# Patient Record
Sex: Female | Born: 1935 | Race: White | Hispanic: No | Smoking: Former smoker
Health system: Southern US, Community
[De-identification: ages and names within clinical notes are randomized; demographics above are authoritative.]

## PROBLEM LIST (undated history)

## (undated) ENCOUNTER — Emergency Department (HOSPITAL_COMMUNITY): Admission: EM | Payer: 59 | Source: Home / Self Care

## (undated) DIAGNOSIS — I480 Paroxysmal atrial fibrillation: Secondary | ICD-10-CM

## (undated) DIAGNOSIS — D649 Anemia, unspecified: Secondary | ICD-10-CM

## (undated) DIAGNOSIS — F0390 Unspecified dementia without behavioral disturbance: Secondary | ICD-10-CM

## (undated) DIAGNOSIS — T4145XA Adverse effect of unspecified anesthetic, initial encounter: Secondary | ICD-10-CM

## (undated) DIAGNOSIS — I10 Essential (primary) hypertension: Secondary | ICD-10-CM

## (undated) DIAGNOSIS — K5520 Angiodysplasia of colon without hemorrhage: Secondary | ICD-10-CM

## (undated) DIAGNOSIS — J9610 Chronic respiratory failure, unspecified whether with hypoxia or hypercapnia: Secondary | ICD-10-CM

## (undated) DIAGNOSIS — K219 Gastro-esophageal reflux disease without esophagitis: Secondary | ICD-10-CM

## (undated) DIAGNOSIS — J449 Chronic obstructive pulmonary disease, unspecified: Secondary | ICD-10-CM

## (undated) DIAGNOSIS — I6529 Occlusion and stenosis of unspecified carotid artery: Secondary | ICD-10-CM

## (undated) DIAGNOSIS — E119 Type 2 diabetes mellitus without complications: Secondary | ICD-10-CM

## (undated) DIAGNOSIS — I2699 Other pulmonary embolism without acute cor pulmonale: Secondary | ICD-10-CM

## (undated) DIAGNOSIS — T8859XA Other complications of anesthesia, initial encounter: Secondary | ICD-10-CM

## (undated) DIAGNOSIS — Z720 Tobacco use: Secondary | ICD-10-CM

## (undated) DIAGNOSIS — I5032 Chronic diastolic (congestive) heart failure: Secondary | ICD-10-CM

## (undated) DIAGNOSIS — K922 Gastrointestinal hemorrhage, unspecified: Secondary | ICD-10-CM

## (undated) DIAGNOSIS — E785 Hyperlipidemia, unspecified: Secondary | ICD-10-CM

## (undated) DIAGNOSIS — K5790 Diverticulosis of intestine, part unspecified, without perforation or abscess without bleeding: Secondary | ICD-10-CM

## (undated) DIAGNOSIS — R112 Nausea with vomiting, unspecified: Secondary | ICD-10-CM

## (undated) DIAGNOSIS — Z9889 Other specified postprocedural states: Secondary | ICD-10-CM

## (undated) DIAGNOSIS — T7840XA Allergy, unspecified, initial encounter: Secondary | ICD-10-CM

## (undated) HISTORY — DX: Tobacco use: Z72.0

## (undated) HISTORY — DX: Unspecified dementia, unspecified severity, without behavioral disturbance, psychotic disturbance, mood disturbance, and anxiety: F03.90

## (undated) HISTORY — DX: Essential (primary) hypertension: I10

## (undated) HISTORY — DX: Occlusion and stenosis of unspecified carotid artery: I65.29

## (undated) HISTORY — DX: Hyperlipidemia, unspecified: E78.5

## (undated) HISTORY — DX: Allergy, unspecified, initial encounter: T78.40XA

## (undated) HISTORY — DX: Diverticulosis of intestine, part unspecified, without perforation or abscess without bleeding: K57.90

---

## 1989-03-01 HISTORY — PX: CARDIAC CATHETERIZATION: SHX172

## 1997-11-01 ENCOUNTER — Ambulatory Visit (HOSPITAL_COMMUNITY): Admission: RE | Admit: 1997-11-01 | Discharge: 1997-11-01 | Payer: Self-pay | Admitting: *Deleted

## 1998-04-11 ENCOUNTER — Other Ambulatory Visit: Admission: RE | Admit: 1998-04-11 | Discharge: 1998-04-11 | Payer: Self-pay | Admitting: Family Medicine

## 1999-05-15 ENCOUNTER — Other Ambulatory Visit: Admission: RE | Admit: 1999-05-15 | Discharge: 1999-05-15 | Payer: Self-pay | Admitting: Family Medicine

## 2000-05-29 ENCOUNTER — Encounter: Payer: Self-pay | Admitting: Family Medicine

## 2000-05-29 ENCOUNTER — Encounter: Admission: RE | Admit: 2000-05-29 | Discharge: 2000-05-29 | Payer: Self-pay | Admitting: Family Medicine

## 2000-07-02 ENCOUNTER — Other Ambulatory Visit: Admission: RE | Admit: 2000-07-02 | Discharge: 2000-07-02 | Payer: Self-pay | Admitting: Family Medicine

## 2001-07-14 ENCOUNTER — Encounter: Admission: RE | Admit: 2001-07-14 | Discharge: 2001-07-14 | Payer: Self-pay | Admitting: Family Medicine

## 2001-07-14 ENCOUNTER — Encounter: Payer: Self-pay | Admitting: Family Medicine

## 2002-09-30 LAB — HM MAMMOGRAPHY

## 2002-10-06 ENCOUNTER — Encounter: Payer: Self-pay | Admitting: Family Medicine

## 2002-10-06 ENCOUNTER — Encounter: Admission: RE | Admit: 2002-10-06 | Discharge: 2002-10-06 | Payer: Self-pay | Admitting: Family Medicine

## 2003-09-13 ENCOUNTER — Encounter: Payer: Self-pay | Admitting: Family Medicine

## 2003-09-13 ENCOUNTER — Other Ambulatory Visit: Admission: RE | Admit: 2003-09-13 | Discharge: 2003-09-13 | Payer: Self-pay | Admitting: Family Medicine

## 2003-09-13 LAB — CONVERTED CEMR LAB: Pap Smear: NORMAL

## 2003-11-13 LAB — FECAL OCCULT BLOOD, GUAIAC: Fecal Occult Blood: NEGATIVE

## 2004-05-01 HISTORY — PX: ORIF TIBIA & FIBULA FRACTURES: SHX2131

## 2004-05-07 ENCOUNTER — Ambulatory Visit: Payer: Self-pay | Admitting: Internal Medicine

## 2004-05-07 ENCOUNTER — Inpatient Hospital Stay (HOSPITAL_COMMUNITY): Admission: EM | Admit: 2004-05-07 | Discharge: 2004-05-16 | Payer: Self-pay | Admitting: Emergency Medicine

## 2004-05-08 ENCOUNTER — Ambulatory Visit: Payer: Self-pay | Admitting: Internal Medicine

## 2004-05-09 ENCOUNTER — Ambulatory Visit: Payer: Self-pay | Admitting: Specialist

## 2004-05-10 ENCOUNTER — Encounter: Payer: Self-pay | Admitting: Internal Medicine

## 2004-06-11 ENCOUNTER — Ambulatory Visit: Payer: Self-pay | Admitting: Family Medicine

## 2004-06-14 ENCOUNTER — Ambulatory Visit: Payer: Self-pay | Admitting: Cardiology

## 2004-06-18 ENCOUNTER — Ambulatory Visit: Payer: Self-pay | Admitting: Family Medicine

## 2004-06-26 ENCOUNTER — Ambulatory Visit: Payer: Self-pay | Admitting: Family Medicine

## 2004-07-03 ENCOUNTER — Ambulatory Visit: Payer: Self-pay | Admitting: Family Medicine

## 2004-07-10 ENCOUNTER — Ambulatory Visit: Payer: Self-pay | Admitting: Family Medicine

## 2004-07-17 ENCOUNTER — Ambulatory Visit: Payer: Self-pay | Admitting: Family Medicine

## 2004-07-31 ENCOUNTER — Ambulatory Visit: Payer: Self-pay | Admitting: Family Medicine

## 2004-08-14 ENCOUNTER — Ambulatory Visit: Payer: Self-pay | Admitting: Family Medicine

## 2004-08-16 ENCOUNTER — Ambulatory Visit: Payer: Self-pay | Admitting: Family Medicine

## 2004-08-28 ENCOUNTER — Ambulatory Visit: Payer: Self-pay | Admitting: Family Medicine

## 2004-08-29 HISTORY — PX: ESOPHAGOGASTRODUODENOSCOPY: SHX1529

## 2004-08-29 HISTORY — PX: COLONOSCOPY: SHX174

## 2004-09-06 ENCOUNTER — Ambulatory Visit: Payer: Self-pay | Admitting: Internal Medicine

## 2004-09-17 ENCOUNTER — Ambulatory Visit: Payer: Self-pay | Admitting: Cardiology

## 2004-09-18 ENCOUNTER — Ambulatory Visit: Payer: Self-pay | Admitting: Internal Medicine

## 2004-09-25 ENCOUNTER — Ambulatory Visit: Payer: Self-pay | Admitting: Family Medicine

## 2004-09-28 ENCOUNTER — Ambulatory Visit: Payer: Self-pay

## 2004-10-09 ENCOUNTER — Ambulatory Visit: Payer: Self-pay | Admitting: Family Medicine

## 2004-10-10 ENCOUNTER — Ambulatory Visit: Payer: Self-pay | Admitting: Internal Medicine

## 2004-11-06 ENCOUNTER — Ambulatory Visit: Payer: Self-pay | Admitting: Family Medicine

## 2004-11-09 ENCOUNTER — Ambulatory Visit: Payer: Self-pay | Admitting: Internal Medicine

## 2004-11-20 ENCOUNTER — Ambulatory Visit: Payer: Self-pay | Admitting: Family Medicine

## 2004-11-27 ENCOUNTER — Ambulatory Visit (HOSPITAL_COMMUNITY): Admission: RE | Admit: 2004-11-27 | Discharge: 2004-11-27 | Payer: Self-pay | Admitting: Family Medicine

## 2004-11-27 ENCOUNTER — Ambulatory Visit: Payer: Self-pay | Admitting: Family Medicine

## 2004-11-28 ENCOUNTER — Ambulatory Visit (HOSPITAL_COMMUNITY): Admission: RE | Admit: 2004-11-28 | Discharge: 2004-11-28 | Payer: Self-pay | Admitting: Family Medicine

## 2004-12-24 ENCOUNTER — Ambulatory Visit: Payer: Self-pay | Admitting: Family Medicine

## 2005-01-21 ENCOUNTER — Ambulatory Visit: Payer: Self-pay | Admitting: Family Medicine

## 2005-02-04 ENCOUNTER — Ambulatory Visit: Payer: Self-pay | Admitting: Family Medicine

## 2005-02-11 ENCOUNTER — Ambulatory Visit: Payer: Self-pay | Admitting: Family Medicine

## 2005-02-26 ENCOUNTER — Ambulatory Visit: Payer: Self-pay | Admitting: Internal Medicine

## 2005-03-01 HISTORY — PX: SMALL BOWEL ENTEROSCOPY: SHX2415

## 2005-03-05 ENCOUNTER — Ambulatory Visit (HOSPITAL_COMMUNITY): Admission: RE | Admit: 2005-03-05 | Discharge: 2005-03-05 | Payer: Self-pay | Admitting: Internal Medicine

## 2005-03-05 ENCOUNTER — Ambulatory Visit: Payer: Self-pay | Admitting: Internal Medicine

## 2005-03-11 ENCOUNTER — Ambulatory Visit: Payer: Self-pay | Admitting: Family Medicine

## 2005-03-14 ENCOUNTER — Ambulatory Visit (HOSPITAL_COMMUNITY): Admission: RE | Admit: 2005-03-14 | Discharge: 2005-03-14 | Payer: Self-pay | Admitting: Internal Medicine

## 2005-03-28 ENCOUNTER — Ambulatory Visit: Payer: Self-pay | Admitting: Cardiology

## 2005-05-02 ENCOUNTER — Ambulatory Visit: Payer: Self-pay | Admitting: Family Medicine

## 2005-10-08 ENCOUNTER — Ambulatory Visit: Payer: Self-pay | Admitting: Cardiology

## 2005-10-08 ENCOUNTER — Ambulatory Visit: Payer: Self-pay

## 2005-10-10 ENCOUNTER — Ambulatory Visit (HOSPITAL_COMMUNITY): Admission: RE | Admit: 2005-10-10 | Discharge: 2005-10-10 | Payer: Self-pay | Admitting: Cardiology

## 2005-11-20 ENCOUNTER — Ambulatory Visit: Payer: Self-pay | Admitting: Family Medicine

## 2006-01-02 ENCOUNTER — Ambulatory Visit: Payer: Self-pay | Admitting: Family Medicine

## 2006-04-01 ENCOUNTER — Ambulatory Visit: Payer: Self-pay | Admitting: Family Medicine

## 2006-05-26 ENCOUNTER — Ambulatory Visit: Payer: Self-pay | Admitting: Family Medicine

## 2006-05-28 ENCOUNTER — Ambulatory Visit: Payer: Self-pay | Admitting: Family Medicine

## 2006-08-27 ENCOUNTER — Ambulatory Visit: Payer: Self-pay | Admitting: Family Medicine

## 2006-08-27 LAB — CONVERTED CEMR LAB
Basophils Absolute: 0 10*3/uL (ref 0.0–0.1)
Eosinophils Absolute: 0 10*3/uL (ref 0.0–0.6)
Lymphocytes Relative: 31.7 % (ref 12.0–46.0)
MCHC: 34.2 g/dL (ref 30.0–36.0)
MCV: 89.4 fL (ref 78.0–100.0)
Neutro Abs: 3.9 10*3/uL (ref 1.4–7.7)
Neutrophils Relative %: 58.2 % (ref 43.0–77.0)
Platelets: 433 10*3/uL — ABNORMAL HIGH (ref 150–400)
RBC: 4.28 M/uL (ref 3.87–5.11)

## 2006-09-05 ENCOUNTER — Ambulatory Visit: Payer: Self-pay | Admitting: Family Medicine

## 2006-09-12 ENCOUNTER — Ambulatory Visit: Payer: Self-pay | Admitting: Family Medicine

## 2006-09-12 LAB — CONVERTED CEMR LAB
BUN: 12 mg/dL (ref 6–23)
Creatinine, Ser: 0.8 mg/dL (ref 0.4–1.2)

## 2006-09-16 ENCOUNTER — Ambulatory Visit: Payer: Self-pay | Admitting: Cardiology

## 2006-09-26 ENCOUNTER — Ambulatory Visit: Payer: Self-pay | Admitting: Family Medicine

## 2006-10-15 ENCOUNTER — Ambulatory Visit: Payer: Self-pay

## 2006-11-25 ENCOUNTER — Ambulatory Visit: Payer: Self-pay | Admitting: Family Medicine

## 2006-11-25 DIAGNOSIS — E7800 Pure hypercholesterolemia, unspecified: Secondary | ICD-10-CM

## 2006-11-27 LAB — CONVERTED CEMR LAB
ALT: 11 units/L (ref 0–40)
Hgb A1c MFr Bld: 6.5 % — ABNORMAL HIGH (ref 4.6–6.0)
Total CHOL/HDL Ratio: 2.8
VLDL: 16 mg/dL (ref 0–40)

## 2007-05-08 ENCOUNTER — Ambulatory Visit: Payer: Self-pay | Admitting: Family Medicine

## 2007-10-22 ENCOUNTER — Encounter: Payer: Self-pay | Admitting: Family Medicine

## 2007-10-22 ENCOUNTER — Ambulatory Visit: Payer: Self-pay

## 2007-11-03 ENCOUNTER — Encounter: Payer: Self-pay | Admitting: Family Medicine

## 2007-11-03 DIAGNOSIS — I10 Essential (primary) hypertension: Secondary | ICD-10-CM | POA: Insufficient documentation

## 2007-11-03 DIAGNOSIS — G5760 Lesion of plantar nerve, unspecified lower limb: Secondary | ICD-10-CM | POA: Insufficient documentation

## 2007-11-03 DIAGNOSIS — I480 Paroxysmal atrial fibrillation: Secondary | ICD-10-CM

## 2007-11-03 DIAGNOSIS — H3530 Unspecified macular degeneration: Secondary | ICD-10-CM | POA: Insufficient documentation

## 2007-11-03 DIAGNOSIS — J309 Allergic rhinitis, unspecified: Secondary | ICD-10-CM

## 2007-11-03 DIAGNOSIS — E119 Type 2 diabetes mellitus without complications: Secondary | ICD-10-CM

## 2007-11-03 DIAGNOSIS — M810 Age-related osteoporosis without current pathological fracture: Secondary | ICD-10-CM

## 2007-11-04 ENCOUNTER — Ambulatory Visit: Payer: Self-pay | Admitting: Family Medicine

## 2007-11-04 DIAGNOSIS — Z87891 Personal history of nicotine dependence: Secondary | ICD-10-CM

## 2007-11-09 LAB — CONVERTED CEMR LAB
ALT: 16 units/L (ref 0–35)
AST: 15 units/L (ref 0–37)
Albumin: 4.3 g/dL (ref 3.5–5.2)
Alkaline Phosphatase: 50 units/L (ref 39–117)
BUN: 18 mg/dL (ref 6–23)
Basophils Relative: 0.6 % (ref 0.0–1.0)
Bilirubin, Direct: 0.1 mg/dL (ref 0.0–0.3)
Calcium: 9.7 mg/dL (ref 8.4–10.5)
Chloride: 109 meq/L (ref 96–112)
Creatinine, Ser: 0.9 mg/dL (ref 0.4–1.2)
Eosinophils Relative: 0.9 % (ref 0.0–5.0)
GFR calc Af Amer: 78 mL/min
GFR calc non Af Amer: 65 mL/min
HDL: 57.6 mg/dL (ref >39.0)
Lymphocytes Relative: 33.2 % (ref 12.0–46.0)
MCV: 90.6 fL (ref 78.0–100.0)
Microalb Creat Ratio: 11.6 mg/g (ref 0.0–30.0)
Monocytes Relative: 5.3 % (ref 3.0–12.0)
Neutrophils Relative %: 60 % (ref 43.0–77.0)
Platelets: 190 10*3/uL (ref 150–400)
RBC: 4.19 M/uL (ref 3.87–5.11)
Total CHOL/HDL Ratio: 2.8
Total Protein: 6.8 g/dL (ref 6.0–8.3)
VLDL: 18 mg/dL (ref 0–40)
WBC: 7.5 10*3/uL (ref 4.5–10.5)

## 2008-03-28 ENCOUNTER — Ambulatory Visit: Payer: Self-pay | Admitting: Family Medicine

## 2008-05-10 ENCOUNTER — Ambulatory Visit: Payer: Self-pay | Admitting: Family Medicine

## 2008-05-11 LAB — CONVERTED CEMR LAB: Hgb A1c MFr Bld: 6.9 % — ABNORMAL HIGH (ref 4.6–6.0)

## 2008-08-09 ENCOUNTER — Ambulatory Visit: Payer: Self-pay | Admitting: Family Medicine

## 2008-08-10 LAB — CONVERTED CEMR LAB
AST: 16 units/L (ref 0–37)
BUN: 14 mg/dL (ref 6–23)
Calcium: 9.2 mg/dL (ref 8.4–10.5)
Chloride: 106 meq/L (ref 96–112)
Cholesterol: 154 mg/dL (ref 0–200)
Creatinine, Ser: 0.8 mg/dL (ref 0.4–1.2)
Glucose, Bld: 126 mg/dL — ABNORMAL HIGH (ref 70–99)
Hgb A1c MFr Bld: 6.9 % — ABNORMAL HIGH (ref 4.6–6.0)
LDL Cholesterol: 81 mg/dL (ref 0–99)
Potassium: 3.9 meq/L (ref 3.5–5.1)
Triglycerides: 69 mg/dL (ref 0–149)
VLDL: 14 mg/dL (ref 0–40)

## 2008-10-25 ENCOUNTER — Ambulatory Visit: Payer: Self-pay

## 2008-10-27 ENCOUNTER — Encounter: Payer: Self-pay | Admitting: Family Medicine

## 2008-11-29 ENCOUNTER — Ambulatory Visit: Payer: Self-pay | Admitting: Cardiology

## 2008-11-29 ENCOUNTER — Inpatient Hospital Stay (HOSPITAL_COMMUNITY): Admission: EM | Admit: 2008-11-29 | Discharge: 2008-12-19 | Payer: Self-pay | Admitting: Emergency Medicine

## 2008-11-30 ENCOUNTER — Encounter: Payer: Self-pay | Admitting: Family Medicine

## 2008-11-30 ENCOUNTER — Ambulatory Visit: Payer: Self-pay | Admitting: Pulmonary Disease

## 2008-12-01 ENCOUNTER — Encounter (INDEPENDENT_AMBULATORY_CARE_PROVIDER_SITE_OTHER): Payer: Self-pay | Admitting: Orthopedic Surgery

## 2008-12-05 ENCOUNTER — Encounter (INDEPENDENT_AMBULATORY_CARE_PROVIDER_SITE_OTHER): Payer: Self-pay | Admitting: Internal Medicine

## 2008-12-05 ENCOUNTER — Ambulatory Visit: Payer: Self-pay | Admitting: Vascular Surgery

## 2008-12-08 ENCOUNTER — Encounter: Payer: Self-pay | Admitting: Family Medicine

## 2008-12-08 ENCOUNTER — Encounter: Payer: Self-pay | Admitting: Internal Medicine

## 2008-12-12 ENCOUNTER — Ambulatory Visit: Payer: Self-pay | Admitting: Infectious Disease

## 2008-12-12 ENCOUNTER — Encounter: Payer: Self-pay | Admitting: Family Medicine

## 2008-12-15 ENCOUNTER — Telehealth: Payer: Self-pay | Admitting: Family Medicine

## 2009-01-10 ENCOUNTER — Ambulatory Visit (HOSPITAL_COMMUNITY): Admission: RE | Admit: 2009-01-10 | Discharge: 2009-01-10 | Payer: Self-pay | Admitting: Internal Medicine

## 2009-01-23 ENCOUNTER — Ambulatory Visit: Payer: Self-pay | Admitting: Cardiology

## 2009-01-24 ENCOUNTER — Ambulatory Visit: Payer: Self-pay | Admitting: Family Medicine

## 2009-01-24 DIAGNOSIS — D649 Anemia, unspecified: Secondary | ICD-10-CM | POA: Insufficient documentation

## 2009-01-26 LAB — CONVERTED CEMR LAB
Basophils Relative: 0 % (ref 0.0–3.0)
Chloride: 104 meq/L (ref 96–112)
Eosinophils Relative: 0.8 % (ref 0.0–5.0)
Glucose, Bld: 232 mg/dL — ABNORMAL HIGH (ref 70–99)
HCT: 35.1 % — ABNORMAL LOW (ref 36.0–46.0)
Hemoglobin: 11.9 g/dL — ABNORMAL LOW (ref 12.0–15.0)
Lymphocytes Relative: 16.9 % (ref 12.0–46.0)
Monocytes Relative: 4.9 % (ref 3.0–12.0)
Neutro Abs: 5.5 10*3/uL (ref 1.4–7.7)
Phosphorus: 3.7 mg/dL (ref 2.3–4.6)
Potassium: 4.4 meq/L (ref 3.5–5.1)
RBC: 3.75 M/uL — ABNORMAL LOW (ref 3.87–5.11)
Sodium: 141 meq/L (ref 135–145)

## 2009-02-22 ENCOUNTER — Encounter: Payer: Self-pay | Admitting: Family Medicine

## 2010-04-06 IMAGING — CT CT CHEST W/O CM
2 of 4 series · 15 of 36 positions shown, 18 images · non-contrast
Comparison: Chest radiographs 12/07/2008 and chest CTA 12/04/2008.

CLINICAL DATA: Inability to wean from ventilator.  Follow-up
bilateral infiltrates.

CT CHEST WITHOUT CONTRAST
TECHNIQUE: Multidetector CT imaging of the chest was performed
following the standard protocol without IV contrast.

[Series 3: routine chest 5.0 st · axial · 0.60mm/px · z∈[-525,-252]mm · 12 of 66 slices shown, 15 images]
[im 5/66  mediastinal]
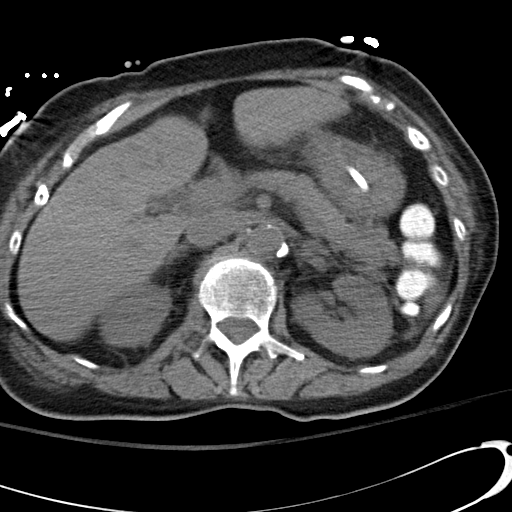
[im 5/66  lung]
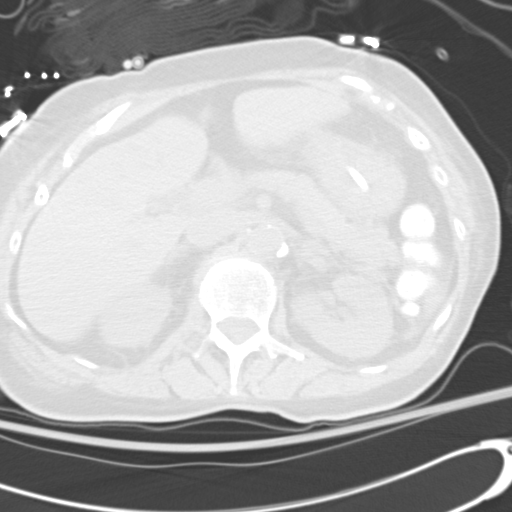
[im 10/66  lung]
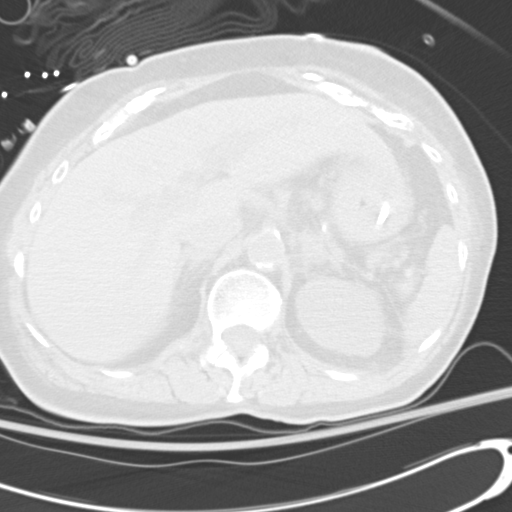
[im 14/66  lung]
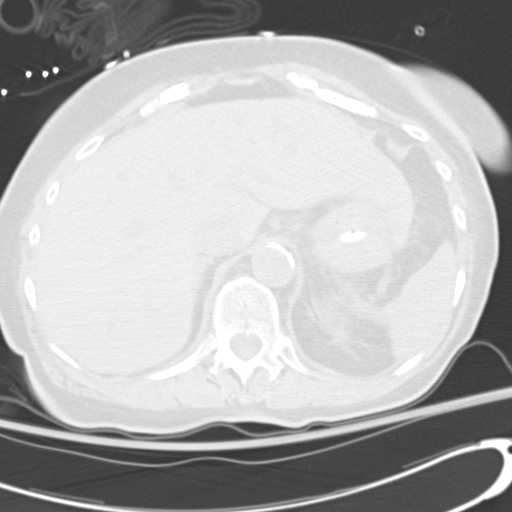
[im 19/66  lung]
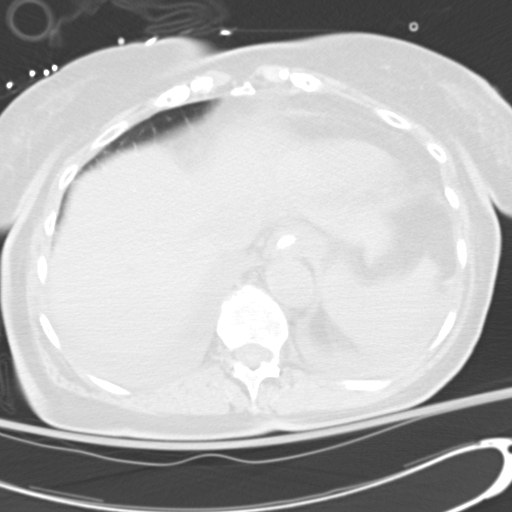
[im 24/66  mediastinal]
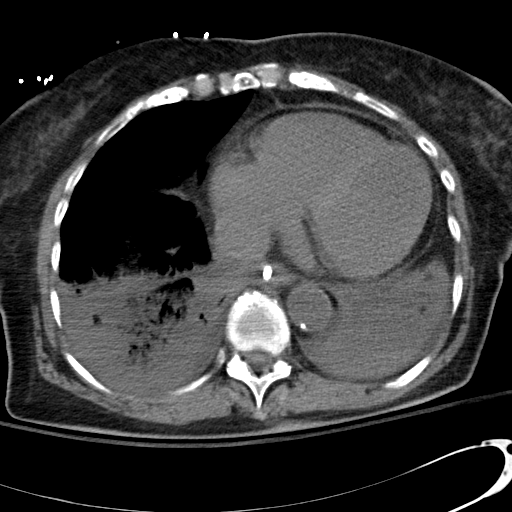
[im 24/66  lung]
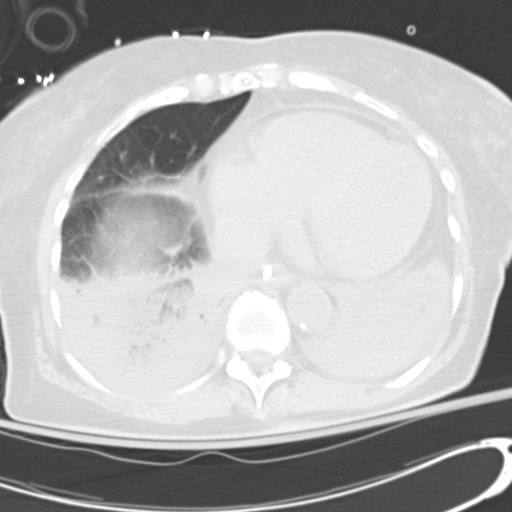
[im 28/66  lung]
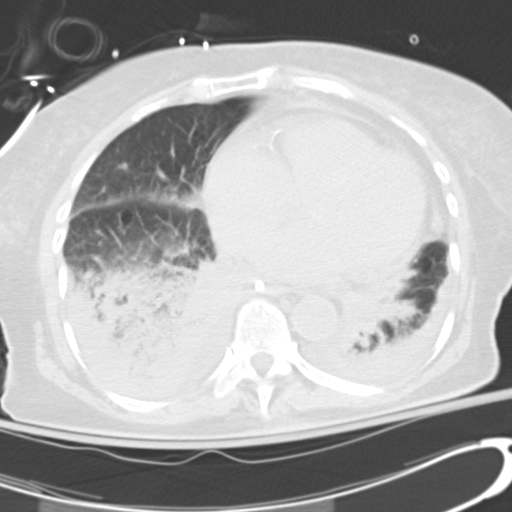
[im 38/66  lung]
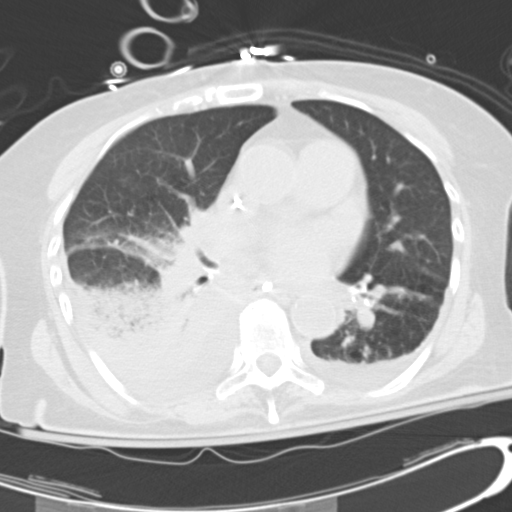
[im 42/66  lung]
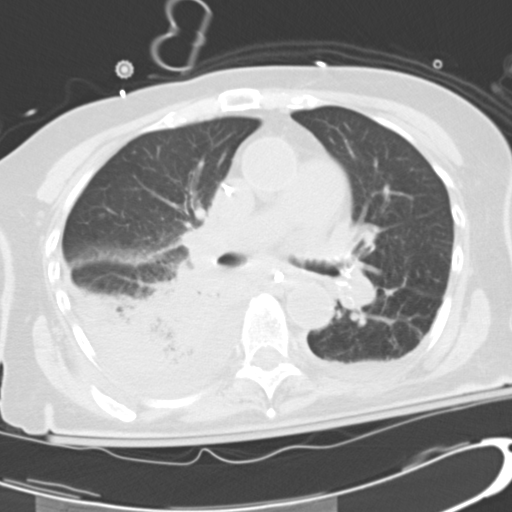
[im 47/66  mediastinal]
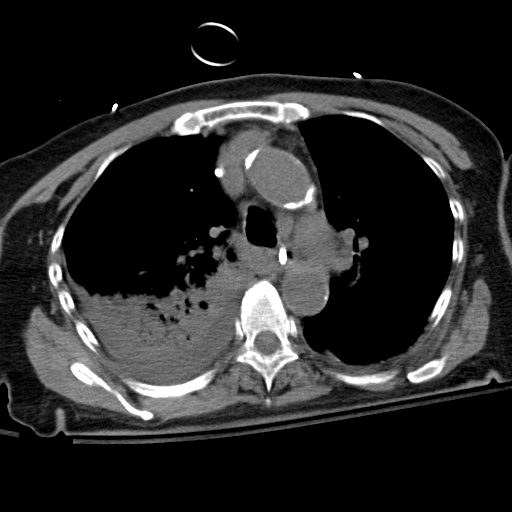
[im 47/66  lung]
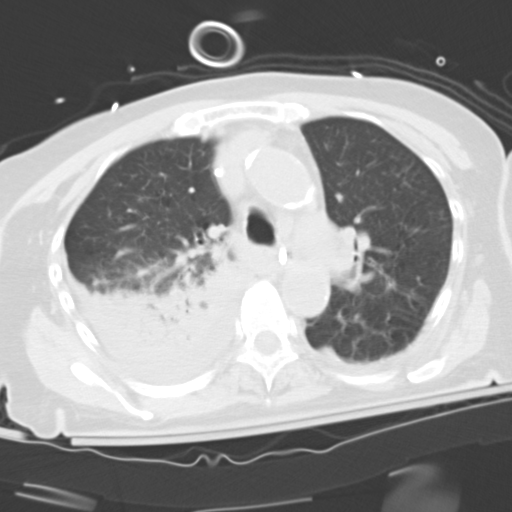
[im 52/66  lung]
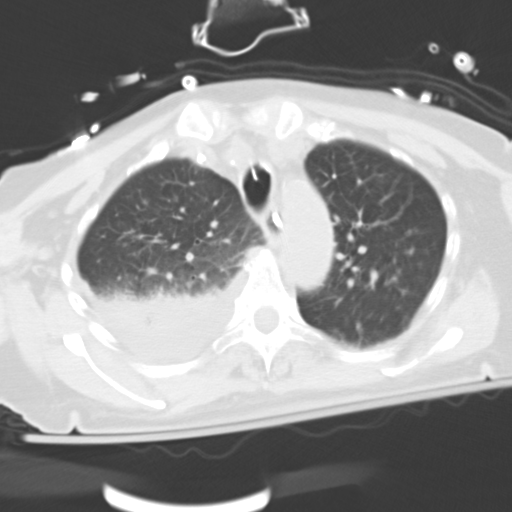
[im 56/66  lung]
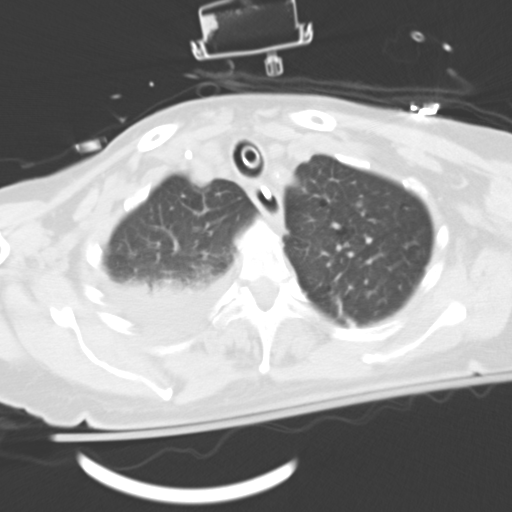
[im 61/66  lung]
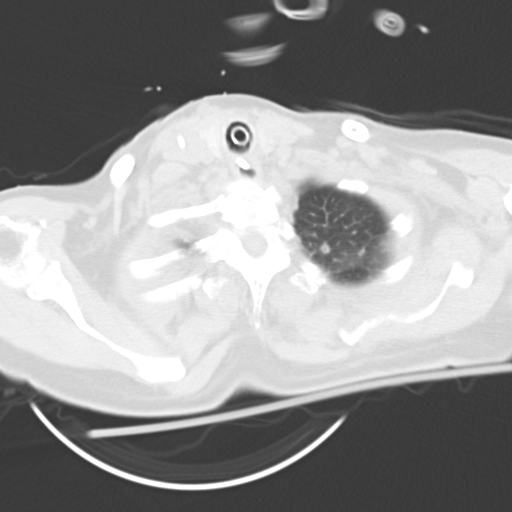

[Series 602: coronal chest · coronal · 0.60mm/px · 3 of 69 slices shown]
[im 14/69  lung]
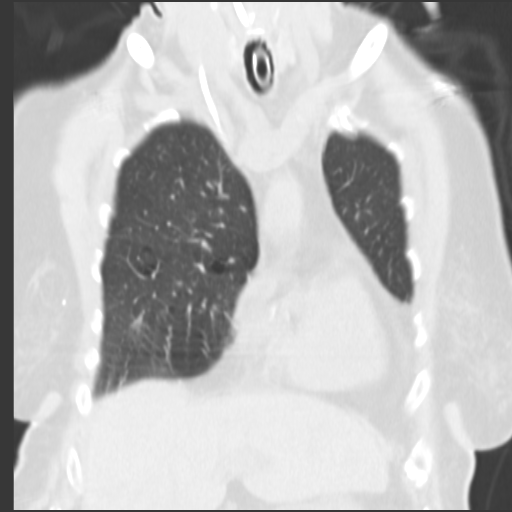
[im 28/69  lung]
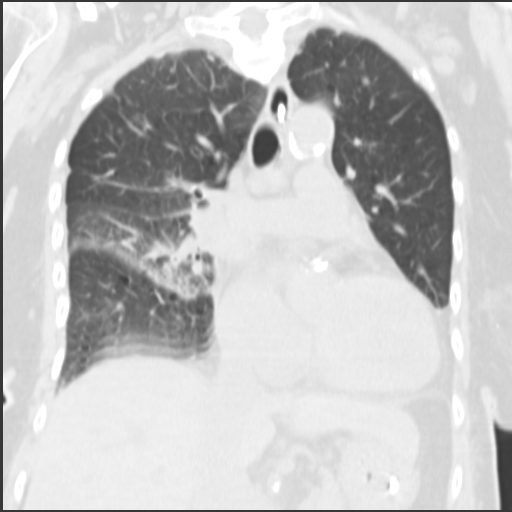
[im 41/69  lung]
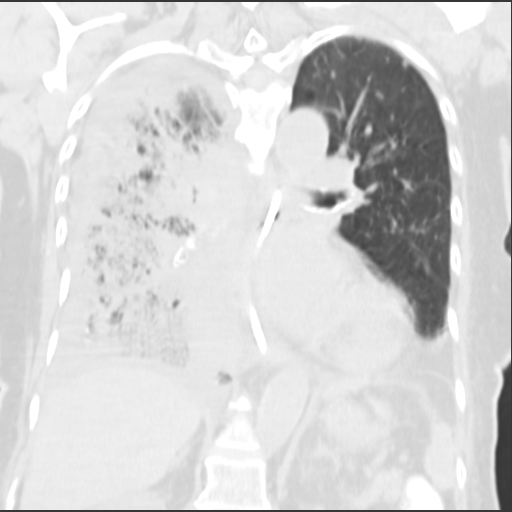

[15 of 36 positions shown; findings below may reference images not displayed]

FINDINGS: Right IJ central venous catheter projects to the SVC
right atrial junction.  Endotracheal tube tip is in the mid
trachea.  A feeding tube projects beyond the distal stomach, tip
not visualized.

13 mm precarinal lymph node is better seen on the current
examination due to the lack of beam hardening artifact from
contrast in the SVC.  It appears unchanged from the prior
examination.  Subcarinal nodal tissue appears stable.  There are
coronary artery calcifications.  Small right greater than left
pleural effusions and a tiny pericardial effusion appear stable.
There is no pneumothorax.

There has been little change in dense right lower lobe
consolidation aside from increased lucency in the consolidated lung
which may reflect reaeration or necrosis.  Minimal patchy opacity
peripherally in the right upper lobe on image 13 appears stable.
Dependent opacity in the left lower lobe appears stable.  No
endobronchial lesion is apparent.

Images through the upper abdomen demonstrate no adrenal mass.
There is an 11 mm low density lesion medially in the right kidney
which has not been previously imaged.  This is probably a cyst.
IMPRESSION: 1.  Persistent dense right lower lobe consolidation consistent with
pneumonia.  Small lucencies in the consolidated lung have slightly
increased and may reflect re-aeration or necrosis.  There is no
obvious lung abscess.
2.  No significant change in small right greater than left pleural
effusions.
3.  Nonspecific precarinal and subcarinal lymph nodes are unchanged
from the recent study and may be reactive.
4.  The support system appears adequately positioned.

## 2010-04-08 IMAGING — CR DG ABD PORTABLE 1V
1 series · 1 of 1 positions shown · non-contrast
Comparison: 12/10/2008

CLINICAL DATA: Evaluate feeding tube placement

ABDOMEN - 1 VIEW

[AP]
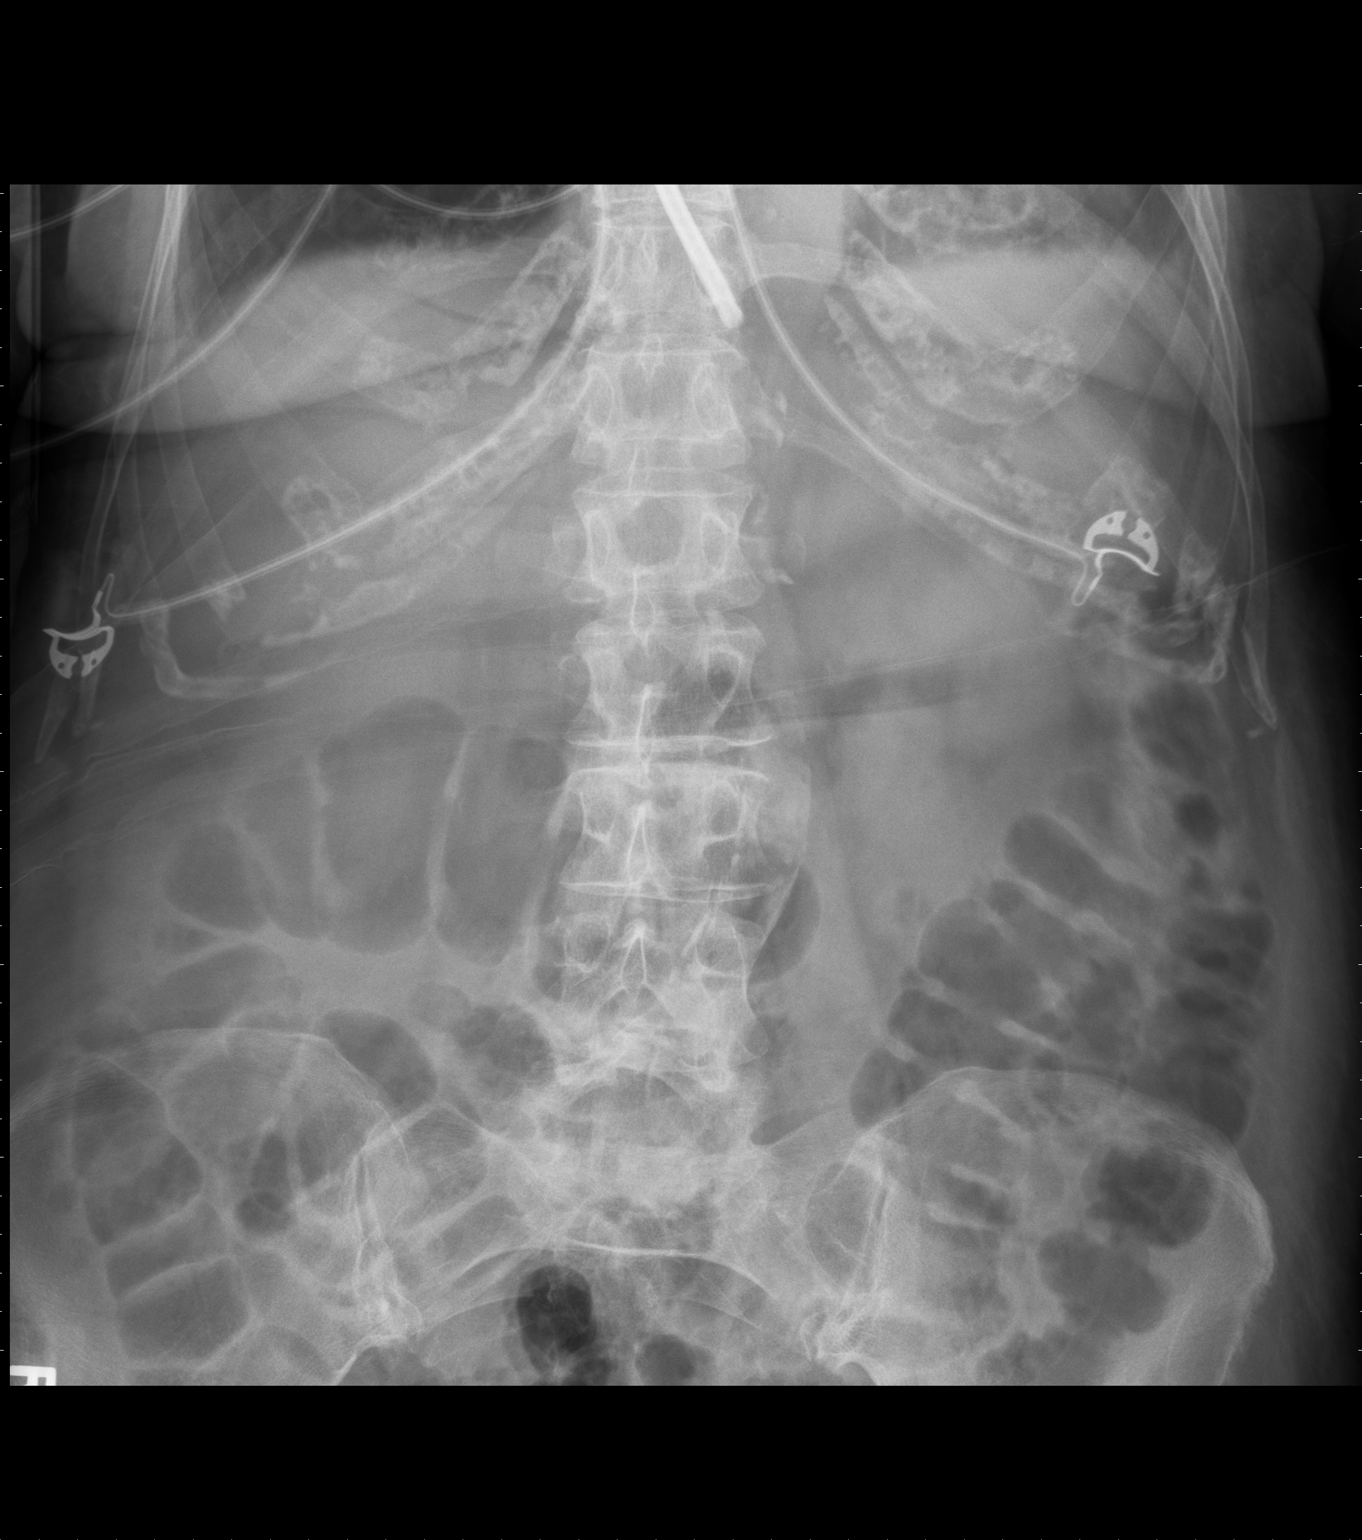

[1 of 1 positions shown; findings below may reference images not displayed]

FINDINGS: The feeding tube tip traverses the gastroesophageal
junction and should be advanced into the stomach.

There is gaseous distention of the large and small bowel loops.
There are no specific features to suggest bowel obstruction.
IMPRESSION: 1.  Feeding tube remains under advanced at the level of the GE
junction.

## 2010-04-09 IMAGING — CR DG CHEST 1V PORT
1 series · 1 of 1 positions shown · non-contrast
Comparison: 12/10/2008

CLINICAL DATA: Follow-up lung status

PORTABLE CHEST - 1 VIEW

[AP]
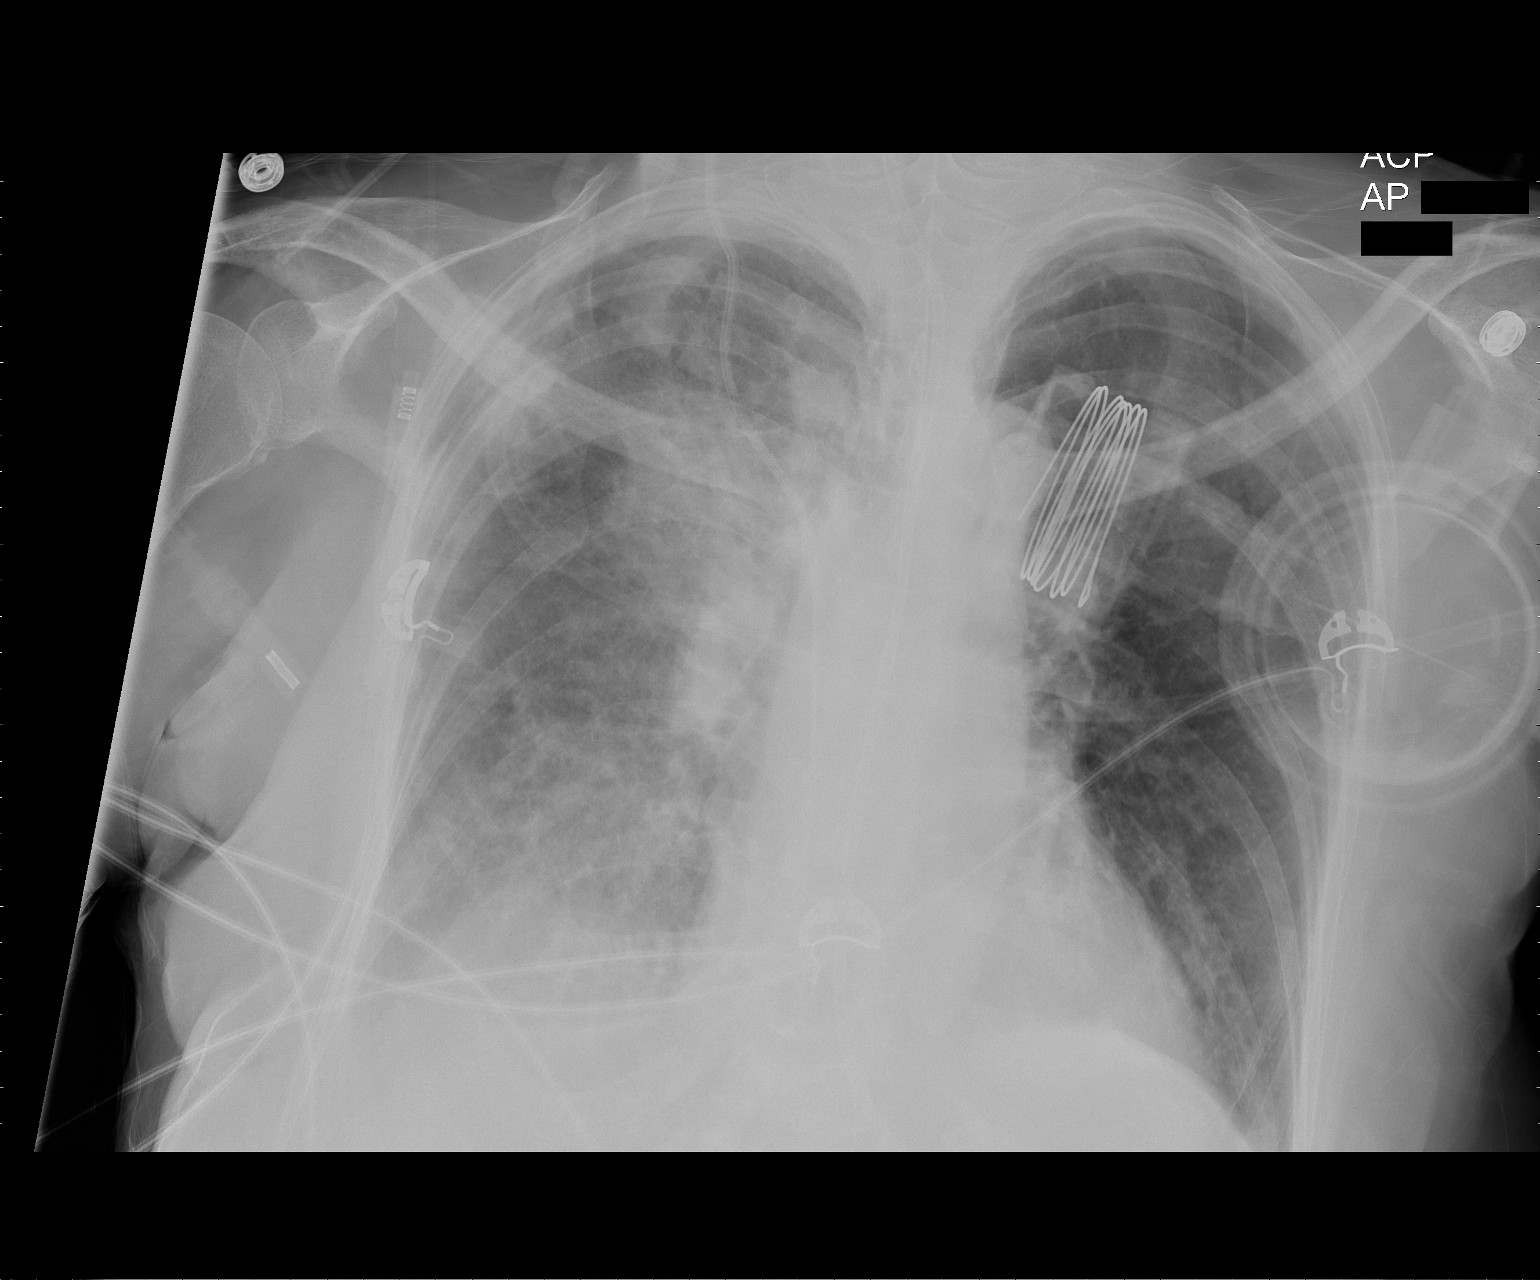

[1 of 1 positions shown; findings below may reference images not displayed]

FINDINGS: Asymmetrical density involving right lung is again
appreciated.  Interval increase in density in the medial aspect the
right upper lobe.
IMPRESSION: Increased density in the medial aspect right upper lobe.

## 2010-05-04 ENCOUNTER — Encounter (INDEPENDENT_AMBULATORY_CARE_PROVIDER_SITE_OTHER): Payer: Self-pay | Admitting: *Deleted

## 2010-05-23 ENCOUNTER — Telehealth: Payer: Self-pay | Admitting: Family Medicine

## 2010-07-11 ENCOUNTER — Ambulatory Visit
Admission: RE | Admit: 2010-07-11 | Discharge: 2010-07-11 | Payer: Self-pay | Source: Home / Self Care | Attending: Family Medicine | Admitting: Family Medicine

## 2010-07-11 ENCOUNTER — Other Ambulatory Visit: Payer: Self-pay | Admitting: Family Medicine

## 2010-07-11 ENCOUNTER — Other Ambulatory Visit
Admission: RE | Admit: 2010-07-11 | Discharge: 2010-07-11 | Payer: Self-pay | Source: Home / Self Care | Admitting: Family Medicine

## 2010-07-11 ENCOUNTER — Encounter: Payer: Self-pay | Admitting: Family Medicine

## 2010-07-11 LAB — CONVERTED CEMR LAB
Bacteria, UA: 0
Ketones, urine, test strip: NEGATIVE
Nitrite: NEGATIVE
Pap Smear: NORMAL
RBC / HPF: 0
Specific Gravity, Urine: <1.005
Urine crystals, microscopic: 0 /hpf
Urobilinogen, UA: 0.2

## 2010-07-11 LAB — HEPATIC FUNCTION PANEL
ALT: 12 U/L (ref 0–35)
AST: 18 U/L (ref 0–37)
Albumin: 4.4 g/dL (ref 3.5–5.2)
Alkaline Phosphatase: 45 U/L (ref 39–117)
Bilirubin, Direct: 0.1 mg/dL (ref 0.0–0.3)
Total Bilirubin: 0.5 mg/dL (ref 0.3–1.2)
Total Protein: 7.1 g/dL (ref 6.0–8.3)

## 2010-07-11 LAB — HM DIABETES FOOT EXAM

## 2010-07-11 LAB — RENAL FUNCTION PANEL
Albumin: 4.4 g/dL (ref 3.5–5.2)
BUN: 20 mg/dL (ref 6–23)
CO2: 30 mEq/L (ref 19–32)
Calcium: 9.1 mg/dL (ref 8.4–10.5)
Chloride: 104 mEq/L (ref 96–112)
Creatinine, Ser: 0.7 mg/dL (ref 0.4–1.2)
GFR: 80.56 mL/min (ref >60.00)
Glucose, Bld: 105 mg/dL — ABNORMAL HIGH (ref 70–99)
Phosphorus: 3.7 mg/dL (ref 2.3–4.6)
Potassium: 3.8 mEq/L (ref 3.5–5.1)
Sodium: 143 mEq/L (ref 135–145)

## 2010-07-11 LAB — CBC WITH DIFFERENTIAL/PLATELET
Basophils Absolute: 0 10*3/uL (ref 0.0–0.1)
Basophils Relative: 0.3 % (ref 0.0–3.0)
Eosinophils Absolute: 0 10*3/uL (ref 0.0–0.7)
Eosinophils Relative: 0.7 % (ref 0.0–5.0)
HCT: 41.4 % (ref 36.0–46.0)
Hemoglobin: 13.8 g/dL (ref 12.0–15.0)
Lymphocytes Relative: 36.7 % (ref 12.0–46.0)
Lymphs Abs: 2.5 10*3/uL (ref 0.7–4.0)
MCHC: 33.3 g/dL (ref 30.0–36.0)
MCV: 93.1 fl (ref 78.0–100.0)
Monocytes Absolute: 0.4 10*3/uL (ref 0.1–1.0)
Monocytes Relative: 5.7 % (ref 3.0–12.0)
Neutro Abs: 3.8 10*3/uL (ref 1.4–7.7)
Neutrophils Relative %: 56.6 % (ref 43.0–77.0)
Platelets: 167 10*3/uL (ref 150.0–400.0)
RBC: 4.45 Mil/uL (ref 3.87–5.11)
RDW: 13.1 % (ref 11.5–14.6)
WBC: 6.7 10*3/uL (ref 4.5–10.5)

## 2010-07-11 LAB — LIPID PANEL
Cholesterol: 177 mg/dL (ref 0–200)
HDL: 68.6 mg/dL (ref >39.00)
LDL Cholesterol: 95 mg/dL (ref 0–99)
Total CHOL/HDL Ratio: 3
Triglycerides: 67 mg/dL (ref 0.0–149.0)
VLDL: 13.4 mg/dL (ref 0.0–40.0)

## 2010-07-11 LAB — TSH: TSH: 1.11 u[IU]/mL (ref 0.35–5.50)

## 2010-07-11 LAB — HEMOGLOBIN A1C: Hgb A1c MFr Bld: 6.9 % — ABNORMAL HIGH (ref 4.6–6.5)

## 2010-07-14 DIAGNOSIS — E559 Vitamin D deficiency, unspecified: Secondary | ICD-10-CM | POA: Insufficient documentation

## 2010-07-19 ENCOUNTER — Ambulatory Visit
Admission: RE | Admit: 2010-07-19 | Discharge: 2010-07-19 | Payer: Self-pay | Source: Home / Self Care | Attending: Family Medicine | Admitting: Family Medicine

## 2010-07-19 ENCOUNTER — Encounter: Payer: Self-pay | Admitting: Family Medicine

## 2010-07-20 ENCOUNTER — Encounter: Payer: Self-pay | Admitting: Family Medicine

## 2010-07-20 ENCOUNTER — Encounter (INDEPENDENT_AMBULATORY_CARE_PROVIDER_SITE_OTHER): Payer: Self-pay | Admitting: *Deleted

## 2010-08-02 NOTE — Consult Note (Signed)
Summary: [facility]  [facility]   Imported By: [place] 08/01/2009 10:10:56  _____________________________________________________________________  External Attachment:    Type:   Image     Comment:   External Document

## 2010-08-02 NOTE — Miscellaneous (Signed)
Summary: Pap smear entry to flow sheet   Clinical Lists Changes  Observations: Added new observation of PAP SMEAR: normal (07/11/2010 8:26)      Preventive Care Screening  Pap Smear:    Date:  07/11/2010    Results:  normal

## 2010-08-02 NOTE — Miscellaneous (Addendum)
Summary: BONE DENSITY  Clinical Lists Changes  Orders: Added new Test order of T-Bone Densitometry [id number]) - Signed Added new Test order of T-Lumbar Vertebral Assessment [phone]) - Signed  Appended Document: BONE DENSITY  please pull old chart and return to me, thanks Chart is on your shelf in the in box. Thank you.Lewanda Rife LPN  August 14, 2010 5:09 PM   Clinical Lists Changes  Observations: Added new observation of BD LTFEMNECK: -2.6  (07/19/2010 18:58) Added new observation of BD L1-L4 T: -2.4  (07/19/2010 18:58) Added new observation of BONE DENSITY: osteoporosis std dev (07/19/2010 18:58)       Preventive Care Screening  T-score L femur:    Date:  07/19/2010    Results:  -2.6   T-score L-Spine:    Date:  07/19/2010    Results:  -2.4   Bone Density:    Date:  07/19/2010    Results:  osteoporosis std dev  Appended Document: BONE DENSITY please let ptknow that her dexa shows Osteoporosis- with slt improvement at spine and slight worsening at the hip will disc in detail at her follow up   Appended Document: BONE DENSITY Discussed today at her office visit with Dr. Milinda Antis.

## 2010-08-02 NOTE — Assessment & Plan Note (Signed)
Summary: CPX/CLE   Vital Signs:  Patient profile:   77 year old female Height:      65.75 inches Weight:      147 pounds BMI:     23.99 Temp:     97.4 degrees F oral Pulse rate:   72 / minute Pulse rhythm:   regular BP sitting:   128 / 76  (left arm) Cuff size:   regular  Vitals Entered By: Lewanda Rife LPN (07-29-2010 10:56 AM) CC: CPX  and frequency of urine LMP 1980   History of Present Illness: here for check up of chronic health problems and also to [facility] in [facility]n has urinary frequency  feels pretty good overall  lost her balance after she broke her ankle and now better  healed up good   wt is down 9 lb with bmi 23 not trying to loose wt but less appetite lately- does not know why  no new meds  has not been taking iron (but does stay cold all the time)  smoking status -- is still smoking 1/2 ppd -- is intent on quitting  is back at her own house - thinks she can quit now  wants to try chantix again   HT 128/76- well controlled  due for lipid check on statin and diet   DM due for check on glyburide her sugars have been good at home - sugars varies -- not entirely sure   OP- ? last dexa- due for one  on fosamax- over 5 years  ca and D-- has been taking that   pneumovax 07 Td 05 flu shot-- [facility] one at [facility] -- 9/25 zoster status-- had a shingle vaccine about 4 years ago -- it was at another office   colonosc ? 06 with BE and small bowel ft-- with AVMs  is having urinary frequency -- has to go all the time - feels like bladder has dropped no burning or odor  has been leaking and wearing a pad  urgency to go  no leaking with cough or sneeze   mam was 04  does not want those any more    Allergies: 1)  ! * ? Morphine 2)  ! * ? Fosamax 3)  ! Zithromax  Past History:  Past Medical History: Last updated: 01/24/2009 Carotid stenosis Hyperlipidemia Allergic rhinitis Atrial fibrillation Diabetes mellitus, type  II Hypertension Osteoporosis Tobacco abuse  ? dementia in hospital -- but ok at home   Past Surgical History: Last updated: 01/20/2009 4/09 carotid doppler - stable bilat stenosis 40-59 %(from 06) Dexa- OP (05/2000) CATH- clear (1990's) Tibia/ fibula fracture (05/2004) EGD- nodular gastritis (08/2004) Colonoscopy- polyps, tics, hems BE- diverticulosis (023/2006) Small bowel endo- AVMS (03/2005) CT chest- scarring (08/2006) Thyroid US- small lesions (09/2005)  Family History: Last updated: 2010-07-29 Father: died from cerebral hemorrhage, DM Mother: CVA, DM- deceaed Siblings: brother with DM, brother with CABG, brother died from lung cancer, brother with MI.  sister died from stomach and ? colon cancer, sistrer died from ? liver/ brain cancer, sister with heart problems, sister with glaucoma and DM son deg disc dz brother died of pancreatic cancer   Social History: Last updated: 01/24/2009 Marital Status: widowed Children: 1 son with DM living with son Occupation: retired current smoker (quit for a while with chantix)  Risk Factors: Smoking Status: quit (11/03/2007)  Family History: Father: died from cerebral hemorrhage, DM Mother: CVA, DM- deceaed Siblings: brother with DM, brother with CABG, brother died  from lung cancer, brother with MI.  sister died from stomach and ? colon cancer, sistrer died from ? liver/ brain cancer, sister with heart problems, sister with glaucoma and DM son deg disc dz brother died of pancreatic cancer   Review of Systems General:  Denies chills, fatigue, fever, and malaise. Eyes:  Denies blurring and eye irritation. CV:  Denies chest pain or discomfort, lightheadness, palpitations, and shortness of breath with exertion. Resp:  Denies cough and wheezing. GI:  Denies abdominal pain, change in bowel habits, indigestion, and nausea. GU:  Complains of urinary frequency; denies dysuria, hematuria, and urinary hesitancy. MS:  Denies muscle  weakness. Derm:  Denies itching, lesion(s), poor wound healing, and rash. Neuro:  Denies numbness and tingling. Psych:  mood is ok . Endo:  Denies cold intolerance, excessive thirst, excessive urination, and heat intolerance. Heme:  Denies abnormal bruising and bleeding.  Physical Exam  General:  elderly female in no distress  Head:  normocephalic, atraumatic, and no abnormalities observed.   Eyes:  vision grossly intact, pupils equal, pupils round, and pupils reactive to light.  no conjunctival pallor, injection or icterus  Ears:  R ear normal and L ear normal.   Nose:  no nasal discharge.   Mouth:  pharynx pink and moist.   Neck:  supple with full rom and no masses or thyromegally, no JVD or carotid bruit  Chest Wall:  No deformities, masses, or tenderness noted. Breasts:  No mass, nodules, thickening, tenderness, bulging, retraction, inflamation, nipple discharge or skin changes noted.   Lungs:  CTA with good air exch diffusely distant bs - with slt prolonged exp phase  no wheeze/rales or crackles  Heart:  RRR, no Murmur Abdomen:  Bowel sounds positive,abdomen soft and non-tender without masses, organomegaly or hernias noted. no renal bruits  Genitalia:  Normal introitus for age, no external lesions, no vaginal discharge, mucosa pink and moist, no vaginal or cervical lesions, no vaginal atrophy, no friaility or hemorrhage, normal uterus size and position, no adnexal masses or tenderness small cystocele noted  Msk:  No deformity or scoliosis noted of thoracic or lumbar spine.  no acute joint changes  Pulses:  plus one pedal pulse in R foot  Extremities:  No clubbing or cyanosis, left leg in a soft cast Neurologic:  sensation intact to light touch, gait normal, and DTRs symmetrical and normal.   Skin:  Intact without suspicious lesions or rashes Cervical Nodes:  No lymphadenopathy noted Axillary Nodes:  No palpable lymphadenopathy Inguinal Nodes:  No significant adenopathy Psych:   normal affect, talkative and pleasant   Diabetes Management Exam:    Foot Exam (with socks and/or shoes not present):       Sensory-Pinprick/Light touch:          Left medial foot (L-4): normal          Left dorsal foot (L-5): normal          Left lateral foot (S-1): normal          Right medial foot (L-4): normal          Right dorsal foot (L-5): normal          Right lateral foot (S-1): normal       Sensory-Monofilament:          Left foot: normal          Right foot: normal       Inspection:          Left foot:  normal          Right foot: normal       Nails:          Left foot: normal          Right foot: normal   Impression & Recommendations:  Problem # 1:  URINARY FREQUENCY (ICD-788.41) Assessment New neg ua - suspect overactive bladder and urge incontinence nl exam  trial of ditropan (disc poss side eff) and f/u Her updated medication list for this problem includes:    Ditropan Xl 10 Mg Xr24h-tab (Oxybutynin chloride) .[name]... 1 by mouth once daily for overactive bladder  Orders: UA Dipstick W/ Micro (manual) (04540) Prescription Created Electronically [phone])  Problem # 2:  ANEMIA (ICD-285.9) Assessment: Unchanged  no longer on iron hx of sb avms and prev surg lab and f/u The following medications were removed from the medication list:    Ferrousul 325 (65 Fe) Mg Tabs (Ferrous sulfate) .[name]... 1 by mouth once daily with food  Orders: Venipuncture (14782) TLB-Lipid Panel (80061-LIPID) TLB-Renal Function Panel (80069-RENAL) TLB-CBC Platelet - w/Differential (85025-CBCD) TLB-Hepatic/Liver Function Pnl (80076-HEPATIC) TLB-TSH (Thyroid Stimulating Hormone) (84443-TSH) T-Vitamin D (25-Hydroxy) (95621-30865) Prescription Created Electronically [phone])  Hgb: 11.9 (01/24/2009)   Hct: 35.1 (01/24/2009)   Platelets: 211.0 (01/24/2009) RBC: 3.75 (01/24/2009)   RDW: 13.5 (01/24/2009)   WBC: 7.2 (01/24/2009) MCV: 93.6 (01/24/2009)   MCHC: 33.8 (01/24/2009) TSH: 1.29  (11/04/2007)  Problem # 3:  TOBACCO USE (ICD-305.1) Assessment: Unchanged  plans to quit again px for chantix given- did well with this in past Her updated medication list for this problem includes:    Chantix Starting Month Pak 0.5 Mg X 11 & 1 Mg X 42 Tabs (Varenicline tartrate) .[name]... Take by mouth as directed    Chantix 1 Mg Tabs (Varenicline tartrate) .[name]... 1 by mouth two times a day after starter pack  Orders: Prescription Created Electronically [id number])  Problem # 4:  OSTEOPOROSIS (ICD-733.00) Assessment: Unchanged sched dexa check D level  stop fosamax- over 5 y had fx in 05 f/u after dexa to disc  The following medications were removed from the medication list:    Fosamax 70 Mg Tabs (Alendronate sodium) .[name]... Take one by mouth weekly  Orders: Venipuncture (84132) TLB-Lipid Panel (80061-LIPID) TLB-Renal Function Panel (80069-RENAL) TLB-CBC Platelet - w/Differential (85025-CBCD) TLB-Hepatic/Liver Function Pnl (80076-HEPATIC) TLB-TSH (Thyroid Stimulating Hormone) (84443-TSH) T-Vitamin D (25-Hydroxy) (44010-27253) Specimen Handling (66440) Radiology Referral (Radiology) Prescription Created Electronically [phone])  Problem # 5:  HYPERTENSION (ICD-401.9) Assessment: Unchanged  good control no change lab today  Her updated medication list for this problem includes:    Lisinopril 5 Mg Tabs (Lisinopril) .[name]... Take one by mouth daily    Norvasc 10 Mg Tabs (Amlodipine besylate) .[name]... Take one by mouth daily  Orders: Venipuncture (59563) TLB-Lipid Panel (80061-LIPID) TLB-Renal Function Panel (80069-RENAL) TLB-CBC Platelet - w/Differential (85025-CBCD) TLB-Hepatic/Liver Function Pnl (80076-HEPATIC) TLB-TSH (Thyroid Stimulating Hormone) (84443-TSH) T-Vitamin D (25-Hydroxy) (87564-33295) Prescription Created Electronically [phone])  BP today: 128/76 Prior BP: 130/80 (01/24/2009)  Labs Reviewed: K+: 4.4 (01/24/2009) Creat: : 1.0 (01/24/2009)   Chol: 154 (08/09/2008)    HDL: 59.5 (08/09/2008)   LDL: 81 (08/09/2008)   TG: 69 (08/09/2008)  Problem # 6:  DIABETES MELLITUS, TYPE II (ICD-250.00) Assessment: Unchanged  per pt good control AIC today  f/u to disc rev dm diet  Her updated medication list for this problem includes:    Glyburide 5 Mg Tabs (Glyburide) .[name]... 1 by mouth once daily    Lisinopril 5  Mg Tabs (Lisinopril) .[name]... Take one by mouth daily    Adult Aspirin Low Strength 81 Mg Tbdp (Aspirin) .[name]... Take one by mouth daily  Orders: Venipuncture (19147) TLB-Lipid Panel (80061-LIPID) TLB-Renal Function Panel (80069-RENAL) TLB-CBC Platelet - w/Differential (85025-CBCD) TLB-Hepatic/Liver Function Pnl (80076-HEPATIC) TLB-TSH (Thyroid Stimulating Hormone) (84443-TSH) T-Vitamin D (25-Hydroxy) (82956-21308) TLB-A1C / Hgb A1C (Glycohemoglobin) (83036-A1C) Prescription Created Electronically [phone])  Labs Reviewed: Creat: 1.0 (01/24/2009)    Reviewed HgBA1c results: 6.9 (08/09/2008)  6.9 (05/10/2008)  Problem # 7:  HYPERCHOLESTEROLEMIA (ICD-272.0) Assessment: Unchanged lab today on statin and diet update  Her updated medication list for this problem includes:    Zocor 20 Mg Tabs (Simvastatin) .[name]... 1 by mouth once daily  Orders: Venipuncture (69629) TLB-Lipid Panel (80061-LIPID) TLB-Renal Function Panel (80069-RENAL) TLB-CBC Platelet - w/Differential (85025-CBCD) TLB-Hepatic/Liver Function Pnl (80076-HEPATIC) TLB-TSH (Thyroid Stimulating Hormone) (84443-TSH) T-Vitamin D (25-Hydroxy) (52841-32440) Prescription Created Electronically [phone])  Problem # 8:  ROUTINE GYNECOLOGICAL EXAMINATION (ICD-V72.31)  this done today with pap  little cystocele some pelvic relaxation   Orders: Prescription Created Electronically [phone])  Complete Medication List: 1)  Glyburide 5 Mg Tabs (Glyburide) .[name].. 1 by mouth once daily 2)  Zocor 20 Mg Tabs (Simvastatin) .[name].. 1 by mouth once daily 3)  Lisinopril 5 Mg Tabs (Lisinopril) .... Take one by  mouth daily 4)  Calcium 600  .... Take one by mouth daily as directed 5)  Adult Aspirin Low Strength 81 Mg Tbdp (Aspirin) .... Take one by mouth daily 6)  Norvasc 10 Mg Tabs (Amlodipine besylate) .... Take one by mouth daily 7)  Onetouch Ultra Blue Strp (Glucose blood) .... Check blood sugar once daily and as needed 8)  Multivitamins Tabs (Multiple vitamin) .... Take 1 tablet by mouth once a day 9)  Ditropan Xl 10 Mg Xr24h-tab (Oxybutynin chloride) .[name].. 1 by mouth once daily for overactive bladder 10)  Chantix Starting Month Pak 0.5 Mg X 11 & 1 Mg X 42 Tabs (Varenicline tartrate) .... Take by mouth as directed 11)  Chantix 1 Mg Tabs (Varenicline tartrate) .[name].. 1 by mouth two times a day after starter pack  Patient Instructions: 1)  please stop the fosamax 2)  continue calcium and vitamin D 3)  get back on chantix and quit smoking  4)  labs today  5)  check blood sugar once daily  6)  we will schedule bone density test at check out  7)  follow up about 3 weeks after bone density test with me to review results and labs  8)  try ditropan for overactive bladder  Prescriptions: NORVASC 10 MG  TABS (AMLODIPINE BESYLATE) take one by mouth daily  #30 x 11   Entered and Authorized by:   Judith Part MD   Signed by:   Judith Part MD on 07/11/2010   Method used:   Electronically to        [facility] (retail)       [address]       [place], [place]  [address]       Ph: [phone]       Fax: [phone]   RxID:   [id number] LISINOPRIL 5 MG  TABS (LISINOPRIL) take one by mouth daily  #30 x 11   Entered and Authorized by:   Judith Part MD   Signed by:   Judith Part MD on 07/11/2010   Method used:   Electronically to        [facility] (retail)       [address]       [place], [place]  [id number]       Ph: [phone]       Fax: [phone]   RxID:   [id number] ZOCOR 20 MG TABS (SIMVASTATIN) 1 by mouth once  daily  #30 x 11   Entered and Authorized by:   Judith Part MD   Signed by:   Judith Part MD on 07/11/2010   Method used:   Electronically to        [facility] (retail)       [address]       [place], [place]  [id number]       Ph: [phone]       Fax: [phone]   RxID:   [id number] GLYBURIDE 5 MG TABS (GLYBURIDE) 1 by mouth once daily  #30 x 11   Entered and Authorized by:   Judith Part MD   Signed by:   Judith Part MD on 07/11/2010   Method used:   Electronically to        [facility] (retail)       [address]       [place], [place]  [id number]       Ph: [phone]       Fax: [phone]   RxID:   [id number] CHANTIX 1 MG TABS (VARENICLINE TARTRATE) 1 by mouth two times a day after starter pack  #60 x 4   Entered and Authorized by:   Judith Part MD   Signed by:   Judith Part MD on 07/11/2010   Method used:   Electronically to        [facility] (retail)       [address]       [place], [place]  [id number]       Ph: [phone]       Fax: [phone]   RxID:   [id number] CHANTIX STARTING MONTH PAK 0.5 MG X 11 & 1 MG X 42 TABS (VARENICLINE TARTRATE) take by mouth as directed  #1 pack x 0   Entered and Authorized by:   Judith Part MD   Signed by:   Judith Part MD on 07/11/2010   Method used:   Electronically to        [facility] (retail)       [address]       [place], [place]  [address]       Ph: [phone]       Fax: [phone]   RxID:   [id number] DITROPAN XL 10 MG XR24H-TAB (OXYBUTYNIN CHLORIDE) 1 by mouth once daily for overactive bladder  #30 x 11   Entered and Authorized by:   Judith Part MD   Signed by:   Judith Part MD on 07/11/2010   Method used:   Electronically to        [facility] (retail)       [address]       [place], [place]  [address]        Ph: [phone]       Fax: [phone]   RxID:   [id number]    Orders Added: 1)  UA Dipstick W/ Micro (manual) [81000] 2)  Venipuncture [89211] 3)  TLB-Lipid Panel [80061-LIPID] 4)  TLB-Renal Function Panel [80069-RENAL] 5)  TLB-CBC Platelet - w/Differential [85025-CBCD] 6)  TLB-Hepatic/Liver Function Pnl [80076-HEPATIC] 7)  TLB-TSH (Thyroid Stimulating Hormone) [84443-TSH] 8)  T-Vitamin D (25-Hydroxy) [47829-56213] 9)  Specimen Handling [99000] 10)  Radiology Referral [Radiology] 11)  TLB-A1C / Hgb A1C (Glycohemoglobin) [83036-A1C] 12)  Est. Patient Level IV [08657] 13)  Prescription Created Electronically [Q4696]   Immunization History:  Influenza Immunization History:    Influenza:  fluvax 3+ (03/28/2010)  Zostavax History:    Zostavax # 1:  zostavax (07/01/2006)   Immunization History:  Influenza Immunization History:    Influenza:  Fluvax 3+ (03/28/2010)  Zostavax History:    Zostavax # 1:  Zostavax (07/01/2006)  Current Allergies (reviewed today): ! * ? MORPHINE ! * ? FOSAMAX ! [facility]  Laboratory Results   Urine Tests  Date/Time Received: July 11, 2010 11:00 AM  Date/Time Reported: July 11, 2010 11:01 AM   Routine Urinalysis   Color: yellow Appearance: Clear Glucose: negative   (Normal Range: Negative) Bilirubin: negative   (Normal Range: Negative) Ketone: negative   (Normal Range: Negative) Spec. Gravity: <1.005   (Normal Range: 1.003-1.035) Blood: small   (Normal Range: Negative) pH: 5.0   (Normal Range: 5.0-8.0) Protein: trace   (Normal Range: Negative) Urobilinogen: 0.2   (Normal Range: 0-1) Nitrite: negative   (Normal Range: Negative) Leukocyte Esterace: negative   (Normal Range: Negative)  Urine Microscopic WBC/HPF: 0 RBC/HPF: 0 Bacteria/HPF: 0 Mucous/HPF: few Epithelial/HPF: 0-1 Crystals/HPF: 0 Casts/LPF: 0 Yeast/HPF: 0 Other: 0

## 2010-08-02 NOTE — Progress Notes (Signed)
Summary: REFILLS  Phone Note Refill Request Message from:  Fax from Pharmacy on May 23, 2010 9:27 AM  Refills Requested: Medication #1:  ZOCOR 20 MG TABS 1 by mouth once daily   Supply Requested: 1 month   Notes: Patient not seen in over a year  Medication #2:  NORVASC 10 MG  TABS take one by mouth daily   Supply Requested: 1 month   Notes: Patient not seen in over a year  [facility] [phone]   Method Requested: Electronic Initial call taken by: Benny Lennert CMA Duncan Dull),  May 23, 2010 9:27 AM  Follow-up for Phone Call        make f/u appt please--then can call in px px written on EMR for call in  Follow-up by: Judith Part MD,  May 23, 2010 1:33 PM  Additional Follow-up for Phone Call Additional follow up Details #1::        Pt already has appt scheduled for CPX on 07/11/10 at 10:30am. Medication phoned to [facility] [phone] pharmacy as instructed. Lewanda Rife LPN  May 23, 2010 4:20 PM     Prescriptions: NORVASC 10 MG  TABS (AMLODIPINE BESYLATE) take one by mouth daily  #30 x 1   Entered by:   Lewanda Rife LPN   Authorized by:   Judith Part MD   Signed by:   Lewanda Rife LPN on 28/41/3244   Method used:   Telephoned to ...         RxID:   [id number] ZOCOR 20 MG TABS (SIMVASTATIN) 1 by mouth once daily  #30 x 1   Entered by:   Lewanda Rife LPN   Authorized by:   Judith Part MD   Signed by:   Lewanda Rife LPN on 42/59/5638   Method used:   Telephoned to ...         RxID:   [id number]

## 2010-08-02 NOTE — Letter (Signed)
Summary: Results Follow up Letter  [place] [facility]  [address] [place], [place] [address]   Phone: [phone]  Fax: [phone]    07/20/2010 MRN: [id number]  [name] [address] [place], [place]  [address]  Dear [name],  The following are the results of your recent test(s):  Test         Result    Pap Smear:        Normal __X___  Not Normal _____ Comments: ______________________________________________________ Cholesterol: LDL(Bad cholesterol):         Your goal is less than:         HDL (Good cholesterol):       Your goal is more than: Comments:  ______________________________________________________ Mammogram:        Normal _____  Not Normal _____ Comments:  ___________________________________________________________________ Hemoccult:        Normal _____  Not normal _______ Comments:    _____________________________________________________________________ Other Tests:    We routinely do not discuss normal results over the telephone.  If you desire a copy of the results, or you have any questions about this information we can discuss them at your next office visit.   Sincerely,       Dr. Roxy Manns

## 2010-08-02 NOTE — Miscellaneous (Signed)
Summary: med list update- test strips  Medications Added ONETOUCH ULTRA BLUE  STRP (GLUCOSE BLOOD) check blood sugar once daily and as needed       Clinical Lists Changes  Medications: Changed medication from * DIABETES TEST STRIPS to check sugar once daily and as needed 250.0 to ONETOUCH ULTRA BLUE  STRP (GLUCOSE BLOOD) check blood sugar once daily and as needed     Prior Medications: GLYBURIDE 5 MG TABS (GLYBURIDE) 1 by mouth once daily ZOCOR 20 MG TABS (SIMVASTATIN) 1 by mouth once daily LISINOPRIL 5 MG  TABS (LISINOPRIL) take one by mouth daily CALCIUM 600 () take one by mouth daily as directed ADULT ASPIRIN LOW STRENGTH 81 MG  TBDP (ASPIRIN) take one by mouth daily NORVASC 10 MG  TABS (AMLODIPINE BESYLATE) take one by mouth daily FOSAMAX 70 MG  TABS (ALENDRONATE SODIUM) take one by mouth weekly ONETOUCH ULTRA BLUE  STRP (GLUCOSE BLOOD) check blood sugar once daily and as needed MULTIVITAMINS   TABS (MULTIPLE VITAMIN) Take 1 tablet by mouth once a day FERROUSUL 325 (65 FE) MG TABS (FERROUS SULFATE) 1 by mouth once daily with food Current Allergies: ! * ? MORPHINE ! * ? FOSAMAX ! ZITHROMAX

## 2010-08-17 ENCOUNTER — Ambulatory Visit (INDEPENDENT_AMBULATORY_CARE_PROVIDER_SITE_OTHER): Payer: MEDICARE | Admitting: Family Medicine

## 2010-08-21 ENCOUNTER — Encounter: Payer: Self-pay | Admitting: Family Medicine

## 2010-08-21 ENCOUNTER — Ambulatory Visit (INDEPENDENT_AMBULATORY_CARE_PROVIDER_SITE_OTHER): Payer: MEDICARE | Admitting: Family Medicine

## 2010-08-21 DIAGNOSIS — F17200 Nicotine dependence, unspecified, uncomplicated: Secondary | ICD-10-CM

## 2010-08-21 DIAGNOSIS — M810 Age-related osteoporosis without current pathological fracture: Secondary | ICD-10-CM

## 2010-08-21 DIAGNOSIS — E559 Vitamin D deficiency, unspecified: Secondary | ICD-10-CM

## 2010-08-28 NOTE — Assessment & Plan Note (Signed)
Summary: f/u bone density at [place]   Vital Signs:  Patient profile:   77 year old female Weight:      152.25 pounds BMI:     24.85 Temp:     98.0 degrees F oral Pulse rate:   76 / minute Pulse rhythm:   regular BP sitting:   144 / 70  (left arm) Cuff size:   regular  Vitals Entered By: Sydell Axon LPN (August 21, 2010 8:16 AM) CC: Follow-up on bone density   History of Present Illness: here for f/u of dexa with OP   wt is up 5 lb  144/70  FN T score is -2.6 a bit worse  LS T score is -2.4 a bit better   has fractured 2 ankles   side eff to fosamax in past--long time ago - ? what it was -- ? if GI    smoking status  went on chantix - and tomorrow last day before she quit    D level 19 in jan -- on high dose weekly therapy -- doing ok with that   no hx of jaw tumor and does not have any teeth   ? if she has lost some height          Allergies: 1)  ! * ? Morphine 2)  ! * ? Fosamax 3)  ! Zithromax  Past History:  Past Medical History: Last updated: 01/24/2009 Carotid stenosis Hyperlipidemia Allergic rhinitis Atrial fibrillation Diabetes mellitus, type II Hypertension Osteoporosis Tobacco abuse  ? 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  sister died from stomach and ? colon cancer, sistrer died from ? liver/ brain cancer, sister with heart problems, sister with  glaucoma and DM son deg disc dz brother died of pancreatic cancer   Social History: Last updated: 01/24/2009 Marital Status: widowed Children: 1 son with DM living with son Occupation: retired current smoker (quit for a while with chantix)  Risk Factors: Smoking Status: quit (11/03/2007)  Review of Systems General:  Denies fever, loss of appetite, and malaise. Eyes:  Denies blurring and eye pain. CV:  Denies chest pain or discomfort and lightheadness. Resp:  Denies cough and wheezing. GI:  Denies abdominal pain and indigestion. MS:  Complains of stiffness; denies joint redness, joint swelling, cramps, and muscle weakness. Derm:  Denies lesion(s) and rash. Neuro:  Denies numbness and tingling. Endo:  Denies cold intolerance and heat intolerance. Heme:  Denies abnormal bruising and bleeding.  Physical Exam  General:  elderly female in no distress  Head:  normocephalic, atraumatic, and no abnormalities observed.   Eyes:  vision grossly intact, pupils equal, pupils round, and pupils reactive to light.  no conjunctival pallor, injection or icterus  Neck:  supple with full rom and no masses or thyromegally, no JVD or carotid bruit  Lungs:  CTA with good air exch diffusely distant bs - with slt prolonged exp phase  no wheeze/rales or crackles  Heart:  RRR, no Murmur Msk:  kyphosis noted  no new joint changes  Extremities:  No clubbing or cyanosis, left leg in a soft cast Skin:  Intact without suspicious lesions or rashes Cervical Nodes:  No lymphadenopathy noted Psych:  normal affect, talkative and pleasant    Impression & Recommendations:  Problem # 1:  OSTEOPOROSIS (ICD-733.00) Assessment Unchanged  this was disc and the dexa was rev in detail  we disc plan ? what rxn to fosamax was - pt really unsure (? was actually cost issue ) -- will try it again  if GI side eff poss reclast is opt or evista (if quits smoking) or prolia  also replace vit d-- pt does not remember  being told to take this -- rev phone notes with her  will start that and fosamax quit date tomorrow exercise rec  dexa 2 y  given handout from [facility] on OP Her updated medication list for this problem includes:    Ergocalciferol 50000 Unit Caps (Ergocalciferol) .[name]... 1 by mouth once weekly for 12 weeks    Fosamax 70 Mg Tabs (Alendronate sodium) .[name]... 1 by mouth once weekly as directed  Orders: Prescription Created Electronically [phone])  Problem # 2:  TOBACCO USE (ICD-305.1) Assessment: Unchanged doing ok on chantix so far and quit date is tomorrow wished her luck Her updated medication list for this problem includes:    Chantix Starting Month Pak 0.5 Mg X 11 & 1 Mg X 42 Tabs (Varenicline tartrate) .[name]... Take by mouth as directed    Chantix 1 Mg Tabs (Varenicline tartrate) .[name]... 1 by mouth two times a day after starter pack  Complete Medication List: 1)  Glyburide 5 Mg Tabs (Glyburide) .[name].. 1 by mouth once daily 2)  Zocor 20 Mg Tabs (Simvastatin) .[name].. 1 by mouth once daily 3)  Lisinopril 5 Mg Tabs (Lisinopril) .... Take one by mouth daily 4)  Calcium 600  .... Take one by mouth daily as directed 5)  Adult Aspirin Low Strength 81 Mg Tbdp (Aspirin) .... Take one by mouth daily 6)  Norvasc 10 Mg Tabs (Amlodipine besylate) .... Take one by mouth daily 7)  Onetouch Ultra Blue Strp (Glucose blood) .... Check blood sugar once daily and as needed 8)  Multivitamins Tabs (Multiple vitamin) .... Take 1 tablet by mouth once a day 9)  Ditropan Xl 10 Mg Xr24h-tab (Oxybutynin chloride) .[name].. 1 by mouth once daily for overactive bladder 10)  Chantix Starting Month Pak 0.5 Mg X 11 & 1 Mg X 42 Tabs (Varenicline tartrate) .... Take by mouth as directed 11)  Chantix 1 Mg Tabs (Varenicline tartrate) .[name].. 1 by mouth two times a day after starter pack 12)  Ergocalciferol 50000 Unit Caps (Ergocalciferol) .[name].. 1 by mouth once weekly for 12 weeks 13)  Fosamax 70 Mg Tabs (Alendronate sodium) .[name].. 1 by mouth once  weekly as directed  Patient Instructions: 1)  read the handout I gave you on osteoporosis 2)  continue calcium 1200-1500 mg per day if you can  3)  you need to pick up your vitamin D (ergocalciferol) from the pharmacy and start taking it once weekly -- if they don't have it have them call me so we can re do the px  4)  schedule labs in 12 weeks for vit D level for vit D def  5)  take the fosamax 70 mg once weekly as directed and if any side effects stop it and call to let me know  Prescriptions: ERGOCALCIFEROL 50000 UNIT CAPS (ERGOCALCIFEROL) 1 by mouth once weekly  for 12 weeks  #12 x 0   Entered and Authorized by:   Judith Part MD   Signed by:   Judith Part MD on 08/21/2010   Method used:   Electronically to        [facility] (retail)       [address]       [place], [place]  [id number]       Ph: [phone]       Fax: [phone]   RxID:   [id number] FOSAMAX 70 MG TABS (ALENDRONATE SODIUM) 1 by mouth once weekly as directed  #4 x 11   Entered and Authorized by:   Judith Part MD   Signed by:   Judith Part MD on 08/21/2010   Method used:   Electronically to        [facility] (retail)       [address]       [place], [place]  [id number]       Ph: [phone]       Fax: [phone]   RxID:   [id number]    Orders Added: 1)  Prescription Created Electronically [G8553] 2)  Est. Patient Level III [09323]    Current Allergies (reviewed today): ! * ? MORPHINE ! * ? FOSAMAX ! ZITHROMAX

## 2010-10-08 LAB — CK TOTAL AND CKMB (NOT AT ARMC)
CK, MB: 2.6 ng/mL (ref 0.3–4.0)
Total CK: 1012 U/L — ABNORMAL HIGH (ref 7–177)
Total CK: 75 U/L (ref 7–177)

## 2010-10-08 LAB — BASIC METABOLIC PANEL
BUN: 16 mg/dL (ref 6–23)
BUN: 21 mg/dL (ref 6–23)
BUN: 22 mg/dL (ref 6–23)
BUN: 23 mg/dL (ref 6–23)
BUN: 27 mg/dL — ABNORMAL HIGH (ref 6–23)
CO2: 36 mEq/L — ABNORMAL HIGH (ref 19–32)
CO2: 39 mEq/L — ABNORMAL HIGH (ref 19–32)
CO2: 27 mEq/L (ref 19–32)
CO2: 27 mEq/L (ref 19–32)
CO2: 27 mEq/L (ref 19–32)
CO2: 30 mEq/L (ref 19–32)
CO2: 34 mEq/L — ABNORMAL HIGH (ref 19–32)
CO2: 37 mEq/L — ABNORMAL HIGH (ref 19–32)
CO2: 39 mEq/L — ABNORMAL HIGH (ref 19–32)
CO2: 39 mEq/L — ABNORMAL HIGH (ref 19–32)
CO2: 40 mEq/L — ABNORMAL HIGH (ref 19–32)
Calcium: 8.5 mg/dL (ref 8.4–10.5)
Calcium: 9.1 mg/dL (ref 8.4–10.5)
Calcium: 7.6 mg/dL — ABNORMAL LOW (ref 8.4–10.5)
Calcium: 7.9 mg/dL — ABNORMAL LOW (ref 8.4–10.5)
Calcium: 8.9 mg/dL (ref 8.4–10.5)
Calcium: 9.1 mg/dL (ref 8.4–10.5)
Chloride: 105 mEq/L (ref 96–112)
Chloride: 108 mEq/L (ref 96–112)
Chloride: 100 mEq/L (ref 96–112)
Chloride: 101 mEq/L (ref 96–112)
Chloride: 102 mEq/L (ref 96–112)
Chloride: 104 mEq/L (ref 96–112)
Chloride: 96 mEq/L (ref 96–112)
Chloride: 98 mEq/L (ref 96–112)
Chloride: 99 mEq/L (ref 96–112)
Creatinine, Ser: 0.96 mg/dL (ref 0.4–1.2)
Creatinine, Ser: 1.1 mg/dL (ref 0.4–1.2)
Creatinine, Ser: 0.82 mg/dL (ref 0.4–1.2)
Creatinine, Ser: 0.97 mg/dL (ref 0.4–1.2)
Creatinine, Ser: 0.98 mg/dL (ref 0.4–1.2)
Creatinine, Ser: 1.3 mg/dL — ABNORMAL HIGH (ref 0.4–1.2)
GFR calc Af Amer: 58 mL/min — ABNORMAL LOW (ref >60)
GFR calc Af Amer: >60 mL/min (ref >60)
GFR calc Af Amer: >60 mL/min (ref >60)
GFR calc Af Amer: 48 mL/min — ABNORMAL LOW (ref >60)
GFR calc Af Amer: >60 mL/min (ref >60)
GFR calc Af Amer: >60 mL/min (ref >60)
GFR calc Af Amer: >60 mL/min (ref >60)
GFR calc Af Amer: >60 mL/min (ref >60)
GFR calc Af Amer: >60 mL/min (ref >60)
GFR calc Af Amer: >60 mL/min (ref >60)
GFR calc non Af Amer: 53 mL/min — ABNORMAL LOW (ref >60)
GFR calc non Af Amer: 40 mL/min — ABNORMAL LOW (ref >60)
GFR calc non Af Amer: 50 mL/min — ABNORMAL LOW (ref >60)
GFR calc non Af Amer: 55 mL/min — ABNORMAL LOW (ref >60)
GFR calc non Af Amer: 56 mL/min — ABNORMAL LOW (ref >60)
GFR calc non Af Amer: 57 mL/min — ABNORMAL LOW (ref >60)
Glucose, Bld: 104 mg/dL — ABNORMAL HIGH (ref 70–99)
Glucose, Bld: 134 mg/dL — ABNORMAL HIGH (ref 70–99)
Glucose, Bld: 142 mg/dL — ABNORMAL HIGH (ref 70–99)
Glucose, Bld: 157 mg/dL — ABNORMAL HIGH (ref 70–99)
Glucose, Bld: 214 mg/dL — ABNORMAL HIGH (ref 70–99)
Glucose, Bld: 240 mg/dL — ABNORMAL HIGH (ref 70–99)
Glucose, Bld: 248 mg/dL — ABNORMAL HIGH (ref 70–99)
Glucose, Bld: 258 mg/dL — ABNORMAL HIGH (ref 70–99)
Potassium: 3 mEq/L — ABNORMAL LOW (ref 3.5–5.1)
Potassium: 3.4 mEq/L — ABNORMAL LOW (ref 3.5–5.1)
Potassium: 3.1 mEq/L — ABNORMAL LOW (ref 3.5–5.1)
Potassium: 3.5 mEq/L (ref 3.5–5.1)
Potassium: 3.5 mEq/L (ref 3.5–5.1)
Potassium: 3.5 mEq/L (ref 3.5–5.1)
Potassium: 3.6 mEq/L (ref 3.5–5.1)
Potassium: 3.6 mEq/L (ref 3.5–5.1)
Potassium: 3.8 mEq/L (ref 3.5–5.1)
Potassium: 4.1 mEq/L (ref 3.5–5.1)
Sodium: 143 mEq/L (ref 135–145)
Sodium: 145 mEq/L (ref 135–145)
Sodium: 152 mEq/L — ABNORMAL HIGH (ref 135–145)
Sodium: 135 mEq/L (ref 135–145)
Sodium: 136 mEq/L (ref 135–145)
Sodium: 137 mEq/L (ref 135–145)
Sodium: 138 mEq/L (ref 135–145)
Sodium: 142 mEq/L (ref 135–145)
Sodium: 144 mEq/L (ref 135–145)
Sodium: 146 mEq/L — ABNORMAL HIGH (ref 135–145)
Sodium: 149 mEq/L — ABNORMAL HIGH (ref 135–145)

## 2010-10-08 LAB — GLUCOSE, CAPILLARY
Glucose-Capillary: 106 mg/dL — ABNORMAL HIGH (ref 70–99)
Glucose-Capillary: 109 mg/dL — ABNORMAL HIGH (ref 70–99)
Glucose-Capillary: 120 mg/dL — ABNORMAL HIGH (ref 70–99)
Glucose-Capillary: 132 mg/dL — ABNORMAL HIGH (ref 70–99)
Glucose-Capillary: 144 mg/dL — ABNORMAL HIGH (ref 70–99)
Glucose-Capillary: 210 mg/dL — ABNORMAL HIGH (ref 70–99)
Glucose-Capillary: 238 mg/dL — ABNORMAL HIGH (ref 70–99)
Glucose-Capillary: 293 mg/dL — ABNORMAL HIGH (ref 70–99)
Glucose-Capillary: 304 mg/dL — ABNORMAL HIGH (ref 70–99)
Glucose-Capillary: 47 mg/dL — ABNORMAL LOW (ref 70–99)
Glucose-Capillary: 53 mg/dL — ABNORMAL LOW (ref 70–99)
Glucose-Capillary: 59 mg/dL — ABNORMAL LOW (ref 70–99)
Glucose-Capillary: 62 mg/dL — ABNORMAL LOW (ref 70–99)
Glucose-Capillary: 82 mg/dL (ref 70–99)
Glucose-Capillary: 92 mg/dL (ref 70–99)
Glucose-Capillary: 104 mg/dL — ABNORMAL HIGH (ref 70–99)
Glucose-Capillary: 106 mg/dL — ABNORMAL HIGH (ref 70–99)
Glucose-Capillary: 108 mg/dL — ABNORMAL HIGH (ref 70–99)
Glucose-Capillary: 110 mg/dL — ABNORMAL HIGH (ref 70–99)
Glucose-Capillary: 113 mg/dL — ABNORMAL HIGH (ref 70–99)
Glucose-Capillary: 117 mg/dL — ABNORMAL HIGH (ref 70–99)
Glucose-Capillary: 122 mg/dL — ABNORMAL HIGH (ref 70–99)
Glucose-Capillary: 128 mg/dL — ABNORMAL HIGH (ref 70–99)
Glucose-Capillary: 129 mg/dL — ABNORMAL HIGH (ref 70–99)
Glucose-Capillary: 132 mg/dL — ABNORMAL HIGH (ref 70–99)
Glucose-Capillary: 132 mg/dL — ABNORMAL HIGH (ref 70–99)
Glucose-Capillary: 133 mg/dL — ABNORMAL HIGH (ref 70–99)
Glucose-Capillary: 136 mg/dL — ABNORMAL HIGH (ref 70–99)
Glucose-Capillary: 137 mg/dL — ABNORMAL HIGH (ref 70–99)
Glucose-Capillary: 139 mg/dL — ABNORMAL HIGH (ref 70–99)
Glucose-Capillary: 139 mg/dL — ABNORMAL HIGH (ref 70–99)
Glucose-Capillary: 140 mg/dL — ABNORMAL HIGH (ref 70–99)
Glucose-Capillary: 141 mg/dL — ABNORMAL HIGH (ref 70–99)
Glucose-Capillary: 142 mg/dL — ABNORMAL HIGH (ref 70–99)
Glucose-Capillary: 142 mg/dL — ABNORMAL HIGH (ref 70–99)
Glucose-Capillary: 142 mg/dL — ABNORMAL HIGH (ref 70–99)
Glucose-Capillary: 144 mg/dL — ABNORMAL HIGH (ref 70–99)
Glucose-Capillary: 145 mg/dL — ABNORMAL HIGH (ref 70–99)
Glucose-Capillary: 146 mg/dL — ABNORMAL HIGH (ref 70–99)
Glucose-Capillary: 146 mg/dL — ABNORMAL HIGH (ref 70–99)
Glucose-Capillary: 147 mg/dL — ABNORMAL HIGH (ref 70–99)
Glucose-Capillary: 150 mg/dL — ABNORMAL HIGH (ref 70–99)
Glucose-Capillary: 151 mg/dL — ABNORMAL HIGH (ref 70–99)
Glucose-Capillary: 154 mg/dL — ABNORMAL HIGH (ref 70–99)
Glucose-Capillary: 157 mg/dL — ABNORMAL HIGH (ref 70–99)
Glucose-Capillary: 157 mg/dL — ABNORMAL HIGH (ref 70–99)
Glucose-Capillary: 158 mg/dL — ABNORMAL HIGH (ref 70–99)
Glucose-Capillary: 159 mg/dL — ABNORMAL HIGH (ref 70–99)
Glucose-Capillary: 163 mg/dL — ABNORMAL HIGH (ref 70–99)
Glucose-Capillary: 163 mg/dL — ABNORMAL HIGH (ref 70–99)
Glucose-Capillary: 165 mg/dL — ABNORMAL HIGH (ref 70–99)
Glucose-Capillary: 166 mg/dL — ABNORMAL HIGH (ref 70–99)
Glucose-Capillary: 166 mg/dL — ABNORMAL HIGH (ref 70–99)
Glucose-Capillary: 167 mg/dL — ABNORMAL HIGH (ref 70–99)
Glucose-Capillary: 168 mg/dL — ABNORMAL HIGH (ref 70–99)
Glucose-Capillary: 169 mg/dL — ABNORMAL HIGH (ref 70–99)
Glucose-Capillary: 171 mg/dL — ABNORMAL HIGH (ref 70–99)
Glucose-Capillary: 172 mg/dL — ABNORMAL HIGH (ref 70–99)
Glucose-Capillary: 176 mg/dL — ABNORMAL HIGH (ref 70–99)
Glucose-Capillary: 180 mg/dL — ABNORMAL HIGH (ref 70–99)
Glucose-Capillary: 182 mg/dL — ABNORMAL HIGH (ref 70–99)
Glucose-Capillary: 185 mg/dL — ABNORMAL HIGH (ref 70–99)
Glucose-Capillary: 190 mg/dL — ABNORMAL HIGH (ref 70–99)
Glucose-Capillary: 190 mg/dL — ABNORMAL HIGH (ref 70–99)
Glucose-Capillary: 197 mg/dL — ABNORMAL HIGH (ref 70–99)
Glucose-Capillary: 200 mg/dL — ABNORMAL HIGH (ref 70–99)
Glucose-Capillary: 203 mg/dL — ABNORMAL HIGH (ref 70–99)
Glucose-Capillary: 204 mg/dL — ABNORMAL HIGH (ref 70–99)
Glucose-Capillary: 205 mg/dL — ABNORMAL HIGH (ref 70–99)
Glucose-Capillary: 207 mg/dL — ABNORMAL HIGH (ref 70–99)
Glucose-Capillary: 210 mg/dL — ABNORMAL HIGH (ref 70–99)
Glucose-Capillary: 214 mg/dL — ABNORMAL HIGH (ref 70–99)
Glucose-Capillary: 221 mg/dL — ABNORMAL HIGH (ref 70–99)
Glucose-Capillary: 225 mg/dL — ABNORMAL HIGH (ref 70–99)
Glucose-Capillary: 228 mg/dL — ABNORMAL HIGH (ref 70–99)
Glucose-Capillary: 237 mg/dL — ABNORMAL HIGH (ref 70–99)
Glucose-Capillary: 243 mg/dL — ABNORMAL HIGH (ref 70–99)
Glucose-Capillary: 248 mg/dL — ABNORMAL HIGH (ref 70–99)
Glucose-Capillary: 251 mg/dL — ABNORMAL HIGH (ref 70–99)
Glucose-Capillary: 257 mg/dL — ABNORMAL HIGH (ref 70–99)
Glucose-Capillary: 257 mg/dL — ABNORMAL HIGH (ref 70–99)
Glucose-Capillary: 265 mg/dL — ABNORMAL HIGH (ref 70–99)
Glucose-Capillary: 269 mg/dL — ABNORMAL HIGH (ref 70–99)
Glucose-Capillary: 293 mg/dL — ABNORMAL HIGH (ref 70–99)
Glucose-Capillary: 295 mg/dL — ABNORMAL HIGH (ref 70–99)
Glucose-Capillary: 324 mg/dL — ABNORMAL HIGH (ref 70–99)
Glucose-Capillary: 70 mg/dL (ref 70–99)
Glucose-Capillary: 72 mg/dL (ref 70–99)
Glucose-Capillary: 85 mg/dL (ref 70–99)

## 2010-10-08 LAB — CULTURE, BLOOD (ROUTINE X 2): Culture: NO GROWTH

## 2010-10-08 LAB — CBC
HCT: 26.9 % — ABNORMAL LOW (ref 36.0–46.0)
HCT: 28.7 % — ABNORMAL LOW (ref 36.0–46.0)
HCT: 28.8 % — ABNORMAL LOW (ref 36.0–46.0)
HCT: 30.9 % — ABNORMAL LOW (ref 36.0–46.0)
HCT: 31.1 % — ABNORMAL LOW (ref 36.0–46.0)
HCT: 31.9 % — ABNORMAL LOW (ref 36.0–46.0)
HCT: 33.4 % — ABNORMAL LOW (ref 36.0–46.0)
HCT: 33.6 % — ABNORMAL LOW (ref 36.0–46.0)
Hemoglobin: 9.8 g/dL — ABNORMAL LOW (ref 12.0–15.0)
Hemoglobin: 10.6 g/dL — ABNORMAL LOW (ref 12.0–15.0)
Hemoglobin: 10.9 g/dL — ABNORMAL LOW (ref 12.0–15.0)
Hemoglobin: 11.4 g/dL — ABNORMAL LOW (ref 12.0–15.0)
Hemoglobin: 11.6 g/dL — ABNORMAL LOW (ref 12.0–15.0)
Hemoglobin: 9.1 g/dL — ABNORMAL LOW (ref 12.0–15.0)
Hemoglobin: 9.4 g/dL — ABNORMAL LOW (ref 12.0–15.0)
Hemoglobin: 9.6 g/dL — ABNORMAL LOW (ref 12.0–15.0)
MCHC: 33.6 g/dL (ref 30.0–36.0)
MCHC: 33.2 g/dL (ref 30.0–36.0)
MCHC: 33.2 g/dL (ref 30.0–36.0)
MCHC: 33.3 g/dL (ref 30.0–36.0)
MCHC: 33.5 g/dL (ref 30.0–36.0)
MCHC: 34 g/dL (ref 30.0–36.0)
MCHC: 34.2 g/dL (ref 30.0–36.0)
MCHC: 34.4 g/dL (ref 30.0–36.0)
MCV: 90.3 fL (ref 78.0–100.0)
MCV: 90.4 fL (ref 78.0–100.0)
MCV: 90.4 fL (ref 78.0–100.0)
MCV: 90.7 fL (ref 78.0–100.0)
MCV: 90.9 fL (ref 78.0–100.0)
Platelets: 138 10*3/uL — ABNORMAL LOW (ref 150–400)
Platelets: 240 10*3/uL (ref 150–400)
Platelets: 284 10*3/uL (ref 150–400)
Platelets: 291 10*3/uL (ref 150–400)
Platelets: 292 10*3/uL (ref 150–400)
Platelets: 328 10*3/uL (ref 150–400)
RBC: 3.18 MIL/uL — ABNORMAL LOW (ref 3.87–5.11)
RBC: 3.14 MIL/uL — ABNORMAL LOW (ref 3.87–5.11)
RBC: 3.17 MIL/uL — ABNORMAL LOW (ref 3.87–5.11)
RBC: 3.43 MIL/uL — ABNORMAL LOW (ref 3.87–5.11)
RBC: 3.51 MIL/uL — ABNORMAL LOW (ref 3.87–5.11)
RBC: 3.67 MIL/uL — ABNORMAL LOW (ref 3.87–5.11)
RBC: 3.72 MIL/uL — ABNORMAL LOW (ref 3.87–5.11)
RDW: 12.5 % (ref 11.5–15.5)
RDW: 12.6 % (ref 11.5–15.5)
RDW: 12.7 % (ref 11.5–15.5)
RDW: 13.4 % (ref 11.5–15.5)
RDW: 13.4 % (ref 11.5–15.5)
RDW: 13.6 % (ref 11.5–15.5)
RDW: 13.6 % (ref 11.5–15.5)
WBC: 11.8 10*3/uL — ABNORMAL HIGH (ref 4.0–10.5)
WBC: 11.9 10*3/uL — ABNORMAL HIGH (ref 4.0–10.5)
WBC: 14.3 10*3/uL — ABNORMAL HIGH (ref 4.0–10.5)
WBC: 7.9 10*3/uL (ref 4.0–10.5)
WBC: 9.4 10*3/uL (ref 4.0–10.5)

## 2010-10-08 LAB — BLOOD GAS, ARTERIAL
Acid-Base Excess: 11.4 mmol/L — ABNORMAL HIGH (ref 0.0–2.0)
Acid-Base Excess: 11.7 mmol/L — ABNORMAL HIGH (ref 0.0–2.0)
Acid-Base Excess: 14.2 mmol/L — ABNORMAL HIGH (ref 0.0–2.0)
Acid-Base Excess: 4.3 mmol/L — ABNORMAL HIGH (ref 0.0–2.0)
Acid-Base Excess: 8.8 mmol/L — ABNORMAL HIGH (ref 0.0–2.0)
Acid-base deficit: 2.3 mmol/L — ABNORMAL HIGH (ref 0.0–2.0)
Bicarbonate: 21.9 mEq/L (ref 20.0–24.0)
Bicarbonate: 30.5 mEq/L — ABNORMAL HIGH (ref 20.0–24.0)
Bicarbonate: 35.5 mEq/L — ABNORMAL HIGH (ref 20.0–24.0)
Bicarbonate: 38.9 mEq/L — ABNORMAL HIGH (ref 20.0–24.0)
Bicarbonate: 38.9 mEq/L — ABNORMAL HIGH (ref 20.0–24.0)
Delivery systems: POSITIVE
Drawn by: 249101
FIO2: 0.5 %
FIO2: 0.7 %
FIO2: 0.7 %
FIO2: 1 %
FIO2: 1 %
FIO2: 1 %
MECHVT: 400 mL
MECHVT: 470 mL
MECHVT: 500 mL
Mode: POSITIVE
O2 Saturation: 94 %
O2 Saturation: 94.1 %
O2 Saturation: 94.3 %
O2 Saturation: 95 %
O2 Saturation: 95.4 %
O2 Saturation: 95.7 %
O2 Saturation: 96.7 %
O2 Saturation: 99 %
PEEP: 10 cmH2O
PEEP: 5 cmH2O
PEEP: 5 cmH2O
PEEP: 5 cmH2O
Patient temperature: 100.1
Patient temperature: 100.6
Patient temperature: 98.6
Patient temperature: 98.6
Patient temperature: 98.6
RATE: 16 resp/min
RATE: 22 resp/min
RATE: 28 resp/min
TCO2: 23.1 mmol/L (ref 0–100)
TCO2: 30.3 mmol/L (ref 0–100)
TCO2: 32 mmol/L (ref 0–100)
TCO2: 36.9 mmol/L (ref 0–100)
TCO2: 37.1 mmol/L (ref 0–100)
TCO2: 40.6 mmol/L (ref 0–100)
TCO2: 40.6 mmol/L (ref 0–100)
pCO2 arterial: 44.8 mmHg (ref 35.0–45.0)
pCO2 arterial: 46.2 mmHg — ABNORMAL HIGH (ref 35.0–45.0)
pCO2 arterial: 47.5 mmHg — ABNORMAL HIGH (ref 35.0–45.0)
pCO2 arterial: 48 mmHg — ABNORMAL HIGH (ref 35.0–45.0)
pCO2 arterial: 54.4 mmHg — ABNORMAL HIGH (ref 35.0–45.0)
pH, Arterial: 7.4 (ref 7.350–7.400)
pH, Arterial: 7.406 — ABNORMAL HIGH (ref 7.350–7.400)
pH, Arterial: 7.434 — ABNORMAL HIGH (ref 7.350–7.400)
pH, Arterial: 7.452 — ABNORMAL HIGH (ref 7.350–7.400)
pH, Arterial: 7.483 — ABNORMAL HIGH (ref 7.350–7.400)
pH, Arterial: 7.512 — ABNORMAL HIGH (ref 7.350–7.400)
pO2, Arterial: 122 mmHg — ABNORMAL HIGH (ref 80.0–100.0)
pO2, Arterial: 143 mmHg — ABNORMAL HIGH (ref 80.0–100.0)
pO2, Arterial: 65 mmHg — ABNORMAL LOW (ref 80.0–100.0)
pO2, Arterial: 66.7 mmHg — ABNORMAL LOW (ref 80.0–100.0)
pO2, Arterial: 74.6 mmHg — ABNORMAL LOW (ref 80.0–100.0)
pO2, Arterial: 75.3 mmHg — ABNORMAL LOW (ref 80.0–100.0)
pO2, Arterial: 78.8 mmHg — ABNORMAL LOW (ref 80.0–100.0)
pO2, Arterial: 96.9 mmHg (ref 80.0–100.0)
pO2, Arterial: 97.9 mmHg (ref 80.0–100.0)

## 2010-10-08 LAB — COMPREHENSIVE METABOLIC PANEL
ALT: 16 U/L (ref 0–35)
ALT: 27 U/L (ref 0–35)
AST: 15 U/L (ref 0–37)
AST: 33 U/L (ref 0–37)
Albumin: 2.2 g/dL — ABNORMAL LOW (ref 3.5–5.2)
Albumin: 3.2 g/dL — ABNORMAL LOW (ref 3.5–5.2)
BUN: 18 mg/dL (ref 6–23)
CO2: 39 mEq/L — ABNORMAL HIGH (ref 19–32)
Calcium: 8.1 mg/dL — ABNORMAL LOW (ref 8.4–10.5)
Calcium: 8.5 mg/dL (ref 8.4–10.5)
Calcium: 8.6 mg/dL (ref 8.4–10.5)
Chloride: 105 mEq/L (ref 96–112)
Chloride: 98 mEq/L (ref 96–112)
Creatinine, Ser: 1.03 mg/dL (ref 0.4–1.2)
GFR calc Af Amer: >60 mL/min (ref >60)
GFR calc Af Amer: >60 mL/min (ref >60)
GFR calc non Af Amer: 50 mL/min — ABNORMAL LOW (ref >60)
GFR calc non Af Amer: 52 mL/min — ABNORMAL LOW (ref >60)
Sodium: 133 mEq/L — ABNORMAL LOW (ref 135–145)
Sodium: 144 mEq/L (ref 135–145)
Total Bilirubin: 0.5 mg/dL (ref 0.3–1.2)
Total Bilirubin: 0.6 mg/dL (ref 0.3–1.2)
Total Protein: 5.8 g/dL — ABNORMAL LOW (ref 6.0–8.3)

## 2010-10-08 LAB — BRAIN NATRIURETIC PEPTIDE: Pro B Natriuretic peptide (BNP): 338 pg/mL — ABNORMAL HIGH (ref 0.0–100.0)

## 2010-10-08 LAB — PROTIME-INR
INR: 1.1 (ref 0.00–1.49)
Prothrombin Time: 14.5 seconds (ref 11.6–15.2)

## 2010-10-08 LAB — CULTURE, BAL-QUANTITATIVE W GRAM STAIN: Colony Count: 75000

## 2010-10-08 LAB — DIFFERENTIAL
Eosinophils Absolute: 0 10*3/uL (ref 0.0–0.7)
Eosinophils Relative: 0 % (ref 0–5)
Lymphs Abs: 0.7 10*3/uL (ref 0.7–4.0)
Monocytes Relative: 6 % (ref 3–12)
Neutrophils Relative %: 87 % — ABNORMAL HIGH (ref 43–77)

## 2010-10-08 LAB — AFB CULTURE WITH SMEAR (NOT AT ARMC)

## 2010-10-08 LAB — FUNGUS CULTURE W SMEAR: Fungal Smear: NONE SEEN

## 2010-10-08 LAB — BODY FLUID CELL COUNT WITH DIFFERENTIAL: Lymphs, Fluid: 44 %

## 2010-10-08 LAB — URINE MICROSCOPIC-ADD ON

## 2010-10-08 LAB — SAMPLE TO BLOOD BANK

## 2010-10-08 LAB — MAGNESIUM: Magnesium: 2.6 mg/dL — ABNORMAL HIGH (ref 1.5–2.5)

## 2010-10-08 LAB — URINALYSIS, ROUTINE W REFLEX MICROSCOPIC
Ketones, ur: 40 mg/dL — AB
Leukocytes, UA: NEGATIVE
Nitrite: NEGATIVE
pH: 5 (ref 5.0–8.0)

## 2010-10-08 LAB — APTT: aPTT: 29 seconds (ref 24–37)

## 2010-10-08 LAB — TROPONIN I: Troponin I: 0.01 ng/mL (ref 0.00–0.06)

## 2010-10-08 LAB — PHOSPHORUS: Phosphorus: 1.8 mg/dL — ABNORMAL LOW (ref 2.3–4.6)

## 2010-10-08 LAB — TSH: TSH: 1.937 u[IU]/mL (ref 0.350–4.500)

## 2010-10-08 LAB — VANCOMYCIN, TROUGH: Vancomycin Tr: 17 ug/mL (ref 10.0–20.0)

## 2010-11-13 NOTE — Consult Note (Signed)
[name]                ACCOUNT NO.:  [id number]   MEDICAL RECORD NO.:  [id number]          PATIENT TYPE:  INP   LOCATION:  3114                         FACILITY:  [facility]   PHYSICIAN:  [name], MDDATE OF BIRTH:  23-Jan-1930   DATE OF CONSULTATION:  12/08/2008  DATE OF DISCHARGE:                                 CONSULTATION   PRIMARY CARDIOLOGIST:  Lauren Rotunda, Morgan, [facility]   PRIMARY CARE [name]   GASTROENTEROLOGY:  Lauren Boop, Morgan, [facility].   PATIENT PROFILE:  A 77 year old Caucasian female with history of  paroxysmal atrial fibrillation who has had recurrent atrial fibrillation  in the setting of recent ankle fracture and subsequent aspiration  pneumonia requiring intubation.   PROBLEMS:  1. Paroxysmal atrial fibrillation.      a.     On December 01, 2008, 2-D echocardiogram, ejection fraction 60-       65%, normal wall motion, no regional wall motion abnormalities,       trivial mitral regurgitation/tricuspid regurgitation.  2. Ventilator-dependent respiratory failure.  3. Aspiration/right lower lobe pneumonia.  4. Hypertension.  5. Hyperlipidemia.  6. Type 2 diabetes mellitus.  7. Status post right ankle fracture in 2005 secondary to fall with      surgical repair.  8. Status post left ankle fracture on November 29, 2008, with open      reduction and internal fixation on November 30, 2008.  9. Chronic obstructive pulmonary disease.  10.History of carotid stenosis.  11.History of small bowel arteriovenous malformations status post      capsule endoscopy in September 2006.  Notably, the patient was      taken off the Coumadin for this reason.  12.Diverticulosis.   HISTORY OF PRESENT ILLNESS:  A 77 year old Caucasian female with the  above problem list.  She was diagnosed with paroxysmal atrial  fibrillation in the postoperative setting in 2005 after right ankle  surgery.  She was initially placed on Coumadin and this was later  discontinued  in March 2006 secondary to GI bleeding and melena.  Colonoscopy was subsequently undertaken and showed no evidence of  bleeding.  The patient was placed back on Coumadin.  Her H and H was  apparently stable, although over the summer of 2006, she had recurrent  of melena and bright red blood per rectum and recurrent anemia.  She was  seen by [name] again in August and September 2006 with capsule  endoscopy showing small bowel arteriovenous malformations.  Decision was  made at that point by [name] to take her off of Coumadin.  She has  not been on Coumadin since.  She last saw [name] in 2007.  On November 29, 2008, she was admitted following a fall and ankle fracture.  She  subsequently underwent ORIF on November 30, 2008.  Postoperatively, she was  noted to be hypoxic and with increased oxygen demands and increase work  of breathing.  She was initially kept on mask for several days.  She was  treated for presumed aspiration pneumonia initially with Avelox  and  subsequently with Zosyn and vancomycin.  On December 03, 2008, she was noted  to have AFib with RVR and she was placed on an IV diltiazem drip.  This  was subsequently converted to an oral diltiazem with reasonable rate  control.  Unfortunately, her respiratory status has continued to  decline.  She was felt at high risk for DVT and PE and lower extremity  ultrasound was negative while CT of the chest with contrast showed no  evidence of PE.  The patient was intubated on December 07, 2008.  A repeat CT  of the chest was performed earlier today showing persistent dense right  lower lobe consolidation consistent with pneumonia.  She also has small  right greater than left effusions.  In the setting of progressive  respiratory failure, her heart rate has again increased to greater than  100.  We have been asked to evaluate, both for rate control as well as  for consideration of anticoagulation.   ALLERGIES:  COUMADIN has been listed as  an allergy previously for her,  although it is more of a contraindication in the setting of previous GI  bleeding.   CURRENT MEDICATIONS:  1. Aspirin 81 mg daily.  2. Diltiazem 60 mg via tube q.8 h.  3. Enoxaparin 30 mg daily.  4. Lasix 20 mg IV b.i.d.  5. Haldol 3 mg IV q.6 h.  6. Sliding scale insulin.  7. Lantus 4 units nightly.  8. Jevity tube feeding tube b.i.d.  9. Protonix 40 mg via tube nightly.  10.Zosyn 3.375 g IV q.8 h.  11.K-Dur 20 mEq via tube daily.  12.Simvastatin 20 mg via tube nightly.  13.Vancomycin 1 g IV b.i.d.   FAMILY HISTORY:  Mother died at 31 of unknown causes.  Father died at 58  of CAD.  She has 2 sisters and 2 brothers who died of cancer and then 2  other brothers from coronary artery disease.   SOCIAL HISTORY:  The patient lives in [place].  That is all of the  social history that is currently available and the patient being  intubated and not able to provide any additional.   REVIEW OF SYSTEMS:  Unable to obtain at this time secondary to  intubation and mild sedation.  She is a full code.   PHYSICAL EXAMINATION:  VITAL SIGNS:  Temperature 99.7 down from 100  earlier today, heart rate 117, respirations 22, blood pressure 133/75.  Her CVP is 5, pulse ox 100% on FiO2 of 70%.  GENERAL:  She is intubated and groggy, although she opens her eyes and  nods for yes and no questions.  HEENT:  Normal.  Unable to fully evaluate neurologically.  MUSCULOSKELETAL:  Her strength is approximately 3/5 in bilateral upper  and lower extremities.  SKIN:  Warm and dry without lesions or masses.  She does have  dressing/cast over the left lower leg.  NECK:  Supple without bruits, JVD.  LUNGS:  Respirations are regular and unlabored with diminished breath  sounds and scattered rhonchi throughout.  CARDIAC:  Irregularly regular, S1 and S2.  No S3, S4, or murmurs.  She  is tachycardic.  ABDOMEN:  Round, soft, nontender, nondistended.  Bowel sounds present  x4.   EXTREMITIES:  Warm, dry, and pink with cast as above to the left lower  leg.  There is no clubbing, cyanosis, or edema.   CT of her chest today showed persistent dense right lower lobe  consolidation consistent with pneumonia and small  right greater than  left effusions.  She had CTA of her chest on December 04, 2008, showing no  PE, mild mediastinal lymph nodes.  She had lower extremity ultrasound on  December 05, 2008, that was negative for DVT.  EKG shows AFib at rate of 121.  There are no acute ST-T changes.   Hemoglobin 9.4, hematocrit 28.3, WBCs 7.4, platelets 292.  Sodium 124,  potassium 3.1, chloride 100, CO2 39, BUN 21, creatinine 0.72, glucose  258, total bilirubin 0.7, alkaline phosphatase 41, AST 15, ALT 16.  BNP  338, CK 75, MB 1.2, troponin I 0.01.   ASSESSMENT AND PLAN:  1. Recurrent paroxysmal atrial fibrillation in the setting of      aspiration pneumonia/adult respiratory distress syndrome and recent      left ankle fracture status post surgery.  Rate was previously      controlled, but she had become more hypoxic requiring greater      amounts of oxygen with subsequent intubation.  Her rates have      increased.  There was also appears time where she did not have a      Panda tube and was not receiving diltiazem.  We will go ahead and      switch her from via tube diltiazem to IV diltiazem at least until      she has some clinical improvement.  With regards to      anticoagulation, the patient was previously anticoagulated in 2005      and 2006.  She had to stop Coumadin twice during that period of      time secondary to recurrent GI bleeding and anemia and was      subsequently found to have arteriovenous malformations of her small      bowel.  With this being the case, we would hold off on re-      anticoagulation with Coumadin.  Further with history of falls and      ankle fractures, she again is likely a poor Coumadin candidate.  We      will increase aspirin to 325 mg  daily and consider Plavix if she      can tolerate.  2. Hypertension, stable.  3. Hyperlipidemia.  She did not have any lipids here and is currently      treated with statin therapy.  Her LFTs were within normal limits.  4. Status post left ankle fracture per Orthopedics.  5. Aspiration pneumonia.  Antibiotics per Pulmonary/Critical Care.      [name], ANP      Lauren Buckles. Bensimhon, Morgan  Electronically Signed    CB/MEDQ  D:  12/08/2008  T:  12/09/2008  Job:  [id number]

## 2010-11-13 NOTE — Consult Note (Signed)
NAMEJAKERIA, [name]                ACCOUNT NO.:  [id number]   MEDICAL RECORD NO.:  [id number]          PATIENT TYPE:  INP   LOCATION:  3114                         FACILITY:  [facility]   PHYSICIAN:  Acey Lav, MD  DATE OF BIRTH:  Feb 12, 1930   DATE OF CONSULTATION:  12/12/2008  DATE OF DISCHARGE:                                 CONSULTATION   REQUESTING PHYSICIAN:  Dr. Cyril Mourning.   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   HISTORY OF PRESENT ILLNESS:  [name] is a 77 year old Caucasian  female with past medical history significant for diabetes mellitus,  hypertension who sustained a left ankle fracture after tripping and  falling.  She had a left ORIF with difficulty weaning from the  ventilator.  After extubation, she then rapidly deteriorated and  developed what was an apparent aspiration pneumonia.  She was started on  Zosyn and vancomycin on December 03, 2008, and continued on these antibiotics  for a 7-day course.  In the interim, she underwent a bronchoalveolar  lavage on December 07, 2008.  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  In the interim, she has improved substantially and has been  extubated, now on face mask.  She is currently afebrile and  hemodynamically stable.  We are consulted by Dr. Vassie Loll with regards to  this multidrug-resistant pathogen isolated from her BAL.  The question  was whether or not this patient required treatment for this organism,  and if so, what antibiotic should be used.   PAST MEDICAL HISTORY:  1. Paroxysmal atrial fibrillation.  2. History of ventilator-dependent respiratory failure.  3. Hypertension.  4. Hyperlipidemia.  5. Diabetes mellitus.  6. Ankle fracture in 2005.  7. Ankle fracture June 2010 with ORIF on November 30, 2008.  8. Chronic obstructive pulmonary disease.  9. Carotid stenosis.  10.Small bowel arteriovenous malformation, status post capsular      endoscopy in September 2006.  11.Diverticulosis.   PAST SURGICAL HISTORY:  As described above.   FAMILY HISTORY:  Mother died at 76 of unknown causes.  Father died at 21  of coronary artery disease.   Two sisters and two brothers with cancer.   SOCIAL HISTORY:  The patient lives in [place].   REVIEW OF SYSTEMS:  Described in history of present illness.  Otherwise,  10-point review of systems is negative.   ALLERGIES:  MORPHINE.   CURRENT MEDICATIONS AND REVIEW OF ANTIBIOTICS:  As mentioned.  Vancomycin and Zosyn were administered from December 03, 2008, through December 11, 2008.   ADDITIONAL MEDICATIONS:  Aspirin, diltiazem, enoxaparin, Jevity,  __________ , albuterol, pantoprazole, Zocor, Tylenol.   PHYSICAL EXAMINATION:  VITAL SIGNS:  Blood pressure 141/48, pulse 64  atrial fibrillation, respiratory breathing rate 22 times per minute  saturating 100% on FIO2 of 35%.  Temperature maximum last 24 hours is 99  degrees.  GENERAL:  Quite pleasant lady wearing face mask with feeding tube in her  nose still.  HEENT:  She  is normocephalic, atraumatic.  Pupils equal, round, reactive  to light.  Sclerae are anicteric.  Oropharynx without obvious thrush.  CARDIOVASCULAR:  Irregular irregular rhythm.  No murmurs, gallops or  rubs.  LUNGS:  Some crackles posteriorly bilaterally to bases, worse on the  right than left.  ABDOMEN:  Soft, nondistended, nontender.  EXTREMITIES:  Lower extremity with bruises and stitches and one ankle  wrapped.   LABORATORY DATA:  CBC with differential:  White count 14.3, hemoglobin  9.1, platelets 284.  Metabolic panel:  Sodium 146, potassium 3.6,  chloride 100, bicarb 39, glucose 248, BUN and creatinine 23 and 1.3  respectively.   MICROBIOLOGICAL DATA:  Bronchoalveolar lavage on December 07, 2008:  Acinetobacter calcoaceticus-baumannii complex.  It is sensitive to   colistin with MIC of 0.5.  It is intermediate to ceftazidime with a MIC  of 16, intermediate to ceftriaxone with a MIC of 32, resistant to  ciprofloxacin, resistant to gentamicin, intermediate to imipenem,  resistant to piperacillin/tazobactam, intermediate to tobramycin,  resistant to Bactrim, resistant to Levaquin, resistant to amikacin,  intermediate to aztreonam.   BAL fungal smear:  No yeast or fungus seen.   AFB bronchoalveolar lavage:  No AFB seen, no growth.   Blood culture, November 30, 2008: but no growth.   IMPRESSION AND RECOMMENDATIONS:  This is a 77 year old Caucasian female  who developed aspiration pneumonia that was treated with vancomycin and  Zosyn and who clinically has responded well to this.  She is currently  extubated.  She has now grown multidrug-resistant Acinetobacter from her  bronchoalveolar lavage.  1. Aspiration pneumonia:  This seems to have responded quite nicely      clinically to her vancomycin and Zosyn, and she has completed more      than a 7-day course of therapy.  I would not continue any more      vancomycin or Zosyn for this.  2. Acinetobacter baumannii isolated from bronchoalveolar lavage:  This      is more likely a colonizer.  I doubt that this bacteria was playing      a role in the patient's presentation given that she has responded      quite well without any antibiotics that are effective against this      orgnism.  Certainly, it CAN be a serious pathogen and is resistant      to multiple antibiotics.  Should she deteriorate, I would      definitely target this pathogen, and I would give her colistin      intravenously dosed by pharmacy along with rifampin which is      usually typically administered at a high dose such as 600 mg twice      daily.  In the interim, I will ask the  microbiology lab to check      for Unasyn susceptibility as sometimes Acinetobacter is sensitive      to the sulbactam component of ampicillin/sulbactam.  I will  also      ask them to test for tigecycline sensitivity although tigecycline      is increasingly less reliable against Acinetobacter.  I would note      her white blood cell count is climbing slightly, and certainly this      is of slight concern, although there are certainly other potential      causes for this.  3. Infection prevention issues:  The patient should be on contact      precautions for Acinetobacter baumannii.  There is no indication      for droplet precautions for this organism. Certainly, it is      reasonable to use the shoe covers which are being used to try and      reduce spread of this agent which can survive on inanimate      surfaces.  Otherwise, other infectious prevention things as per      infection prevention policy.   Thank you for this infectious disease consultation.  As mentioned above,  at this point in time, I would not pull the trigger on treating this  lady for ventilator-associated pneumonia with Acinetobacter.  I would  follow her clinically, and if she deteriorates again, I would start  colistin or rifampin.  Please page or call us with any questions.      Acey Lav, MD  Electronically Signed     CV/MEDQ  D:  12/12/2008  T:  12/12/2008  Job:  [id number]   cc:   Oretha Milch, MD  Eduard Clos, MD

## 2010-11-13 NOTE — Op Note (Signed)
[name], [name]                ACCOUNT NO.:  [id number]   MEDICAL RECORD NO.:  [id number]          PATIENT TYPE:  INP   LOCATION:  3114                         FACILITY:  [facility]   PHYSICIAN:  Nadara Mustard, MD     DATE OF BIRTH:  09-09-1929   DATE OF PROCEDURE:  11/30/2008  DATE OF DISCHARGE:                               OPERATIVE REPORT   PREOPERATIVE DIAGNOSIS:  Trimalleolar left ankle fracture.   POSTOPERATIVE DIAGNOSIS:  Trimalleolar left ankle fracture.   PROCEDURE:  Open reduction and internal fixation, left ankle.   SURGEON:  Nadara Mustard, MD   ANESTHESIA:  Popliteal block plus LMA.   ESTIMATED BLOOD LOSS:  Minimal.   ANTIBIOTICS:  1 g of Kefzol.   DRAINS:  None.   COMPLICATIONS:  None.   TOURNIQUET TIME:  None.   DISPOSITION:  To PACU in stable condition.   INDICATIONS FOR THE PROCEDURE:  The patient is a 77 year old woman who  lives home alone with her pet who fell sustaining a trimalleolar left  ankle fracture.  She was initially seen in the ER, underwent closed  reduction and presents at this time for open reduction and internal  fixation.  The patient does have a history of tobacco abuse for over 60  years.  She has a history of type 2 diabetes, currently poorly  controlled; history of high cholesterol and hypertension.  Risks and  benefits of surgery were discussed including infection, neurovascular  injury, persistent pain, arthritis, failure of fixation, DVT, pulmonary  embolus, and need for additional surgery.  The patient states she  understands and wish to proceed at this time.   DESCRIPTION OF PROCEDURE:  The patient was brought to OR room 8 after  undergoing a popliteal block.  She then underwent general LMA  anesthetic.  After adequate level of anesthesia obtained, the patient's  left lower extremity was prepped using DuraPrep and draped into a  sterile field.  A lateral incision was made.  This was carried down to  the fracture site.  The  fracture site was freshened.  The fracture  reduced and a antiglide plate was placed posterior laterally.  The bone  quality was extremely poor and a lag screw was attempted, but this had  very poor purchase in the bone.  The plate was locked approximately x3  and distally x2.  C-arm fluoroscopy verified reduction in the fibula.  The wound was irrigated with normal saline.  The subcu was closed using  2-0 Vicryl.  The skin was closed using approximating staples.  Attention  was then focused medially.  An incision was made longitudinally on the  medial malleolus.  This was carried down to the fracture site.  The  wound was irrigated with normal saline.  The fracture edges were  freshened and this was reduced and stabilized with 2 partially threaded  2.7 cancellous screws 40 mm in length.  C-arm fluoroscopy verified  restoration of the mortise and alignment of the ankle in both AP and  lateral planes.  The wound was again irrigated.  The subcu was closed  using 2-0 Vicryl, skin was closed using approximate staples.  The wound  was covered with Adaptic orthopedic sponges, Kerlix and Coban.  The  patient was then taken to PACU in stable condition.  She did have an  elevated temperature immediately preoperatively; however, the patient  was in no distress and it was felt prudent to proceed with surgery to  minimize her risk of developing a pneumonia with prolonged bed rest with  the focus of irrigating ankle fixed and getting her up sooner to  minimize her pulmonary complications.  We will consult the hospitalist.  We will obtain a chest x-ray.  We will keep her on Kefzol, also  potential for nebulizer treatment.      Nadara Mustard, MD  Electronically Signed     MVD/MEDQ  D:  11/30/2008  T:  11/30/2008  Job:  [id number]

## 2010-11-13 NOTE — Consult Note (Signed)
NAMEMIONNA, ADVINCULA                ACCOUNT NO.:  [id number]   MEDICAL RECORD NO.:  [id number]          PATIENT TYPE:  INP   LOCATION:  2550                         FACILITY:  [facility]   PHYSICIAN:  Eduard Clos, MDDATE OF BIRTH:  Mar 25, 1930   DATE OF CONSULTATION:  11/30/2008  DATE OF DISCHARGE:                                 CONSULTATION   The patient's orthopedic doctor is Dr. Lajoyce Corners who called the consult  because of acute respiratory failure.   CHIEF COMPLAINT:  The patient requiring 100% oxygen after surgery.   HISTORY OF PRESENT ILLNESS:  A 77 year old female with known history of  diabetes mellitus type 2 and hypertension who sustained a left ankle  fracture when the patient tripped and fell.  The patient had a left ORIF  today, subsequently after which the patient is requiring 100% O2.  Chest  x-ray done today shows bilateral infiltrates.  At this time, the patient  is drowsy status post surgery - 3 hours after the surgery.  Her  drowsiness is likely probably secondary to anesthesia.  The patient is  able to be easily aroused, answers questions appropriately, moves all  limbs.  Denies any chest pain or shortness of breath.  Completed full  sentences on talking but requires a little bit of effort on doing the  same.  Denies any abdominal pain.  Has no nausea, vomiting or headache,  and denies any dysuria or discharge.   PAST MEDICAL HISTORY:  1. Hypertension.  2. Diabetes mellitus type 2.  3. Hyperlipidemia.   PAST SURGICAL HISTORY:  Previous history of right ankle surgery after  she fell.   MEDICATIONS:  Presently, the patient is on:  1. Albuterol.  2. Amlodipine.  3. Cefazolin.  4. Glyburide 5 mg.  5. Lisinopril 5 mg.  6. Zocor 20 mg.  7. IV fluids at 75 mL per hour of normal saline.  8. Dilaudid p.r.n. for pain.  9. Zofran p.r.n. for nausea.   ALLERGIES:  COUMADIN.   FAMILY HISTORY:  Nothing contributory.   SOCIAL HISTORY:  Denies smoking cigarettes,  drinking alcohol, using  illegal drugs.   REVIEW OF SYSTEMS:  As per in the history of present illness.  Nothing  else significant.   PHYSICAL EXAMINATION:  Patient examined at bedside, is drowsy, easily  arousable, answers questions appropriately.  Blood pressure was 140/80,  pulse 90 per minute, temperature 99.3 - maximum of 101.5, respirations  24 per minute, O2 saturation 94% on 100% nonbreather.  HEENT:  Anicteric.  No pallor.  CHEST:  Bilateral air entry present.  No crepitation or rhonchi.  Coarse  bronchial sounds.  HEART:  S1-S2 heard.  ABDOMEN:  Soft, nontender.  Bowel sounds heard.  CENTRAL NERVOUS SYSTEM:  The patient is drowsy, easily arousable,  answers questions appropriately.  Moves upper and lower limbs 5/5.  EXTREMITIES:  Left ankle is in dressing, is kept elevated.  Pulses felt.   LABORATORY DATA:  Monitor shows sinus rhythm with rate changing from 90  to 106.  Chest x-ray done on November 30, 2008, shows right perihilar air  space  consolidation, may represent pneumonia, neoplasm not excluded -  follow-up until cleared is recommended, mild bibasilar air space disease  right greater than left.  Labs done on November 29, 2008, shows WBC of 11.9,  hemoglobin 11.1, hematocrit of 33.3, platelets 138, neutrophils 87%.  PT/INR 15.9 and 1.2.  CBGs 168 and 135.  Complete metabolic panel:  Sodium 133, potassium 3.8, chloride 102, carbon dioxide 25, glucose 221,  BUN 21, creatinine 0.9, alkaline phosphatase 41, AST 15, ALT 16, albumin  3.2, calcium 8.1.  Urine shows ketones 40, nitrites and leukocytes are  negative.   ASSESSMENT:  1. Acute respiratory failure secondary to bilateral pneumonia.  2. Bilateral pneumonia, health-care associated, possible aspiration.  3. Status post left ankle surgery.  4. Diabetes mellitus type 2.  5. Hypertension.   PLAN:  1. At this time, we will transfer patient to step-down unit for close      observation.  2. Will get an ABG stat.  3. Will  place patient on empiric antibiotics which will include      vancomycin and Zosyn for health-care associated pneumonia.  4. Will give a dose of Lasix and discontinue IV fluids.  5. Will also discontinue glyburide and place patient on moderate dose      NovoLog sliding scale with insulin coverage.   Will closely observe patient, and recommendations will be based on as  condition evolves.  Thanks for involving [place] in patient's care.  We will  follow along with you.      Eduard Clos, MD  Electronically Signed     ANK/MEDQ  D:  11/30/2008  T:  11/30/2008  Job:  [id number]

## 2010-11-13 NOTE — Discharge Summary (Signed)
NAMEAMBUR, PROVINCE                ACCOUNT NO.:  [id number]   MEDICAL RECORD NO.:  [id number]          PATIENT TYPE:  INP   LOCATION:  4711                         FACILITY:  [facility]   PHYSICIAN:  Charlestine Massed, MDDATE OF BIRTH:  [birth year]   DATE OF ADMISSION:  11/29/2008  DATE OF DISCHARGE:  12/18/2008                               DISCHARGE SUMMARY   The patient has been primarily admitted under the service of Dr. Lajoyce Corners,  orthopedics.  The patient developed multiple medical issues requiring  intubation and critical care services during the hospital stay.  I am  dictating the discharge summary on behalf of Dr. Lajoyce Corners for the final  discharge.   PRIMARY CARE PHYSICIAN:  Dr. Reche Dixon in [place].   ORTHOPEDIST:  Dr. Lajoyce Corners of [facility].   PULMONARY CRITICAL CARE:  Dr. Vassie Loll.   INFECTIOUS DISEASE:  Dr. Daiva Eves.   REASON FOR ADMISSION:  Elective admission for primary left ankle  trimalleolar fracture, open reduction internal fixation.   DISCHARGE DIAGNOSES:  1. Left ankle trimalleolar fracture status post ORIF currently stable.  2. Postoperative respiratory failure, resolved.  3. Adult respiratory distress syndrome happening in the postoperative      period with pneumonia, currently resolved.  4. Chronic obstructive pulmonary disease secondary to smoking,      currently resolved.  No need for home oxygen.  5. Paroxysmal atrial fibrillation, currently stable.  Not a candidate      for Coumadin.  6. Diabetes mellitus.  Currently, she is stable.  7. Dehydration, currently resolved.  8. Dysphagia on dysphagia type III diet.  9. Dyslipidemia.  10.Bronchoalveolar lavage showing Acinetobacter baumannii.  No need      for any treatment as per infectious disease as it is just      colonization.  11.Aspiration secondary to dysphagia - aspiration precautions advised      and dysphagia type III diet advised.  12.Mild dementia with memory impairment.   MEDICATIONS AT  DISCHARGE:  1. Aspirin 325 mg p.o. daily.  2. Cardizem CD 120 mg p.o. daily.  3. Mucinex 600 mg p.o. b.i.d.  4. Lantus 22 units subcutaneously nightly.  5. NovoLog sliding scale coverage q.a.c. and q.h.s. at moderate scale      1-15 units per dose.  6. Protonix 40 mg p.o. daily.  7. Zocor 20 mg p.o. nightly.  8. Spiriva 18 mcg inhalation daily.  9. Xopenex 0.63 mg inhalation q.4 hours p.r.n. for shortness of      breath.  10.Atrovent 0.5 mg inhalation q.4 hours p.r.n. for shortness of breath      and wheezing.  11.Thick-It instant food thickener 227 g to be mixed with food before      eating 3 times daily q. meals.  12.Tylenol #3 1 to 2 tablets p.o. q.6 hours p.r.n. for pain,      especially during therapy.   HOSPITAL COURSE BY PROBLEM:  1. Trimalleolar fracture status post open reduction internal fixation      done by orthopedics, surgery done on June 2, currently stable,      being transferred to skilled  nursing facility for further rehab.  2. Pulmonary - chronic obstructive pulmonary disease, adult acute      respiratory distress syndrome, pneumonia - resolved.  The patient      had surgery on June 2 and triad hospitalists were consulted because      the patient was requiring 100% oxygen after surgery, and so the      patient was admitted initially to the intensive care unit and was      found to have pneumonia and was started on IV antibiotics      initially.  Was clinically observed.  Critical care service was      called in and she was following, and the patient was observed in      the ICU initially.  Critical care followed the patient during the      service.  She was initially stabilized and diagnoses was possibly      due to aspiration pneumonia.  Diagnosis was aspiration pneumonia      and was sent to the stepdown unit.  After which, the patient again      aspirated and developed pneumonia, and this time developed adult      respiratory distress syndrome.  In view of  the fact that she has      acute respiratory distress syndrome, the patient was intubated and      put on the ventilator.  She was then managed under critical care      service with sepsis protocol and acute respiratory distress      syndrome protocol and was later extubated and was transferred back      to medical service on December 13, 2008.  I saw this patient on December 14, 2008 for the first time.  The patient was comfortable.  She was      in the stepdown unit, and she has improved considerably after that.      Speech and swallow reevaluation was done and advised dysphagia type      III diet and use nectar-thick liquids.  The patient was started on      Spiriva in view of 40+ years history of smoking for chronic      obstructive pulmonary disease and continued on Xopenex and Atrovent      nebulizers p.r.n.  The patient was started on Mucinex and currently      respiratory status is stable.  The patient's oxygen saturation was      checked on room air and is more than 96%.  As she cannot do a 6-      minute walk test, we did make the patient sit up in the bed      repeatedly, lying down and sitting up a few times, after which O2      saturation was checked, which was still between 93-94%.  In view of      this fact, the patient does not need home oxygen.  The patient is      being discharged.  3. Cardiac - hypertension, atrial fibrillation:  The patient has      paroxysmal atrial fibrillation and was seen by cardiology during      this admission by [facility].  The patient's condition was      evaluated and they advised no need for aspirin and no need for      Coumadin in view of the fact that she is a high fall risk, and  agree with admission with trimalleolar fracture, as well as to the      fall, and so the patient is continued on 325 mg of aspirin daily.      Heart rate has been controlled very well with Cardizem.  Her blood      pressure is very well controlled too at  this time.  She currently      is stable.  Her heart rate is stable for discharge.  She had an      echocardiogram done on June 3, which shows an ejection fraction of      60-65%, no wall motion abnormalities and mildly dilated left atrium      no pericardial effusion, and no significant aortic stenosis or      regurgitation, or mitral stenosis or mitral regurgitation.  There      is a calcified mitral annulus and trivial mitral regurgitation.      The patient's cardiac status is currently stable for discharge.  4. Diabetes mellitus.  Blood sugars were very well controlled in view      of the fact that her intake is poor due to her dysphagia, as well      as her total has come down, the patient's Lantus dose has been      severely reduced.  She is continued on 22 units of Lantus at night      and NovoLog sliding scale, and to follow a diabetic diet.  5. Dyslipidemia.  Continue Zocor in view of the fact that nutrition      status is not optimal.  Reevaluation needs to be done by the      primary medical doctor or the M.D. at the skilled nursing facility      for discontinuation of Zocor if her p.o. intake is not up to the      mark.  6. Mild dementia.  She has significant impairment of memory and though      at times she remembers a lot of things very well, but recent memory      is impaired and she falls in the category between mild to moderate      dementia.  At times, she has episodes of confusion, not agitated,      no hallucinations.  Supportive care.  The patient can be started on      namenda and Aricept.   DISPOSITION:  Discharged to skilled nursing facility.   FOLLOW UP:  1. Follow up with primary medical doctor at the skilled nursing      facility.  2. To follow up with Dr. Lajoyce Corners in 1 to 2 weeks.  3. Cardiology followup to be set up per discussion with the primary      medical doctor.   A total of 40 minutes spent on this discharge.      Charlestine Massed, MD   Electronically Signed     UT/MEDQ  D:  12/18/2008  T:  12/18/2008  Job:  [id number]   cc:   Nadara Mustard, MD  Oretha Milch, MD  Acey Lav, MD

## 2010-11-15 ENCOUNTER — Other Ambulatory Visit: Payer: Self-pay | Admitting: Family Medicine

## 2010-11-15 DIAGNOSIS — E559 Vitamin D deficiency, unspecified: Secondary | ICD-10-CM

## 2010-11-16 ENCOUNTER — Other Ambulatory Visit (INDEPENDENT_AMBULATORY_CARE_PROVIDER_SITE_OTHER): Payer: 59 | Admitting: Family Medicine

## 2010-11-16 DIAGNOSIS — E559 Vitamin D deficiency, unspecified: Secondary | ICD-10-CM

## 2010-11-16 NOTE — Op Note (Signed)
[name], [name]                ACCOUNT NO.:  [id number]   MEDICAL RECORD NO.:  [id number]          PATIENT TYPE:  INP   LOCATION:  1823                         FACILITY:  [facility]   PHYSICIAN:  Kerrin Champagne, M.D.   DATE OF BIRTH:  1929-07-24   DATE OF PROCEDURE:  05/08/2004  DATE OF DISCHARGE:                                 OPERATIVE REPORT   PREOPERATIVE DIAGNOSIS:  Right valgus impacted distal tibia/fibula fracture.   POSTOPERATIVE DIAGNOSIS:  Right valgus impacted distal tibia/fibula  fracture.   PROCEDURE:  Closed reduction of right tibia/fibula fracture, with  application of fiberglass short leg cast with knee immobilizer.   SURGEON:  Kerrin Champagne, M.D.   ASSISTANT:  Wende Neighbors, P.A.-C.   ANESTHESIA:  GOT, Dr. Randa Evens.   ESTIMATED BLOOD LOSS:  0 cc.   DRAINS:  None.   BRIEF CLINICAL HISTORY:  The patient is a 77 year old female with a history  of osteoporosis, diabetes, and hypertension.  She was walking her dog today  and apparently tripped up and fell, sustaining a right distal tibia/fibula  valgus impacted distal leg fracture.  Fracture about 2-3 inches above the  plafond, transverse.  The distal tibia/fibula syndesmosis appears intact,  with a significant comminution of the fibula and impaction of the lateral  portion of the tibia.   INTRAOPERATIVE FINDINGS:  As above.   DESCRIPTION OF PROCEDURE:  After adequate general anesthesia with  orotracheal intubation, the right lower extremity was brought off to the  side of the operating room table, with the knee flexed 90 degrees and  gravity allowed to perform traction on the leg.  Leg placed under  longitudinal traction utilizing the heel and the distal ankle for further  traction and manipulation into varus.  The fracture reduced in this  position, with the leg then internally rotated approximately 10 degrees, in  slight plantar flexion about 15-20 degrees.  With this, a short leg well-  padded  fiberglass cast was applied.  C-arm images obtained with the cast in  place demonstrating reduction of the distal tibia  and fibula fractures, with good maintenance of the joint line  radiographically.  After cast was applied, knee immobilizer was placed.  The  patient was then reactivated, extubated, and returned to the recovery room  in satisfactory condition.  All instrument and sponge counts were correct.      Jame   JEN/MEDQ  D:  05/08/2004  T:  05/08/2004  Job:  [id number]

## 2010-11-16 NOTE — Consult Note (Signed)
[name], [name]                ACCOUNT NO.:  [id number]   MEDICAL RECORD NO.:  [id number]          PATIENT TYPE:  INP   LOCATION:  1823                         FACILITY:  [facility]   PHYSICIAN:  Rosalyn Gess. Norins, M.D. [facility] OF BIRTH:  [birth year]   DATE OF CONSULTATION:  05/07/2004  DATE OF DISCHARGE:                                   CONSULTATION   I was asked by Dr. Otelia Sergeant to see this patient prior to surgery.   IMPRESSION:  1.  Fracture, tibia-fibula requiring operative repair.  2.  Atrial arrhythmia with paroxysmal tachycardia.  3.  Non-insulin-dependent diabetes.  4.  Hypertension.  5.  Hyperlipidemia.  6.  Probable chronic obstructive pulmonary disease.  7.  Osteoporosis.   RECOMMENDATIONS:  1.  Okay for surgery and anesthesia.  2.  Perioperative glucose protocol.  Hold diabetic medications until eating      postoperatively.  3.  Kelly bed postop; watch for SVT versus atrial fibrillation.  4.  Continue routine blood pressure medications.   HISTORY OF PRESENT ILLNESS:  The patient is a 77 year old white female with  history of hypertension, non-insulin-dependent diabetes, hyperlipidemia,  smoker with osteoporosis.  She denies any history of cardiac disease.  She  has had chest pain or other cardiac symptoms.  The patient does report a  history of pneumonia remotely.   While walking her dog this evening, she fell, sustaining a complex fracture  of her distal right lower extremity.  She is now for operative repair.   The patient denies chest pain or chest discomfort.  She has had no  respiratory complaints.  She reports her blood sugars have been reasonably  well controlled.   PAST MEDICAL HISTORY:  The patient has had no prior surgeries except for  delivery of one child.  Her medical illnesses are as outlined above.   CURRENT MEDICATIONS:  1.  Norvasc 10 mg daily.  2.  Lisinopril/HCTZ 20/25 one daily.  3.  Glyburide 5 mg daily.  4.  Lipitor 10 mg daily.  5.   Aspirin 81 mg daily.   HABITS:  Tobacco, three-quarters of a pack per day with greater than 30-pack-  year smoking history.   PHYSICAL EXAMINATION:  VITAL SIGNS:  Temperature 98.8, blood pressure  132/78, pulse 84, respirations 20.  GENERAL:  This is a pleasant, elderly woman lying on a stretcher in no acute  distress.  HEENT:  Grossly normal.  NECK:  Supple.  NODES:  No adenopathy noted in cervical, supraclavicular regions.  CHEST:  No rales, wheezes, or rhonchi.  No increased work of breathing is  noted.  CARDIOVASCULAR:  2+ radial pulses.  Initially was found to have an  irregularly irregular tachycardic exam, but she quickly slowed down to  irregular rhythm at 70 to 72 beats per minute with no PVCs.  BREASTS:  Exam deferred.  ABDOMEN:  Soft.  No guarding or rebound.  PELVIC/RECTAL:  Exams deferred.  EXTREMITIES:  Defer to Dr. Otelia Sergeant.   DATABASE:  A 12-lead electrocardiogram with atrial arrhythmia and no signs  of acute injury, no signs of ischemia.  Chest x-ray revealed increased lung fields, some flattening of the  diaphragm, normal cardiac silhouette.  There is no parenchymal abnormality.  Bone density does seem diminished.   CBC reveals hemoglobin 12.2, hematocrit 34.9, white count 10,600 with normal  differential, platelet count 200,000.  INR was 0.8.  Chemistry: Sodium 137,  potassium 4.4, chloride 105, CO2 25, BUN 27, creatinine 1.1.  Serum glucose  148.  LFTs normal.   IMPRESSION:  1.  Orthopedic:  The patient has fractured tibia-fibula for operative      reduction.  2.  Diabetes.  The patient has been well controlled by her report.  She is      on oral medication only.  Her serum glucose is well controlled at 148.      Plan:  Perioperative diabetic protocol, and withhold oral medications      until the patient is eating a regular diet.  3.  Cardiovascular.  No history of coronary artery disease, but the patient      does have multiple cardiac risk factors  including being postmenopausal,      diabetic, hyperlipidemic, hypertensive, with tobacco abuse.  She does      have an atrial arrhythmia.  Plan:  The patient does have a mildly      increased cardiac risk based on her risk profile, but the EKG does not      suggest ischemia, and I believe she is stable for anesthesia.  I would      recommend telemetry monitoring postoperatively because of concern for      possible paroxysmal supraventricular tachycardia versus risk for atrial      fibrillation.  Will continue her home medication postop.  4.  Pulmonary.  The patient probably has chronic obstructive pulmonary      disease based on her smoking history and x-ray, but she has no      respiratory distress.  Plan:  Routine oxygen in the perioperative      period.  Tobacco cessation counseling.  5.  Osteoporosis.  The patient is stable.   We will follow this patient as an inpatient for consultation.      MEN/MEDQ  D:  05/07/2004  T:  05/08/2004  Job:  [id number]   cc:   Marne A. Milinda Antis, M.D. [facility]

## 2010-11-16 NOTE — Op Note (Signed)
[name], [name]                ACCOUNT NO.:  [id number]   MEDICAL RECORD NO.:  [id number]          PATIENT TYPE:  AMB   LOCATION:  ENDO                         FACILITY:  [facility]   PHYSICIAN:  Iva Boop, M.D. LHCDATE OF BIRTH:  1929-07-20   DATE OF PROCEDURE:  03/14/2005  DATE OF DISCHARGE:                                 OPERATIVE REPORT   PROCEDURE:  Small bowel capsule endoscopy with examination into the right  colon.   DESCRIPTION OF PROCEDURE:  After informed consent, the patient presented to  the endoscopy lab at [facility], where she was administered the  Given capsule.  The study was reviewed, and it was adequate for  interpretation.  Prior to the procedure, she was given prep with MiraLax and  Gatorade as well as Zelnorm 6 mg b.i.d. the day before and 6 mg the day of  the procedure to promote motility to try to look into the right colon.  Prior to this, she has had problems with melena, bleeding, and anemia as  well as heme positive stool.  She had a negative EGD, an unrevealing  incomplete colonoscopy, unrevealing barium enema, and unrevealing  enteroscopy.  She takes Coumadin chronically as well.   FINDINGS:  1.  Complete study into the right colon with good prep and good views.  2.  There was some fresh blood in the proximal small bowel.  3.  Multiple small bowel AVMs.  4.  Some edematous proximal small bowel that was nonspecific, probably in      the jejunum.  5.  Two large right colon AVMs as well.   I think she is bleeding from AVMs.  There is no good evidence for cancer  seen, fortunately.  We need to review her need for Coumadin and consider  repeat colonoscopy, again to the right colon and ablate the AVMs.  The most  likely course of action would be to hold her Coumadin.  We again need to  examine the risks and benefit ratio of that, given her cardiac issues, which  I believe include atrial fibrillation.      Iva Boop, M.D. [facility]  Electronically Signed     CEG/MEDQ  D:  03/15/2005  T:  03/15/2005  Job:  [id number]   cc:   Marne A. Tower, M.D. [facility]  [address], [place]  [place] [address]

## 2010-11-16 NOTE — Consult Note (Signed)
[name], [name]                ACCOUNT NO.:  [id number]   MEDICAL RECORD NO.:  [id number]          PATIENT TYPE:  INP   LOCATION:  3709                         FACILITY:  [facility]   PHYSICIAN:  [name], M.D.   DATE OF BIRTH:  08/01/1929   DATE OF CONSULTATION:  DATE OF DISCHARGE:  05/16/2004                                   CONSULTATION   PRIMARY CARE PHYSICIAN:  [name], M.D. [facility].   ELECTROPHYSIOLOGY CONSULTATION NOTE:  Dr. Antoine Morgan seen this patient for  electrophysiology.   EXTENDING CIRCUMSTANCE:  I tripped walking the dog and broke my leg.   HISTORY OF PRESENT ILLNESS:  Ms. [name] is a 77 year old female.  She was  out walking her dog on May 07, 2004 when she tripped and suffered a  right valgus impacted tibia/fibula fracture.  At the time of this  consultation on May 09, 2004, she is status post closed reduction  yesterday, May 08, 2004.  It was noted on the monitor that she was in a  rapid heart rate on admission with the heart rate in the 170's.  The patient  was not short of breath.  She did not have chest pain.  She had no chest  discomfort.  She had no palpitation, no feeling that her heart was racing,  no syncope or presyncope.  Her QRS on the EKG looked like her sinus rhythm  QRS but varied in amplitude and interval.  This is most likely paroxysmal  atrial fibrillation.  The patient gives a history of fluttery feeling in  the chest which is intermittent but it has never been worked up since she  was age 66.  She says they do not come on often enough to bother about.  In  the case of atrial fibrillation, this would not be dysrhythmia of someone  who was age 24.  The patient was given Lopressor for rate control here at  12.5 b.i.d. and her heart rate varies from rapid ventricular rate to post  termination pauses and bradycardia.  Failing rate control and medical  management may prove difficult without permanent pacemaker.  Thromboembolic  risk factors include hypertension and diabetes which was diagnosed about 20  years ago.   MEDICATIONS:  1.  Norvasc 10 mg daily.  2.  Lisinopril 20 mg daily.  3.  Hydrochlorothiazide 25 mg daily.  4.  Lipitor 10 mg at bedtime.  5.  Glyburide 5 mg daily.  6.  Aspirin 325 mg twice daily.  7.  At [facility], Lopressor 12.5 mg twice daily, now on hold.   The patient lives in [place] alone.  She smokes currently less than one  pack per week.  She walks 2-3 miles a day.   FAMILY HISTORY:  The patient's mother died at age 75 of unknown causes.  Father died at age 46, had a history of coronary artery disease.  She has  two sisters and both died with cancer.  Two of her brothers died secondary  to cancer. She has two brothers who had coronary artery disease, one had  suffered a myocardial  infarction and one is status post coronary artery  bypass graft surgery.   REVIEW OF SYSTEMS:  The patient is not giving any recent history of fever,  chills, night sweats, weight change or adenopathy.  HEENT:  No epistaxis,  hoarseness, vertigo, photophobia.  INTEGUMENT:  No rashes, lesions or non  healing ulcerations.  CARDIOPULMONARY:  No chest pain, shortness of breath,  dyspnea on exertion, no orthopnea, no paroxysmal nocturnal dyspnea, no  edema, no syncope, no presyncope.  The patient feels perhaps some mild  intermittent palpitation.  UROGENITAL:  No urinary frequency or urgency.  No  nocturia to speak of.  NEUROPSYCHIATRIC:  No weakness, numbness, depression  or anxiety.  GASTROINTESTINAL:  No history of GI bleed, peptic ulcer  disease, or gastroesophageal reflux disease.  ENDOCRINE: The patient has  diabetes diagnosed 20 years ago as dictated above.  No heat or cold  intolerance.  MUSCULOSKELETAL:  No arthralgias, joint swelling, deformity or  pain.  The patient does carry a diagnosis of osteoporosis.  All other  systems are negative.   PHYSICAL EXAMINATION:  VITAL SIGNS:  The patient's  temperature is 99.2,  pulse is 60 and regular.  Currently at the time of exam, blood pressure  101/49, respirations 20, oxygen saturation 91% at room air.  EXTREMITIES:  The patient's right lower extremity is tender otherwise no  acute distress.  HEENT:  Normocephalic, atraumatic.  Eyes:  Pupils equal, round, reactive to  light.  Extraocular movements intact.  Sclerae are clear.  Nares without  discharge.  NECK:  Supple without carotid bruits.  No jugulovenous distention.  No  cervical lymphadenopathy.  HEART:  A regular rate and rhythm.  Normal S1 and S2.  No murmur.  LUNGS:  Crackles at both bases, otherwise clear.  ABDOMEN:  Soft, nondistended.  Bowel sounds are present.  No  hepatosplenomegaly.  Abdominal aorta is not pulsatile.  EXTREMITIES:  Right lower extremity is in a short leg cast placed the day  before this consultation on May 08, 2004.  She has good perfusion to  the right lower extremity.  MUSCULOSKELETAL:  Right valgus tibia/fibula fracture as described above.  NEUROLOGICAL:  No neurologic deficits noted.   CT scan of the chest on November 09. 2005:  No pulmonary embolism.  Chest x-  ray May 07, 2004:  No active disease, no edema.  Electrocardiograph  strips from May 09, 2004 show rapid rate in the 170's, QRS looks like  her sinus rhythm QRS, amplitude and spacing vary.  This is paroxysmal atrial  fibrillation.  PT was 12.0, INR was 0.8.  Complete blood count of May 07, 2004:  White cells 10.6, hemoglobin 12.2, hematocrit 34.9, platelets of  200,000.  Serum electrolytes on May 07, 2004:  137 for sodium, 4.4  potassium, chloride 105, carbonate 25, BUN is 27, creatinine 1.1, glucose  148. She had one set of cardiac enzymes on May 09, 2004 at 1305 hours:  CK is 116, CK MB is 1.3, troponin less than 0.01.   IMPRESSION:  1.  Admit with a fracture of the right tibia/fibula. 2.  Paroxysmal supraventricular tachycardia, atrial fibrillation,  uncertain      start date.  She has post termination pauses alternating with      tachycardia arrhythmias on beta blocker therapy.  3.  Type 2 diabetes diagnosed 20 years ago.  4.  Hypertension diagnosed 15 years ago.  5.  Dyslipidemia.  6.  Chronic obstructive pulmonary disease with ongoing tobacco habituation.  7.  Osteoporosis.  8.  Family history of atherosclerotic coronary artery disease.   PLAN:  Start anticoagulation when feasible per orthopedics service with a  paroxysmal atrial fibrillation sensitive to AV nodal block agents.  The  patient may benefit from permanent pacemaker then to medicate.       GM/MEDQ  D:  05/16/2004  T:  05/16/2004  Job:  [id number]   cc:   [facility] Cardiology  [facility] office   [name], M.D.   [name], M.D. [facility]

## 2010-11-16 NOTE — Discharge Summary (Signed)
[name], [name]                ACCOUNT NO.:  [id number]   MEDICAL RECORD NO.:  [id number]          PATIENT TYPE:  INP   LOCATION:  3709                         FACILITY:  [facility]   PHYSICIAN:  Kerrin Champagne, M.D.   DATE OF BIRTH:  Sep 06, 1929   DATE OF ADMISSION:  05/07/2004  DATE OF DISCHARGE:  05/16/2004                                 DISCHARGE SUMMARY   ADMISSION DIAGNOSES:  1.  Right valgus impacted distal tibia/fibula fracture.  2.  Atrial arrhythmia with paroxysmal tachycardia.  3.  Noninsulin-dependent diabetes mellitus.  4.  Hypertension.  5.  Hyperlipidemia.  6.  Chronic obstructive pulmonary disease.  7.  Osteoporosis.   DISCHARGE DIAGNOSES:  1.  Right valgus impacted distal tibia/fibula fracture.  2.  Atrial arrhythmia with paroxysmal tachycardia.  3.  Noninsulin-dependent diabetes mellitus.  4.  Hypertension.  5.  Hyperlipidemia.  6.  Chronic obstructive pulmonary disease.  7.  Osteoporosis.  8.  Atrial fibrillation, stable at discharge with initiation of Coumadin.   PROCEDURE:  On May 08, 2004, the patient underwent closed reduction of  right tibia and fibula fracture with application of fiberglass short leg  cast with knee immobilizer performed by Dr. Kerrin Champagne and assisted by  Wende Neighbors, P.A.C. under general anesthesia.   CONSULTATIONS:  [facility] and [facility].   BRIEF HISTORY:  The patient is a 77 year old white female with history of  osteoporosis, diabetes, and hypertension.  On the day of admission, she fell  while walking her dog and landed in a ditch.  She had immediate onset of  discomfort and deformity of the right ankle.  She was brought to the  emergency room via EMS where x-rays revealed a right valgus impacted distal  tibia and fibula fracture.  The fracture was noted to be two to three inches  above the plafond and traversed.  The distal tibia and fibula syndesmosis  appeared intact with a significant  comminution of the fibula and impaction  of the lateral portion of the tibia.  It was felt that she would require  closed manipulation which would need to be done under general anesthesia.  She was admitted for the procedure.   Prior to surgery, she underwent the usual preoperative labs with a EKG  showing abnormalities.  A consult was obtained from Dr. Rosalyn Gess. Norins  for [facility].  She was felt to be stable for surgical  intervention with atrial arrhythmia with paroxysmal tachycardia.   HOSPITAL COURSE:  Upon admission after her initial medical evaluation, she  was taken to the operating room and underwent the above-stated procedure  under general anesthesia.  Postoperatively, she was placed on a telemetry  bed for monitoring her heart.  Unfortunately, the patient had further  arrhythmias with tachycardia which required her to be placed in the  intensive care unit for a couple of days.  Medications were adjusted.  Unfortunately after being started on rate control medications, she had  difficulty with bradycardia. Multiple agents were utilized.   From a cardiac standpoint, she continued to have arrhythmias  but they were  felt to be stable.  Eventually, she remained in the atrial fibrillation and  was able to be maintained on Pindolol and Coumadin was initiated.  The  pharmacy at the hospital followed her for Coumadin management.  Daily pro  times were drawn while on Coumadin and adjustments in dosage made according  to these values.  The patient was quite slow to respond to the Coumadin and  on the day before discharge her PT was 16.7 with INR of 1.5.   From a medical standpoint, she was treated with sliding scale insulin as  well as her usual dose of glyburide.  Diabetic diet was initiated and  maintained during the hospital stay.  As for her fractured tibia and fibula,  she was allowed strict nonweightbearing for bed to chair transfers and  ambulatory purposes.   During the hospital stay, she did have physical  therapy.  Interruption in physical therapy were caused due to her medical  condition, but prior to discharge, she was able to ambulate as much as 60  feet with a standard walker.   Due to her instability and her cardiac problems as well as concerns  regarding her safety at home, it was felt that she would require short-term  nursing home placement versus subacute care unit.  Unfortunately, no beds  were available on rehabilitation or the subacute unit and therefore FL2 was  faxed to multiple area facilities.  The patient's son assisted in securing a  facility for her to be discharged to until she was stable to return to her  home.  Her cast remained in good condition during the hospital stay.  X-rays  did show some valgus changes to the fracture pattern and therefore she  underwent wedging of her cast on May 14, 2004.   Postwedging x-rays did show better position and alignment of the fracture.  She continued to have good capillary refill and sensation of her toes with  good motion of the toes throughout the hospital stay.  From a pulmonary  standpoint, her COPD was monitored.  She did have several episodes of  shortness of breath which were felt to be related to episodes of arrhythmia  as well as her COPD.  She was treated with nebulizer as needed and oxygen on  an as needed basis.   On May 15, 2004, it was felt that the patient would be stable for  transfer to an assisted living or skilled nursing facility.  As a bed became  available, the patient is being transferred.  At the time of this dictation,  the specific place has not been decided upon by her son.  He will make the  final decision and if she remains stable on May 15, 2004, she will be  transferred.   LABORATORY DATA:  On admission,m chemistry studies were within normal limits with the exception of glucose 148, BUN 27.  Urinalysis showed small  leukocyte  esterase, few epithelial cells, 7 to 10 wbc's, 0 to 2 rbc's, and a  few bacteria with a trace of glucose.  Cardiac markers on admission were all  within normal limits.  Thyroid testing included TSH.  On May 09, 2004  was within normal limits.  CBC on admission with a WBC of 10.6, hemoglobin  12.2, hematocrit 34.9.  Repeat CBC on May 12, 2004 with a hemoglobin  9.9, hematocrit 28.3, and WBC 8.8.  Coagulation studies on admission were  within normal limits.  As stated above, prior  to discharge, PT and INR  continued to be nontherapeutic.  Hemoglobin A1C on May 14, 2004 was  6.0.  Repeat chemistries on May 14, 2004 showed glucose of 136, BUN 30,  remaining values within normal limits.   Chest x-ray on admission showed no active disease, mild atelectasis at the  left lung base.  A CT of the chest on May 09, 2004 showed no evidence of  pulmonary embolus, mucus plugging within the lower lobe rhonchi bilaterally,  left greater than right.  Bibasilar atelectasis, early chronic obstructive  pulmonary disease.  EKGs were monitored throughout the hospital stay as was  her heart rate as she was on telemetry throughout the hospitalization.  On  the day prior to discharge, she was stable in atrial fibrillation.  Transthoracic echocardiogram on May 10, 2004 showed overall left  ventricular function normal, left ventricular ejection fraction estimated  range being 55-65%.   PLAN:  The patient is being transferred to a skilled nursing facility.  There, she should continue to have bed to chair transfers and ambulations.  Physical therapy will need to work with her daily.  She is strictly  nonweightbearing on the right lower extremity.  Her cast is to remain dry  and clean at all times.  She should have elevation of her right lower  extremity when at bed rest.  When she is ambulatory, she should wear her  knee immobilizer on the right as well as her cast.  The patient will need  a  walker for her transfers and ambulation.   X-rays of the right ankle will need to be checked weekly x 2 weeks and then  every other week.  It is anticipated that she will be nonweightbearing on  the right lower extremity for as much as to six to eight weeks.  The patient  should follow up with Dr. Kerrin Champagne in one week following her  discharge.  Arrangements should be made by calling Dr. Guy Sandifer Nitka's  office at [phone].  She will need transportation to and from the office.  From a cardiac standpoint, she will need outpatient follow-up for her  Coumadin management.  She will also need follow up with cardiologist for her  arrhythmia.   DISCHARGE MEDICATIONS:  1.  Sliding scale insulin utilizing Novolin R.  Sliding scale to be CBG 60      to 100 with 0 units; CBG 101 to 150 with 1 unit; CBG 151 to 200 with 2      units; CBG 201 to 250 with 4 units; CBG 251 to 300 with 6 units; CBG 301     to 350 with 8 units; CBG 350 with 10 units and call MD.  2.  Zocor 20 mg p.o. q.d.  3.  Glyburide 5 mg q.d.  4.  Pindolol 10 mg p.o. q.12h.  5.  She is on Coumadin and this should be managed by the outpatient Coumad[facility]      Pharmacy at [facility].  6.  Pain medication of Vicodin 1-2 q.4-6h. p.r.n. pain.  7.  Benadryl 25 mg 1 q.6h. p.r.n. itching.   DISPOSITION:  Questions or concerns regarding her orthopedic status should  be addressed by phoning Dr. Guy Sandifer Nitka's office at [phone].  The  patient has been seen by the [facility] who will follow  her on an outpatient basis for other medical issues including her COPD,  hyperlipidemia, and diabetes.  She should follow up with her primary care  physician as indicated.  She will continue on a diabetic diet.   CONDITION ON DISCHARGE:  Stable.      [facility]   SMV/MEDQ  D:  05/15/2004  T:  05/15/2004  Job:  [id number]

## 2010-11-17 ENCOUNTER — Encounter: Payer: Self-pay | Admitting: Family Medicine

## 2010-11-21 ENCOUNTER — Encounter: Payer: Self-pay | Admitting: Family Medicine

## 2010-11-21 ENCOUNTER — Ambulatory Visit (INDEPENDENT_AMBULATORY_CARE_PROVIDER_SITE_OTHER): Payer: 59 | Admitting: Family Medicine

## 2010-11-21 VITALS — BP 178/82 | HR 72 | Temp 98.0°F | Ht 65.75 in | Wt 153.8 lb

## 2010-11-21 DIAGNOSIS — I10 Essential (primary) hypertension: Secondary | ICD-10-CM

## 2010-11-21 DIAGNOSIS — J309 Allergic rhinitis, unspecified: Secondary | ICD-10-CM

## 2010-11-21 DIAGNOSIS — J329 Chronic sinusitis, unspecified: Secondary | ICD-10-CM | POA: Insufficient documentation

## 2010-11-21 DIAGNOSIS — F17200 Nicotine dependence, unspecified, uncomplicated: Secondary | ICD-10-CM

## 2010-11-21 DIAGNOSIS — E559 Vitamin D deficiency, unspecified: Secondary | ICD-10-CM

## 2010-11-21 MED ORDER — FLUTICASONE PROPIONATE 50 MCG/ACT NA SUSP: 2.0000 | Freq: Every day | NASAL | Status: DC

## 2010-11-21 MED ORDER — AMOXICILLIN-POT CLAVULANATE 875-125 MG PO TABS: 1.0000 | ORAL_TABLET | Freq: Two times a day (BID) | ORAL | Status: AC

## 2010-11-21 NOTE — Progress Notes (Signed)
Subjective:    Patient ID: [name], female    DOB: October 06, 1929, 77 y.o.   MRN: [id number]  HPI Here for f/u of vit D def and also for sinus symptoms Also bp is high today  Sinus trouble with pain in face - either side of nose  Is congested and full under eyes  ? Coming from allergies  Cannot  Blow out --post nasal drip ( cannot breathe well out of her nose) No fever  Not on any all meds   Stress - her dog died -overall sad but doing ok Is lonely  Has good support   Will stop smoking on her own - has thrown all her cig away  Thinks is ready   Took the 8 pills of ergocalciferol -finished that up  Vit D is 33 now  Does not feel much different   Cannot tolerate the calcium 1200 per pill Went back to caltrate 600 ca and 400 D twice a day   bp up today- poss from pain Has ha No cp or sob or edema   Past Medical History  Diagnosis Date  . Allergy     allergic Rhintis  . Hyperlipidemia   . Hypertension   . Osteoporosis   . Atrial fibrillation   . Carotid stenosis 09/2007    Carotid Doppler stable- stable bilateral stenosis 40-59% (from 06)  . Diabetes mellitus     Type II  . Tobacco abuse   . Dementia     Dementia in Hospital //Does O.K. at home.  . Diverticulosis     History   Social History  . Marital Status: Widowed    Spouse Name: N/A    Number of Children: N/A  . Years of Education: N/A   Occupational History  . Not on file.   Social History Main Topics  . Smoking status: Current Everyday Smoker  . Smokeless tobacco: Not on file  . Alcohol Use:   . Drug Use:   . Sexually Active:    Other Topics Concern  . Not on file   Social History Narrative  . No narrative on file            Review of Systems Review of Systems  Constitutional: Negative for fever, appetite change,  and unexpected weight change. pos for fatigue Eyes: Negative for pain and visual disturbance.  ENT pos for nasal cong and facial pain/ no st or ear pain    Respiratory: Negative for cough and shortness of breath.   Cardiovascular: Negative. For cp or palp  Gastrointestinal: Negative for nausea, diarrhea and constipation.  Genitourinary: Negative for urgency and frequency.  Skin: Negative for pallor.  Neurological: Negative for weakness, light-headedness, numbness and headaches.  Hematological: Negative for adenopathy. Does not bruise/bleed easily.  Psychiatric/Behavioral: Negative for dysphoric mood. The patient is not nervous/anxious.          Objective:   Physical Exam  Constitutional: She appears well-developed and well-nourished.  HENT:  Head: Normocephalic and atraumatic.  Right Ear: External ear normal.  Left Ear: External ear normal.  Mouth/Throat: Oropharynx is clear and moist.       Nares very congested bilat maxillary and frontal sinus tenderness Some post nasal drip   Eyes: Conjunctivae and EOM are normal. Pupils are equal, round, and reactive to light.  Neck: Normal range of motion. Neck supple. No JVD present. Carotid bruit is not present. No thyromegaly present.  Cardiovascular: Normal rate, regular rhythm and normal heart sounds.  Pulmonary/Chest: Effort normal and breath sounds normal. No respiratory distress. She has no wheezes.       Diffusely distant bs throughout   Abdominal: Soft. Bowel sounds are normal. She exhibits no mass. There is no tenderness.  Musculoskeletal: She exhibits no edema and no tenderness.  Lymphadenopathy:    She has no cervical adenopathy.  Neurological: She is alert. She has normal reflexes. No cranial nerve deficit. Coordination normal.  Skin: Skin is warm and dry. No rash noted. No erythema. No pallor.  Psychiatric: She has a normal mood and affect.          Assessment & Plan:

## 2010-11-21 NOTE — Assessment & Plan Note (Signed)
bp up today- poss due to malaise from sinus infection and recent high salt intake  Disc diet F/u 1 mo  If not imp - may need to adj tx

## 2010-11-21 NOTE — Patient Instructions (Addendum)
Continue caltrate twice daily Add 2000 iu of vitamin D3 over the counter once daily  If any problems let me know Vit D level is improved  Start flonase nasal spray once daily for congestion  Take augmentin for sinus infection  Drink lots of fluids Avoid salt- blood pressure is high  Follow up in about 1 month for your high blood pressure

## 2010-11-21 NOTE — Assessment & Plan Note (Signed)
In smoker with all congestion tx with augmentin and flonase Disc smoking cessation-pt is ready sympt care disc Update if not imp in 10d or if worse

## 2010-11-21 NOTE — Assessment & Plan Note (Signed)
Seasonal worsening in a smoker Will try flonase F/u 1 mo

## 2010-11-21 NOTE — Assessment & Plan Note (Signed)
Better after high dose course of weekly tx  Now will take 2800 iu total daily Continue to follow Disc imp to bone and general health

## 2010-11-21 NOTE — Assessment & Plan Note (Signed)
Disc in detail risks of smoking and possible outcomes including copd, vascular/ heart disease, cancer , respiratory and sinus infections  Pt voices understanding  Is trying to quit on her own 

## 2010-11-27 ENCOUNTER — Ambulatory Visit: Payer: 59 | Admitting: Family Medicine

## 2010-12-18 ENCOUNTER — Ambulatory Visit (INDEPENDENT_AMBULATORY_CARE_PROVIDER_SITE_OTHER): Payer: 59 | Admitting: Family Medicine

## 2010-12-18 ENCOUNTER — Encounter: Payer: Self-pay | Admitting: Family Medicine

## 2010-12-18 DIAGNOSIS — I10 Essential (primary) hypertension: Secondary | ICD-10-CM

## 2010-12-18 DIAGNOSIS — F17200 Nicotine dependence, unspecified, uncomplicated: Secondary | ICD-10-CM

## 2010-12-18 DIAGNOSIS — F4321 Adjustment disorder with depressed mood: Secondary | ICD-10-CM | POA: Insufficient documentation

## 2010-12-18 DIAGNOSIS — E119 Type 2 diabetes mellitus without complications: Secondary | ICD-10-CM

## 2010-12-18 LAB — RENAL FUNCTION PANEL
Albumin: 4.2 g/dL (ref 3.5–5.2)
BUN: 29 mg/dL — ABNORMAL HIGH (ref 6–23)
Chloride: 107 mEq/L (ref 96–112)
Creatinine, Ser: 1.1 mg/dL (ref 0.4–1.2)
GFR: 49.88 mL/min — ABNORMAL LOW (ref >60.00)
Glucose, Bld: 130 mg/dL — ABNORMAL HIGH (ref 70–99)
Phosphorus: 4.2 mg/dL (ref 2.3–4.6)

## 2010-12-18 NOTE — Patient Instructions (Addendum)
Your blood pressure is much better Check at home only when very relaxed Try to stay active  Continue your vitamin D  If you decide you want to talk to a counselor please let me know  Labs today  Follow up with me in 3 months

## 2010-12-18 NOTE — Assessment & Plan Note (Signed)
Very stable at home a1c today Rev low glycemic diet  Needs to get back to walking

## 2010-12-18 NOTE — Assessment & Plan Note (Signed)
Having a hard time after loss of her dog Offered counseling- she declined for now  Is lonely- aff her mood and self care Is getting more inv with caring for her sister however- enc that

## 2010-12-18 NOTE — Progress Notes (Signed)
Subjective:    Patient ID: [name], female    DOB: [birth year], 77 y.o.   MRN: [id number]  HPI Here for f/u of HTN  bp was way up at last visit 178/82 Improved today No cp or sob or palpitations/ headaches or edema    Today much better at 128/60 Sometimes at home goes up systolic --once as high as 160   Had been going through stress and  Illness at last visit Lots of stress still- is getting by  Declines counseling -- in grief after her dog died Is lonely and needs someone to talk to   Last time was really motivated to quit smoking This has died down  Still cutting down -is not there yet  Now about 1/2ppd  No sob or wheeze or cough  Patient Active Problem List  Diagnoses  . HYPERCHOLESTEROLEMIA  . ANEMIA  . TOBACCO USE  . MORTON'S NEUROMA  . MACULAR DEGENERATION  . HYPERTENSION  . ATRIAL FIBRILLATION  . ALLERGIC RHINITIS  . OSTEOPOROSIS  . DIABETES MELLITUS, TYPE II  . UNSPECIFIED VITAMIN D DEFICIENCY  . Grief reaction   Past Medical History  Diagnosis Date  . Allergy     allergic Rhintis  . Hyperlipidemia   . Hypertension   . Osteoporosis   . Atrial fibrillation   . Carotid stenosis 09/2007    Carotid Doppler stable- stable bilateral stenosis 40-59% (from 06)  . Diabetes mellitus     Type II  . Tobacco abuse   . Dementia     Dementia in Hospital //Does O.K. at home.  . Diverticulosis    Past Surgical History  Procedure Date  . Cardiac catheterization 1990's    clear  . Orif tibia & fibula fractures 05/2004  . Small bowel enteroscopy 03/2005    AVMS   History  Substance Use Topics  . Smoking status: Current Everyday Smoker -- 0.5 packs/day for 50 years    Types: Cigarettes  . Smokeless tobacco: Never Used  . Alcohol Use: No   Family History  Problem Relation Age of Onset  . Stroke Mother   . Diabetes Mother   . Stroke Father     Died from cerebral hemorrhage  . Diabetes Father   . Cancer Sister     colon and stomach cancer  .  Diabetes Brother   . Heart disease Brother   . Cancer Brother     died from lung cancer  . Heart disease Brother     MI  . Cancer Sister     ? liver//brain cancer  . Heart disease Sister   . Diabetes Sister     diabetes and glaucoma  . Cancer Brother     pancreatic cancer  . Diabetes Son    Allergies  Allergen Reactions  . Azithromycin     REACTION: inc blood sugar   Current Outpatient Prescriptions on File Prior to Visit  Medication Sig Dispense Refill  . alendronate (FOSAMAX) 70 MG tablet Take 70 mg by mouth every 7 (seven) days. Take with a full glass of water on an empty stomach.       [name] amLODipine (NORVASC) 10 MG tablet Take 10 mg by mouth daily.        [name] aspirin 81 MG tablet Take 81 mg by mouth daily.        . calcium carbonate (OS-CAL) 600 MG TABS Take 600 mg by mouth 2 (two) times daily with a meal.       .  fluticasone (FLONASE) 50 MCG/ACT nasal spray 2 sprays by Nasal route daily.  16 g  11  . glucose blood (ONE TOUCH ULTRA TEST) test strip 1 each by Other route as needed. Use as instructed Check blood sugar daily and as needed       . glyBURIDE (DIABETA) 5 MG tablet Take 5 mg by mouth daily with breakfast.        . lisinopril (PRINIVIL,ZESTRIL) 5 MG tablet Take 5 mg by mouth daily.        . Multiple Vitamin (MULTIVITAMIN) tablet Take 1 tablet by mouth daily.        [name] oxybutynin (DITROPAN XL) 10 MG 24 hr tablet Take 10 mg by mouth daily. For overactive bladder       . simvastatin (ZOCOR) 20 MG tablet Take 20 mg by mouth daily.            Review of Systems Review of Systems  Constitutional: Negative for fever, appetite change, fatigue and unexpected weight change.  Eyes: Negative for pain and visual disturbance.  Respiratory: Negative for cough and shortness of breath.   Cardiovascular: Negative.   Gastrointestinal: Negative for nausea, diarrhea and constipation.  Genitourinary: Negative for urgency and frequency.  Skin: Negative for pallor.  Neurological:  Negative for weakness, light-headedness, numbness and headaches.  Hematological: Negative for adenopathy. Does not bruise/bleed easily.  Psych-- some lonliness/ pt pt denies severe dep/ no SI         Objective:   Physical Exam  Constitutional: She appears well-developed and well-nourished. No distress.  HENT:  Head: Normocephalic and atraumatic.  Right Ear: External ear normal.  Left Ear: External ear normal.  Eyes: Conjunctivae and EOM are normal. Pupils are equal, round, and reactive to light.  Neck: Normal range of motion. Neck supple. No JVD present. Carotid bruit is not present. Erythema present. No thyromegaly present.  Cardiovascular: Normal rate, regular rhythm, normal heart sounds and intact distal pulses.   Pulmonary/Chest: Effort normal and breath sounds normal. No respiratory distress. She has no wheezes. She exhibits no tenderness.       Diffusely distant bs   Abdominal: Soft. Bowel sounds are normal. She exhibits no distension and no mass. There is no tenderness.  Musculoskeletal: She exhibits no edema and no tenderness.  Lymphadenopathy:    She has no cervical adenopathy.  Neurological: She is alert. She has normal reflexes. Coordination normal.  Skin: Skin is warm and dry. No rash noted. No erythema. No pallor.  Psychiatric: She has a normal mood and affect.       Not tearful Good eye contact and comm skills          Assessment & Plan:

## 2010-12-18 NOTE — Assessment & Plan Note (Signed)
Pt still working on quitting  No imp yet Disc in detail risks of smoking and possible outcomes including copd, vascular/ heart disease, cancer , respiratory and sinus infections  Pt voices understanding

## 2010-12-18 NOTE — Assessment & Plan Note (Signed)
This is much imp  Stress plays a role Disc diet - adv to avoid processed and salty foods- pt not motivated Continue to check at home Lab today F/u 3 mo

## 2010-12-26 ENCOUNTER — Telehealth: Payer: Self-pay

## 2010-12-26 NOTE — Telephone Encounter (Signed)
Left vm for pt to callback 

## 2010-12-26 NOTE — Telephone Encounter (Signed)
Message copied by Patience Musca on Wed Dec 26, 2010 11:02 AM ------      Message from: Roxy Manns A      Created: Sun Dec 23, 2010 12:16 PM       Sugar control is a bit worse -please work on healthy low sugar diet - stay active       Also drink more water      F/u 3 mo as planned

## 2010-12-27 NOTE — Telephone Encounter (Signed)
Left vm for pt to callback 

## 2010-12-31 NOTE — Telephone Encounter (Signed)
Patient notified as instructed by telephone. Pt has appt to see Dr Milinda Antis 01/04/11 at 12noon.

## 2011-01-04 ENCOUNTER — Encounter: Payer: Self-pay | Admitting: Family Medicine

## 2011-01-04 ENCOUNTER — Ambulatory Visit (INDEPENDENT_AMBULATORY_CARE_PROVIDER_SITE_OTHER): Payer: 59 | Admitting: Family Medicine

## 2011-01-04 DIAGNOSIS — T887XXA Unspecified adverse effect of drug or medicament, initial encounter: Secondary | ICD-10-CM

## 2011-01-04 DIAGNOSIS — IMO0002 Reserved for concepts with insufficient information to code with codable children: Secondary | ICD-10-CM | POA: Insufficient documentation

## 2011-01-04 DIAGNOSIS — N3946 Mixed incontinence: Secondary | ICD-10-CM | POA: Insufficient documentation

## 2011-01-04 NOTE — Patient Instructions (Signed)
Stop the ditropan Update me if not feeling better If you want to see a urologist in the future for bladder issue- just let me know

## 2011-01-04 NOTE — Progress Notes (Signed)
Subjective:    Patient ID: [name], female    DOB: Nov 18, 1929, 77 y.o.   MRN: [id number]  HPI Thinks she is having trouble with ditropan  With first bottle -- gave her a little headache - thought from hot weather Now has nasty taste in her mouth and eyes feel dry Also dizzy and constipation   It was helping her bladder problems- at most time (incontinence)  Would rather wear pads however than have side effects   Blood pressure is good  Blood sugars are very good with mostly low 100s and one 127   Patient Active Problem List  Diagnoses  . HYPERCHOLESTEROLEMIA  . ANEMIA  . TOBACCO USE  . MORTON'S NEUROMA  . MACULAR DEGENERATION  . HYPERTENSION  . ATRIAL FIBRILLATION  . ALLERGIC RHINITIS  . OSTEOPOROSIS  . DIABETES MELLITUS, TYPE II  . UNSPECIFIED VITAMIN D DEFICIENCY  . Grief reaction  . Adverse reaction  . Mixed incontinence urge and stress   Past Medical History  Diagnosis Date  . Allergy     allergic Rhintis  . Hyperlipidemia   . Hypertension   . Osteoporosis   . Atrial fibrillation   . Carotid stenosis 09/2007    Carotid Doppler stable- stable bilateral stenosis 40-59% (from 06)  . Diabetes mellitus     Type II  . Tobacco abuse   . Dementia     Dementia in Hospital //Does O.K. at home.  . Diverticulosis    Past Surgical History  Procedure Date  . Cardiac catheterization 1990's    clear  . Orif tibia & fibula fractures 05/2004  . Small bowel enteroscopy 03/2005    AVMS   History  Substance Use Topics  . Smoking status: Current Everyday Smoker -- 0.5 packs/day for 50 years    Types: Cigarettes  . Smokeless tobacco: Never Used  . Alcohol Use: No   Family History  Problem Relation Age of Onset  . Stroke Mother   . Diabetes Mother   . Stroke Father     Died from cerebral hemorrhage  . Diabetes Father   . Cancer Sister     colon and stomach cancer  . Diabetes Brother   . Heart disease Brother   . Cancer Brother     died from lung cancer    . Heart disease Brother     MI  . Cancer Sister     ? liver//brain cancer  . Heart disease Sister   . Diabetes Sister     diabetes and glaucoma  . Cancer Brother     pancreatic cancer  . Diabetes Son    Allergies  Allergen Reactions  . Azithromycin     REACTION: inc blood sugar  . Ditropan Xl     Constipation/ dizziness/ dry mouth   Current Outpatient Prescriptions on File Prior to Visit  Medication Sig Dispense Refill  . alendronate (FOSAMAX) 70 MG tablet Take 70 mg by mouth every 7 (seven) days. Take with a full glass of water on an empty stomach.       [name] amLODipine (NORVASC) 10 MG tablet Take 10 mg by mouth daily.        [name] aspirin 81 MG tablet Take 81 mg by mouth daily.        . calcium carbonate (OS-CAL) 600 MG TABS Take 600 mg by mouth 2 (two) times daily with a meal.       . Cholecalciferol (VITAMIN D3) 2000 UNITS capsule Take 2,000 Units by  mouth daily.        . fluticasone (FLONASE) 50 MCG/ACT nasal spray 2 sprays by Nasal route daily.  16 g  11  . glucose blood (ONE TOUCH ULTRA TEST) test strip 1 each by Other route as needed. Use as instructed Check blood sugar daily and as needed       . glyBURIDE (DIABETA) 5 MG tablet Take 5 mg by mouth daily with breakfast.        . lisinopril (PRINIVIL,ZESTRIL) 5 MG tablet Take 5 mg by mouth daily.        . Multiple Vitamin (MULTIVITAMIN) tablet Take 1 tablet by mouth daily.        . simvastatin (ZOCOR) 20 MG tablet Take 20 mg by mouth daily.             Review of Systems Review of Systems  Constitutional: Negative for fever, appetite change, fatigue and unexpected weight change.  Eyes: Negative for pain and visual disturbance.  ENT pos for dry mouth Respiratory: Negative for cough and shortness of breath.   Cardiovascular: Negative. For cp or sob   Gastrointestinal: Negative for nausea, diarrhea and constipation.  Genitourinary: Negative for urgency and frequency.  Skin: Negative for pallor. or rash  Neurological:  Negative for weakness, light-, numbness and headaches. dizzy on ditropan Hematological: Negative for adenopathy. Does not bruise/bleed easily.  Psychiatric/Behavioral: Negative for dysphoric mood. The patient is not nervous/anxious.          Objective:   Physical Exam  Constitutional: She appears well-developed and well-nourished. No distress.  HENT:  Head: Normocephalic and atraumatic.  Mouth/Throat: Oropharynx is clear and moist.  Eyes: Conjunctivae and EOM are normal. Pupils are equal, round, and reactive to light.       No nystagmus   Neck: Normal range of motion. Neck supple. No JVD present. No thyromegaly present.  Cardiovascular: Normal rate, regular rhythm and normal heart sounds.   Pulmonary/Chest: Effort normal and breath sounds normal. No respiratory distress. She has no wheezes.  Abdominal: Soft. Bowel sounds are normal. She exhibits no distension. There is no tenderness.  Musculoskeletal: She exhibits no edema.  Lymphadenopathy:    She has no cervical adenopathy.  Neurological: She is alert. She has normal reflexes. Coordination normal.       No tremor   Skin: Skin is warm and dry. No rash noted. No erythema. No pallor.  Psychiatric: She has a normal mood and affect.          Assessment & Plan:

## 2011-01-04 NOTE — Assessment & Plan Note (Signed)
To medication - ditropan  Common side eff - dizziness/ dry mouth/ constip Decided cons outweigh pros Pt does not want to try other med of same class Will consider urol eval if incontinence worsens

## 2011-08-05 ENCOUNTER — Other Ambulatory Visit: Payer: Self-pay | Admitting: *Deleted

## 2011-08-05 MED ORDER — GLYBURIDE 5 MG PO TABS: 5.0000 mg | ORAL_TABLET | Freq: Every day | ORAL | Status: DC

## 2011-08-05 MED ORDER — SIMVASTATIN 20 MG PO TABS: 20.0000 mg | ORAL_TABLET | Freq: Every day | ORAL | Status: DC

## 2011-08-05 MED ORDER — AMLODIPINE BESYLATE 10 MG PO TABS: 10.0000 mg | ORAL_TABLET | Freq: Every day | ORAL | Status: DC

## 2011-08-05 MED ORDER — LISINOPRIL 5 MG PO TABS: 5.0000 mg | ORAL_TABLET | Freq: Every day | ORAL | Status: DC

## 2012-03-31 DIAGNOSIS — D649 Anemia, unspecified: Secondary | ICD-10-CM

## 2012-03-31 HISTORY — DX: Anemia, unspecified: D64.9

## 2012-04-21 ENCOUNTER — Emergency Department (HOSPITAL_COMMUNITY): Payer: PRIVATE HEALTH INSURANCE

## 2012-04-21 ENCOUNTER — Encounter (HOSPITAL_COMMUNITY): Payer: Self-pay | Admitting: Physical Medicine and Rehabilitation

## 2012-04-21 ENCOUNTER — Inpatient Hospital Stay (HOSPITAL_COMMUNITY)
Admission: EM | Admit: 2012-04-21 | Discharge: 2012-05-05 | DRG: 291 | Disposition: A | Discharge status: Skilled Nursing Facility | Payer: PRIVATE HEALTH INSURANCE | Attending: Cardiology | Admitting: Cardiology

## 2012-04-21 DIAGNOSIS — E876 Hypokalemia: Secondary | ICD-10-CM | POA: Diagnosis present

## 2012-04-21 DIAGNOSIS — F70 Mild intellectual disabilities: Secondary | ICD-10-CM | POA: Diagnosis present

## 2012-04-21 DIAGNOSIS — I509 Heart failure, unspecified: Secondary | ICD-10-CM | POA: Diagnosis present

## 2012-04-21 DIAGNOSIS — I5031 Acute diastolic (congestive) heart failure: Principal | ICD-10-CM | POA: Diagnosis present

## 2012-04-21 DIAGNOSIS — R5381 Other malaise: Secondary | ICD-10-CM | POA: Diagnosis present

## 2012-04-21 DIAGNOSIS — J9620 Acute and chronic respiratory failure, unspecified whether with hypoxia or hypercapnia: Secondary | ICD-10-CM | POA: Diagnosis present

## 2012-04-21 DIAGNOSIS — J449 Chronic obstructive pulmonary disease, unspecified: Secondary | ICD-10-CM

## 2012-04-21 DIAGNOSIS — F0280 Dementia in other diseases classified elsewhere without behavioral disturbance: Secondary | ICD-10-CM | POA: Diagnosis present

## 2012-04-21 DIAGNOSIS — E119 Type 2 diabetes mellitus without complications: Secondary | ICD-10-CM | POA: Diagnosis present

## 2012-04-21 DIAGNOSIS — R1310 Dysphagia, unspecified: Secondary | ICD-10-CM | POA: Diagnosis present

## 2012-04-21 DIAGNOSIS — F17200 Nicotine dependence, unspecified, uncomplicated: Secondary | ICD-10-CM | POA: Diagnosis present

## 2012-04-21 DIAGNOSIS — Z87891 Personal history of nicotine dependence: Secondary | ICD-10-CM | POA: Diagnosis present

## 2012-04-21 DIAGNOSIS — E785 Hyperlipidemia, unspecified: Secondary | ICD-10-CM | POA: Diagnosis present

## 2012-04-21 DIAGNOSIS — I5032 Chronic diastolic (congestive) heart failure: Secondary | ICD-10-CM

## 2012-04-21 DIAGNOSIS — Z833 Family history of diabetes mellitus: Secondary | ICD-10-CM

## 2012-04-21 DIAGNOSIS — Z7982 Long term (current) use of aspirin: Secondary | ICD-10-CM

## 2012-04-21 DIAGNOSIS — Z23 Encounter for immunization: Secondary | ICD-10-CM

## 2012-04-21 DIAGNOSIS — E1165 Type 2 diabetes mellitus with hyperglycemia: Secondary | ICD-10-CM | POA: Diagnosis present

## 2012-04-21 DIAGNOSIS — F0390 Unspecified dementia without behavioral disturbance: Secondary | ICD-10-CM

## 2012-04-21 DIAGNOSIS — M810 Age-related osteoporosis without current pathological fracture: Secondary | ICD-10-CM | POA: Diagnosis present

## 2012-04-21 DIAGNOSIS — J189 Pneumonia, unspecified organism: Secondary | ICD-10-CM | POA: Diagnosis present

## 2012-04-21 DIAGNOSIS — E7800 Pure hypercholesterolemia, unspecified: Secondary | ICD-10-CM | POA: Diagnosis present

## 2012-04-21 DIAGNOSIS — IMO0002 Reserved for concepts with insufficient information to code with codable children: Secondary | ICD-10-CM | POA: Diagnosis present

## 2012-04-21 DIAGNOSIS — J441 Chronic obstructive pulmonary disease with (acute) exacerbation: Secondary | ICD-10-CM | POA: Diagnosis present

## 2012-04-21 DIAGNOSIS — I10 Essential (primary) hypertension: Secondary | ICD-10-CM | POA: Diagnosis present

## 2012-04-21 DIAGNOSIS — I4891 Unspecified atrial fibrillation: Secondary | ICD-10-CM

## 2012-04-21 DIAGNOSIS — Z8249 Family history of ischemic heart disease and other diseases of the circulatory system: Secondary | ICD-10-CM

## 2012-04-21 DIAGNOSIS — I480 Paroxysmal atrial fibrillation: Secondary | ICD-10-CM | POA: Diagnosis present

## 2012-04-21 HISTORY — DX: Angiodysplasia of colon without hemorrhage: K55.20

## 2012-04-21 HISTORY — DX: Paroxysmal atrial fibrillation: I48.0

## 2012-04-21 HISTORY — DX: Gastrointestinal hemorrhage, unspecified: K92.2

## 2012-04-21 HISTORY — DX: Chronic obstructive pulmonary disease, unspecified: J44.9

## 2012-04-21 HISTORY — DX: Anemia, unspecified: D64.9

## 2012-04-21 HISTORY — DX: Chronic diastolic (congestive) heart failure: I50.32

## 2012-04-21 HISTORY — DX: Type 2 diabetes mellitus without complications: E11.9

## 2012-04-21 HISTORY — DX: Chronic respiratory failure, unspecified whether with hypoxia or hypercapnia: J96.10

## 2012-04-21 LAB — POCT I-STAT, CHEM 8
BUN: 49 mg/dL — ABNORMAL HIGH (ref 6–23)
Calcium, Ion: 1.17 mmol/L (ref 1.13–1.30)
Chloride: 99 mEq/L (ref 96–112)
Creatinine, Ser: 1.2 mg/dL — ABNORMAL HIGH (ref 0.50–1.10)
Glucose, Bld: 278 mg/dL — ABNORMAL HIGH (ref 70–99)
HCT: 37 % (ref 36.0–46.0)
Hemoglobin: 12.6 g/dL (ref 12.0–15.0)
Potassium: 4.3 mEq/L (ref 3.5–5.1)
Sodium: 137 mEq/L (ref 135–145)
TCO2: 29 mmol/L (ref 0–100)

## 2012-04-21 LAB — CBC WITH DIFFERENTIAL/PLATELET
Basophils Absolute: 0 10*3/uL (ref 0.0–0.1)
Basophils Relative: 0 % (ref 0–1)
Eosinophils Absolute: 0 10*3/uL (ref 0.0–0.7)
Eosinophils Relative: 0 % (ref 0–5)
HCT: 36.9 % (ref 36.0–46.0)
Lymphocytes Relative: 12 % (ref 12–46)
MCH: 30.7 pg (ref 26.0–34.0)
MCHC: 33.1 g/dL (ref 30.0–36.0)
MCV: 92.9 fL (ref 78.0–100.0)
Monocytes Absolute: 0.9 10*3/uL (ref 0.1–1.0)
Platelets: 181 10*3/uL (ref 150–400)
RDW: 13 % (ref 11.5–15.5)

## 2012-04-21 LAB — GLUCOSE, CAPILLARY

## 2012-04-21 LAB — POCT I-STAT TROPONIN I: Troponin i, poc: 0.01 ng/mL (ref 0.00–0.08)

## 2012-04-21 LAB — MRSA PCR SCREENING: MRSA by PCR: NEGATIVE

## 2012-04-21 LAB — PRO B NATRIURETIC PEPTIDE: Pro B Natriuretic peptide (BNP): 6141 pg/mL — ABNORMAL HIGH (ref 0–450)

## 2012-04-21 MED ORDER — CALCIUM CARBONATE 600 MG PO TABS
600.0000 mg | ORAL_TABLET | Freq: Two times a day (BID) | ORAL | Status: DC
  Filled 2012-04-21: qty 1

## 2012-04-21 MED ORDER — ADULT MULTIVITAMIN W/MINERALS CH
1.0000 | ORAL_TABLET | Freq: Every day | ORAL | Status: DC
  Administered 2012-04-21 – 2012-05-05 (×14): 1 via ORAL
  Filled 2012-04-21 (×16): qty 1

## 2012-04-21 MED ORDER — INFLUENZA VIRUS VACC SPLIT PF IM SUSP
0.5000 mL | INTRAMUSCULAR | Status: AC
  Administered 2012-04-22: 0.5 mL via INTRAMUSCULAR
  Filled 2012-04-21: qty 0.5

## 2012-04-21 MED ORDER — PNEUMOCOCCAL VAC POLYVALENT 25 MCG/0.5ML IJ INJ
0.5000 mL | INJECTION | INTRAMUSCULAR | Status: AC
  Administered 2012-04-22: 0.5 mL via INTRAMUSCULAR
  Filled 2012-04-21: qty 0.5

## 2012-04-21 MED ORDER — GLYBURIDE 5 MG PO TABS
5.0000 mg | ORAL_TABLET | Freq: Every day | ORAL | Status: DC
  Administered 2012-04-22: 5 mg via ORAL
  Filled 2012-04-21 (×2): qty 1

## 2012-04-21 MED ORDER — LEVALBUTEROL HCL 0.63 MG/3ML IN NEBU
0.6300 mg | INHALATION_SOLUTION | Freq: Four times a day (QID) | RESPIRATORY_TRACT | Status: DC | PRN
  Administered 2012-04-21: 0.63 mg via RESPIRATORY_TRACT
  Filled 2012-04-21: qty 3

## 2012-04-21 MED ORDER — ASPIRIN 81 MG PO CHEW
81.0000 mg | CHEWABLE_TABLET | Freq: Every day | ORAL | Status: DC
  Administered 2012-04-21 – 2012-05-05 (×15): 81 mg via ORAL
  Filled 2012-04-21 (×14): qty 1

## 2012-04-21 MED ORDER — NITROGLYCERIN 0.4 MG SL SUBL: 0.4000 mg | SUBLINGUAL_TABLET | SUBLINGUAL | Status: DC | PRN

## 2012-04-21 MED ORDER — CALCIUM CARBONATE 1250 (500 CA) MG PO TABS
1.0000 | ORAL_TABLET | Freq: Two times a day (BID) | ORAL | Status: DC
  Administered 2012-04-22 – 2012-05-01 (×17): 500 mg via ORAL
  Filled 2012-04-21 (×21): qty 1

## 2012-04-21 MED ORDER — ACETAMINOPHEN 325 MG PO TABS
650.0000 mg | ORAL_TABLET | ORAL | Status: DC | PRN
  Filled 2012-04-21: qty 2

## 2012-04-21 MED ORDER — IPRATROPIUM BROMIDE 0.02 % IN SOLN
0.5000 mg | Freq: Four times a day (QID) | RESPIRATORY_TRACT | Status: DC
  Administered 2012-04-21: 0.5 mg via RESPIRATORY_TRACT
  Filled 2012-04-21 (×2): qty 2.5

## 2012-04-21 MED ORDER — HEPARIN BOLUS VIA INFUSION
2000.0000 [IU] | Freq: Once | INTRAVENOUS | Status: AC
  Administered 2012-04-21: 2000 [IU] via INTRAVENOUS
  Filled 2012-04-21: qty 2000

## 2012-04-21 MED ORDER — SIMVASTATIN 20 MG PO TABS: 20.0000 mg | ORAL_TABLET | Freq: Every day | ORAL | Status: DC

## 2012-04-21 MED ORDER — ONDANSETRON HCL 4 MG/2ML IJ SOLN
4.0000 mg | Freq: Four times a day (QID) | INTRAMUSCULAR | Status: DC | PRN
  Administered 2012-05-03: 4 mg via INTRAVENOUS
  Filled 2012-04-21: qty 2

## 2012-04-21 MED ORDER — ATORVASTATIN CALCIUM 10 MG PO TABS
10.0000 mg | ORAL_TABLET | Freq: Every day | ORAL | Status: DC
  Administered 2012-04-21 – 2012-05-04 (×12): 10 mg via ORAL
  Filled 2012-04-21 (×15): qty 1

## 2012-04-21 MED ORDER — HEPARIN (PORCINE) IN NACL 100-0.45 UNIT/ML-% IJ SOLN
1800.0000 [IU]/h | INTRAMUSCULAR | Status: DC
  Administered 2012-04-21: 950 [IU]/h via INTRAVENOUS
  Administered 2012-04-23: 1650 [IU]/h via INTRAVENOUS
  Filled 2012-04-21 (×6): qty 250

## 2012-04-21 MED ORDER — FUROSEMIDE 10 MG/ML IJ SOLN
40.0000 mg | Freq: Two times a day (BID) | INTRAMUSCULAR | Status: DC
  Administered 2012-04-21 – 2012-04-25 (×8): 40 mg via INTRAVENOUS
  Filled 2012-04-21 (×12): qty 4

## 2012-04-21 MED ORDER — FUROSEMIDE 10 MG/ML IJ SOLN
60.0000 mg | Freq: Once | INTRAMUSCULAR | Status: AC
  Administered 2012-04-21: 60 mg via INTRAVENOUS
  Filled 2012-04-21: qty 6

## 2012-04-21 MED ORDER — DILTIAZEM HCL 100 MG IV SOLR
2.5000 mg/h | INTRAVENOUS | Status: DC
  Administered 2012-04-21: 15 mg/h via INTRAVENOUS
  Administered 2012-04-22: 5 mg/h via INTRAVENOUS
  Administered 2012-04-22 (×2): 15 mg/h via INTRAVENOUS
  Administered 2012-04-24: 5 mg/h via INTRAVENOUS
  Filled 2012-04-21 (×2): qty 100

## 2012-04-21 MED ORDER — DILTIAZEM HCL 100 MG IV SOLR
5.0000 mg/h | Freq: Once | INTRAVENOUS | Status: AC
  Administered 2012-04-21: 5 mg/h via INTRAVENOUS

## 2012-04-21 MED ORDER — INSULIN ASPART 100 UNIT/ML ~~LOC~~ SOLN
0.0000 [IU] | Freq: Three times a day (TID) | SUBCUTANEOUS | Status: DC
  Administered 2012-04-22: 11 [IU] via SUBCUTANEOUS
  Administered 2012-04-22: 3 [IU] via SUBCUTANEOUS
  Administered 2012-04-23: 11 [IU] via SUBCUTANEOUS
  Administered 2012-04-23: 3 [IU] via SUBCUTANEOUS
  Administered 2012-04-24: 5 [IU] via SUBCUTANEOUS
  Administered 2012-04-24 – 2012-04-25 (×2): 3 [IU] via SUBCUTANEOUS
  Administered 2012-04-25: 2 [IU] via SUBCUTANEOUS
  Administered 2012-04-25 – 2012-04-26 (×2): 3 [IU] via SUBCUTANEOUS
  Administered 2012-04-26: 5 [IU] via SUBCUTANEOUS
  Administered 2012-04-26: 3 [IU] via SUBCUTANEOUS
  Administered 2012-04-27: 8 [IU] via SUBCUTANEOUS
  Administered 2012-04-27 – 2012-05-01 (×4): 2 [IU] via SUBCUTANEOUS
  Administered 2012-05-01 – 2012-05-03 (×2): 3 [IU] via SUBCUTANEOUS
  Administered 2012-05-04 – 2012-05-05 (×2): 2 [IU] via SUBCUTANEOUS
  Administered 2012-05-05: 3 [IU] via SUBCUTANEOUS

## 2012-04-21 MED ORDER — VITAMIN D3 25 MCG (1000 UNIT) PO TABS
2000.0000 [IU] | ORAL_TABLET | Freq: Every day | ORAL | Status: DC
  Administered 2012-04-21 – 2012-05-01 (×11): 2000 [IU] via ORAL
  Filled 2012-04-21 (×11): qty 2

## 2012-04-21 NOTE — ED Provider Notes (Signed)
History     CSN: [id number]  Arrival date & time 04/21/12  1232   First MD Initiated Contact with Patient 04/21/12 1247      Chief Complaint  Patient presents with  . Shortness of Breath     HPI Pt presents to department via [facility] for evaluation of SOB and afib with RVR. Symptoms ongoing x1 days. Rhonchi heard all lung fields. Speaking short sentences upon arrival. 20g R wrist. She is conscious alert and oriented x4. Denies pain.  Past Medical History  Diagnosis Date  . Allergy     allergic Rhintis  . Hyperlipidemia   . Hypertension   . Osteoporosis   . PAF (paroxysmal atrial fibrillation)     a. dates back > 5 yrs, prev on coumadin->d/c 2/2 GIB;  b. 12/2011 Echo:  EF 60-65%, nl wall motion Triv MR/TR.  [name] Carotid stenosis     a. 09/2007: Carotid Doppler stable- stable bilateral stenosis 40-59% (from 06)  . Type II diabetes mellitus   . Tobacco abuse     a. ongoing - 50+ pack yr hx.  . Dementia     Dementia in Hospital //Does O.K. at home.  . Diverticulosis   . GI bleeding     a. when on coumadin in 2006 - colonoscopy @ that time showed 2 large right colon avm's, multiple small bowel avm's, fresh blood in prox small bowel.  . AVM (arteriovenous malformation) of colon     a. 2006 colonscopy    Past Surgical History  Procedure Date  . Cardiac catheterization 1990's    clear  . Orif tibia & fibula fractures 05/2004  . Small bowel enteroscopy 03/2005    AVMS    Family History  Problem Relation Age of Onset  . Stroke Mother     a. believes she died suddenly @ 25.  . Diabetes Mother   . Stroke Father     Died from cerebral hemorrhage @ 80  . Diabetes Father   . Cancer Sister     colon and stomach cancer  . Diabetes Brother   . Heart disease Brother   . Cancer Brother     died from lung cancer  . Heart disease Brother     MI  . Cancer Sister     ? liver//brain cancer  . Heart disease Sister   . Diabetes Sister     diabetes and glaucoma  . Cancer Brother       pancreatic cancer  . Diabetes Son     History  Substance Use Topics  . Smoking status: Current Every Day Smoker -- 0.5 packs/day for 50 years    Types: Cigarettes  . Smokeless tobacco: Never Used  . Alcohol Use: No    OB History    Grav Para Term Preterm Abortions TAB SAB Ect Mult Living                  Review of Systems Level V caveat Allergies  Azithromycin; Codeine; Coumadin; and Oxybutynin chloride er  Home Medications   No current outpatient prescriptions on file.  BP 104/51  Pulse 107  Temp 99 F (37.2 C) (Oral)  Resp 36  Ht 5\' 6"  (1.676 m)  Wt 149 lb 11.1 oz (67.9 kg)  BMI 24.16 kg/m2  SpO2 90%  Physical Exam  Nursing note and vitals reviewed. Constitutional: She is oriented to person, place, and time. She appears well-developed and well-nourished. No distress.  HENT:  Head: Normocephalic and atraumatic.  Eyes: Pupils are equal, round, and reactive to light.  Neck: Normal range of motion.  Cardiovascular: Intact distal pulses.  An irregularly irregular rhythm present. Tachycardia present.   Pulmonary/Chest: No respiratory distress. She has rales.  Abdominal: Normal appearance. She exhibits no distension. There is no tenderness. There is no rebound.  Musculoskeletal: Normal range of motion.  Neurological: She is alert and oriented to person, place, and time. No cranial nerve deficit.  Skin: Skin is warm and dry. No rash noted.  Psychiatric: She has a normal mood and affect. Her behavior is normal.    ED Course  Procedures (including critical care time)  Date: 04/21/2012  Rate: 135  Rhythm: Atrial fibrillation with rapid ventricular response  QRS Axis: normal  Intervals: normal  ST/T Wave abnormalities: normal  Conduction Disutrbances: none  Narrative Interpretation: Abnormal EKG CRITICAL CARE Performed by: Nelva Nay L   Total critical care time: 30 min  Critical care time was exclusive of separately billable procedures and treating  other patients.  Critical care was necessary to treat or prevent imminent or life-threatening deterioration.  Critical care was time spent personally by me on the following activities: development of treatment plan with patient and/or surrogate as well as nursing, discussions with consultants, evaluation of patient's response to treatment, examination of patient, obtaining history from patient or surrogate, ordering and performing treatments and interventions, ordering and review of laboratory studies, ordering and review of radiographic studies, pulse oximetry and re-evaluation of patient's condition.  Medications  cholecalciferol (VITAMIN D) tablet 2,000 Units (2000 Units Oral Given 04/22/12 1117)  aspirin chewable tablet 81 mg (81 mg Oral Given 04/22/12 1118)  multivitamin with minerals tablet 1 tablet (1 tablet Oral Given 04/22/12 1118)  nitroGLYCERIN (NITROSTAT) SL tablet 0.4 mg (not administered)  acetaminophen (TYLENOL) tablet 650 mg (not administered)  ondansetron (ZOFRAN) injection 4 mg (not administered)  insulin aspart (novoLOG) injection 0-15 Units (0 Units Subcutaneous Not Given 04/22/12 1700)  diltiazem (CARDIZEM) 100 mg in dextrose 5 % 100 mL infusion (2.5 mg/hr Intravenous Rate/Dose Change 04/23/12 0000)  furosemide (LASIX) injection 40 mg (40 mg Intravenous Given 04/22/12 1812)  atorvastatin (LIPITOR) tablet 10 mg (10 mg Oral Given 04/22/12 1812)  heparin ADULT infusion 100 units/mL (25000 units/250 mL) (1450 Units/hr Intravenous Rate/Dose Verify 04/22/12 2100)  calcium carbonate (OS-CAL - dosed in mg of elemental calcium) tablet 500 mg of elemental calcium (500 mg of elemental calcium Oral Given 04/22/12 1812)  antiseptic oral rinse (BIOTENE) solution 15 mL (15 mL Mouth Rinse Given 04/22/12 2231)  levalbuterol (XOPENEX) nebulizer solution 0.63 mg (0.63 mg Nebulization Given 04/22/12 2023)  budesonide (PULMICORT) nebulizer solution 0.25 mg (0.25 mg Nebulization Given 04/22/12  2021)  ipratropium (ATROVENT) nebulizer solution 0.5 mg (0.5 mg Nebulization Given 04/22/12 2021)  nicotine (NICODERM CQ - dosed in mg/24 hours) patch 21 mg (21 mg Transdermal Patch Applied 04/22/12 1314)  insulin aspart (novoLOG) injection 0-5 Units (0 Units Subcutaneous Not Given 04/22/12 2222)  insulin glargine (LANTUS) injection 10 Units (10 Units Subcutaneous Given 04/22/12 2230)  insulin aspart (novoLOG) injection 3 Units (3 Units Subcutaneous Given 04/22/12 1813)  levofloxacin (LEVAQUIN) IVPB 750 mg (not administered)  furosemide (LASIX) injection 40 mg (not administered)  diltiazem (CARDIZEM) 100 mg in dextrose 5 % 100 mL infusion (10 mg/hr Intravenous Rate/Dose Change 04/21/12 1623)  furosemide (LASIX) injection 60 mg (60 mg Intravenous Given 04/21/12 1537)  heparin bolus via infusion 2,000 Units (2000 Units Intravenous Given 04/21/12 1830)  pneumococcal 23 valent vaccine (PNU-IMMUNE) injection 0.5  mL (0.5 mL Intramuscular Given 04/22/12 1118)  influenza  inactive virus vaccine (FLUZONE/FLUARIX) injection 0.5 mL (0.5 mL Intramuscular Given 04/22/12 1120)  heparin bolus via infusion 2,000 Units (2000 Units Intravenous Given 04/22/12 0700)      Labs Reviewed  PRO B NATRIURETIC PEPTIDE - Abnormal; Notable for the following:    Pro B Natriuretic peptide (BNP) 6141.0 (*)     All other components within normal limits  CBC WITH DIFFERENTIAL - Abnormal; Notable for the following:    Monocytes Relative 14 (*)     All other components within normal limits  POCT I-STAT, CHEM 8 - Abnormal; Notable for the following:    BUN 49 (*)     Creatinine, Ser 1.20 (*)     Glucose, Bld 278 (*)     All other components within normal limits  BASIC METABOLIC PANEL - Abnormal; Notable for the following:    Chloride 95 (*)     CO2 35 (*)     Glucose, Bld 221 (*)     BUN 42 (*)     GFR calc non Af Amer 63 (*)     GFR calc Af Amer 73 (*)     All other components within normal limits  GLUCOSE,  CAPILLARY - Abnormal; Notable for the following:    Glucose-Capillary 242 (*)     All other components within normal limits  GLUCOSE, CAPILLARY - Abnormal; Notable for the following:    Glucose-Capillary 307 (*)     All other components within normal limits  HEPARIN LEVEL (UNFRACTIONATED) - Abnormal; Notable for the following:    Heparin Unfractionated <0.10 (*)     All other components within normal limits  HEPARIN LEVEL (UNFRACTIONATED) - Abnormal; Notable for the following:    Heparin Unfractionated 0.22 (*)     All other components within normal limits  GLUCOSE, CAPILLARY - Abnormal; Notable for the following:    Glucose-Capillary 198 (*)     All other components within normal limits  GLUCOSE, CAPILLARY - Abnormal; Notable for the following:    Glucose-Capillary 305 (*)     All other components within normal limits  GLUCOSE, CAPILLARY - Abnormal; Notable for the following:    Glucose-Capillary 196 (*)     All other components within normal limits  POCT I-STAT TROPONIN I  MRSA PCR SCREENING  TROPONIN I  TROPONIN I  TROPONIN I  CBC  LIPID PANEL  GLUCOSE, CAPILLARY  HEMOGLOBIN A1C  CULTURE, EXPECTORATED SPUTUM-ASSESSMENT  HEPARIN LEVEL (UNFRACTIONATED)  BASIC METABOLIC PANEL   Dg Chest 2 View  04/22/2012  *RADIOLOGY REPORT*  Clinical Data: Follow up possible pneumonia  CHEST - 2 VIEW  Comparison: 04/21/2012  Findings: Multifocal patchy airspace opacities, stable versus mildly increased, possibly reflecting multifocal pneumonia or asymmetric interstitial edema.  Small left pleural effusion.  No pneumothorax.  The heart is top normal in size.  Degenerative changes of the visualized thoracolumbar spine.  IMPRESSION: Multifocal patchy airspace opacities, stable versus mildly increased, possibly reflecting multifocal pneumonia or asymmetric interstitial edema.  Small left pleural effusion.   Original Report Authenticated By: Charline Bills, M.D.    Dg Chest Portable 1  View  04/21/2012  *RADIOLOGY REPORT*  Clinical Data: SOB, cough, congestion, diabetic, Hx of pna, smoker.  PORTABLE CHEST - 1 VIEW  Comparison: 12/16/2008  Findings: The patient is rotated to the left on today's exam, resulting in reduced diagnostic sensitivity and specificity. Patchy airspace opacities noted at the left lung base and in the left  mid lung, and to a lesser extent of the right medial lung base.  Indistinct pulmonary vasculature noted.  Atherosclerotic calcification of the aortic arch is observed.  The component of the retrocardiac density and associated lucency could conceivably reflect a hiatal hernia although hiatal hernia was not demonstrated on the prior chest CT of 12/08/2008.  Mild cardiomegaly suspected.  IMPRESSION:  1.  Patchy airspace opacities especially confluent at the left lung base but also in the left mid lung and right lung base, potentially from asymmetric edema or bilateral pneumonia. 2.  Small left pleural effusion is suspected. 3.  Mild cardiomegaly and pulmonary venous hypertension.   Original Report Authenticated By: Dellia Cloud, M.D.    Dg Swallowing Func-speech Pathology  04/22/2012  Vivi Ferns McCoy, CCC-SLP     04/22/2012  2:51 PM Objective Swallowing Evaluation: Modified Barium Swallowing Study   Patient Details  Name: SANDA DEJOY MRN: [id number] Date of Birth: April 13, 1930  Today's Date: 04/22/2012 Time: 1420-1440 SLP Time Calculation (min): 20 min  Past Medical History:  Past Medical History  Diagnosis Date  . Allergy     allergic Rhintis  . Hyperlipidemia   . Hypertension   . Osteoporosis   . PAF (paroxysmal atrial fibrillation)     a. dates back > 5 yrs, prev on coumadin->d/c 2/2 GIB;  b.  12/2011 Echo:  EF 60-65%, nl wall motion Triv MR/TR.  [name] Carotid stenosis     a. 09/2007: Carotid Doppler stable- stable bilateral stenosis  40-59% (from 06)  . Type II diabetes mellitus   . Tobacco abuse     a. ongoing - 50+ pack yr hx.  . Dementia     Dementia in Hospital  //Does O.K. at home.  . Diverticulosis   . GI bleeding     a. when on coumadin in 2006 - colonoscopy @ that time showed 2  large right colon avm's, multiple small bowel avm's, fresh blood  in prox small bowel.  . AVM (arteriovenous malformation) of colon     a. 2006 colonscopy   Past Surgical History:  Past Surgical History  Procedure Date  . Cardiac catheterization 1990's    clear  . Orif tibia & fibula fractures 05/2004  . Small bowel enteroscopy 03/2005    AVMS   HPI:  Ms. Nunley is an 78 yo with hx of PAF, HTN, COPD ( ongoing  cigarette smoking) admitted with a COPD exacerbation and rapid  atrial fibrillation. Diagnosed with probably bilateral PNA.  Patient reports that she has had PNa frequently since esophagus  was dilated a few years ago.      Assessment / Plan / Recommendation Clinical Impression  Clinical impression: Patient presents with a functional  oropharyngeal swallow which is largely [facility] with the exception of  intermittent trace pharyngeal residuals noted post swallow (? due  to generalized weakness) which clear with spontaneous dry  swallows. No aspiration or penetration observed. Recommend  continuation of a regular diet, thin liquids with general safe  swallowing and reflux precautions given h/o GERD, esophageal  stenosis. Per RN, son concerned about patient's general decline  for solid foods prior to admission. Although no significant  backflow of bolus into the pharynx noted during today's exam, f/u  esophageal w/u may be beneficial if recurrent stricture or  additional esophageal dysfunction remains a concern. SLP will f/u  briefly at bedside for diet tolerance and education.     Treatment Recommendation  Therapy as outlined in treatment plan below  Diet Recommendation Regular;Thin liquid   Liquid Administration via: Cup;Straw Medication Administration: Whole meds with liquid Supervision: Patient able to self feed;Intermittent supervision  to cue for compensatory strategies Compensations:  Slow rate;Small sips/bites Postural Changes and/or Swallow Maneuvers: Seated upright 90  degrees;Upright 30-60 min after meal    Other  Recommendations Oral Care Recommendations: Oral care BID   Follow Up Recommendations  None    Frequency and Duration min 2x/week  2 weeks   Pertinent Vitals/Pain n/a    SLP Swallow Goals Patient will utilize recommended strategies during swallow to  increase swallowing safety with: Supervision/safety Swallow Study Goal #2 - Progress: Not met   General HPI: Ms. Reesor is an 78 yo with hx of PAF, HTN, COPD (  ongoing cigarette smoking) admitted with a COPD exacerbation and  rapid atrial fibrillation. Diagnosed with probably bilateral PNA.  Patient reports that she has had PNa frequently since esophagus  was dilated a few years ago.  Type of Study: Modified Barium Swallowing Study Reason for Referral: Objectively evaluate swallowing function Previous Swallow Assessment: MBS 12/13/08-dysphagia 3, nectar  thick liquids. 01/10/09-advanced to regular diet, thin  liquids-flash penetration Diet Prior to this Study: Regular;Thin liquids Temperature Spikes Noted: No Respiratory Status: Supplemental O2 delivered via (comment) (5 L  nasal cannula) History of Recent Intubation: No Behavior/Cognition: Alert;Cooperative;Pleasant mood Oral Cavity - Dentition: Missing dentition Oral Motor / Sensory Function: Within functional limits Self-Feeding Abilities: Able to feed self Patient Positioning: Upright in chair Baseline Vocal Quality: Hoarse Volitional Cough: Strong Volitional Swallow: Able to elicit Anatomy: Within functional limits Pharyngeal Secretions: Not observed secondary MBS    Reason for Referral Objectively evaluate swallowing function   Oral Phase Oral Preparation/Oral Phase Oral Phase: WFL   Pharyngeal Phase Pharyngeal Phase Pharyngeal Phase: Impaired Pharyngeal - Thin Pharyngeal - Thin Cup: Delayed swallow initiation;Premature  spillage to valleculae;Pharyngeal residue -  valleculae;Pharyngeal  residue - pyriform sinuses (trace residuals clear with  spontaneous dry swallow) Pharyngeal - Thin Straw: Delayed swallow initiation;Premature  spillage to valleculae;Pharyngeal residue - valleculae;Pharyngeal  residue - pyriform sinuses (trace residuals clear with  spontaneous dry swallow) Pharyngeal - Solids Pharyngeal - Puree: Delayed swallow initiation;Premature spillage  to valleculae Pharyngeal - Regular: Delayed swallow initiation;Premature  spillage to valleculae Pharyngeal - Pill: Within functional limits  Cervical Esophageal Phase    GO   Ferdinand Lango MA, CCC-SLP [phone]  Cervical Esophageal Phase Cervical Esophageal Phase: [facility]       McCoy Leah [name] 04/22/2012, 2:50 PM       1. Atrial fibrillation with rapid ventricular response   2. Congestive heart failure   3. Acute diastolic CHF (congestive heart failure)   4. Atrial fibrillation   5. COPD (chronic obstructive pulmonary disease)   6. Dementia   7. Pure hypercholesterolemia   8. Tobacco use disorder   9. Type II or unspecified type diabetes mellitus without mention of complication, not stated as uncontrolled   10. Unspecified essential hypertension       MDM  Cardiology is coming to see the patient in emergency department        Nelia Shi, MD 04/23/12 [phone]

## 2012-04-21 NOTE — ED Notes (Addendum)
Pt presents to department via [facility] for evaluation of SOB and afib with RVR. Symptoms ongoing x1 days. Rhonchi heard all lung fields. Speaking short sentences upon arrival. 20g R wrist. She is conscious alert and oriented x4. Denies pain. CBG 271.

## 2012-04-21 NOTE — Progress Notes (Signed)
Spoke with Lauren Shipper NP about SLP order for dysphagia. He stated to not withhold food at present, he requested SLP due to pt son stating pt difficulty chewing lately accompanied with weight loss. Pt states weight loss is due to loosing dog approx 1.78yrs ago. Pt A&O x4, denies needs. Will continue to monitor. [place], [place] M

## 2012-04-21 NOTE — H&P (Signed)
Patient ID: [name] MRN: [id number], DOB/AGE: [birth year]   Admit date: 04/21/2012   Primary Physician: Roxy Manns, MD Primary Cardiologist: Shela Commons. Myron Lona, MD  Pt. Profile:  78 y/o female with h/o PAF who presents with a 4 day h/o cough and dyspnea and is found to be in afib.  Problem List  Past Medical History  Diagnosis Date  . Allergy     allergic Rhintis  . Hyperlipidemia   . Hypertension   . Osteoporosis   . PAF (paroxysmal atrial fibrillation)     a. dates back > 5 yrs, prev on coumadin->d/c 2/2 GIB;  b. 12/2011 Echo:  EF 60-65%, nl wall motion Triv MR/TR.  [name] Carotid stenosis     a. 09/2007: Carotid Doppler stable- stable bilateral stenosis 40-59% (from 06)  . Type II diabetes mellitus   . Tobacco abuse     a. ongoing - 50+ pack yr hx.  . Dementia     Dementia in Hospital //Does O.K. at home.  . Diverticulosis   . GI bleeding     a. when on coumadin in 2006 - colonoscopy @ that time showed 2 large right colon avm's, multiple small bowel avm's, fresh blood in prox small bowel.  . AVM (arteriovenous malformation) of colon     a. 2006 colonscopy    Past Surgical History  Procedure Date  . Cardiac catheterization 1990's    clear  . Orif tibia & fibula fractures 05/2004  . Small bowel enteroscopy 03/2005    AVMS     Allergies  Allergies  Allergen Reactions  . Azithromycin     REACTION: inc blood sugar  . Codeine Itching  . Coumadin (Warfarin) Other (See Comments)    Causes bleeding  . Oxybutynin Chloride Er     Constipation/ dizziness/ dry mouth    HPI  78 y/o female with h/o PAF, previously on coumadin however this was d/c'd in 2006 2/2 colonic AVM's.  Her last known episode of afib was following ankle surgery in 12/2008.  Pt lives @ home by herself in [place] and continues to smoke 1/2 ppd of cigarettes.  She was in her USOH until 4 days ago when she noted increasingly frequent productive cough with intermittent fevers and chills and DOE.  She  denies chest pain, pnd, orthopnea, n, v, dizziness, syncope, or edema.  Her appetite has dropped off some.  Today, she was visited by her son and upon seeing her condition, EMS was summoned.  She was taken to the [facility] ED where she was found to be in afib with RVR.  She has been placed on IV diltiazem @ 5mg /hr and rates are hovering in the 130's.  CXR showed patchy airspace opacities especially confluent at the left lung base but also in the left mid lung and right lung base, potentially from asymmetric edema or bilateral pneumonia.  We have been asked to eval.  She has been treated with lasix 60mg  IV and is currently sat'ing in the high 80's on nasal cannula.  I spoke with the pts son and he reports that she complained of a "chest cold" over the weekend.  He went to visit her this AM and he found the doors locked and she would not answer the door.  He was able to jimmy his way into the house and he found her on the floor in her hallway, 1/2 under a table.  She was alert and oriented and in no distress.  He was concerned that  she may have fallen and he called 911.  She reports that she did not fall but that she was simply lying on the floor b/c she feels that it is therapeutic for her back.  Home Medications  Prior to Admission medications   Medication Sig Start Date End Date Taking? Authorizing Provider  amLODipine (NORVASC) 10 MG tablet Take 1 tablet (10 mg total) by mouth daily. 08/05/11  Yes Judy Pimple, MD  aspirin 81 MG tablet Take 81 mg by mouth daily.     Yes Historical Provider, MD  calcium carbonate (OS-CAL) 600 MG TABS Take 600 mg by mouth 2 (two) times daily with a meal.    Yes Historical Provider, MD  Cholecalciferol (VITAMIN D3) 2000 UNITS capsule Take 2,000 Units by mouth daily.     Yes Historical Provider, MD  glyBURIDE (DIABETA) 5 MG tablet Take 1 tablet (5 mg total) by mouth daily with breakfast. 08/05/11  Yes Judy Pimple, MD  lisinopril (PRINIVIL,ZESTRIL) 5 MG tablet Take 1 tablet (5  mg total) by mouth daily. 08/05/11  Yes Judy Pimple, MD  Multiple Vitamin (MULTIVITAMIN) tablet Take 1 tablet by mouth daily.     Yes Historical Provider, MD  simvastatin (ZOCOR) 20 MG tablet Take 1 tablet (20 mg total) by mouth daily. 08/05/11  Yes Judy Pimple, MD  fluticasone (FLONASE) 50 MCG/ACT nasal spray 2 sprays by Nasal route daily. 11/21/10 11/21/11  Audrie Gallus Tower, MD  glucose blood (ONE TOUCH ULTRA TEST) test strip 1 each by Other route as needed. Use as instructed Check blood sugar daily and as needed     Historical Provider, MD   Family History  Family History  Problem Relation Age of Onset  . Stroke Mother     a. believes she died suddenly @ 72.  . Diabetes Mother   . Stroke Father     Died from cerebral hemorrhage @ 24  . Diabetes Father   . Cancer Sister     colon and stomach cancer  . Diabetes Brother   . Heart disease Brother   . Cancer Brother     died from lung cancer  . Heart disease Brother     MI  . Cancer Sister     ? liver//brain cancer  . Heart disease Sister   . Diabetes Sister     diabetes and glaucoma  . Cancer Brother     pancreatic cancer  . Diabetes Son    Social History  History   Social History  . Marital Status: Widowed    Spouse Name: N/A    Number of Children: N/A  . Years of Education: N/A   Occupational History  . Not on file.   Social History Main Topics  . Smoking status: Current Every Day Smoker -- 0.5 packs/day for 50 years    Types: Cigarettes  . Smokeless tobacco: Never Used  . Alcohol Use: No  . Drug Use: No  . Sexually Active: Not on file   Other Topics Concern  . Not on file   Social History Narrative   Lives in [place] by herself.    Review of Systems General:  +++ chills, fever.  No night sweats or weight changes.  Cardiovascular:  +++ sob/doe and cough.  No chest pain, edema, orthopnea, palpitations, paroxysmal nocturnal dyspnea. Dermatological: No rash, lesions/masses Respiratory: +++  cough/dyspnea. Urologic: No hematuria, dysuria Abdominal:   Per son, she has had issues with dysphagia in the past and has required  esoph dilation.  She has had difficult swallowing again recently.  No nausea, vomiting, diarrhea, bright red blood per rectum, melena, or hematemesis Neurologic:  No visual changes, wkns, changes in mental status. All other systems reviewed and are otherwise negative except as noted above.  Physical Exam  Blood pressure 114/68, pulse 125, temperature 98.9 F (37.2 C), temperature source Oral, resp. rate 33, SpO2 92.00%.  General: Pleasant, NAD Psych: Normal affect. Neuro: Alert and oriented to person.  Thought it was 1933 and that she was in [facility] Moves all extremities spontaneously.  Follows commands. HEENT: Normal  Neck: Supple without bruits.  JVD to jaw. Lungs:  Resp regular and unlabored.  Scatt rhonchi with insp/exp wheezing throughout.  Very coarse breath sounds. Heart: IR, IR no s3, s4, or murmurs. Abdomen: Soft, non-tender, non-distended, BS + x 4.  Extremities: No clubbing, cyanosis or edema. DP/PT/Radials 2+ and equal bilaterally.  Onychomycosis.  Labs  Troponin I 0.01  pBNP 6141.0  Lab Results  Component Value Date   WBC 6.5 04/21/2012   HGB 12.6 04/21/2012   HCT 37.0 04/21/2012   MCV 92.9 04/21/2012   PLT 181 04/21/2012    Lab 04/21/12 1347  NA 137  K 4.3  CL 99  CO2 --  BUN 49*  CREATININE 1.20*  CALCIUM --  PROT --  BILITOT --  ALKPHOS --  ALT --  AST --  GLUCOSE 278*   Radiology/Studies  Dg Chest Portable 1 View  04/21/2012  *RADIOLOGY REPORT*  Clinical Data: SOB, cough, congestion, diabetic, Hx of pna, smoker.  PORTABLE CHEST - 1 VIEW IMPRESSION:  1.  Patchy airspace opacities especially confluent at the left lung base but also in the left mid lung and right lung base, potentially from asymmetric edema or bilateral pneumonia. 2.  Small left pleural effusion is suspected. 3.  Mild cardiomegaly and  pulmonary venous hypertension.   Original Report Authenticated By: Dellia Cloud, M.D.    ECG  Afib, 135, no acute st/t changes.  ASSESSMENT AND PLAN  1.  PAF: 4 day h/o cough and dyspnea and now found to be in afib with rvr.  She denies palpitations.  Exam is consistent with CHF though there is also concern for possible pna.  Admit, diurese, rate control with IV dilt.  She is a poor long-term anticoagulation candidate 2/2 prior h/o AVM's and GIB along with baseline dementia.  We will place on heparin for now in case she requires cardioversion with short-term anticoagulation, though given unknown duration of afib, she would require TEE prior to DCCV.  2.  Acute Diastolic CHF:  In setting of #1.  EF was nl in 2010.  Repeat echo this admission.  Gentle diuresis.  Rate control.  Hold ACEI in setting of renal insuff.  3. DM:  Add SSI.  Hold metformin.  4.  HTN:  Currently stable on IV dilt.  Follow closely.  5.  Tob Abuse/COPD:  Cessation counseling.  Add inhalers.  Afebrile w/o wbc elevation.  6.  Dysphagia:  H/o esoph strictures req dilation in the past.  Will ask speech therapy to see for swallow eval.  May need to have GI see here.  Signed, Nicolasa Ducking, NP 04/21/2012, 3:40 PM  History and all data above reviewed.  Patient examined.  I agree with the findings as above.  She has a vague presentation but has pulmonary edema on CXR and is in afib RVR.  She has not been having fever and chills.  Her  WBC is not elevated. The patient exam reveals COR: Irregular  ,  Lungs: Left greater than right diffuse crackles  ,  Abd: Positive bowel sounds, no rebound no guarding, Ext no edema  .  All available labs, radiology testing, previous records reviewed. Agree with documented assessment and plan.  Dyspnea with obvious CHF.   I am not included to think that she has a pneumonia.  However, I will have a low threshold for atb if her exam and CXR does not improve.  For now she will be treated with  rate control for the atrial fib and heparin.  I will give her low dose IV diuretics given the increased BUN and creat.  I will check an echocardiogram. Rollene Rotunda  5:46 PM  04/21/2012

## 2012-04-21 NOTE — Progress Notes (Signed)
ANTICOAGULATION CONSULT NOTE - Initial Consult  Pharmacy Consult for Heparin Indication: atrial fibrillation  Allergies  Allergen Reactions  . Azithromycin     REACTION: inc blood sugar  . Codeine Itching  . Coumadin (Warfarin) Other (See Comments)    Causes bleeding  . Oxybutynin Chloride Er     Constipation/ dizziness/ dry mouth   Patient Measurements: Height: 5\' 6"  (167.6 cm) Weight: 150 lb 2.1 oz (68.1 kg) IBW/kg (Calculated) : 59.3   Vital Signs: Temp: 97.9 F (36.6 C) (10/22 1730) Temp src: Oral (10/22 1241) BP: 114/55 mmHg (10/22 1730) Pulse Rate: 124  (10/22 1730)  Labs:  [facility] 04/21/12 1347 04/21/12 1313  HGB 12.6 12.2  HCT 37.0 36.9  PLT -- 181  APTT -- --  LABPROT -- --  INR -- --  HEPARINUNFRC -- --  CREATININE 1.20* --  CKTOTAL -- --  CKMB -- --  TROPONINI -- --   Estimated Creatinine Clearance: 35 ml/min (by C-G formula based on Cr of 1.2).  Medical History: Past Medical History  Diagnosis Date  . Allergy     allergic Rhintis  . Hyperlipidemia   . Hypertension   . Osteoporosis   . PAF (paroxysmal atrial fibrillation)     a. dates back > 5 yrs, prev on coumadin->d/c 2/2 GIB;  b. 12/2011 Echo:  EF 60-65%, nl wall motion Triv MR/TR.  [name] Carotid stenosis     a. 09/2007: Carotid Doppler stable- stable bilateral stenosis 40-59% (from 06)  . Type II diabetes mellitus   . Tobacco abuse     a. ongoing - 50+ pack yr hx.  . Dementia     Dementia in Hospital //Does O.K. at home.  . Diverticulosis   . GI bleeding     a. when on coumadin in 2006 - colonoscopy @ that time showed 2 large right colon avm's, multiple small bowel avm's, fresh blood in prox small bowel.  . AVM (arteriovenous malformation) of colon     a. 2006 colonscopy   Medications:  Prescriptions prior to admission  Medication Sig Dispense Refill  . amLODipine (NORVASC) 10 MG tablet Take 1 tablet (10 mg total) by mouth daily.  30 tablet  11  . aspirin 81 MG tablet Take 81 mg by  mouth daily.        . calcium carbonate (OS-CAL) 600 MG TABS Take 600 mg by mouth 2 (two) times daily with a meal.       . Cholecalciferol (VITAMIN D3) 2000 UNITS capsule Take 2,000 Units by mouth daily.        [name] glyBURIDE (DIABETA) 5 MG tablet Take 1 tablet (5 mg total) by mouth daily with breakfast.  30 tablet  11  . lisinopril (PRINIVIL,ZESTRIL) 5 MG tablet Take 1 tablet (5 mg total) by mouth daily.  30 tablet  11  . Multiple Vitamin (MULTIVITAMIN) tablet Take 1 tablet by mouth daily.        . simvastatin (ZOCOR) 20 MG tablet Take 1 tablet (20 mg total) by mouth daily.  30 tablet  11  . fluticasone (FLONASE) 50 MCG/ACT nasal spray 2 sprays by Nasal route daily.  16 g  11  . glucose blood (ONE TOUCH ULTRA TEST) test strip 1 each by Other route as needed. Use as instructed Check blood sugar daily and as needed         Assessment: 78yo female admitted with h/o PAF with c/o cough and dyspnea.  She has received Warfarin in the past for  AF but this was discontinued due to GIB.  She is now on IV Diltiazem for rate control and the plan is to start IV heparin for stroke risk reduction.  Her CBC is stable as is her platelets at 181K.  She is without noted bleeding complications and is just above her IBW.  We will use her total body weight for dosing.    Goal of Therapy:  Heparin level 0.3-0.7 units/ml Monitor platelets by anticoagulation protocol: Yes   Plan:   Begin IV heparin with 2000 units IV bolus and then continuous IV Heparin infusion at 950 units/hr.  Will check a heparin level 6 hours after starting to ensure appropriate response.  Monitor CBC and s/s of bleeding.  Nadara Mustard, PharmD., MS Clinical Pharmacist Pager:  [phone] Thank you for allowing pharmacy to be part of this patients care team. 04/21/2012,6:11 PM

## 2012-04-22 ENCOUNTER — Inpatient Hospital Stay (HOSPITAL_COMMUNITY): Payer: PRIVATE HEALTH INSURANCE

## 2012-04-22 DIAGNOSIS — E119 Type 2 diabetes mellitus without complications: Secondary | ICD-10-CM

## 2012-04-22 DIAGNOSIS — F17200 Nicotine dependence, unspecified, uncomplicated: Secondary | ICD-10-CM

## 2012-04-22 DIAGNOSIS — J449 Chronic obstructive pulmonary disease, unspecified: Secondary | ICD-10-CM

## 2012-04-22 DIAGNOSIS — F0390 Unspecified dementia without behavioral disturbance: Secondary | ICD-10-CM

## 2012-04-22 DIAGNOSIS — I059 Rheumatic mitral valve disease, unspecified: Secondary | ICD-10-CM

## 2012-04-22 DIAGNOSIS — E7800 Pure hypercholesterolemia, unspecified: Secondary | ICD-10-CM

## 2012-04-22 LAB — CBC
HCT: 37.5 % (ref 36.0–46.0)
Hemoglobin: 12.4 g/dL (ref 12.0–15.0)
MCHC: 33.1 g/dL (ref 30.0–36.0)
MCV: 92.8 fL (ref 78.0–100.0)
RDW: 12.8 % (ref 11.5–15.5)

## 2012-04-22 LAB — GLUCOSE, CAPILLARY
Glucose-Capillary: 198 mg/dL — ABNORMAL HIGH (ref 70–99)
Glucose-Capillary: 305 mg/dL — ABNORMAL HIGH (ref 70–99)
Glucose-Capillary: 92 mg/dL (ref 70–99)

## 2012-04-22 LAB — BASIC METABOLIC PANEL
BUN: 42 mg/dL — ABNORMAL HIGH (ref 6–23)
Creatinine, Ser: 0.85 mg/dL (ref 0.50–1.10)
GFR calc Af Amer: 73 mL/min — ABNORMAL LOW (ref >90)
GFR calc non Af Amer: 63 mL/min — ABNORMAL LOW (ref >90)
Glucose, Bld: 221 mg/dL — ABNORMAL HIGH (ref 70–99)
Potassium: 3.9 mEq/L (ref 3.5–5.1)

## 2012-04-22 LAB — HEPARIN LEVEL (UNFRACTIONATED): Heparin Unfractionated: <0.1 IU/mL — ABNORMAL LOW (ref 0.30–0.70)

## 2012-04-22 LAB — LIPID PANEL
HDL: 51 mg/dL (ref >39)
LDL Cholesterol: 54 mg/dL (ref 0–99)
Total CHOL/HDL Ratio: 2.5 RATIO
Triglycerides: 121 mg/dL (ref <150)

## 2012-04-22 MED ORDER — INSULIN ASPART 100 UNIT/ML ~~LOC~~ SOLN: 0.0000 [IU] | Freq: Three times a day (TID) | SUBCUTANEOUS | Status: DC

## 2012-04-22 MED ORDER — LEVOFLOXACIN IN D5W 750 MG/150ML IV SOLN: 750.0000 mg | INTRAVENOUS | Status: DC

## 2012-04-22 MED ORDER — INSULIN ASPART 100 UNIT/ML ~~LOC~~ SOLN
0.0000 [IU] | Freq: Every day | SUBCUTANEOUS | Status: DC
  Administered 2012-04-25: 3 [IU] via SUBCUTANEOUS

## 2012-04-22 MED ORDER — LEVOFLOXACIN IN D5W 750 MG/150ML IV SOLN
750.0000 mg | INTRAVENOUS | Status: DC
  Administered 2012-04-23: 750 mg via INTRAVENOUS
  Filled 2012-04-22 (×2): qty 150

## 2012-04-22 MED ORDER — IPRATROPIUM BROMIDE 0.02 % IN SOLN
0.5000 mg | Freq: Four times a day (QID) | RESPIRATORY_TRACT | Status: DC
  Administered 2012-04-22 – 2012-05-05 (×48): 0.5 mg via RESPIRATORY_TRACT
  Filled 2012-04-22 (×49): qty 2.5

## 2012-04-22 MED ORDER — BIOTENE DRY MOUTH MT LIQD
15.0000 mL | Freq: Two times a day (BID) | OROMUCOSAL | Status: DC
  Administered 2012-04-22 – 2012-05-05 (×26): 15 mL via OROMUCOSAL

## 2012-04-22 MED ORDER — LEVALBUTEROL HCL 0.63 MG/3ML IN NEBU
0.6300 mg | INHALATION_SOLUTION | Freq: Four times a day (QID) | RESPIRATORY_TRACT | Status: DC
  Administered 2012-04-22 – 2012-04-23 (×2): 0.63 mg via RESPIRATORY_TRACT
  Filled 2012-04-22 (×8): qty 3

## 2012-04-22 MED ORDER — INSULIN ASPART 100 UNIT/ML ~~LOC~~ SOLN
3.0000 [IU] | Freq: Three times a day (TID) | SUBCUTANEOUS | Status: DC
  Administered 2012-04-22 – 2012-04-29 (×12): 3 [IU] via SUBCUTANEOUS
  Administered 2012-05-01: 19:00:00 via SUBCUTANEOUS
  Administered 2012-05-01 – 2012-05-05 (×9): 3 [IU] via SUBCUTANEOUS

## 2012-04-22 MED ORDER — HEPARIN BOLUS VIA INFUSION
2000.0000 [IU] | Freq: Once | INTRAVENOUS | Status: AC
  Administered 2012-04-22: 2000 [IU] via INTRAVENOUS
  Filled 2012-04-22: qty 2000

## 2012-04-22 MED ORDER — LEVALBUTEROL HCL 0.63 MG/3ML IN NEBU
0.6300 mg | INHALATION_SOLUTION | Freq: Three times a day (TID) | RESPIRATORY_TRACT | Status: DC
  Administered 2012-04-22: 0.63 mg via RESPIRATORY_TRACT
  Filled 2012-04-22 (×4): qty 3

## 2012-04-22 MED ORDER — IPRATROPIUM BROMIDE 0.02 % IN SOLN
0.5000 mg | Freq: Three times a day (TID) | RESPIRATORY_TRACT | Status: DC
  Administered 2012-04-22: 0.5 mg via RESPIRATORY_TRACT
  Filled 2012-04-22: qty 2.5

## 2012-04-22 MED ORDER — NICOTINE 21 MG/24HR TD PT24
21.0000 mg | MEDICATED_PATCH | Freq: Every day | TRANSDERMAL | Status: DC
  Administered 2012-04-22 – 2012-05-05 (×14): 21 mg via TRANSDERMAL
  Filled 2012-04-22 (×15): qty 1

## 2012-04-22 MED ORDER — BUDESONIDE 0.25 MG/2ML IN SUSP
0.2500 mg | Freq: Two times a day (BID) | RESPIRATORY_TRACT | Status: DC
  Administered 2012-04-22 – 2012-05-05 (×24): 0.25 mg via RESPIRATORY_TRACT
  Filled 2012-04-22 (×31): qty 2

## 2012-04-22 MED ORDER — INSULIN GLARGINE 100 UNIT/ML ~~LOC~~ SOLN
10.0000 [IU] | Freq: Every day | SUBCUTANEOUS | Status: DC
  Administered 2012-04-22 – 2012-04-25 (×3): 10 [IU] via SUBCUTANEOUS

## 2012-04-22 NOTE — Consult Note (Addendum)
Medical Consultation   [name]  ZOX:[id number]  DOB: 10-25-1929  DOA: 04/21/2012  PCP: Roxy Manns, MD  Requesting physician: Dr Elease Hashimoto  Reason for consultation: Medical co-management   History of Present Illness: Patient is a 78 year old female with multiple medical problems including diabetes, hypertension, hyperlipidemia, paroxysmal atrial fibrillation, osteoporosis, COPD, active smoker, dementia currently admitted for atrial fibrillation with RVR. Medicine consult was called for medical co-management. History was obtained from the patient who states that she was having cough with dyspnea and whitish sputum. She was found to have rapid atrial fibrillation, currently on Cardizem drip and heparin drip. Patient also lives alone, smokes 10-12 cigarettes a day, is on 3 L oxygen via nasal cannula for severe COPD. There was a question of possible aspiration and choking. Patient was evaluated by speech therapy and recommended modified barium swallow. Also, 2 view, chest x-ray is pending.   Allergies:   Allergies  Allergen Reactions  . Azithromycin     REACTION: inc blood sugar  . Codeine Itching  . Coumadin (Warfarin) Other (See Comments)    Causes bleeding  . Oxybutynin Chloride Er     Constipation/ dizziness/ dry mouth      Past Medical History  Diagnosis Date  . Allergy     allergic Rhintis  . Hyperlipidemia   . Hypertension   . Osteoporosis   . PAF (paroxysmal atrial fibrillation)     a. dates back > 5 yrs, prev on coumadin->d/c 2/2 GIB;  b. 12/2011 Echo:  EF 60-65%, nl wall motion Triv MR/TR.  [name] Carotid stenosis     a. 09/2007: Carotid Doppler stable- stable bilateral stenosis 40-59% (from 06)  . Type II diabetes mellitus   . Tobacco abuse     a. ongoing - 50+ pack yr hx.  . Dementia     Dementia in Hospital //Does O.K. at home.  . Diverticulosis   . GI bleeding     a. when on coumadin in 2006 - colonoscopy @ that time showed 2 large right colon avm's, multiple  small bowel avm's, fresh blood in prox small bowel.  . AVM (arteriovenous malformation) of colon     a. 2006 colonscopy    Past Surgical History  Procedure Date  . Cardiac catheterization 1990's    clear  . Orif tibia & fibula fractures 05/2004  . Small bowel enteroscopy 03/2005    AVMS    Social History:  reports that she has been smoking Cigarettes.  She has a 25 pack-year smoking history. Patient states that she smokes 10-12 cigarettes a day. She reports that she does not drink alcohol or use illicit drugs. She currently lives at home alone.  Family History  Problem Relation Age of Onset  . Stroke Mother     a. believes she died suddenly @ 58.  . Diabetes Mother   . Stroke Father     Died from cerebral hemorrhage @ 62  . Diabetes Father   . Cancer Sister     colon and stomach cancer  . Diabetes Brother   . Heart disease Brother   . Cancer Brother     died from lung cancer  . Heart disease Brother     MI  . Cancer Sister     ? liver//brain cancer  . Heart disease Sister   . Diabetes Sister     diabetes and glaucoma  . Cancer Brother     pancreatic cancer  . Diabetes Son     Review of  Systems:  Review of Systems:  Constitutional: Denies fever, chills, diaphoresis, appetite change and fatigue.  HEENT: Denies photophobia, eye pain, redness, hearing loss, ear pain, congestion, sore throat, rhinorrhea, sneezing, mouth sores, neck pain, neck stiffness and tinnitus.  + trouble swallowing (in the throat) Respiratory:  please see history of present illness  Cardiovascular: See history of present illness Gastrointestinal: Denies nausea, vomiting, abdominal pain, diarrhea, constipation, blood in stool and abdominal distention.  Genitourinary: Denies dysuria, urgency, frequency, hematuria, flank pain and difficulty urinating.  Musculoskeletal: Denies myalgias, back pain, joint swelling, arthralgias and gait problem.  Skin: Denies pallor, rash and wound.  Neurological:  Denies dizziness, seizures, syncope, weakness, light-headedness, numbness and headaches.  Hematological: Denies adenopathy. Easy bruising, personal or family bleeding history  Psychiatric/Behavioral: Denies suicidal ideation, mood changes, confusion, nervousness, sleep disturbance and agitation   Physical Exam: Blood pressure 103/50, pulse 78, temperature 98 F (36.7 C), temperature source Oral, resp. rate 31, height 5\' 6"  (1.676 m), weight 67.9 kg (149 lb 11.1 oz), SpO2 94.00%.  General: Alert and awake, oriented x3, not in any acute distress. HEENT: normocephalic, atraumatic, anicteric sclera, pupils reactive to light and accommodation, EOMI, oropharynx clear CVS: Irregularly irregular Chest: Bibasilar crackles with rhonchi bilaterally Abdomen: soft nontender, nondistended, normal bowel sounds, no organomegaly Extremities: no cyanosis, clubbing or edema noted bilaterally Neuro: Cranial nerves II-XII intact, no focal neurological deficits Psych: alert and oriented, stable mood and affect Skin: no rashes or lesions  Labs on Admission:  Basic Metabolic Panel:  Lab 04/22/12 1610 04/21/12 1347  NA 139 137  K 3.9 4.3  CL 95* 99  CO2 35* --  GLUCOSE 221* 278*  BUN 42* 49*  CREATININE 0.85 1.20*  CALCIUM 9.3 --  MG -- --  PHOS -- --   CBC:  Lab 04/22/12 0440 04/21/12 1347 04/21/12 1313  WBC 6.0 -- 6.5  NEUTROABS -- -- 4.8  HGB 12.4 12.6 --  HCT 37.5 37.0 --  MCV 92.8 -- 92.9  PLT 202 -- 181   Cardiac Enzymes:  Lab 04/22/12 0440 04/22/12 0019 04/21/12 1819  CKTOTAL -- -- --  CKMB -- -- --  CKMBINDEX -- -- --  TROPONINI <0.30 <0.30 <0.30   BNP: No components found with this basename: POCBNP:2 CBG:  Lab 04/22/12 0737 04/21/12 2139 04/21/12 1729  GLUCAP 198* 307* 242*    Inpatient Medications:   Scheduled Meds:   . antiseptic oral rinse  15 mL Mouth Rinse BID  . aspirin  81 mg Oral Daily  . atorvastatin  10 mg Oral q1800  . calcium carbonate  1 tablet Oral BID  WC  . cholecalciferol  2,000 Units Oral Daily  . diltiazem (CARDIZEM) infusion  5-15 mg/hr Intravenous Once  . furosemide  40 mg Intravenous BID  . furosemide  60 mg Intravenous Once  . glyBURIDE  5 mg Oral Q breakfast  . heparin  2,000 Units Intravenous Once  . heparin  2,000 Units Intravenous Once  . influenza  inactive virus vaccine  0.5 mL Intramuscular Tomorrow-1000  . insulin aspart  0-15 Units Subcutaneous TID WC  . ipratropium  0.5 mg Nebulization TID  . levalbuterol  0.63 mg Nebulization TID  . multivitamin with minerals  1 tablet Oral Daily  . pneumococcal 23 valent vaccine  0.5 mL Intramuscular Tomorrow-1000  . DISCONTD: calcium carbonate  600 mg Oral BID WC  . DISCONTD: ipratropium  0.5 mg Nebulization QID  . DISCONTD: simvastatin  20 mg Oral Daily   Continuous Infusions:   .  diltiazem (CARDIZEM) infusion 15 mg/hr (04/22/12 0549)  . heparin 1,200 Units/hr (04/22/12 0827)     Radiological Exams on Admission: Dg Chest Portable 1 View  04/21/2012  *RADIOLOGY REPORT*  Clinical Data: SOB, cough, congestion, diabetic, Hx of pna, smoker.  PORTABLE CHEST - 1 VIEW  Comparison: 12/16/2008  Findings: The patient is rotated to the left on today's exam, resulting in reduced diagnostic sensitivity and specificity. Patchy airspace opacities noted at the left lung base and in the left mid lung, and to a lesser extent of the right medial lung base.  Indistinct pulmonary vasculature noted.  Atherosclerotic calcification of the aortic arch is observed.  The component of the retrocardiac density and associated lucency could conceivably reflect a hiatal hernia although hiatal hernia was not demonstrated on the prior chest CT of 12/08/2008.  Mild cardiomegaly suspected.  IMPRESSION:  1.  Patchy airspace opacities especially confluent at the left lung base but also in the left mid lung and right lung base, potentially from asymmetric edema or bilateral pneumonia. 2.  Small left pleural effusion is  suspected. 3.  Mild cardiomegaly and pulmonary venous hypertension.   Original Report Authenticated By: Dellia Cloud, M.D.     Impression/Recommendations Active Problems:  COPD (chronic obstructive pulmonary disease) with exacerbation with active nicotine abuse - Placed on scheduled Xopenex and Atrovent qid, pulmicort Nebs, cont O2. - Holding off on oral steroids for now,will reassess in AM - I am awaiting 2 view Chest Xray to assess if she would need antibiotics, PNA vs asymmetric pulmonary edema on portable CXR. She does not have fever, leukocytosis at this time but she has a history of MDR acinetobacter PNA in 2010 and ?aspiration, threshold low.     Atrial Fibrillation with RVR - Per cardiology   Acute diastolic CHF (congestive heart failure): per cardiology   HYPERCHOLESTEROLEMIA: cont lipitor   TOBACCO USE - counseled strongly against nicotine use, placed on Nicoderm patch daily   HYPERTENSION: stable   DIABETES MELLITUS, TYPE II: uncontrolled, blood sugar in 300s - obtain hemoglobin A1c, placed on qhs coverage along with SSI, 3units novolog TID AC, lantus 10 units qhs.  - Will assess 24hours CBG's for any further changes  - hold oral hypoglycemics while inpatient - I'm holding off on oral steroids due to uncontrolled blood sugars  Dysphagia:  - Awaiting modified Ba swallow today, if she has esophageal stricturing, will likely need GI consult     Dementia - I am concerned if she is able to care for herself at home. Agree with PT eval and social work consult for possible placement.   I will followup on CXR today and in the morning. Please contact me if I can be of assistance in the meanwhile. Thank you for this consultation.  Time Spent   Jessenya Berdan M.D. [facility] 04/22/2012, 10:54 AM   Addendum:  CXR reviewed, multifocal PNA vs asymmetric pulm edema. I will place her levofloxacin 750mg  IV daily, monitor QTc. Will repeat CXR after diuresis  in 2-3 days to assess improvement.    Launa Goedken M.D. [facility] 04/22/2012, 4:22 PM  Pager: [phone]

## 2012-04-22 NOTE — Progress Notes (Signed)
04/21/12 2230 04/22/12 0200 04/22/12 0500  Unmeasured Output  Urine Occurrence 1  (large amount in bed) 1  (medium amount in bed) 1  (large amount in bed)   Pt wears briefs at home, unable to notify staff to use bedpan due to sleeping.

## 2012-04-22 NOTE — Progress Notes (Signed)
ANTICOAGULATION CONSULT NOTE   Pharmacy Consult for Heparin Indication: atrial fibrillation  Allergies  Allergen Reactions  . Azithromycin     REACTION: inc blood sugar  . Codeine Itching  . Coumadin (Warfarin) Other (See Comments)    Causes bleeding  . Oxybutynin Chloride Er     Constipation/ dizziness/ dry mouth   Patient Measurements: Height: 5\' 6"  (167.6 cm) Weight: 149 lb 11.1 oz (67.9 kg) IBW/kg (Calculated) : 59.3   Vital Signs: Temp: 98.2 F (36.8 C) (10/23 1200) Temp src: Oral (10/23 1600) BP: 116/59 mmHg (10/23 1600) Pulse Rate: 82  (10/23 1600)  Labs:  Basename 04/22/12 1621 04/22/12 0440 04/22/12 0019 04/21/12 1819 04/21/12 1347 04/21/12 1313  HGB -- 12.4 -- -- 12.6 --  HCT -- 37.5 -- -- 37.0 36.9  PLT -- 202 -- -- -- 181  APTT -- -- -- -- -- --  LABPROT -- -- -- -- -- --  INR -- -- -- -- -- --  HEPARINUNFRC 0.22* <0.10* -- -- -- --  CREATININE -- 0.85 -- -- 1.20* --  CKTOTAL -- -- -- -- -- --  CKMB -- -- -- -- -- --  TROPONINI -- <0.30 <0.30 <0.30 -- --   Estimated Creatinine Clearance: 49.4 ml/min (by C-G formula based on Cr of 0.85).  Assessment: 78 yo female with Afib for Heparin.  Heparin level 0.22 which is below goal.   Goal of Therapy:  Heparin level 0.3-0.7 units/ml Monitor platelets by anticoagulation protocol: Yes   Plan:  Increase heparin to 1450 units/hr Check heparin level in 8 hours.  Celedonio Miyamoto, PharmD, BCPS Clinical Pharmacist Pager [phone]   04/22/2012,5:40 PM

## 2012-04-22 NOTE — Procedures (Signed)
Objective Swallowing Evaluation: Modified Barium Swallowing Study  Patient Details  Name: [name] MRN: [id number] Date of Birth: 1929-11-06  Today's Date: 04/22/2012 Time: 1420-1440 SLP Time Calculation (min): 20 min  Past Medical History:  Past Medical History  Diagnosis Date  . Allergy     allergic Rhintis  . Hyperlipidemia   . Hypertension   . Osteoporosis   . PAF (paroxysmal atrial fibrillation)     a. dates back > 5 yrs, prev on coumadin->d/c 2/2 GIB;  b. 12/2011 Echo:  EF 60-65%, nl wall motion Triv MR/TR.  [name] Carotid stenosis     a. 09/2007: Carotid Doppler stable- stable bilateral stenosis 40-59% (from 06)  . Type II diabetes mellitus   . Tobacco abuse     a. ongoing - 50+ pack yr hx.  . Dementia     Dementia in Hospital //Does O.K. at home.  . Diverticulosis   . GI bleeding     a. when on coumadin in 2006 - colonoscopy @ that time showed 2 large right colon avm's, multiple small bowel avm's, fresh blood in prox small bowel.  . AVM (arteriovenous malformation) of colon     a. 2006 colonscopy   Past Surgical History:  Past Surgical History  Procedure Date  . Cardiac catheterization 1990's    clear  . Orif tibia & fibula fractures 05/2004  . Small bowel enteroscopy 03/2005    AVMS   HPI:  [name] is an 78 yo with hx of PAF, HTN, COPD ( ongoing cigarette smoking) admitted with a COPD exacerbation and rapid atrial fibrillation. Diagnosed with probably bilateral PNA. Patient reports that she has had PNa frequently since esophagus was dilated a few years ago.      Assessment / Plan / Recommendation Clinical Impression  Clinical impression: Patient presents with a functional oropharyngeal swallow which is largely [facility] with the exception of intermittent trace pharyngeal residuals noted post swallow (? due to generalized weakness) which clear with spontaneous dry swallows. No aspiration or penetration observed. Recommend continuation of a regular diet, thin liquids  with general safe swallowing and reflux precautions given h/o GERD, esophageal stenosis. Per RN, son concerned about patient's general decline for solid foods prior to admission. Although no significant backflow of bolus into the pharynx noted during today's exam, f/u esophageal w/u may be beneficial if recurrent stricture or additional esophageal dysfunction remains a concern. SLP will f/u briefly at bedside for diet tolerance and education.     Treatment Recommendation  Therapy as outlined in treatment plan below    Diet Recommendation Regular;Thin liquid   Liquid Administration via: Cup;Straw Medication Administration: Whole meds with liquid Supervision: Patient able to self feed;Intermittent supervision to cue for compensatory strategies Compensations: Slow rate;Small sips/bites Postural Changes and/or Swallow Maneuvers: Seated upright 90 degrees;Upright 30-60 min after meal    Other  Recommendations Oral Care Recommendations: Oral care BID   Follow Up Recommendations  None    Frequency and Duration min 2x/week  2 weeks   Pertinent Vitals/Pain n/a    SLP Swallow Goals Patient will utilize recommended strategies during swallow to increase swallowing safety with: Supervision/safety Swallow Study Goal #2 - Progress: Not met   General HPI: [name] is an 78 yo with hx of PAF, HTN, COPD ( ongoing cigarette smoking) admitted with a COPD exacerbation and rapid atrial fibrillation. Diagnosed with probably bilateral PNA. Patient reports that she has had PNa frequently since esophagus was dilated a few years ago.  Type of  Study: Modified Barium Swallowing Study Reason for Referral: Objectively evaluate swallowing function Previous Swallow Assessment: MBS 12/13/08-dysphagia 3, nectar thick liquids. 01/10/09-advanced to regular diet, thin liquids-flash penetration Diet Prior to this Study: Regular;Thin liquids Temperature Spikes Noted: No Respiratory Status: Supplemental O2 delivered via  (comment) (5 L nasal cannula) History of Recent Intubation: No Behavior/Cognition: Alert;Cooperative;Pleasant mood Oral Cavity - Dentition: Missing dentition Oral Motor / Sensory Function: Within functional limits Self-Feeding Abilities: Able to feed self Patient Positioning: Upright in chair Baseline Vocal Quality: Hoarse Volitional Cough: Strong Volitional Swallow: Able to elicit Anatomy: Within functional limits Pharyngeal Secretions: Not observed secondary MBS    Reason for Referral Objectively evaluate swallowing function   Oral Phase Oral Preparation/Oral Phase Oral Phase: WFL   Pharyngeal Phase Pharyngeal Phase Pharyngeal Phase: Impaired Pharyngeal - Thin Pharyngeal - Thin Cup: Delayed swallow initiation;Premature spillage to valleculae;Pharyngeal residue - valleculae;Pharyngeal residue - pyriform sinuses (trace residuals clear with spontaneous dry swallow) Pharyngeal - Thin Straw: Delayed swallow initiation;Premature spillage to valleculae;Pharyngeal residue - valleculae;Pharyngeal residue - pyriform sinuses (trace residuals clear with spontaneous dry swallow) Pharyngeal - Solids Pharyngeal - Puree: Delayed swallow initiation;Premature spillage to valleculae Pharyngeal - Regular: Delayed swallow initiation;Premature spillage to valleculae Pharyngeal - Pill: Within functional limits  Cervical Esophageal Phase    GO   Ferdinand Lango MA, CCC-SLP [phone]  Cervical Esophageal Phase Cervical Esophageal Phase: [facility]       [name] 04/22/2012, 2:50 PM

## 2012-04-22 NOTE — Progress Notes (Signed)
  Echocardiogram 2D Echocardiogram has been performed.  Tramond Slinker 04/22/2012, 12:52 PM

## 2012-04-22 NOTE — Progress Notes (Signed)
ANTICOAGULATION CONSULT NOTE   Pharmacy Consult for Heparin Indication: atrial fibrillation  Allergies  Allergen Reactions  . Azithromycin     REACTION: inc blood sugar  . Codeine Itching  . Coumadin (Warfarin) Other (See Comments)    Causes bleeding  . Oxybutynin Chloride Er     Constipation/ dizziness/ dry mouth   Patient Measurements: Height: 5\' 6"  (167.6 cm) Weight: 149 lb 11.1 oz (67.9 kg) IBW/kg (Calculated) : 59.3   Vital Signs: Temp: 97.9 F (36.6 C) (10/23 0354) Temp src: Oral (10/23 0354) BP: 100/80 mmHg (10/23 0400) Pulse Rate: 73  (10/23 0400)  Labs:  Basename 04/22/12 0440 04/22/12 0019 04/21/12 1819 04/21/12 1347 04/21/12 1313  HGB 12.4 -- -- 12.6 --  HCT 37.5 -- -- 37.0 36.9  PLT 202 -- -- -- 181  APTT -- -- -- -- --  LABPROT -- -- -- -- --  INR -- -- -- -- --  HEPARINUNFRC <0.10* -- -- -- --  CREATININE 0.85 -- -- 1.20* --  CKTOTAL -- -- -- -- --  CKMB -- -- -- -- --  TROPONINI <0.30 <0.30 <0.30 -- --   Estimated Creatinine Clearance: 49.4 ml/min (by C-G formula based on Cr of 0.85).  Assessment: 78 yo female with Afib for Heparin  Goal of Therapy:  Heparin level 0.3-0.7 units/ml Monitor platelets by anticoagulation protocol: Yes   Plan:  Heparin 2000 units IV bolus, then increase heparin 1200 units/hr Check heparin level in 8 hours.  Geannie Risen, PharmD, BCPS  04/22/2012,6:53 AM

## 2012-04-22 NOTE — Progress Notes (Signed)
PROGRESS NOTE  Subjective:   [name] is an 78 yo with hx of PAF, HTN, COPD ( ongoing cigarette smoking) admitted with a COPD exacerbation and rapid atrial fibrillation.  I/O not accurate - incontinent in bed.   Objective:    Vital Signs:   Temp:  [97.9 F (36.6 C)-98.9 F (37.2 C)] 97.9 F (36.6 C) (10/23 0354) Pulse Rate:  [73-148] 73  (10/23 0400) Resp:  [18-33] 29  (10/23 0400) BP: (100-122)/(42-80) 100/80 mmHg (10/23 0400) SpO2:  [72 %-96 %] 92 % (10/23 0400) Weight:  [149 lb 11.1 oz (67.9 kg)-150 lb 2.1 oz (68.1 kg)] 149 lb 11.1 oz (67.9 kg) (10/23 0558)  Last BM Date:  (Pt does not know)   24-hour weight change: Weight change:   Weight trends: Filed Weights   04/21/12 1728 04/22/12 0558  Weight: 150 lb 2.1 oz (68.1 kg) 149 lb 11.1 oz (67.9 kg)    Intake/Output:  10/22 0701 - 10/23 0700 In: 269.3 [I.V.:265.3; IV Piggyback:4] Out: 250 [Urine:250]     Physical Exam: BP 100/80  Pulse 73  Temp 97.9 F (36.6 C) (Oral)  Resp 29  Ht 5\' 6"  (1.676 m)  Wt 149 lb 11.1 oz (67.9 kg)  BMI 24.16 kg/m2  SpO2 92%  General: Vital signs reviewed and noted. Uncooperative ! Would not speak to me until I shook her several times . Then she replied " I heard you"  Head: Normocephalic, atraumatic.  Eyes: conjunctivae/corneas clear.  EOM's intact.   Throat:   Neck: Supple. Normal carotids. No JVD  Lungs:  Basilar rales, rhonchi and wheezes - especially with cough.  Heart: Irregularly irregular, soft systolic murmur  Abdomen:  Soft, non-tender, non-distended with normoactive bowel sounds. No hepatomegaly. No rebound/guarding. No abdominal masses.  Extremities: Distal pedal pulses are 2+ .  No edema.    Neurologic: Nonfocal.  Would not really cooperate with me this am. No evidence of CVA.   Psych: ? , difficult to assess.  Would not cooperate with me.    Labs: BMET:  Basename 04/22/12 0440 04/21/12 1347  NA 139 137  K 3.9 4.3  CL 95* 99  CO2 35* --  GLUCOSE 221*  278*  BUN 42* 49*  CREATININE 0.85 1.20*  CALCIUM 9.3 --  MG -- --  PHOS -- --    Liver function tests: No results found for this basename: AST:2,ALT:2,ALKPHOS:2,BILITOT:2,PROT:2,ALBUMIN:2 in the last 72 hours No results found for this basename: LIPASE:2,AMYLASE:2 in the last 72 hours  CBC:  Basename 04/22/12 0440 04/21/12 1347 04/21/12 1313  WBC 6.0 -- 6.5  NEUTROABS -- -- 4.8  HGB 12.4 12.6 --  HCT 37.5 37.0 --  MCV 92.8 -- 92.9  PLT 202 -- 181    Cardiac Enzymes:  Basename 04/22/12 0440 04/22/12 0019 04/21/12 1819  CKTOTAL -- -- --  CKMB -- -- --  TROPONINI <0.30 <0.30 <0.30    Coagulation Studies: No results found for this basename: LABPROT:5,INR:5 in the last 72 hours  Other: No components found with this basename: POCBNP:3 No results found for this basename: DDIMER in the last 72 hours No results found for this basename: HGBA1C in the last 72 hours  Basename 04/22/12 0440  CHOL 129  HDL 51  LDLCALC 54  TRIG 121  CHOLHDL 2.5   No results found for this basename: TSH,T4TOTAL,FREET3,T3FREE,THYROIDAB in the last 72 hours No results found for this basename: VITAMINB12,FOLATE,FERRITIN,TIBC,IRON,RETICCTPCT in the last 72 hours    Tele:  Atrial fib with  ventricular rate of 80-90  Medications:    Infusions:    . diltiazem (CARDIZEM) infusion 15 mg/hr (04/22/12 0549)  . heparin 950 Units/hr (04/22/12 0400)    Scheduled Medications:    . antiseptic oral rinse  15 mL Mouth Rinse BID  . aspirin  81 mg Oral Daily  . atorvastatin  10 mg Oral q1800  . calcium carbonate  1 tablet Oral BID WC  . cholecalciferol  2,000 Units Oral Daily  . diltiazem (CARDIZEM) infusion  5-15 mg/hr Intravenous Once  . furosemide  40 mg Intravenous BID  . furosemide  60 mg Intravenous Once  . glyBURIDE  5 mg Oral Q breakfast  . heparin  2,000 Units Intravenous Once  . heparin  2,000 Units Intravenous Once  . influenza  inactive virus vaccine  0.5 mL Intramuscular  Tomorrow-1000  . insulin aspart  0-15 Units Subcutaneous TID WC  . ipratropium  0.5 mg Nebulization TID  . levalbuterol  0.63 mg Nebulization TID  . multivitamin with minerals  1 tablet Oral Daily  . pneumococcal 23 valent vaccine  0.5 mL Intramuscular Tomorrow-1000  . DISCONTD: calcium carbonate  600 mg Oral BID WC  . DISCONTD: ipratropium  0.5 mg Nebulization QID  . DISCONTD: simvastatin  20 mg Oral Daily    Assessment/ Plan:    HYPERCHOLESTEROLEMIA (11/25/2006) Continue atorvastatin  COPD with ongoing cigarette smoking:  She needs to stop smoking.  I suspect this COPD exacerbation is the reason for this most reason episode of atrial fibrillation.  I think her CXR is worrisome for pneumonia.  I would like a medicine consult to help [place] manage her COPD with possible pneumonia  Possible pneumonia:  See above.  Will get a [facility] consult.  HYPERTENSION (11/03/2007) Stable  Atrial fibrillation (11/03/2007)  her Atrial fib is likely due to her COPD exacerbation +/- pneumonia.  She is thought to be a poor candidate for long term coumadin due to GI bleed from AVMs.  DIABETES MELLITUS, TYPE II (11/03/2007) Will ask medicine to help manage  Dementia (04/21/2012) She was not cooperative with me this am.  She is incontinent in bed.  This will be a challenge during her admission.  Possible Acute diastolic CHF (congestive heart failure) (04/21/2012) Echo is pending.  She had normal LV systolic function at her last echo in 2010.  I suspect most of her current dyspnea is due to COPD exacerbation and not diastolic CHF.     Disposition: medicine consult.  To help [place] manage her COPD, possible pneumonia, diabetes, dementia.  I would not recommend cardioversion until her COPD is better controlled as she would likely go back into AF.  Rate is well controlled at present.  Length of Stay: 1  Vesta Mixer, Montez Hageman., MD, [facility] 04/22/2012, 7:32 AM Office [phone] Pager [phone]

## 2012-04-22 NOTE — Evaluation (Signed)
Clinical/Bedside Swallow Evaluation Patient Details  Name: [name] MRN: [id number] Date of Birth: October 26, 1929  Today's Date: 04/22/2012 Time: 4098-1191 SLP Time Calculation (min): 18 min  Past Medical History:  Past Medical History  Diagnosis Date  . Allergy     allergic Rhintis  . Hyperlipidemia   . Hypertension   . Osteoporosis   . PAF (paroxysmal atrial fibrillation)     a. dates back > 5 yrs, prev on coumadin->d/c 2/2 GIB;  b. 12/2011 Echo:  EF 60-65%, nl wall motion Triv MR/TR.  [name] Carotid stenosis     a. 09/2007: Carotid Doppler stable- stable bilateral stenosis 40-59% (from 06)  . Type II diabetes mellitus   . Tobacco abuse     a. ongoing - 50+ pack yr hx.  . Dementia     Dementia in Hospital //Does O.K. at home.  . Diverticulosis   . GI bleeding     a. when on coumadin in 2006 - colonoscopy @ that time showed 2 large right colon avm's, multiple small bowel avm's, fresh blood in prox small bowel.  . AVM (arteriovenous malformation) of colon     a. 2006 colonscopy   Past Surgical History:  Past Surgical History  Procedure Date  . Cardiac catheterization 1990's    clear  . Orif tibia & fibula fractures 05/2004  . Small bowel enteroscopy 03/2005    AVMS   HPI:  [name] is an 78 yo with hx of PAF, HTN, COPD ( ongoing cigarette smoking) admitted with a COPD exacerbation and rapid atrial fibrillation. Diagnosed with probably bilateral PNA. Patient reports that she has had PNa frequently since esophagus was dilated a few years ago.    Assessment / Plan / Recommendation Clinical Impression  Patient presents with subtle s/s of a mild oropharyngeal phase dysphagia with likely esophageal components based on presentation and h/o stenosis, c/o globus and regurgitation. Given history as well as acute and possibly recurrent PNA, will proceed with objective evaluation to r/o silent aspiration.     Aspiration Risk  Moderate    Diet Recommendation Regular;Thin liquid    Liquid Administration via: Cup;Straw Medication Administration: Whole meds with liquid Supervision: Patient able to self feed;Intermittent supervision to cue for compensatory strategies Compensations: Slow rate;Small sips/bites Postural Changes and/or Swallow Maneuvers: Seated upright 90 degrees;Upright 30-60 min after meal    Other  Recommendations Recommended Consults: MBS Oral Care Recommendations: Oral care BID   Follow Up Recommendations  Other (comment) (TBD)       Pertinent Vitals/Pain n/a      Swallow Study    General HPI: [name] is an 78 yo with hx of PAF, HTN, COPD ( ongoing cigarette smoking) admitted with a COPD exacerbation and rapid atrial fibrillation. Diagnosed with probably bilateral PNA. Patient reports that she has had PNa frequently since esophagus was dilated a few years ago.  Type of Study: Bedside swallow evaluation Previous Swallow Assessment: MBS 12/13/08-dysphagia 3, nectar thick liquids. 01/10/09-advanced to regular diet, thin liquids-flash penetration Diet Prior to this Study: Regular;Thin liquids Temperature Spikes Noted: No Respiratory Status: Supplemental O2 delivered via (comment) (nasal cannula) History of Recent Intubation: No Behavior/Cognition: Alert;Cooperative;Pleasant mood Oral Cavity - Dentition: Missing dentition (dentures at home) Self-Feeding Abilities: Able to feed self Patient Positioning: Upright in bed Baseline Vocal Quality: Hoarse (for 2 weeks per patient) Volitional Cough: Strong Volitional Swallow: Able to elicit    Oral/Motor/Sensory Function Overall Oral Motor/Sensory Function: Appears within functional limits for tasks assessed   Ice  Chips Ice chips: Not tested   Thin Liquid Thin Liquid: Impaired Presentation: Cup;Straw;Self Fed Pharyngeal  Phase Impairments: Multiple swallows (belching post swallow)    Nectar Thick Nectar Thick Liquid: Not tested   Honey Thick Honey Thick Liquid: Not tested   Puree Puree: Within  functional limits Presentation: Self Fed;Spoon   Solid   GO    Solid: Within functional limits Presentation: Self Fed      Dasiah Hooley MA, CCC-SLP [phone]  Tailer Volkert [name] 04/22/2012,10:22 AM

## 2012-04-22 NOTE — Progress Notes (Signed)
Brief Nutrition Note:  Pt answered "unsure" to "Have you recently lost weight without trying?" generating a MST (Malnutrition Screening Tool) score of 2. Pt states that she has maintained a good appetite. Does not weigh herself, but denies a change in the way her clothes fit.  Weight hx:  Wt Readings from Last 5 Encounters:  04/22/12 149 lb 11.1 oz (67.9 kg)  01/04/11 152 lb (68.947 kg)  12/18/10 154 lb 4 oz (69.967 kg)  11/21/10 153 lb 12 oz (69.741 kg)  08/21/10 152 lb 4 oz (69.06 kg)     Weight has been stable   Current diet is Carb mod Medium, no meals documented this admission.    Chart reviewed, no nutrition interventions warranted at this time, please consult as needed.   Clarene Duke RD, LDN Pager [phone] After Hours pager [phone]

## 2012-04-22 NOTE — Progress Notes (Signed)
Inpatient Diabetes Program Recommendations  AACE/ADA: New Consensus Statement on Inpatient Glycemic Control (2013)  Target Ranges:  Prepandial:   less than 140 mg/dL      Peak postprandial:   less than 180 mg/dL (1-2 hours)      Critically ill patients:  140 - 180 mg/dL   Consult received.  Diabetes Coordinator will follow-up tomorrow after A1C results.  Not enough information at this time.   Agree with new orders: addition of Lantus and Novolog meal coverage.    Inpatient Diabetes Program Recommendations HgbA1C: Pending  Thank you  [name] [facility] Inpatient Diabetes Coordinator [phone] (8am-[phone] after 5pm

## 2012-04-22 NOTE — Progress Notes (Signed)
Nutrition:  RD consulted for poor po intake. RD previously assessed pt who denies weight loss and weight hx confirms no significant weight loss. MBS completed for possible aspiration, SLP recommened regular diet with thin liquids.  Pt denied poor appetite.   Please see previous note for more details. No nutrition interventions at this time. Please re-consult if nutrition needs arise.   Clarene Duke RD, LDN Pager [phone] After Hours pager [phone]

## 2012-04-23 DIAGNOSIS — J9620 Acute and chronic respiratory failure, unspecified whether with hypoxia or hypercapnia: Secondary | ICD-10-CM | POA: Diagnosis present

## 2012-04-23 LAB — PROCALCITONIN: Procalcitonin: 0.15 ng/mL

## 2012-04-23 LAB — BASIC METABOLIC PANEL
BUN: 36 mg/dL — ABNORMAL HIGH (ref 6–23)
Calcium: 9.1 mg/dL (ref 8.4–10.5)
GFR calc non Af Amer: 64 mL/min — ABNORMAL LOW (ref >90)
Glucose, Bld: 229 mg/dL — ABNORMAL HIGH (ref 70–99)
Potassium: 3.6 mEq/L (ref 3.5–5.1)

## 2012-04-23 LAB — POCT I-STAT 3, ART BLOOD GAS (G3+)
Acid-Base Excess: 12 mmol/L — ABNORMAL HIGH (ref 0.0–2.0)
Bicarbonate: 39.4 mEq/L — ABNORMAL HIGH (ref 20.0–24.0)
Patient temperature: 37
TCO2: 41 mmol/L (ref 0–100)
pO2, Arterial: 88 mmHg (ref 80.0–100.0)

## 2012-04-23 LAB — GLUCOSE, CAPILLARY: Glucose-Capillary: 181 mg/dL — ABNORMAL HIGH (ref 70–99)

## 2012-04-23 LAB — HEPARIN LEVEL (UNFRACTIONATED): Heparin Unfractionated: 0.27 IU/mL — ABNORMAL LOW (ref 0.30–0.70)

## 2012-04-23 LAB — HEMOGLOBIN A1C: Hgb A1c MFr Bld: 7.6 % — ABNORMAL HIGH (ref <5.7)

## 2012-04-23 MED ORDER — PIPERACILLIN-TAZOBACTAM 3.375 G IVPB 30 MIN
3.3750 g | Freq: Three times a day (TID) | INTRAVENOUS | Status: DC
  Administered 2012-04-23 – 2012-04-24 (×3): 3.375 g via INTRAVENOUS
  Filled 2012-04-23 (×5): qty 50

## 2012-04-23 MED ORDER — VANCOMYCIN HCL IN DEXTROSE 1-5 GM/200ML-% IV SOLN
1000.0000 mg | INTRAVENOUS | Status: DC
  Administered 2012-04-23: 1000 mg via INTRAVENOUS
  Filled 2012-04-23 (×2): qty 200

## 2012-04-23 MED ORDER — LEVALBUTEROL HCL 0.63 MG/3ML IN NEBU
0.6300 mg | INHALATION_SOLUTION | RESPIRATORY_TRACT | Status: DC
  Administered 2012-04-23 – 2012-04-30 (×42): 0.63 mg via RESPIRATORY_TRACT
  Filled 2012-04-23 (×49): qty 3

## 2012-04-23 MED ORDER — HEPARIN (PORCINE) IN NACL 100-0.45 UNIT/ML-% IJ SOLN
1750.0000 [IU]/h | INTRAMUSCULAR | Status: DC
  Administered 2012-04-23 – 2012-04-27 (×5): 1750 [IU]/h via INTRAVENOUS
  Filled 2012-04-23 (×10): qty 250

## 2012-04-23 MED ORDER — FUROSEMIDE 10 MG/ML IJ SOLN
40.0000 mg | Freq: Once | INTRAMUSCULAR | Status: AC
  Administered 2012-04-23: 40 mg via INTRAVENOUS
  Filled 2012-04-23: qty 4

## 2012-04-23 MED ORDER — HEPARIN BOLUS VIA INFUSION
2000.0000 [IU] | Freq: Once | INTRAVENOUS | Status: AC
  Administered 2012-04-23: 2000 [IU] via INTRAVENOUS
  Filled 2012-04-23: qty 2000

## 2012-04-23 NOTE — Clinical Social Work Note (Signed)
Clinical Social Worker received referral for possible placement. CSW will await PT/OT recommendations regarding disposition needs.    Rozetta Nunnery, MSW, [employer] [phone]

## 2012-04-23 NOTE — Progress Notes (Signed)
PT Cancellation Note  Patient Details Name: ANYELA NAPIERKOWSKI MRN: [id number] DOB: 1930-02-27   Cancelled Treatment:    Reason Eval/Treat Not Completed: Medical issues which prohibited therapy   INGOLD,Ghazi Rumpf 04/23/2012, 11:34 AM [facility] [phone] (pager)

## 2012-04-23 NOTE — Progress Notes (Signed)
ANTICOAGULATION CONSULT NOTE   Pharmacy Consult for Heparin Indication: atrial fibrillation  Allergies  Allergen Reactions  . Azithromycin     REACTION: inc blood sugar  . Codeine Itching  . Coumadin (Warfarin) Other (See Comments)    Causes bleeding  . Oxybutynin Chloride Er     Constipation/ dizziness/ dry mouth   Patient Measurements: Height: 5\' 6"  (167.6 cm) Weight: 149 lb 11.1 oz (67.9 kg) IBW/kg (Calculated) : 59.3   Vital Signs: Temp: 99 F (37.2 C) (10/24 0006) Temp src: Oral (10/24 0006) BP: 106/51 mmHg (10/24 0330) Pulse Rate: 103  (10/24 0330)  Labs:  Basename 04/23/12 0134 04/22/12 1621 04/22/12 0440 04/22/12 0019 04/21/12 1819 04/21/12 1347 04/21/12 1313  HGB -- -- 12.4 -- -- 12.6 --  HCT -- -- 37.5 -- -- 37.0 36.9  PLT -- -- 202 -- -- -- 181  APTT -- -- -- -- -- -- --  LABPROT -- -- -- -- -- -- --  INR -- -- -- -- -- -- --  HEPARINUNFRC 0.15* 0.22* <0.10* -- -- -- --  CREATININE 0.84 -- 0.85 -- -- 1.20* --  CKTOTAL -- -- -- -- -- -- --  CKMB -- -- -- -- -- -- --  TROPONINI -- -- <0.30 <0.30 <0.30 -- --   Estimated Creatinine Clearance: 50 ml/min (by C-G formula based on Cr of 0.84).  Assessment: 78 yo female with Afib for Heparin.   Goal of Therapy:  Heparin level 0.3-0.7 units/ml Monitor platelets by anticoagulation protocol: Yes   Plan:  Heparin 2000 units IV bolus, then increase heparin 1650 units/hr Check heparin level in 6 hours.  Geannie Risen, PharmD, BCPS  04/23/2012,4:19 AM

## 2012-04-23 NOTE — Progress Notes (Addendum)
Called abg results to dr. Marchelle Gearing  PH [phone] and co2 of 61.0.  Orders to continue bi pap

## 2012-04-23 NOTE — Progress Notes (Signed)
OT Cancellation Note  Patient Details Name: [name] MRN: [id number] DOB: 12/03/1929   Cancelled Treatment:    Reason Eval/Treat Not Completed: Medical issues which prohibited therapy - Increased HR and now on BiPAP with RR in low 40s  Jeani Hawking M [phone] 04/23/2012, 1:05 PM

## 2012-04-23 NOTE — Consult Note (Signed)
Name: [name] MRN: [id number] DOB: 07-24-1929    LOS: 2 Date of admit 04/21/2012   Referring Provider:  Dr Antoine Poche cards Reason for Referral:  Acute Respiratory failure  PULMONARY / CRITICAL CARE MEDICINE  HPI:  78 year old COPD NOS, mild dementia per chart (cognitively with capacity on 04/23/12), AVM colon hx, DM-2, PAF, osteoporosis,   patient with diastolic chf and hx of intubation in June 2010 (based on chart review) forRESISTANT ACINETOBACTER lower lobe pneumonia  ? Following ankle fracture then. Admitted with resp distress on 04/21/2012. Being treated for acute diastolic chf but wiuthout improvement despite 1400cc negative. On 04/23/12 AM noted to have decompensation with resp status overnight and needing 50% face mask and RR 24-30 but speaking full sentences. CXR c/w LLL PNa and bialteral infiltrates. PCCM consulted 04/23/12. Patient admitted as full code but after discussion with CCM patient NO CPR but full medical care and desiring short term ventilation only (no LTAC, no trach)  Note:  Last admit to [place] was 2010 but had one admit interim to [facility], [place] DEVICES bipap 04/23/12 >> Foley ? 04/21/2012 >>  MICRO Results for orders placed during the hospital encounter of 04/21/12  MRSA PCR SCREENING     Status: Normal   Collection Time   04/21/12  5:25 PM      Component Value Range Status Comment   MRSA by PCR NEGATIVE  NEGATIVE Final     ANTIBIOTICs Anti-infectives     Start     Dose/Rate Route Frequency Ordered Stop   04/23/12 1700   levofloxacin (LEVAQUIN) IVPB 750 mg        750 mg 100 mL/hr over 90 Minutes Intravenous Every 24 hours 04/22/12 1639     04/22/12 1630   levofloxacin (LEVAQUIN) IVPB 750 mg  Status:  Discontinued        750 mg 100 mL/hr over 90 Minutes Intravenous Every 24 hours 04/22/12 1618 04/22/12 1639             Past Medical History  Diagnosis Date  . Allergy     allergic Rhintis  . Hyperlipidemia   . Hypertension   .  Osteoporosis   . PAF (paroxysmal atrial fibrillation)     a. dates back > 5 yrs, prev on coumadin->d/c 2/2 GIB;  b. 12/2011 Echo:  EF 60-65%, nl wall motion Triv MR/TR.  [name] Carotid stenosis     a. 09/2007: Carotid Doppler stable- stable bilateral stenosis 40-59% (from 06)  . Type II diabetes mellitus   . Tobacco abuse     a. ongoing - 50+ pack yr hx.  . Dementia     Dementia in Hospital //Does O.K. at home.  . Diverticulosis   . GI bleeding     a. when on coumadin in 2006 - colonoscopy @ that time showed 2 large right colon avm's, multiple small bowel avm's, fresh blood in prox small bowel.  . AVM (arteriovenous malformation) of colon     a. 2006 colonscopy   Past Surgical History  Procedure Date  . Cardiac catheterization 1990's    clear  . Orif tibia & fibula fractures 05/2004  . Small bowel enteroscopy 03/2005    AVMS   Prior to Admission medications   Medication Sig Start Date End Date Taking? Authorizing Provider  amLODipine (NORVASC) 10 MG tablet Take 1 tablet (10 mg total) by mouth daily. 08/05/11  Yes Judy Pimple, MD  aspirin 81 MG tablet Take 81 mg by mouth daily.  Yes Historical Provider, MD  calcium carbonate (OS-CAL) 600 MG TABS Take 600 mg by mouth 2 (two) times daily with a meal.    Yes Historical Provider, MD  Cholecalciferol (VITAMIN D3) 2000 UNITS capsule Take 2,000 Units by mouth daily.     Yes Historical Provider, MD  glyBURIDE (DIABETA) 5 MG tablet Take 1 tablet (5 mg total) by mouth daily with breakfast. 08/05/11  Yes Judy Pimple, MD  lisinopril (PRINIVIL,ZESTRIL) 5 MG tablet Take 1 tablet (5 mg total) by mouth daily. 08/05/11  Yes Judy Pimple, MD  Multiple Vitamin (MULTIVITAMIN) tablet Take 1 tablet by mouth daily.     Yes Historical Provider, MD  simvastatin (ZOCOR) 20 MG tablet Take 1 tablet (20 mg total) by mouth daily. 08/05/11  Yes Judy Pimple, MD  fluticasone (FLONASE) 50 MCG/ACT nasal spray 2 sprays by Nasal route daily. 11/21/10 11/21/11  Audrie Gallus Tower,  MD  glucose blood (ONE TOUCH ULTRA TEST) test strip 1 each by Other route as needed. Use as instructed Check blood sugar daily and as needed     Historical Provider, MD   Allergies Allergies  Allergen Reactions  . Azithromycin     REACTION: inc blood sugar  . Codeine Itching  . Coumadin (Warfarin) Other (See Comments)    Causes bleeding  . Oxybutynin Chloride Er     Constipation/ dizziness/ dry mouth    Family History Family History  Problem Relation Age of Onset  . Stroke Mother     a. believes she died suddenly @ 74.  . Diabetes Mother   . Stroke Father     Died from cerebral hemorrhage @ 59  . Diabetes Father   . Cancer Sister     colon and stomach cancer  . Diabetes Brother   . Heart disease Brother   . Cancer Brother     died from lung cancer  . Heart disease Brother     MI  . Cancer Sister     ? liver//brain cancer  . Heart disease Sister   . Diabetes Sister     diabetes and glaucoma  . Cancer Brother     pancreatic cancer  . Diabetes Son    Social History  reports that she has been smoking Cigarettes.  She has a 25 pack-year smoking history. She has never used smokeless tobacco. She reports that she does not drink alcohol or use illicit drugs.  Review Of Systems:  *per HPI   Events Since Admission: 04/21/2012 12:34 PM >>    Vital Signs: Temp:  [97.9 F (36.6 C)-99 F (37.2 C)] 98.5 F (36.9 C) (10/24 0810) Pulse Rate:  [72-115] 98  (10/24 0900) Resp:  [19-40] 40  (10/24 0900) BP: (92-124)/(45-73) 115/69 mmHg (10/24 0900) SpO2:  [83 %-99 %] 95 % (10/24 0900) FiO2 (%):  [5 %-50 %] 50 % (10/24 0846) Weight:  [67.6 kg (149 lb 0.5 oz)] 67.6 kg (149 lb 0.5 oz) (10/24 0600)  Physical Examination: General:  FRail eldeerly female, On face mask Neuro:  Alert and Oriented x 3. RASS +1. GCS 15. Moves all 4.. CAM-ICU neg for delirum HEENT:  Face mask on.  Neck:  Supple Cardiovascular:  HR 117. No murmurs Lungs:  RR 34, Acc muscle +,. Full sentences  + Abdomen:  Soft, non tender Musculoskeletal:  CAchectic, No edema, No cyanosis Skin:  INtact   Dg Chest 2 View  04/22/2012  *RADIOLOGY REPORT*  Clinical Data: Follow up possible pneumonia  CHEST -  2 VIEW  Comparison: 04/21/2012  Findings: Multifocal patchy airspace opacities, stable versus mildly increased, possibly reflecting multifocal pneumonia or asymmetric interstitial edema.  Small left pleural effusion.  No pneumothorax.  The heart is top normal in size.  Degenerative changes of the visualized thoracolumbar spine.  IMPRESSION: Multifocal patchy airspace opacities, stable versus mildly increased, possibly reflecting multifocal pneumonia or asymmetric interstitial edema.  Small left pleural effusion.   Original Report Authenticated By: Charline Bills, M.D.    Dg Chest Portable 1 View  04/21/2012  *RADIOLOGY REPORT*  Clinical Data: SOB, cough, congestion, diabetic, Hx of pna, smoker.  PORTABLE CHEST - 1 VIEW  Comparison: 12/16/2008  Findings: The patient is rotated to the left on today's exam, resulting in reduced diagnostic sensitivity and specificity. Patchy airspace opacities noted at the left lung base and in the left mid lung, and to a lesser extent of the right medial lung base.  Indistinct pulmonary vasculature noted.  Atherosclerotic calcification of the aortic arch is observed.  The component of the retrocardiac density and associated lucency could conceivably reflect a hiatal hernia although hiatal hernia was not demonstrated on the prior chest CT of 12/08/2008.  Mild cardiomegaly suspected.  IMPRESSION:  1.  Patchy airspace opacities especially confluent at the left lung base but also in the left mid lung and right lung base, potentially from asymmetric edema or bilateral pneumonia. 2.  Small left pleural effusion is suspected. 3.  Mild cardiomegaly and pulmonary venous hypertension.   Original Report Authenticated By: Dellia Cloud, M.D.    Dg Swallowing Func-speech  Pathology  04/22/2012  Vivi Ferns McCoy, CCC-SLP     04/22/2012  2:51 PM Objective Swallowing Evaluation: Modified Barium Swallowing Study   Patient Details  Name: [name] MRN: [id number] Date of Birth: [birth year]  Today's Date: 04/22/2012 Time: 1420-1440 SLP Time Calculation (min): 20 min  Past Medical History:  Past Medical History  Diagnosis Date  . Allergy     allergic Rhintis  . Hyperlipidemia   . Hypertension   . Osteoporosis   . PAF (paroxysmal atrial fibrillation)     a. dates back > 5 yrs, prev on coumadin->d/c 2/2 GIB;  b.  12/2011 Echo:  EF 60-65%, nl wall motion Triv MR/TR.  [name] Carotid stenosis     a. 09/2007: Carotid Doppler stable- stable bilateral stenosis  40-59% (from 06)  . Type II diabetes mellitus   . Tobacco abuse     a. ongoing - 50+ pack yr hx.  . Dementia     Dementia in Hospital //Does O.K. at home.  . Diverticulosis   . GI bleeding     a. when on coumadin in 2006 - colonoscopy @ that time showed 2  large right colon avm's, multiple small bowel avm's, fresh blood  in prox small bowel.  . AVM (arteriovenous malformation) of colon     a. 2006 colonscopy   Past Surgical History:  Past Surgical History  Procedure Date  . Cardiac catheterization 1990's    clear  . Orif tibia & fibula fractures 05/2004  . Small bowel enteroscopy 03/2005    AVMS   HPI:  Ms. Ayars is an 78 yo with hx of PAF, HTN, COPD ( ongoing  cigarette smoking) admitted with a COPD exacerbation and rapid  atrial fibrillation. Diagnosed with probably bilateral PNA.  Patient reports that she has had PNa frequently since esophagus  was dilated a few years ago.      Assessment / Plan / Recommendation  Clinical Impression  Clinical impression: Patient presents with a functional  oropharyngeal swallow which is largely [facility] with the exception of  intermittent trace pharyngeal residuals noted post swallow (? due  to generalized weakness) which clear with spontaneous dry  swallows. No aspiration or penetration observed. Recommend   continuation of a regular diet, thin liquids with general safe  swallowing and reflux precautions given h/o GERD, esophageal  stenosis. Per RN, son concerned about patient's general decline  for solid foods prior to admission. Although no significant  backflow of bolus into the pharynx noted during today's exam, f/u  esophageal w/u may be beneficial if recurrent stricture or  additional esophageal dysfunction remains a concern. SLP will f/u  briefly at bedside for diet tolerance and education.     Treatment Recommendation  Therapy as outlined in treatment plan below    Diet Recommendation Regular;Thin liquid   Liquid Administration via: Cup;Straw Medication Administration: Whole meds with liquid Supervision: Patient able to self feed;Intermittent supervision  to cue for compensatory strategies Compensations: Slow rate;Small sips/bites Postural Changes and/or Swallow Maneuvers: Seated upright 90  degrees;Upright 30-60 min after meal    Other  Recommendations Oral Care Recommendations: Oral care BID   Follow Up Recommendations  None    Frequency and Duration min 2x/week  2 weeks   Pertinent Vitals/Pain n/a    SLP Swallow Goals Patient will utilize recommended strategies during swallow to  increase swallowing safety with: Supervision/safety Swallow Study Goal #2 - Progress: Not met   General HPI: Ms. Lattner is an 78 yo with hx of PAF, HTN, COPD (  ongoing cigarette smoking) admitted with a COPD exacerbation and  rapid atrial fibrillation. Diagnosed with probably bilateral PNA.  Patient reports that she has had PNa frequently since esophagus  was dilated a few years ago.  Type of Study: Modified Barium Swallowing Study Reason for Referral: Objectively evaluate swallowing function Previous Swallow Assessment: MBS 12/13/08-dysphagia 3, nectar  thick liquids. 01/10/09-advanced to regular diet, thin  liquids-flash penetration Diet Prior to this Study: Regular;Thin liquids Temperature Spikes Noted: No Respiratory Status:  Supplemental O2 delivered via (comment) (5 L  nasal cannula) History of Recent Intubation: No Behavior/Cognition: Alert;Cooperative;Pleasant mood Oral Cavity - Dentition: Missing dentition Oral Motor / Sensory Function: Within functional limits Self-Feeding Abilities: Able to feed self Patient Positioning: Upright in chair Baseline Vocal Quality: Hoarse Volitional Cough: Strong Volitional Swallow: Able to elicit Anatomy: Within functional limits Pharyngeal Secretions: Not observed secondary MBS    Reason for Referral Objectively evaluate swallowing function   Oral Phase Oral Preparation/Oral Phase Oral Phase: WFL   Pharyngeal Phase Pharyngeal Phase Pharyngeal Phase: Impaired Pharyngeal - Thin Pharyngeal - Thin Cup: Delayed swallow initiation;Premature  spillage to valleculae;Pharyngeal residue - valleculae;Pharyngeal  residue - pyriform sinuses (trace residuals clear with  spontaneous dry swallow) Pharyngeal - Thin Straw: Delayed swallow initiation;Premature  spillage to valleculae;Pharyngeal residue - valleculae;Pharyngeal  residue - pyriform sinuses (trace residuals clear with  spontaneous dry swallow) Pharyngeal - Solids Pharyngeal - Puree: Delayed swallow initiation;Premature spillage  to valleculae Pharyngeal - Regular: Delayed swallow initiation;Premature  spillage to valleculae Pharyngeal - Pill: Within functional limits  Cervical Esophageal Phase    GO   Ferdinand Lango MA, CCC-SLP [phone]  Cervical Esophageal Phase Cervical Esophageal Phase: [facility]       McCoy Leah [name] 04/22/2012, 2:50 PM      Principal Problem:  *Acute and chronic respiratory failure Active Problems:  HYPERCHOLESTEROLEMIA  TOBACCO USE  HYPERTENSION  Atrial fibrillation  DIABETES  MELLITUS, TYPE II  Dementia  Acute diastolic CHF (congestive heart failure)  COPD (chronic obstructive pulmonary disease)   ASSESSMENT AND PLAN  PULMONARY No results found for this basename: PHART:5,PCO2:5,PCO2ART:5,PO2ART:5,HCO3:5,O2SAT:5 in the  last 168 hours Ventilator Settings: Vent Mode:  [-]  FiO2 (%):  [5 %-50 %] 50 %  A:  Acute on chronic resp failure to COPD, CHF and PNA.  P:   bipap Intubate short term if needed   CARDIOVASCULAR  Lab 04/22/12 0440 04/22/12 0019 04/21/12 1819 04/21/12 1313  TROPONINI <0.30 <0.30 <0.30 --  LATICACIDVEN -- -- -- --  PROBNP -- -- -- 6141.0*     A: Acute diastolic CHF P:  Per cards  RENAL  Lab 04/23/12 0134 04/22/12 0440 04/21/12 1347  NA 138 139 137  K 3.6 3.9 --  CL 92* 95* 99  CO2 35* 35* --  BUN 36* 42* 49*  CREATININE 0.84 0.85 1.20*  CALCIUM 9.1 9.3 --  MG -- -- --  PHOS -- -- --   Intake/Output      10/23 0701 - 10/24 0700 10/24 0701 - 10/25 0700   P.O. 240    I.V. (mL/kg) 230.7 (3.4)    IV Piggyback     Total Intake(mL/kg) 470.7 (7)    Urine (mL/kg/hr) 1925 (1.2)    Total Output 1925    Net -1454.3            A:  Normal P:   monitor  GASTROINTESTINAL No results found for this basename: AST:5,ALT:5,ALKPHOS:5,BILITOT:5,PROT:5,ALBUMIN:5 in the last 168 hours  A:  No dyshpagia ? P:  NPO  HEMATOLOGIC  Lab 04/22/12 0440 04/21/12 1347 04/21/12 1313  HGB 12.4 12.6 12.2  HCT 37.5 37.0 36.9  PLT 202 -- 181  INR -- -- --  APTT -- -- --   A:  AT risk for anemia criticl illness P:  - PRBC for hgb </= 6.9gm%    - exceptions are   -  if ACS susepcted/confirmed then transfuse for hgb </= 8.0gm%,  or    -  If septic shock first 24h and scvo2 < 70% then transfuse for hgb </= 9.0gm%   - active bleeding with hemodynamic instability, then transfuse regardless of hemoglobin value   At at all times try to transfuse 1 unit prbc as possible with exception of active hemorrhage    INFECTIOUS  Lab 04/22/12 0440 04/21/12 1313  WBC 6.0 6.5  PROCALCITON -- --   Cultures: Results for orders placed during the hospital encounter of 04/21/12  MRSA PCR SCREENING     Status: Normal   Collection Time   04/21/12  5:25 PM      Component Value Range Status  Comment   MRSA by PCR NEGATIVE  NEGATIVE Final     Antibiotics: Anti-infectives     Start     Dose/Rate Route Frequency Ordered Stop   04/23/12 1700   levofloxacin (LEVAQUIN) IVPB 750 mg        750 mg 100 mL/hr over 90 Minutes Intravenous Every 24 hours 04/22/12 1639     04/22/12 1630   levofloxacin (LEVAQUIN) IVPB 750 mg  Status:  Discontinued        750 mg 100 mL/hr over 90 Minutes Intravenous Every 24 hours 04/22/12 1618 04/22/12 1639           A:  PNA P:   PCT algorthm panc culuture Expand abxx given prior acinetobacter  ENDOCRINE  Lab 04/23/12 0807 04/22/12 2205 04/22/12 1647 04/22/12 1231  04/22/12 0737  GLUCAP 192* 196* 92 305* 198*   A:  Nil acute   P:   ssi  NEUROLOGIC  A:  Intact P:   monitor  BEST PRACTICE / DISPOSITION Level of Care:  SDU Primary Service:  cards Consultants:  pccm Code Status:  Made LCB  Diet:  npo DVT Px:  Per cards GI Px:  Per cards Skin Integrity:  Intact per rn Social / Family:  None at bedside   The patient is critically ill with multiple organ systems failure and requires high complexity decision making for assessment and support, frequent evaluation and titration of therapies, application of advanced monitoring technologies and extensive interpretation of multiple databases.   Critical Care Time devoted to patient care services described in this note is  45  Minutes.  Dr. Kalman Shan, M.D., [facility].C.P Pulmonary and Critical Care Medicine Staff Physician [facility] [place] [facility] Pager: [phone], If no answer or between  15:00h - 7:00h: call [phone]  04/23/2012 10:51 AM

## 2012-04-23 NOTE — Progress Notes (Signed)
Night cross cover note: Rates occasionally below 60. Instructed nursing to turn off dilt gtt for rates < 60, and SBP < 90. Goal for rates to be < 100-105.  Noted to have increasing 02 requirements with sleep. Nursing switched to venturi. With h.o of COPD, goal sats > 88 but wean for > 93. Diuresis also appears to have fallen off. Responded to 40 of lasix IV, reordered for now. Likely COPD also playing a role.

## 2012-04-23 NOTE — Progress Notes (Addendum)
Pt. O2 sats. Unable to maintained  >88%, placed pt.in venturi mask started at 35% then up to 50%. O2 sats now in the 88%-90%  with venturi mask, Dr. Adolm Joseph made aware of pts. resp. status. Will cont. to monitor.

## 2012-04-23 NOTE — Progress Notes (Signed)
Pt. HR noted to be the low 60's and 70's and at times  in the low 58 but not sustained very irregular SBP was 90's-100's pt. Asymptomatic, also desats in the low 80's at 5l nasal cannula. Cardizem gtt. Titrated to 5mg ./hr. Dr Veryl Speak notified of pts. HR. and BP and O2 sats. With order to titrate down cardizem to 2.5mg ./hr.  And keep HR >60 and SBP >85. Call if pts. HR sustained in the low 50's and and keep O2 sats >88% at 5l. Will cont.to monitor pt. [name]

## 2012-04-23 NOTE — Progress Notes (Signed)
Speech Language Pathology Dysphagia Treatment Patient Details Name: ELSIA WOLFREY MRN: [id number] DOB: [birth year] Today's Date: 04/23/2012 Time: 9562-1308 SLP Time Calculation (min): 20 min  Assessment / Plan / Recommendation Clinical Impression  Pt observed consuming breakfast, on Venti mask with O2 sats in mid eighties. Pt struggling to coordinate removal of mask and consumption of PO. With assist from SLp to remove and replace mask as pt self feeds, sats remained stable in upper eighties. No overt sings of aspiration observed consistent with MBS finding of normal oropharyngeal swallow. Pt at greater risk due to RR and O2 saturation. Provided aspriation precautions. MD plans to make pt NPO and put on BiPAP today. Pt may resume POs when off BiPAP with aspiration precautions. (Full supervision, small sips, no straws, pills whole in puree).     Diet Recommendation  Continue with Current Diet: Regular;Thin liquid    SLP Plan Continue with current plan of care   Pertinent Vitals/Pain NA   Swallowing Goals  SLP Swallowing Goals Patient will consume recommended diet without observed clinical signs of aspiration with: Minimal assistance Patient will utilize recommended strategies during swallow to increase swallowing safety with: Minimal cueing  General Temperature Spikes Noted: No Respiratory Status: Supplemental O2 delivered via (comment) (venti mask) Behavior/Cognition: Alert;Cooperative;Pleasant mood Oral Cavity - Dentition: Missing dentition Patient Positioning: Upright in bed  Oral Cavity - Oral Hygiene Does patient have any of the following "at risk" factors?: Oxygen therapy - cannula, mask, simple oxygen devices Patient is AT RISK - Oral Care Protocol followed (see row info): Yes   Dysphagia Treatment Treatment focused on: Skilled observation of diet tolerance;Patient/family/caregiver education;Utilization of compensatory strategies Treatment Methods/Modalities: Skilled  observation Patient observed directly with PO's: Yes Type of PO's observed: Regular;Thin liquids Feeding: Needs assist Liquids provided via: Cup;Straw Type of cueing: Verbal Amount of cueing: Minimal   GO   Harlon Ditty, MA CCC-SLP [phone]   Claudine Mouton 04/23/2012, 3:41 PM

## 2012-04-23 NOTE — Progress Notes (Signed)
Pt respirations 30-40.  sats 91%  desats to 86%.  Auscultated crackles bilaterally.   Pt has labored respirations.  States it is harder to breathe.  Notified dr. Antoine Poche.  Dr. Antoine Poche in to see pt.   Pulmonology consult ordered

## 2012-04-23 NOTE — Progress Notes (Signed)
SUBJECTIVE:  She denies any SOB or chest pain.  However, she is requiring 50% face mask and O2 sats are now 90%.     PHYSICAL EXAM Filed Vitals:   04/23/12 0500 04/23/12 0600 04/23/12 0800 04/23/12 0810  BP: 103/60 98/61 102/64   Pulse: 105   108  Temp:    98.5 F (36.9 C)  TempSrc:    Oral  Resp: 33 32    Height:      Weight:  149 lb 0.5 oz (67.6 kg)    SpO2: 90%   92%   General:  No acute distress on facemask O2 Lungs:  Decreased breath sounds Heart:  Irregular Abdomen:  Positive bowel sounds, no rebound no guarding Extremities:  No edema Neuro:  Nonfocal. Mild confusion.   LABS: Lab Results  Component Value Date   CKTOTAL 75 12/08/2008   CKMB 1.2 12/08/2008   TROPONINI <0.30 04/22/2012   Results for orders placed during the hospital encounter of 04/21/12 (from the past 24 hour(s))  GLUCOSE, CAPILLARY     Status: Abnormal   Collection Time   04/22/12 12:31 PM      Component Value Range   Glucose-Capillary 305 (*) 70 - 99 mg/dL  HEPARIN LEVEL (UNFRACTIONATED)     Status: Abnormal   Collection Time   04/22/12  4:21 PM      Component Value Range   Heparin Unfractionated 0.22 (*) 0.30 - 0.70 IU/mL  HEMOGLOBIN A1C     Status: Abnormal   Collection Time   04/22/12  4:21 PM      Component Value Range   Hemoglobin A1C 7.6 (*) <5.7 %   Mean Plasma Glucose 171 (*) <117 mg/dL  GLUCOSE, CAPILLARY     Status: Normal   Collection Time   04/22/12  4:47 PM      Component Value Range   Glucose-Capillary 92  70 - 99 mg/dL  GLUCOSE, CAPILLARY     Status: Abnormal   Collection Time   04/22/12 10:05 PM      Component Value Range   Glucose-Capillary 196 (*) 70 - 99 mg/dL   Comment 1 Notify RN    HEPARIN LEVEL (UNFRACTIONATED)     Status: Abnormal   Collection Time   04/23/12  1:34 AM      Component Value Range   Heparin Unfractionated 0.15 (*) 0.30 - 0.70 IU/mL  BASIC METABOLIC PANEL     Status: Abnormal   Collection Time   04/23/12  1:34 AM      Component Value Range     Sodium 138  135 - 145 mEq/L   Potassium 3.6  3.5 - 5.1 mEq/L   Chloride 92 (*) 96 - 112 mEq/L   CO2 35 (*) 19 - 32 mEq/L   Glucose, Bld 229 (*) 70 - 99 mg/dL   BUN 36 (*) 6 - 23 mg/dL   Creatinine, Ser 1.47  0.50 - 1.10 mg/dL   Calcium 9.1  8.4 - 82.9 mg/dL   GFR calc non Af Amer 64 (*) >90 mL/min   GFR calc Af Amer 74 (*) >90 mL/min    Intake/Output Summary (Last 24 hours) at 04/23/12 0843 Last data filed at 04/23/12 0500  Gross per 24 hour  Intake 417.71 ml  Output   1925 ml  Net -1507.29 ml    EKG:  Atrial fibrillation, rate 95.  No acute ST T wave changes.   ECHO:  - Left ventricle: The cavity size was normal. Systolic function  was normal. The estimated ejection fraction was in the range of 55% to 60%. Wall motion was normal; there were no regional wall motion abnormalities. - Mitral valve: Mild regurgitation. - Left atrium: The atrium was moderately dilated. - Right atrium: The atrium was mildly dilated. - Atrial septum: No defect or patent foramen ovale was identified. - Pulmonary arteries: PA peak pressure: 38mm Hg (S). - Pericardium, extracardiac: A trivial pericardial effusion was identified posterior to the heart.  ASSESSMENT AND PLAN:  Respiratory failure:  Worsening oxygenation with worsening chest x-ray. Antibiotics were added yesterday. She has not improved with diuresis. I think this appears to be predominantly pulmonary with an infectious etiology. I have consult critical care medicine.  CHF:  I do think there is a component of diastolic dysfunction.  However, as above I do not think this is the major etiology.  For now I will continue the IV Lasix therapy.  Atrial fibrillation:  She remains on heparin and for now IV diltiazem.     [name] [facility] 04/23/2012 8:43 AM

## 2012-04-23 NOTE — Progress Notes (Signed)
ANTIBIOTIC CONSULT NOTE - INITIAL  Pharmacy Consult for  Vancomycin and Zosyn Indication: pneumonia  Allergies  Allergen Reactions  . Azithromycin     REACTION: inc blood sugar  . Codeine Itching  . Coumadin (Warfarin) Other (See Comments)    Causes bleeding  . Oxybutynin Chloride Er     Constipation/ dizziness/ dry mouth   Patient Measurements: Height: 5\' 6"  (167.6 cm) Weight: 149 lb 0.5 oz (67.6 kg) IBW/kg (Calculated) : 59.3   Vital Signs: Temp: 98.4 F (36.9 C) (10/24 1248) Temp src: Axillary (10/24 1248) BP: 118/65 mmHg (10/24 1211) Pulse Rate: 105  (10/24 1248) Intake/Output from previous day: 10/23 0701 - 10/24 0700 In: 470.7 [P.O.:240; I.V.:230.7] Out: 1925 [Urine:1925] Intake/Output from this shift: Total I/O In: -  Out: 350 [Urine:350]  Labs:  [facility] 04/23/12 0134 04/22/12 0440 04/21/12 1347 04/21/12 1313  WBC -- 6.0 -- 6.5  HGB -- 12.4 12.6 12.2  PLT -- 202 -- 181  LABCREA -- -- -- --  CREATININE 0.84 0.85 1.20* --   Estimated Creatinine Clearance: 50 ml/min (by C-G formula based on Cr of 0.84).  Microbiology: Recent Results (from the past 720 hour(s))  MRSA PCR SCREENING     Status: Normal   Collection Time   04/21/12  5:25 PM      Component Value Range Status Comment   MRSA by PCR NEGATIVE  NEGATIVE Final    Medical History: Past Medical History  Diagnosis Date  . Allergy     allergic Rhintis  . Hyperlipidemia   . Hypertension   . Osteoporosis   . PAF (paroxysmal atrial fibrillation)     a. dates back > 5 yrs, prev on coumadin->d/c 2/2 GIB;  b. 12/2011 Echo:  EF 60-65%, nl wall motion Triv MR/TR.  [name] Carotid stenosis     a. 09/2007: Carotid Doppler stable- stable bilateral stenosis 40-59% (from 06)  . Type II diabetes mellitus   . Tobacco abuse     a. ongoing - 50+ pack yr hx.  . Dementia     Dementia in Hospital //Does O.K. at home.  . Diverticulosis   . GI bleeding     a. when on coumadin in 2006 - colonoscopy @ that time showed 2  large right colon avm's, multiple small bowel avm's, fresh blood in prox small bowel.  . AVM (arteriovenous malformation) of colon     a. 2006 colonscopy   Medications:  Anti-infectives     Start     Dose/Rate Route Frequency Ordered Stop   04/23/12 1700   levofloxacin (LEVAQUIN) IVPB 750 mg        750 mg 100 mL/hr over 90 Minutes Intravenous Every 24 hours 04/22/12 1639     04/23/12 1400  piperacillin-tazobactam (ZOSYN) IVPB 3.375 g       3.375 g 100 mL/hr over 30 Minutes Intravenous 3 times per day 04/23/12 1058     04/23/12 1200   vancomycin (VANCOCIN) IVPB 1000 mg/200 mL premix        1,000 mg 200 mL/hr over 60 Minutes Intravenous Every 24 hours 04/23/12 1057     04/22/12 1630   levofloxacin (LEVAQUIN) IVPB 750 mg  Status:  Discontinued        750 mg 100 mL/hr over 90 Minutes Intravenous Every 24 hours 04/22/12 1618 04/22/12 1639         Assessment: 78yo female is admitted with a 4 day h/o cough and dyspnea.  She is currently afebrile and WBC is wnl.  She currently has  Levofloxacin 750 mg daily ordered and we have been asked to dose her Vancomycin and Zosyn for broader coverage.  Chest Xray yesterday reveals multifocal PNA or asymmetric interstitial edema.  Her creatinine is 0.84 with an estimated clearance of 50 ml/min.  She was negative for MRSA via PCR.  Goal of Therapy:  Trough 15-20 mcg/ml  Plan:  1.  Vancomycin 1 gm IV every 24 hours 2.  Zosyn 3.375 gm IV every 8 hours 3.  Monitor renal function and dose adjust as needed. 4.  Follow culture data 5.  Obtain steady state levels if continued > 72 hours.  Nadara Mustard, PharmD., MS Clinical Pharmacist Pager:  [phone] Thank you for allowing pharmacy to be part of this patients care team. 04/23/2012,12:50 PM

## 2012-04-23 NOTE — Progress Notes (Signed)
[facility]  Pulmonary critical care called in to manage patient's respiratory distress. [facility] will sign off. Please call back if further assistance needed.  Calvert Cantor, MD [phone]

## 2012-04-23 NOTE — Progress Notes (Signed)
Inpatient Diabetes Program Recommendations  AACE/ADA: New Consensus Statement on Inpatient Glycemic Control (2013)  Target Ranges:  Prepandial:   less than 140 mg/dL      Peak postprandial:   less than 180 mg/dL (1-2 hours)      Critically ill patients:  140 - 180 mg/dL   Results for BONNEE, ESTELA (MRN [id number]) as of 04/23/2012 13:23  Ref. Range 04/22/2012 07:37 04/22/2012 12:31 04/22/2012 16:47 04/22/2012 22:05 04/23/2012 08:07 04/23/2012 12:47  Glucose-Capillary Latest Range: 70-99 mg/dL 696 (H) 295 (H) 92 284 (H) 192 (H) 320 (H)   Inpatient Diabetes Program Recommendations Insulin - Meal Coverage: Increase Novolog with meals to 5 units once diet advances HgbA1C: =7.6  Thank you  Piedad Climes RN,BSN,CDE Inpatient Diabetes Coordinator [phone] (8am-3pm today) [phone] (after 3pm today)

## 2012-04-23 NOTE — Progress Notes (Signed)
ANTICOAGULATION CONSULT NOTE   Pharmacy Consult for Heparin Indication: atrial fibrillation  Allergies  Allergen Reactions  . Azithromycin     REACTION: inc blood sugar  . Codeine Itching  . Coumadin (Warfarin) Other (See Comments)    Causes bleeding  . Oxybutynin Chloride Er     Constipation/ dizziness/ dry mouth   Patient Measurements: Height: 5\' 6"  (167.6 cm) Weight: 149 lb 0.5 oz (67.6 kg) IBW/kg (Calculated) : 59.3   Vital Signs: Temp: 98.4 F (36.9 C) (10/24 1248) Temp src: Axillary (10/24 1248) BP: 118/65 mmHg (10/24 1211) Pulse Rate: 105  (10/24 1248)  Labs:  Basename 04/23/12 1145 04/23/12 0134 04/22/12 1621 04/22/12 0440 04/22/12 0019 04/21/12 1819 04/21/12 1347 04/21/12 1313  HGB -- -- -- 12.4 -- -- 12.6 --  HCT -- -- -- 37.5 -- -- 37.0 36.9  PLT -- -- -- 202 -- -- -- 181  APTT -- -- -- -- -- -- -- --  LABPROT -- -- -- -- -- -- -- --  INR -- -- -- -- -- -- -- --  HEPARINUNFRC 0.27* 0.15* 0.22* -- -- -- -- --  CREATININE -- 0.84 -- 0.85 -- -- 1.20* --  CKTOTAL -- -- -- -- -- -- -- --  CKMB -- -- -- -- -- -- -- --  TROPONINI -- -- -- <0.30 <0.30 <0.30 -- --   Estimated Creatinine Clearance: 50 ml/min (by C-G formula based on Cr of 0.84).  Assessment: 78 yo female with Afib for Heparin.  She has a past medical history that is significant for a GI bleed.  She was on Warfarin for afib but this was discontinued due to GI bleed.  Her heparin level this morning is 0.27 on an IV heparin rate of 1750 units/hr which is just below desired goal range.  Her last CBC on 10/23 was stable.     Goal of Therapy:  Heparin level 0.3-0.7 units/ml Monitor platelets by anticoagulation protocol: Yes   Plan:  Increase IV Heparin to 1750 units/hr.  Check heparin level with AM labs.  Nadara Mustard, PharmD., MS Clinical Pharmacist Pager:  [phone] Thank you for allowing pharmacy to be part of this patients care team. 04/23/2012,1:08 PM

## 2012-04-24 DIAGNOSIS — J9620 Acute and chronic respiratory failure, unspecified whether with hypoxia or hypercapnia: Secondary | ICD-10-CM

## 2012-04-24 LAB — BLOOD GAS, ARTERIAL
Acid-Base Excess: 12.9 mmol/L — ABNORMAL HIGH (ref 0.0–2.0)
FIO2: 0.5 %
Inspiratory PAP: 14
Mode: POSITIVE
O2 Saturation: 94.9 %
TCO2: 40.2 mmol/L (ref 0–100)
pCO2 arterial: 63.2 mmHg (ref 35.0–45.0)
pO2, Arterial: 75.2 mmHg — ABNORMAL LOW (ref 80.0–100.0)

## 2012-04-24 LAB — BASIC METABOLIC PANEL
BUN: 34 mg/dL — ABNORMAL HIGH (ref 6–23)
CO2: 37 mEq/L — ABNORMAL HIGH (ref 19–32)
Chloride: 91 mEq/L — ABNORMAL LOW (ref 96–112)
Creatinine, Ser: 0.87 mg/dL (ref 0.50–1.10)
GFR calc Af Amer: 71 mL/min — ABNORMAL LOW (ref >90)
Potassium: 3.3 mEq/L — ABNORMAL LOW (ref 3.5–5.1)

## 2012-04-24 LAB — PROCALCITONIN: Procalcitonin: 0.47 ng/mL

## 2012-04-24 LAB — GLUCOSE, CAPILLARY
Glucose-Capillary: 168 mg/dL — ABNORMAL HIGH (ref 70–99)
Glucose-Capillary: 247 mg/dL — ABNORMAL HIGH (ref 70–99)
Glucose-Capillary: 153 mg/dL — ABNORMAL HIGH (ref 70–99)
Glucose-Capillary: 184 mg/dL — ABNORMAL HIGH (ref 70–99)

## 2012-04-24 LAB — HEPARIN LEVEL (UNFRACTIONATED): Heparin Unfractionated: 0.33 IU/mL (ref 0.30–0.70)

## 2012-04-24 LAB — LEGIONELLA ANTIGEN, URINE: Legionella Antigen, Urine: NEGATIVE

## 2012-04-24 MED ORDER — DILTIAZEM HCL 30 MG PO TABS
30.0000 mg | ORAL_TABLET | Freq: Three times a day (TID) | ORAL | Status: DC
  Administered 2012-04-24 – 2012-04-26 (×6): 30 mg via ORAL
  Filled 2012-04-24 (×9): qty 1

## 2012-04-24 MED ORDER — POTASSIUM CHLORIDE CRYS ER 20 MEQ PO TBCR
40.0000 meq | EXTENDED_RELEASE_TABLET | Freq: Once | ORAL | Status: AC
  Administered 2012-04-24: 40 meq via ORAL
  Filled 2012-04-24: qty 2

## 2012-04-24 MED ORDER — SODIUM CHLORIDE 0.9 % IV SOLN
INTRAVENOUS | Status: DC
  Administered 2012-04-24: via INTRAVENOUS
  Administered 2012-04-27: 10 mL/h via INTRAVENOUS
  Administered 2012-05-05: 03:00:00 via INTRAVENOUS

## 2012-04-24 MED ORDER — DEXTROSE 5 % IV SOLN
1.0000 g | Freq: Three times a day (TID) | INTRAVENOUS | Status: DC
  Administered 2012-04-24 – 2012-05-01 (×21): 1 g via INTRAVENOUS
  Filled 2012-04-24 (×23): qty 1

## 2012-04-24 NOTE — Progress Notes (Signed)
Placed pt. On bipap. Pt. Is tolerating well at this time. RT will continue to monitor. 

## 2012-04-24 NOTE — Progress Notes (Signed)
Placed on 50% venti mask post ABG

## 2012-04-24 NOTE — Progress Notes (Signed)
Name: [name] MRN: [id number] DOB: [birth year]    LOS: 3 Date of admit 04/21/2012   Referring Provider:  Dr Antoine Poche cards Reason for Referral:  Acute Respiratory failure  PULMONARY / CRITICAL CARE MEDICINE  HPI:  78 year old COPD NOS, mild dementia per chart (cognitively with capacity on 04/23/12), AVM colon hx, DM-2, PAF, osteoporosis,   patient with diastolic chf and hx of intubation in June 2010 (based on chart review) for RESISTANT ACINETOBACTER lower lobe pneumonia  ? Following ankle fracture then. Admitted with resp distress on 04/21/2012. Being treated for acute diastolic chf but without improvement despite 1400cc negative. On 04/23/12 AM noted to have decompensation with resp status overnight and needing 50% face mask and RR 24-30 but speaking full sentences. CXR c/w bilateral infiltrates. PCCM consulted 04/23/12. Patient admitted as full code but after discussion with CCM patient NO CPR but full medical care and desiring short term ventilation only (no LTAC, no trach)  Note:  Last admit to [place] was 2010 but had one admit interim to [place], [place] DEVICES bipap 04/23/12 >> F[place] ? 04/21/2012 >>  MICRO Results for orders placed during the hospital encounter of 04/21/12  MRSA PCR SCREENING     Status: Normal   Collection Time   04/21/12  5:25 PM      Component Value Range Status Comment   MRSA by PCR NEGATIVE  NEGATIVE Final     ANTIBIOTICs Anti-infectives     Start     Dose/Rate Route Frequency Ordered Stop   04/24/12 1400   cefTAZidime (FORTAZ) 1 g in dextrose 5 % 50 mL IVPB        1 g 100 mL/hr over 30 Minutes Intravenous 3 times per day 04/24/12 1256     04/23/12 1700   levofloxacin (LEVAQUIN) IVPB 750 mg  Status:  Discontinued        750 mg 100 mL/hr over 90 Minutes Intravenous Every 24 hours 04/22/12 1639 04/24/12 1256   04/23/12 1400   piperacillin-tazobactam (ZOSYN) IVPB 3.375 g  Status:  Discontinued        3.375 g 100 mL/hr over 30 Minutes  Intravenous 3 times per day 04/23/12 1058 04/24/12 1256   04/23/12 1200   vancomycin (VANCOCIN) IVPB 1000 mg/200 mL premix  Status:  Discontinued        1,000 mg 200 mL/hr over 60 Minutes Intravenous Every 24 hours 04/23/12 1057 04/24/12 1256   04/22/12 1630   levofloxacin (LEVAQUIN) IVPB 750 mg  Status:  Discontinued        750 mg 100 mL/hr over 90 Minutes Intravenous Every 24 hours 04/22/12 1618 04/22/12 1639         Events Since Admission: 04/21/2012 12:34 PM >>  Subjective: BiPAP  last 24 hours, currently comfortable on 6L/min [place]  Vital Signs: Temp:  [97.9 F (36.6 C)-98.8 F (37.1 C)] 98 F (36.7 C) (10/25 1254) Pulse Rate:  [72-140] 104  (10/25 1254) Resp:  [26-41] 32  (10/25 1100) BP: (90-120)/(52-69) 96/64 mmHg (10/25 1203) SpO2:  [92 %-97 %] 92 % (10/25 1254) FiO2 (%):  [35 %-60 %] 40 % (10/25 1203) Weight:  [67.6 kg (149 lb 0.5 oz)] 67.6 kg (149 lb 0.5 oz) (10/25 0500)  Intake/Output Summary (Last 24 hours) at 04/24/12 1304 Last data filed at 04/24/12 1200  Gross per 24 hour  Intake 1442.52 ml  Output    795 ml  Net 647.52 ml    Physical Examination: General:  Frail eldeerly female,  comfortable 6L Neuro:  Alert and Oriented x 3. RASS +1. GCS 15. Moves all 4 ext HEENT:  No lesions  Neck:  Supple Cardiovascular:  Regular, 90's. No murmurs Lungs:  RR 34, Acc muscle +,. Full sentences + Abdomen:  Soft, non tender Musculoskeletal:  Cachectic, No edema, No cyanosis Skin:  Intact   Dg Chest 2 View  04/22/2012  *RADIOLOGY REPORT*  Clinical Data: Follow up possible pneumonia  CHEST - 2 VIEW  Comparison: 04/21/2012  Findings: Multifocal patchy airspace opacities, stable versus mildly increased, possibly reflecting multifocal pneumonia or asymmetric interstitial edema.  Small left pleural effusion.  No pneumothorax.  The heart is top normal in size.  Degenerative changes of the visualized thoracolumbar spine.  IMPRESSION: Multifocal patchy airspace opacities, stable  versus mildly increased, possibly reflecting multifocal pneumonia or asymmetric interstitial edema.  Small left pleural effusion.   Original Report Authenticated By: Charline Bills, M.D.      Principal Problem:  *Acute and chronic respiratory failure Active Problems:  HYPERCHOLESTEROLEMIA  TOBACCO USE  HYPERTENSION  Atrial fibrillation  DIABETES MELLITUS, TYPE II  Dementia  Acute diastolic CHF (congestive heart failure)  COPD (chronic obstructive pulmonary disease)   ASSESSMENT AND PLAN  PULMONARY  Lab 04/24/12 0930 04/23/12 1324  PHART 7.399 7.419  PCO2ART 63.2* 61.0*  PO2ART 75.2* 88.0  HCO3 38.3* 39.4*  O2SAT 94.9 96.0   Ventilator Settings: Vent Mode:  [-]  FiO2 (%):  [35 %-60 %] 40 %  A:  Acute on chronic resp failure to COPD, CHF +/- PNA. Clinical picture, WBC, Pct argue against PNA.  P:   bipap prn,  Intubate short term if needed Agree with diuresis Covered empirically for HCAP 10/24 xopenex + atrovent + pulmicort  CARDIOVASCULAR  Lab 04/22/12 0440 04/22/12 0019 04/21/12 1819 04/21/12 1313  TROPONINI <0.30 <0.30 <0.30 --  LATICACIDVEN -- -- -- --  PROBNP -- -- -- 6141.0*    A: Acute diastolic CHF A fib w controlled rate P:  Diuresis Diltiazem to po ASA Atorvastatin   RENAL  Lab 04/24/12 0440 04/23/12 0134 04/22/12 0440 04/21/12 1347  NA 138 138 139 137  K 3.3* 3.6 -- --  CL 91* 92* 95* 99  CO2 37* 35* 35* --  BUN 34* 36* 42* 49*  CREATININE 0.87 0.84 0.85 1.20*  CALCIUM 9.2 9.1 9.3 --  MG -- -- -- --  PHOS -- -- -- --   Intake/Output      10/24 0701 - 10/25 0700 10/25 0701 - 10/26 0700   P.O. 180    I.V. (mL/kg) 1178.4 (17.4) 207.5 (3.1)   IV Piggyback 358    Total Intake(mL/kg) 1716.4 (25.4) 207.5 (3.1)   Urine (mL/kg/hr) 1445 (0.9)    Total Output 1445    Net +271.4 +207.5           A:  Hypokalemia P:   - replaced, following BMP  GASTROINTESTINAL No results found for this basename:  AST:5,ALT:5,ALKPHOS:5,BILITOT:5,PROT:5,ALBUMIN:5 in the last 168 hours  A:  nutrition P:  NPO while requiring BiPAP   HEMATOLOGIC  Lab 04/22/12 0440 04/21/12 1347 04/21/12 1313  HGB 12.4 12.6 12.2  HCT 37.5 37.0 36.9  PLT 202 -- 181  INR -- -- --  APTT -- -- --   A:  AT risk for anemia criticl illness P:  - PRBC for hgb </= 6.9gm%    INFECTIOUS  Lab 04/24/12 0440 04/23/12 1144 04/22/12 0440 04/21/12 1313  WBC -- -- 6.0 6.5  PROCALCITON 0.47 0.15 -- --  Cultures: Results for orders placed during the hospital encounter of 04/21/12  MRSA PCR SCREENING     Status: Normal   Collection Time   04/21/12  5:25 PM      Component Value Range Status Comment   MRSA by PCR NEGATIVE  NEGATIVE Final     Antibiotics: Anti-infectives     Start     Dose/Rate Route Frequency Ordered Stop   04/24/12 1400   cefTAZidime (FORTAZ) 1 g in dextrose 5 % 50 mL IVPB        1 g 100 mL/hr over 30 Minutes Intravenous 3 times per day 04/24/12 1256     04/23/12 1700   levofloxacin (LEVAQUIN) IVPB 750 mg  Status:  Discontinued        750 mg 100 mL/hr over 90 Minutes Intravenous Every 24 hours 04/22/12 1639 04/24/12 1256   04/23/12 1400   piperacillin-tazobactam (ZOSYN) IVPB 3.375 g  Status:  Discontinued        3.375 g 100 mL/hr over 30 Minutes Intravenous 3 times per day 04/23/12 1058 04/24/12 1256   04/23/12 1200   vancomycin (VANCOCIN) IVPB 1000 mg/200 mL premix  Status:  Discontinued        1,000 mg 200 mL/hr over 60 Minutes Intravenous Every 24 hours 04/23/12 1057 04/24/12 1256   04/22/12 1630   levofloxacin (LEVAQUIN) IVPB 750 mg  Status:  Discontinued        750 mg 100 mL/hr over 90 Minutes Intravenous Every 24 hours 04/22/12 1618 04/22/12 1639         A:  ? HCAP - Pct, WBC, clinical hx argue against. Has a hx of resistant acinitobacter from 2010 P:   - follow cx's - prior cx's acinitobacter resistant to zosyn (high MIC) and levaquin, intermediate to ceftaz, ceftriaxone, tobra.  Would narrow abx given 10/25, probably d/c next 24 hours if cx's unrevealing and clinically improving w diuresis. Change 10/25 to ceftaz and follow  ENDOCRINE  Lab 04/24/12 0808 04/23/12 2123 04/23/12 1711 04/23/12 1247 04/23/12 0807  GLUCAP 168* 181* 137* 320* 192*   A:  Hyperglycemia, hx DM P:   ssi  NEUROLOGIC  A:  Intact P:   monitor  BEST PRACTICE / DISPOSITION Level of Care:  SDU Primary Service:  cards Consultants:  pccm Code Status:  Made LCB  Diet:  npo DVT Px:  Heparin GTT GI Px:  PO piet   Levy Pupa, MD, PhD 04/24/2012, 1:13 PM [facility] [phone] or if no answer [phone]

## 2012-04-24 NOTE — Progress Notes (Signed)
PT Cancellation Note  Patient Details Name: [name] MRN: [id number] DOB: 1930-02-11   Cancelled Treatment:    Reason Eval/Treat Not Completed: Medical issues which prohibited therapy   INGOLD,Joshuah Minella 04/24/2012, 10:35 AM [facility] [phone] (pager)

## 2012-04-24 NOTE — Progress Notes (Signed)
ANTICOAGULATION CONSULT NOTE - Follow Up Consult  Pharmacy Consult for Heparin Indication: atrial fibrillation  Allergies  Allergen Reactions  . Azithromycin     REACTION: inc blood sugar  . Codeine Itching  . Coumadin (Warfarin) Other (See Comments)    Causes bleeding  . Oxybutynin Chloride Er     Constipation/ dizziness/ dry mouth    Patient Measurements: Height: 5\' 6"  (167.6 cm) Weight: 149 lb 0.5 oz (67.6 kg) IBW/kg (Calculated) : 59.3  Heparin Dosing Weight: 67 kg  Vital Signs: Temp: 97.9 F (36.6 C) (10/25 0809) Temp src: Axillary (10/25 0809) BP: 100/60 mmHg (10/25 0700) Pulse Rate: 101  (10/25 0700)  Labs:  Basename 04/24/12 0440 04/23/12 1145 04/23/12 0134 04/22/12 0440 04/22/12 0019 04/21/12 1819 04/21/12 1347 04/21/12 1313  HGB -- -- -- 12.4 -- -- 12.6 --  HCT -- -- -- 37.5 -- -- 37.0 36.9  PLT -- -- -- 202 -- -- -- 181  APTT -- -- -- -- -- -- -- --  LABPROT -- -- -- -- -- -- -- --  INR -- -- -- -- -- -- -- --  HEPARINUNFRC 0.33 0.27* 0.15* -- -- -- -- --  CREATININE 0.87 -- 0.84 0.85 -- -- -- --  CKTOTAL -- -- -- -- -- -- -- --  CKMB -- -- -- -- -- -- -- --  TROPONINI -- -- -- <0.30 <0.30 <0.30 -- --    Estimated Creatinine Clearance: 48.3 ml/min (by C-G formula based on Cr of 0.87).   Medications:  Heparin @ 1750 units/hr  Assessment: 82yom continues on heparin for afib. Heparin level is therapeutic after rate increase yesterday. No CBC today (will order daily starting 10/26). No bleeding noted.  Has PMH significant for GI bleed and was taken off of coumadin because of this. Will follow up plans for oral anticoagulation.  Goal of Therapy:  Heparin level 0.3-0.7 units/ml Monitor platelets by anticoagulation protocol: Yes   Plan:  1) Continue heparin at 1750 units/hr 2) Follow up heparin level and CBC in the AM  [name] 04/24/2012,8:39 AM

## 2012-04-24 NOTE — Progress Notes (Signed)
SUBJECTIVE:  Somnolent but able to be aroused.  She follows commands.   PHYSICAL EXAM Filed Vitals:   04/24/12 0500 04/24/12 0600 04/24/12 0700 04/24/12 0809  BP: 106/55 110/62 100/60   Pulse: 91 83 101   Temp:    97.9 F (36.6 C)  TempSrc:    Axillary  Resp: 27 31 26    Height:      Weight: 149 lb 0.5 oz (67.6 kg)     SpO2: 94% 94% 95%    General:  No acute distress on BiPAP Lungs:  Decreased breath sounds Heart:  Irregular Abdomen:  Positive bowel sounds, no rebound no guarding Extremities:  No edema Neuro:  Nonfocal. Very somnolent.  LABS: Lab Results  Component Value Date   CKTOTAL 75 12/08/2008   CKMB 1.2 12/08/2008   TROPONINI <0.30 04/22/2012   Results for orders placed during the hospital encounter of 04/21/12 (from the past 24 hour(s))  PROCALCITONIN     Status: Normal   Collection Time   04/23/12 11:44 AM      Component Value Range   Procalcitonin 0.15    HEPARIN LEVEL (UNFRACTIONATED)     Status: Abnormal   Collection Time   04/23/12 11:45 AM      Component Value Range   Heparin Unfractionated 0.27 (*) 0.30 - 0.70 IU/mL  STREP PNEUMONIAE URINARY ANTIGEN     Status: Normal   Collection Time   04/23/12 12:09 PM      Component Value Range   Strep Pneumo Urinary Antigen NEGATIVE  NEGATIVE  GLUCOSE, CAPILLARY     Status: Abnormal   Collection Time   04/23/12 12:47 PM      Component Value Range   Glucose-Capillary 320 (*) 70 - 99 mg/dL  POCT I-STAT 3, BLOOD GAS (G3+)     Status: Abnormal   Collection Time   04/23/12  1:24 PM      Component Value Range   pH, Arterial 7.419  7.350 - 7.450   pCO2 arterial 61.0 (*) 35.0 - 45.0 mmHg   pO2, Arterial 88.0  80.0 - 100.0 mmHg   Bicarbonate 39.4 (*) 20.0 - 24.0 mEq/L   TCO2 41  0 - 100 mmol/L   O2 Saturation 96.0     Acid-Base Excess 12.0 (*) 0.0 - 2.0 mmol/L   Patient temperature 37.0 C     Collection site RADIAL, ALLEN'S TEST ACCEPTABLE     Drawn by RT     Sample type ARTERIAL     Comment NOTIFIED  PHYSICIAN    GLUCOSE, CAPILLARY     Status: Abnormal   Collection Time   04/23/12  5:11 PM      Component Value Range   Glucose-Capillary 137 (*) 70 - 99 mg/dL  GLUCOSE, CAPILLARY     Status: Abnormal   Collection Time   04/23/12  9:23 PM      Component Value Range   Glucose-Capillary 181 (*) 70 - 99 mg/dL  BASIC METABOLIC PANEL     Status: Abnormal   Collection Time   04/24/12  4:40 AM      Component Value Range   Sodium 138  135 - 145 mEq/L   Potassium 3.3 (*) 3.5 - 5.1 mEq/L   Chloride 91 (*) 96 - 112 mEq/L   CO2 37 (*) 19 - 32 mEq/L   Glucose, Bld 177 (*) 70 - 99 mg/dL   BUN 34 (*) 6 - 23 mg/dL   Creatinine, Ser 6.04  0.50 - 1.10  mg/dL   Calcium 9.2  8.4 - 40.9 mg/dL   GFR calc non Af Amer 61 (*) >90 mL/min   GFR calc Af Amer 71 (*) >90 mL/min  HEPARIN LEVEL (UNFRACTIONATED)     Status: Normal   Collection Time   04/24/12  4:40 AM      Component Value Range   Heparin Unfractionated 0.33  0.30 - 0.70 IU/mL  PROCALCITONIN     Status: Normal   Collection Time   04/24/12  4:40 AM      Component Value Range   Procalcitonin 0.47      Intake/Output Summary (Last 24 hours) at 04/24/12 0841 Last data filed at 04/24/12 0600  Gross per 24 hour  Intake 1669.85 ml  Output   1445 ml  Net 224.85 ml    ASSESSMENT AND PLAN:  Respiratory failure:  On BiPAP.  Sats OK.  I appreciate the help of IM and CCM.  I will continue per their recommendations.  Continue IV Lasix today and perhaps switch to PO in the am.  On antibiotics.    CHF:  I do think there is a component of diastolic dysfunction.  (See above).   Atrial fibrillation:  She remains on heparin I will try to change to PO Cardizem today.  We need to make a decision about long term anticoagulation.  I think this will depend on her disposition at the time of discharge.  I don't think I would send her on anticoagulation if she goes out to live alone again.  AMS:  She opens eyes and follows commands but is more somnolent. I  will order an ABG.    Fayrene Fearing [facility] 04/24/2012 8:41 AM

## 2012-04-24 NOTE — Progress Notes (Signed)
Speech Language Pathology Dysphagia Treatment Patient Details Name: [name] MRN: [id number] DOB: 1930-06-26 Today's Date: 04/24/2012 Time: 4098-1191 SLP Time Calculation (min): 9 min  Assessment / Plan / Recommendation Clinical Impression  Pt NPO, on and off BiPAP this am. SLP stopped by room whan RN needed to give meds while pt on Venti mask at 50% Sat in upper 80s to low 90s. Rn reports CO2 at 60% and will go back on BiPAP shortly. Pt more lethargic, SLP provided verbal and tactile cue sfor pt elicit swallow response and second swallow with meds provided in puree (RN administered). Pt with decreased awareness of bolus, but functional swallow with cues. Pt may continue oral meds while NPO. SLP discussed aspiration precautions with RN if diet resumed. WIll f/u    Diet Recommendation  Continue with Current Diet: NPO    SLP Plan Continue with current plan of care   Pertinent Vitals/Pain NA   Swallowing Goals  SLP Swallowing Goals Patient will consume recommended diet without observed clinical signs of aspiration with: Minimal assistance Swallow Study Goal #1 - Progress: Not Met Patient will utilize recommended strategies during swallow to increase swallowing safety with: Minimal cueing Swallow Study Goal #2 - Progress: Progressing toward goal  General Temperature Spikes Noted: No Respiratory Status: Supplemental O2 delivered via (comment) Behavior/Cognition: Lethargic;Cooperative Oral Cavity - Dentition: Missing dentition Patient Positioning: Upright in bed  Oral Cavity - Oral Hygiene Does patient have any of the following "at risk" factors?: Oxygen therapy - cannula, mask, simple oxygen devices;Nutritional status - inadequate Patient is HIGH RISK - Oral Care Protocol followed (see row info): Yes   Dysphagia Treatment Treatment focused on: Skilled observation of diet tolerance;Facilitation of pharyngeal phase Treatment Methods/Modalities: Skilled observation Patient observed  directly with PO's: Yes Type of PO's observed: Dysphagia 1 (puree) (with pills) Feeding: Total assist Oral Phase Signs & Symptoms: Prolonged bolus formation;Prolonged oral phase Pharyngeal Phase Signs & Symptoms: Suspected delayed swallow initiation;Multiple swallows Type of cueing: Verbal Amount of cueing: Moderate   GO    Harlon Ditty, [place] CCC-SLP [phone]  Claudine Mouton 04/24/2012, 10:42 AM

## 2012-04-24 NOTE — Progress Notes (Deleted)
Pt on BiPAP this am. SLP will defer treatment. If diet resumes, please follow strict aspiration precautions. Thanks, Harlon Ditty, MA CCC-SLP [phone]

## 2012-04-25 ENCOUNTER — Inpatient Hospital Stay (HOSPITAL_COMMUNITY): Payer: PRIVATE HEALTH INSURANCE

## 2012-04-25 LAB — BASIC METABOLIC PANEL
CO2: 44 mEq/L (ref 19–32)
Chloride: 94 mEq/L — ABNORMAL LOW (ref 96–112)
GFR calc Af Amer: 74 mL/min — ABNORMAL LOW (ref >90)
Potassium: 3.9 mEq/L (ref 3.5–5.1)
Sodium: 145 mEq/L (ref 135–145)

## 2012-04-25 LAB — GLUCOSE, CAPILLARY
Glucose-Capillary: 186 mg/dL — ABNORMAL HIGH (ref 70–99)
Glucose-Capillary: 121 mg/dL — ABNORMAL HIGH (ref 70–99)
Glucose-Capillary: 153 mg/dL — ABNORMAL HIGH (ref 70–99)
Glucose-Capillary: 277 mg/dL — ABNORMAL HIGH (ref 70–99)

## 2012-04-25 LAB — CBC
HCT: 39.7 % (ref 36.0–46.0)
Hemoglobin: 12.8 g/dL (ref 12.0–15.0)
MCHC: 32.2 g/dL (ref 30.0–36.0)
RBC: 4.28 MIL/uL (ref 3.87–5.11)
WBC: 11 10*3/uL — ABNORMAL HIGH (ref 4.0–10.5)

## 2012-04-25 LAB — HEPARIN LEVEL (UNFRACTIONATED)
Heparin Unfractionated: <0.1 IU/mL — ABNORMAL LOW (ref 0.30–0.70)
Heparin Unfractionated: 0.36 IU/mL (ref 0.30–0.70)

## 2012-04-25 LAB — PROCALCITONIN: Procalcitonin: 0.26 ng/mL

## 2012-04-25 MED ORDER — DIGOXIN 0.25 MG/ML IJ SOLN
0.5000 mg | Freq: Once | INTRAMUSCULAR | Status: AC
  Administered 2012-04-25: 0.5 mg via INTRAVENOUS
  Filled 2012-04-25: qty 2

## 2012-04-25 MED ORDER — DIGOXIN 0.25 MG/ML IJ SOLN
0.2500 mg | Freq: Two times a day (BID) | INTRAMUSCULAR | Status: AC
  Administered 2012-04-25 – 2012-04-26 (×2): 0.25 mg via INTRAVENOUS
  Filled 2012-04-25 (×2): qty 1

## 2012-04-25 MED ORDER — FUROSEMIDE 10 MG/ML IJ SOLN
40.0000 mg | Freq: Three times a day (TID) | INTRAMUSCULAR | Status: DC
  Administered 2012-04-25 – 2012-04-28 (×9): 40 mg via INTRAVENOUS
  Filled 2012-04-25 (×11): qty 4

## 2012-04-25 MED ORDER — DIGOXIN 250 MCG PO TABS
0.2500 mg | ORAL_TABLET | Freq: Every day | ORAL | Status: DC
  Filled 2012-04-25: qty 1

## 2012-04-25 MED ORDER — DIGOXIN 250 MCG PO TABS: 0.2500 mg | ORAL_TABLET | Freq: Every day | ORAL | Status: DC

## 2012-04-25 NOTE — Progress Notes (Signed)
ANTICOAGULATION CONSULT NOTE - Follow Up Consult  Pharmacy Consult for heparin Indication: atrial fibrillation  Allergies  Allergen Reactions  . Azithromycin     REACTION: inc blood sugar  . Codeine Itching  . Coumadin (Warfarin) Other (See Comments)    Causes bleeding  . Oxybutynin Chloride Er     Constipation/ dizziness/ dry mouth    Patient Measurements: Height: 5\' 6"  (167.6 cm) Weight: 147 lb 0.8 oz (66.7 kg) IBW/kg (Calculated) : 59.3  Heparin Dosing Weight: 67 kg  Vital Signs: Temp: 98.1 F (36.7 C) (10/26 0335) Temp src: Oral (10/26 0335) BP: 119/71 mmHg (10/26 0500) Pulse Rate: 106  (10/26 0500)  Labs:  Basename 04/25/12 0520 04/24/12 0440 04/23/12 1145 04/23/12 0134  HGB 12.8 -- -- --  HCT 39.7 -- -- --  PLT 268 -- -- --  APTT -- -- -- --  LABPROT -- -- -- --  INR -- -- -- --  HEPARINUNFRC <0.10* 0.33 0.27* --  CREATININE -- 0.87 -- 0.84  CKTOTAL -- -- -- --  CKMB -- -- -- --  TROPONINI -- -- -- --    Estimated Creatinine Clearance: 48.3 ml/min (by C-G formula based on Cr of 0.87).   Medications:  Scheduled:    . antiseptic oral rinse  15 mL Mouth Rinse BID  . aspirin  81 mg Oral Daily  . atorvastatin  10 mg Oral q1800  . budesonide  0.25 mg Nebulization BID  . calcium carbonate  1 tablet Oral BID WC  . cefTAZidime (FORTAZ)  IV  1 g Intravenous Q8H  . cholecalciferol  2,000 Units Oral Daily  . diltiazem  30 mg Oral Q8H  . furosemide  40 mg Intravenous BID  . insulin aspart  0-15 Units Subcutaneous TID WC  . insulin aspart  0-5 Units Subcutaneous QHS  . insulin aspart  3 Units Subcutaneous TID WC  . insulin glargine  10 Units Subcutaneous QHS  . ipratropium  0.5 mg Nebulization QID  . levalbuterol  0.63 mg Nebulization Q4H  . multivitamin with minerals  1 tablet Oral Daily  . nicotine  21 mg Transdermal Daily  . potassium chloride  40 mEq Oral Once  . DISCONTD: levofloxacin (LEVAQUIN) IV  750 mg Intravenous Q24H  . DISCONTD:  piperacillin-tazobactam  3.375 g Intravenous Q8H  . DISCONTD: vancomycin  1,000 mg Intravenous Q24H   Infusions:    . sodium chloride 20 mL/hr at 04/24/12 0015  . heparin 1,750 Units/hr (04/25/12 0457)  . DISCONTD: diltiazem (CARDIZEM) infusion 5 mg/hr (04/24/12 0300)    Assessment: 78 yo female with afib is currently on heparin therapy.  Heparin level was < 0.1 because the drip went out for ~30 minutes due to the bag running dry.  Drip was restarted at 0457.  Goal of Therapy:  Heparin level 0.3-0.7 units/ml Monitor platelets by anticoagulation protocol: Yes   Plan:  1) Continue heparin drip at 1750 units/hr (=17.5 ml/hr) 2) Redraw heparin level at 1300.  Leanna Hamid, Tsz-Yin 04/25/2012,6:26 AM

## 2012-04-25 NOTE — Progress Notes (Signed)
Physical Therapy Evaluation Patient Details Name: [name] MRN: [id number] DOB: 08-07-1929 Today's Date: 04/25/2012 Time: 1191-4782 PT Time Calculation (min): 26 min  PT Assessment / Plan / Recommendation Clinical Impression  Pt is 78 yo female with respiratory distress who is still not stable with HR and O2 sats whioch is limiting ability to mobilize.  Pt was able to get out of bed to chair today with +2 assist for safety. Will begin to progress mobility as she stabilizes.  Pt lives alone and does not report options of family support available upon d/c, request SW consult to investigate her home situation further.  At this point, will recommend HHPT and will re-evaluate as she progresses and we know more about her home situation.  PT will follow to increase balance, mobility, and independence.    PT Assessment  Patient needs continued PT services    Follow Up Recommendations  Home health PT;Supervision - Intermittent    Does the patient have the potential to tolerate intense rehabilitation      Barriers to Discharge Decreased caregiver support      Equipment Recommendations  None recommended by PT    Recommendations for Other Services OT consult   Frequency Min 3X/week    Precautions / Restrictions Precautions Precautions: None Restrictions Weight Bearing Restrictions: No   Pertinent Vitals/Pain No c/o pain      Mobility  Bed Mobility Bed Mobility: Supine to Sit;Sitting - Scoot to Edge of Bed Supine to Sit: 4: Min guard;HOB elevated Sitting - Scoot to [place] of Bed: 5: Supervision Details for Bed Mobility Assistance: pt able to sit straight up in bed but takes increased time to do so.  Scoots to EOB but had to be cued verbally several times to realize she needed to do this to be able to stand Transfers Transfers: Sit to Stand;Stand to Sit Sit to Stand: 1: +2 Total assist;From bed;With upper extremity assist Sit to Stand: Patient Percentage: 80% Stand to Sit: 1: +2  Total assist;To chair/3-in-1;With upper extremity assist Stand to Sit: Patient Percentage: 80% Details for Transfer Assistance: pt unsteady with standing, thus given +2 assist for safety, however, pt with sufficient strength for standing and able to wt-shift appropriately to perform mvmt Ambulation/Gait Ambulation/Gait Assistance: 1: +2 Total assist Ambulation/Gait: Patient Percentage: 80% Ambulation Distance (Feet): 3 Feet Assistive device: None Ambulation/Gait Assistance Details: +2 assist given for stability, pt cued for each step, she moved very slowly and seemed somewhat confused by task Gait Pattern: Step-to pattern;Decreased stride length;Narrow base of support Stairs: No Wheelchair Mobility Wheelchair Mobility: No    Shoulder Instructions     Exercises General Exercises - Lower Extremity Ankle Circles/Pumps: AROM;15 reps;Both;Seated Long Arc Quad: AROM;Both;10 reps;Seated   PT Diagnosis: Difficulty walking  PT Problem List: Decreased activity tolerance;Decreased balance;Decreased mobility;Decreased cognition;Decreased coordination;Decreased knowledge of use of DME;Decreased knowledge of precautions PT Treatment Interventions: DME instruction;Gait training;Stair training;Functional mobility training;Therapeutic activities;Therapeutic exercise;Balance training;Cognitive remediation;Patient/family education   PT Goals Acute Rehab PT Goals PT Goal Formulation: With patient Time For Goal Achievement: 05/09/12 Potential to Achieve Goals: Good Pt will go Supine/Side to Sit: with modified independence PT Goal: Supine/Side to Sit - Progress: Goal set today Pt will go Sit to Supine/Side: with modified independence PT Goal: Sit to Supine/Side - Progress: Goal set today Pt will go Sit to Stand: with modified independence PT Goal: Sit to Stand - Progress: Goal set today Pt will go Stand to Sit: with modified independence PT Goal: Stand to Sit - Progress:  Goal set today Pt will  Ambulate: >150 feet;with modified independence PT Goal: Ambulate - Progress: Goal set today Pt will Go Up / Down Stairs: 3-5 stairs;with rail(s);with modified independence PT Goal: Up/Down Stairs - Progress: Goal set today Pt will Perform Home Exercise Program: Independently PT Goal: Perform Home Exercise Program - Progress: Goal set today  Visit Information  Last PT Received On: 04/25/12 Assistance Needed: +1    Subjective Data  Subjective: pt says she feels "lazy" Patient Stated Goal: return home   Prior Functioning  Home Living Lives With: Alone Type of Home: House Home Access: Stairs to enter Secretary/administrator of Steps: 3 Entrance Stairs-Rails: Right Home Layout: One level Bathroom Shower/Tub: Naval architect Equipment: None Additional Comments: pt's son and daughter-in-law live in [place], she seems doubtful that they could stay with her for awhile when she goes home and she says she cannot stay with them because they have 5 dogs Prior Function Level of Independence: Independent Able to Take Stairs?: Yes Driving: Yes Vocation: Retired Musician: No difficulties    Cognition  Overall Cognitive Status: Impaired Area of Impairment: Other (comment) Arousal/Alertness: Awake/alert Orientation Level: Appears intact for tasks assessed Behavior During Session: Flat affect Cognition - Other Comments: pt with somewhat flat affect and slightly slow to process questions and commands however, she does follow all commands and answer questions appropriately.    Extremity/Trunk Assessment Right Upper Extremity Assessment RUE ROM/Strength/Tone: [facility] for tasks assessed Left Upper Extremity Assessment LUE ROM/Strength/Tone: WFL for tasks assessed (though arms shaky when leaning on them in sitting) Right Lower Extremity Assessment RLE ROM/Strength/Tone: Deficits RLE ROM/Strength/Tone Deficits: pt tests 4+/5 on MMT bilateral LE's throughout mvmts,  however, functionally, pt fatigues quicklky sitting EOB, eg arms become shaky when leaning back on them, and legs begin to shake when pt stands. Tolerance for activity demonstrated to be poor, with increased HR up to 139 and O2 sat down to 85% on 6L O2 RLE Sensation: WFL - Light Touch RLE Coordination: WFL - gross motor Left Lower Extremity Assessment LLE ROM/Strength/Tone: Deficits LLE ROM/Strength/Tone Deficits: same as RLE LLE Sensation: WFL - Light Touch LLE Coordination: WFL - gross motor Trunk Assessment Trunk Assessment: Normal   Balance Balance Balance Assessed: Yes Dynamic Standing Balance Dynamic Standing - Balance Support: Bilateral upper extremity supported;During functional activity Dynamic Standing - Level of Assistance: 4: Min assist  End of Session PT - End of Session Equipment Utilized During Treatment: Gait belt;Oxygen Activity Tolerance: Patient limited by fatigue;Treatment limited secondary to medical complications (Comment) (limited by increased HR and decreased O2 sats) Patient left: in chair;with call bell/phone within reach Nurse Communication: Mobility status  GP   Lyanne Co, PT  Acute Rehab Services  [phone]   Lyanne Co 04/25/2012, 9:51 AM

## 2012-04-25 NOTE — Progress Notes (Signed)
ANTICOAGULATION CONSULT NOTE - Follow Up Consult  Pharmacy Consult for Heparin Indication: atrial fibrillation  Allergies  Allergen Reactions  . Azithromycin     REACTION: inc blood sugar  . Codeine Itching  . Coumadin (Warfarin) Other (See Comments)    Causes bleeding  . Oxybutynin Chloride Er     Constipation/ dizziness/ dry mouth    Patient Measurements: Height: 5\' 6"  (167.6 cm) Weight: 147 lb 0.8 oz (66.7 kg) IBW/kg (Calculated) : 59.3  Heparin Dosing Weight: 67kg  Vital Signs: Temp: 98.5 F (36.9 C) (10/26 1143) Temp src: Oral (10/26 1143) BP: 109/70 mmHg (10/26 0900) Pulse Rate: 122  (10/26 0900)  Labs:  Basename 04/25/12 1330 04/25/12 0520 04/24/12 0440 04/23/12 0134  HGB -- 12.8 -- --  HCT -- 39.7 -- --  PLT -- 268 -- --  APTT -- -- -- --  LABPROT -- -- -- --  INR -- -- -- --  HEPARINUNFRC 0.36 <0.10* 0.33 --  CREATININE -- 0.84 0.87 0.84  CKTOTAL -- -- -- --  CKMB -- -- -- --  TROPONINI -- -- -- --    Estimated Creatinine Clearance: 50 ml/min (by C-G formula based on Cr of 0.84).   Medications:  Heparin @ 1750 units/hr  Assessment: 82yof continues on heparin for afib with a therapeutic heparin level. CBC is stable. No bleeding noted.  Goal of Therapy:  Heparin level 0.3-0.7 units/ml Monitor platelets by anticoagulation protocol: Yes   Plan:  1) Continue heparin at 1750 units/hr 2) Follow up heparin level and CBC in AM  [name] 04/25/2012,2:25 PM

## 2012-04-25 NOTE — Progress Notes (Signed)
[name]  78 y.o.  female  Subjective: Patient is fatigued, but alert and communicative. Dyspnea improved. Denies chest discomfort. She reports a productive cough, which was present prior to admission.  Allergy: Azithromycin; Codeine; Coumadin; and Oxybutynin chloride er  Objective: Vital signs in last 24 hours: Temp:  [97.2 F (36.2 C)-98.6 F (37 C)] 97.2 F (36.2 C) (10/26 1545) Pulse Rate:  [63-122] 117  (10/26 1545) Resp:  [24-34] 32  (10/26 1545) BP: (92-119)/(52-75) 92/57 mmHg (10/26 1545) SpO2:  [90 %-96 %] 93 % (10/26 1545) FiO2 (%):  [50 %] 50 % (10/26 0300) Weight:  [66.7 kg (147 lb 0.8 oz)] 66.7 kg (147 lb 0.8 oz) (10/26 0500)  66.7 kg (147 lb 0.8 oz) Body mass index is 23.73 kg/(m^2).  Weight change: -0.9 kg (-1 lb 15.8 oz) Last BM Date: 04/22/12  Intake/Output from previous day: 10/25 0701 - 10/26 0700 In: 1370 [P.O.:300; I.V.:920; IV Piggyback:150] Out: 1025 [Urine:1025] Total I&O: -580 cc; weight decreased 2 kg  General- Well developed; no acute distress Neck- No JVD, no carotid bruits Lungs- mild inspiratory and expiratory rhonchi; prolonged expiratory phase; decreased breath sounds throughout. Cardiovascular- normal PMI; normal S1 and S2; rapid irregular rhythm Abdomen- normal bowel sounds; soft and non-tender without masses or organomegaly Skin- Warm, no significant lesions Extremities- decreased distal pulses; 1/2+ edema  CBC:   Basename 04/25/12 0520  WBC 11.0*  HGB 12.8  HCT 39.7  PLT 268   BMET:  Basename 04/25/12 0520 04/24/12 0440  NA 145 138  K 3.9 3.3*  CL 94* 91*  CO2 44* 37*  GLUCOSE 175* 177*  BUN 34* 34*  CREATININE 0.84 0.87  CALCIUM 9.8 9.2   BNP level: 6141; previously 338 in 11/2008  EKG:  Atrial fibrillation, rapid ventricular response, nonspecific ST-T wave abnormality.  Imaging Studies/Results: Dg Chest Port 1 View  04/25/2012  *RADIOLOGY REPORT*  Clinical Data: Infiltrates.  PORTABLE CHEST - 1 VIEW  Comparison:  Two-view chest 04/22/2012.  Findings: The heart size is exaggerated by low lung volumes.  The left lower lobe airspace disease is increasing.  Bilateral pleural effusions have increased, worse on the left. Right basilar airspace disease is similar to the prior exam.  IMPRESSION:  1.  Increasing left lower lobe airspace disease, compatible with pneumonia. 2.  And patchy areas of airspace disease persist compatible with pneumonia or edema. 3.  Increasing bilateral pleural effusions, worse on the left.   Original Report Authenticated By: Jamesetta Orleans. MATTERN, M.D.    Imaging: Imaging results have been reviewed  Medications: I have reviewed the patient's current medications.  Infusions:     . sodium chloride 20 mL/hr at 04/25/12 1200  . heparin 1,750 Units/hr (04/25/12 1200)    Assessment/Plan: Acute and chronic respiratory failure: She has improved in the lower requires BiPAP. Nonetheless, chest x-ray has worsened, likely representing progressive pneumonia.   TOBACCO USE: Patient continues to smoke. She does not appear convinced that she could stop.   HYPERTENSION: Blood pressure control has not been a problem during this admission.   Atrial fibrillation, paroxysmal: Heart rate is now rapid. Digoxin will be added to the patient's regime. She is obviously a suboptimal candidate for treatment with beta blocker.  Rx with intravenous heparin to prevent thromboembolism.   DIABETES MELLITUS, TYPE II   Acute diastolic CHF (congestive heart failure): Not entirely convincing the patient has congestive heart failure; however, BNP level has increased substantially. This may be a result of atrial fibrillation rather  than CHF, but we will continue to diuresis as tolerated.   COPD (chronic obstructive pulmonary disease): This is patient's most significant chronic medical condition. CCM is adjusting pulmonary medications.   LOS: 4 days   Grubbs Bing 04/25/2012, 4:02 PM

## 2012-04-25 NOTE — Progress Notes (Signed)
04/25/12 1319  Vitals  ECG Heart Rate ! 141    Cardiology Pa called.Scheduled po cardizem given now as ordered.Rounding MD will see pt. Pt asymptomatic. Will continue to monitor and advise attending as needed.

## 2012-04-26 LAB — BASIC METABOLIC PANEL
Calcium: 9.5 mg/dL (ref 8.4–10.5)
Creatinine, Ser: 0.76 mg/dL (ref 0.50–1.10)
GFR calc Af Amer: 90 mL/min — ABNORMAL LOW (ref >90)

## 2012-04-26 LAB — CBC
MCH: 29.8 pg (ref 26.0–34.0)
MCV: 93.1 fL (ref 78.0–100.0)
Platelets: 296 10*3/uL (ref 150–400)
RDW: 12.7 % (ref 11.5–15.5)

## 2012-04-26 LAB — GLUCOSE, CAPILLARY
Glucose-Capillary: 215 mg/dL — ABNORMAL HIGH (ref 70–99)
Glucose-Capillary: 126 mg/dL — ABNORMAL HIGH (ref 70–99)

## 2012-04-26 MED ORDER — GLYBURIDE 5 MG PO TABS
5.0000 mg | ORAL_TABLET | Freq: Every day | ORAL | Status: DC
  Administered 2012-04-26 – 2012-04-29 (×4): 5 mg via ORAL
  Filled 2012-04-26 (×5): qty 1

## 2012-04-26 MED ORDER — POTASSIUM CHLORIDE CRYS ER 20 MEQ PO TBCR
40.0000 meq | EXTENDED_RELEASE_TABLET | Freq: Three times a day (TID) | ORAL | Status: AC
  Administered 2012-04-26 – 2012-04-27 (×3): 40 meq via ORAL
  Filled 2012-04-26 (×2): qty 2

## 2012-04-26 MED ORDER — METFORMIN HCL 500 MG PO TABS
500.0000 mg | ORAL_TABLET | Freq: Two times a day (BID) | ORAL | Status: DC
  Administered 2012-04-26 – 2012-05-05 (×14): 500 mg via ORAL
  Filled 2012-04-26 (×23): qty 1

## 2012-04-26 MED ORDER — DILTIAZEM HCL 60 MG PO TABS
60.0000 mg | ORAL_TABLET | Freq: Three times a day (TID) | ORAL | Status: DC
  Administered 2012-04-26 – 2012-04-27 (×3): 60 mg via ORAL
  Filled 2012-04-26 (×6): qty 1

## 2012-04-26 NOTE — Progress Notes (Signed)
Spoke with pt. About wearing BIPAP & pt. Stated that "she didn't want to wear it". Pt. Is doing well at this time on 6L [place] & isn't in any respiratory distress at this time. Vital signs are as follows; HR: 97, RR: 21, BBS: Diminished, SAT: 93%.

## 2012-04-26 NOTE — Progress Notes (Signed)
[name]  78 y.o.  female  Subjective: Patient is mentally brighter. Dyspnea improved. Denies chest discomfort.   Allergy: Azithromycin; Codeine; Coumadin; and Oxybutynin chloride er  Objective: Vital signs in last 24 hours: Temp:  [97 F (36.1 C)-98.5 F (36.9 C)] 97.6 F (36.4 C) (10/27 1149) Pulse Rate:  [63-126] 105  (10/27 0342) Resp:  [24-33] 24  (10/27 0342) BP: (92-110)/(54-71) 104/66 mmHg (10/27 0336) SpO2:  [86 %-96 %] 92 % (10/27 0854) FiO2 (%):  [50 %] 50 % (10/27 0438) Weight:  [66.3 kg (146 lb 2.6 oz)] 66.3 kg (146 lb 2.6 oz) (10/27 0500)  66.3 kg (146 lb 2.6 oz) Body mass index is 23.59 kg/(m^2).  Weight change: -0.4 kg (-14.1 oz) Last BM Date: 04/23/12  Intake/Output from previous day: 10/26 0701 - 10/27 0700 In: 867.5 [P.O.:60; I.V.:657.5; IV Piggyback:150] Out: 2075 [Urine:2075] Total I&O: - 1.9 L; weight decreased 0.5 kg over the past 24 hours  General- Well developed; no acute distress Neck- No JVD, no carotid bruits Lungs-  inspiratory and expiratory rhonchi with expiratory wheeze ; prolonged expiratory phase; decreased breath sounds throughout; bronchial breath sounds at the left base. Cardiovascular- normal PMI; normal S1 and S2; rapid irregular rhythm Abdomen- normal bowel sounds; soft and non-tender without masses or organomegaly Skin- Warm, no significant lesions Extremities- decreased distal pulses; 1/2+ edema  CBC:    Basename 04/26/12 0540 04/25/12 0520  WBC 13.6* 11.0*  HGB 12.9 12.8  HCT 40.3 39.7  PLT 296 268   BMET:   Basename 04/26/12 0540 04/25/12 0520  NA 138 145  K 3.2* 3.9  CL 88* 94*  CO2 44* 44*  GLUCOSE 192* 175*  BUN 32* 34*  CREATININE 0.76 0.84  CALCIUM 9.5 9.8   BNP level: 6141; previously 338 in 11/2008  EKG:  Atrial fibrillation, rapid ventricular response, nonspecific ST-T wave abnormality.  Imaging Studies/Results: Dg Chest Port 1 View  04/25/2012  *RADIOLOGY REPORT*  Clinical Data: Infiltrates.   PORTABLE CHEST - 1 VIEW  Comparison: Two-view chest 04/22/2012.  Findings: The heart size is exaggerated by low lung volumes.  The left lower lobe airspace disease is increasing.  Bilateral pleural effusions have increased, worse on the left. Right basilar airspace disease is similar to the prior exam.  IMPRESSION:  1.  Increasing left lower lobe airspace disease, compatible with pneumonia. 2.  And patchy areas of airspace disease persist compatible with pneumonia or edema. 3.  Increasing bilateral pleural effusions, worse on the left.   Original Report Authenticated By: Jamesetta Orleans. MATTERN, M.D.    Imaging: Imaging results have been reviewed  Medications: I have reviewed the patient's current medications.  Infusions:   . heparin 1,750 Units/hr (04/26/12 0800)    Assessment/Plan: Acute and chronic respiratory failure: Oxygen requirements continued to improve, as does the patient clinically, with treatment of pneumonia.   TOBACCO USE: Patient continues to smoke. She does not appear convinced that she could stop.   HYPERTENSION:  blood pressure remained stable and well controlled.  Diabetes mellitus: A1c 7.6 on admission; CBGs consistently greater than 150; will adjust therapy by adding metformin, resuming glyburide and discontinuing long-acting insulin.   Atrial fibrillation, paroxysmal:  Heart rate decreased with improvement in pneumonia and treatment with digoxin; systolic blood pressure remains adequate; dose of diltiazem will be increased.   Acute diastolic CHF (congestive heart failure): Continues to diuresis without impairment in renal function; BNP level to be repeated.  Hypokalemia: Replacement ordered.  COPD (chronic obstructive pulmonary  disease): This is patient's most significant chronic medical condition. CCM is adjusting pulmonary medications.   LOS: 5 days   [place] Bing 04/26/2012, 12:43 PM

## 2012-04-26 NOTE — Progress Notes (Signed)
ANTICOAGULATION CONSULT NOTE - Follow Up Consult  Pharmacy Consult for Heparin Indication: atrial fibrillation  Allergies  Allergen Reactions  . Azithromycin     REACTION: inc blood sugar  . Codeine Itching  . Coumadin (Warfarin) Other (See Comments)    Causes bleeding  . Oxybutynin Chloride Er     Constipation/ dizziness/ dry mouth    Patient Measurements: Height: 5\' 6"  (167.6 cm) Weight: 146 lb 2.6 oz (66.3 kg) IBW/kg (Calculated) : 59.3  Heparin Dosing Weight: 66kg  Vital Signs: Temp: 98.5 F (36.9 C) (10/27 0757) Temp src: Oral (10/27 0757) BP: 104/66 mmHg (10/27 0336) Pulse Rate: 105  (10/27 0342)  Labs:  Basename 04/26/12 0540 04/25/12 1330 04/25/12 0520 04/24/12 0440  HGB 12.9 -- 12.8 --  HCT 40.3 -- 39.7 --  PLT 296 -- 268 --  APTT -- -- -- --  LABPROT -- -- -- --  INR -- -- -- --  HEPARINUNFRC 0.42 0.36 <0.10* --  CREATININE 0.76 -- 0.84 0.87  CKTOTAL -- -- -- --  CKMB -- -- -- --  TROPONINI -- -- -- --    Estimated Creatinine Clearance: 52.5 ml/min (by C-G formula based on Cr of 0.76).  Medications:  Heparin @ 1750 units/hr  Assessment: 82yof continues on heparin for afib with a therapeutic heparin level. CBC is stable. No bleeding noted.  Goal of Therapy:  Heparin level 0.3-0.7 units/ml Monitor platelets by anticoagulation protocol: Yes   Plan:  1) Continue heparin at 1750 units/hr 2) Follow up heparin level and CBC in AM  [name] 04/26/2012,8:50 AM

## 2012-04-27 LAB — BASIC METABOLIC PANEL
CO2: 42 mEq/L (ref 19–32)
CO2: 43 mEq/L (ref 19–32)
Calcium: 9.3 mg/dL (ref 8.4–10.5)
Chloride: 91 mEq/L — ABNORMAL LOW (ref 96–112)
Chloride: 89 mEq/L — ABNORMAL LOW (ref 96–112)
Glucose, Bld: 150 mg/dL — ABNORMAL HIGH (ref 70–99)
Potassium: 3.7 mEq/L (ref 3.5–5.1)
Sodium: 140 mEq/L (ref 135–145)
Sodium: 137 mEq/L (ref 135–145)

## 2012-04-27 LAB — GLUCOSE, CAPILLARY
Glucose-Capillary: 278 mg/dL — ABNORMAL HIGH (ref 70–99)
Glucose-Capillary: 82 mg/dL (ref 70–99)
Glucose-Capillary: 141 mg/dL — ABNORMAL HIGH (ref 70–99)

## 2012-04-27 LAB — CBC
Hemoglobin: 13 g/dL (ref 12.0–15.0)
MCH: 30.5 pg (ref 26.0–34.0)
RBC: 4.26 MIL/uL (ref 3.87–5.11)
WBC: 12.5 10*3/uL — ABNORMAL HIGH (ref 4.0–10.5)

## 2012-04-27 MED ORDER — DIGOXIN 125 MCG PO TABS
0.1250 mg | ORAL_TABLET | Freq: Every day | ORAL | Status: DC
  Administered 2012-04-27 – 2012-05-05 (×9): 0.125 mg via ORAL
  Filled 2012-04-27 (×10): qty 1

## 2012-04-27 MED ORDER — DILTIAZEM HCL ER COATED BEADS 240 MG PO CP24
240.0000 mg | ORAL_CAPSULE | Freq: Every day | ORAL | Status: DC
  Administered 2012-04-27 – 2012-05-05 (×9): 240 mg via ORAL
  Filled 2012-04-27 (×10): qty 1

## 2012-04-27 MED ORDER — HEPARIN (PORCINE) IN NACL 100-0.45 UNIT/ML-% IJ SOLN
1300.0000 [IU]/h | INTRAMUSCULAR | Status: DC
  Administered 2012-04-27: 1950 [IU]/h via INTRAVENOUS
  Administered 2012-04-28 (×2): 1600 [IU]/h via INTRAVENOUS
  Administered 2012-04-29 – 2012-04-30 (×3): 1450 [IU]/h via INTRAVENOUS
  Administered 2012-05-03 – 2012-05-04 (×3): 1300 [IU]/h via INTRAVENOUS
  Filled 2012-04-27 (×17): qty 250

## 2012-04-27 MED ORDER — HEPARIN BOLUS VIA INFUSION
2000.0000 [IU] | Freq: Once | INTRAVENOUS | Status: AC
  Administered 2012-04-27: 2000 [IU] via INTRAVENOUS
  Filled 2012-04-27: qty 2000

## 2012-04-27 MED ORDER — POTASSIUM CHLORIDE CRYS ER 20 MEQ PO TBCR: 40.0000 meq | EXTENDED_RELEASE_TABLET | Freq: Once | ORAL | Status: AC

## 2012-04-27 MED ORDER — POTASSIUM CHLORIDE 20 MEQ PO PACK
40.0000 meq | PACK | Freq: Once | ORAL | Status: DC
  Filled 2012-04-27: qty 2

## 2012-04-27 MED ORDER — DILTIAZEM HCL ER COATED BEADS 180 MG PO CP24
180.0000 mg | ORAL_CAPSULE | Freq: Every day | ORAL | Status: DC
  Filled 2012-04-27: qty 1

## 2012-04-27 NOTE — Progress Notes (Signed)
Physical Therapy Treatment Patient Details Name: [name] MRN: [id number] DOB: [birth year] Today's Date: 04/27/2012 Time: 7829-5621 PT Time Calculation (min): 31 min  PT Assessment / Plan / Recommendation Comments on Treatment Session  Pt. reluctant to try and stand, but as the PTA encouraged her, she began to attempt to stand. Pt. would sit without warning and without reaching for the surface she would be sitting on. Pt. encouraged to reach for surface and to slowly lower herself without "flopping". Pt. needed cues to stand erect and to hold her head up with trying to ambulate with RW and vc's to keep her hands in the correct place on the RW. Pt. needed max vc's to shift her wt forward throughout sit->stand and ambulation.     Follow Up Recommendations  Home health PT;Supervision - Intermittent     Does the patient have the potential to tolerate intense rehabilitation     Barriers to Discharge        Equipment Recommendations  None recommended by OT    Recommendations for Other Services    Frequency Min 3X/week   Plan      Precautions / Restrictions Precautions Precautions: Fall Restrictions Weight Bearing Restrictions: No   Pertinent Vitals/Pain Pt had O2 sats at  93% 5L at rest. With sit<>stand and ambulation pt. desated to 87% on 6L of supplemental O2. Pt. Returned to low-mid 90s upon rest.    Mobility  Bed Mobility Bed Mobility: Not assessed Supine to Sit: 4: Min guard;HOB elevated Sitting - Scoot to Edge of Bed: 5: Supervision Details for Bed Mobility Assistance: bed mobility was not assessed as pt. was sitting in recliner in her room. Transfers Transfers: Sit to Stand;Stand to Sit Sit to Stand: 3: Mod assist Stand to Sit: 2: Max assist Details for Transfer Assistance: Pt. would rock back on her heels and need cuing for shifting her wt. forward when upright. Upon sitting, pt. would sit unexpectedly without reaching for sitting surface and without telling  therapist. Pt. very unsteady when standing and leaning heavily on heels.  Marching in standing difficult since patient was pushing backward. Ambulation/Gait Ambulation/Gait Assistance: 3: Mod assist (+2 for safety to follow with recliner) Ambulation Distance (Feet): 10 Feet Assistive device: Rolling walker Ambulation/Gait Assistance Details: +2 (A) fo safety, second person following with recliner.  (A) for balance, advancement of RW, & safety.   Pt. would sit without reaching for sitting surface and not warn therapist. Per pt - she was eager to walk, but thought her weakness would keep her from it. Pt. mentioned that it would be easier to ambulate in her shoes, but no shoes were found in the room- "They are at the front door of [facility]" Gait Pattern: Decreased stride length;Shuffle;Narrow base of support; Knees flexed in stance phase Gait velocity: Slow General Gait Details: Pt. needed max vc's to shift her wt. forward and to not lean back so much. Pt. encouraged by setting a distance goal (ex. the blue tile) Stairs: No Wheelchair Mobility Wheelchair Mobility: No    Exercises General Exercises - Lower Extremity Ankle Circles/Pumps: AROM;Both;10 reps;Seated Long Arc Quad: AROM;Both;10 reps;Seated Hip Flexion/Marching: AROM;10 reps;Standing;Both    PT Goals Acute Rehab PT Goals PT Goal Formulation: With patient Time For Goal Achievement: 05/09/12 Potential to Achieve Goals: Good Pt will go Supine/Side to Sit: with modified independence Pt will go Sit to Supine/Side: with modified independence Pt will go Sit to Stand: with modified independence PT Goal: Sit to Stand - Progress: Progressing toward  goal Pt will go Stand to Sit: with modified independence PT Goal: Stand to Sit - Progress: Progressing toward goal Pt will Ambulate: >150 feet;with modified independence PT Goal: Ambulate - Progress: Progressing toward goal Pt will Go Up / Down Stairs: 3-5 stairs;with rail(s);with modified  independence Pt will Perform Home Exercise Program: Independently  Visit Information  Last PT Received On: 04/27/12 Assistance Needed: +2 (For ambulation)    Subjective Data  Subjective: "Oh, I'm going to do this!"   Cognition  Overall Cognitive Status: Impaired Area of Impairment: Safety/judgement;Awareness of deficits;Following commands;Attention Arousal/Alertness: Awake/alert (Easily distractable) Orientation Level: Appears intact for tasks assessed Behavior During Session: Anxious (Increased anxiety, fearful of falling) Awareness of Deficits: Pt stated she could do everything Ily at home and she just needed to go  home and she would be able to do everything Ily again.  Pt unaware of just how dependent she is on someone for her mobilty right now. Cognition - Other Comments: Patient answers orientation questions appropriately, but appears confused throughout session with statements of shoes left at front door of [place] and from the 1940[place].     Balance  Balance Balance Assessed: No Static Standing Balance Static Standing - Balance Support: Bilateral upper extremity supported Static Standing - Level of Assistance: 3: Mod assist Static Standing - Comment/# of Minutes: 3  End of Session PT - End of Session Equipment Utilized During Treatment: Gait belt;Oxygen (O2 at 6L) Activity Tolerance: Patient limited by fatigue Patient left: in chair;with call bell/phone within reach Nurse Communication: Mobility status (Cognition status)    [name] , SPTA 04/27/2012, 3:08 PM  [name], PTA [phone] 04/27/2012

## 2012-04-27 NOTE — Progress Notes (Signed)
Patient Name: [name] Date of Encounter: 04/27/2012  Principal Problem:  *Acute and chronic respiratory failure Active Problems:  HYPERCHOLESTEROLEMIA  TOBACCO USE  HYPERTENSION  Atrial fibrillation  DIABETES MELLITUS, TYPE II  Dementia  Acute diastolic CHF (congestive heart failure)  COPD (chronic obstructive pulmonary disease)    SUBJECTIVE: Pt breathing better, feels close to baseline but is not on O2 at home.  OBJECTIVE Filed Vitals:   04/27/12 0344 04/27/12 0401 04/27/12 0619 04/27/12 0750  BP:      Pulse: 100     Temp:  97.7 F (36.5 C)    TempSrc:  Oral    Resp: 25     Height:      Weight:   145 lb 11.6 oz (66.1 kg)   SpO2: 96%   92%    Intake/Output Summary (Last 24 hours) at 04/27/12 0755 Last data filed at 04/27/12 0600  Gross per 24 hour  Intake    495 ml  Output   1800 ml  Net  -1305 ml   Filed Weights   04/25/12 0500 04/26/12 0500 04/27/12 0619  Weight: 147 lb 0.8 oz (66.7 kg) 146 lb 2.6 oz (66.3 kg) 145 lb 11.6 oz (66.1 kg)   PHYSICAL EXAM General: Well developed, well nourished, slender female in no acute distress. Head: Normocephalic, atraumatic.  Neck: Supple without bruits, JVD difficult, possibly mildly elevated Lungs:  Resp regular and unlabored, rales and some rhonchi, bronchial BS left base. Heart: RRR, S1, S2, no S3, S4, or murmur. Abdomen: Soft, non-tender, non-distended, BS + x 4.  Extremities: No clubbing, cyanosis, no edema.  Neuro: Alert and oriented X 3. Moves all extremities spontaneously. Psych: Normal affect.  LABS: CBC: Basename 04/27/12 0615 04/26/12 0540  WBC 12.5* 13.6*  NEUTROABS -- --  HGB 13.0 12.9  HCT 39.2 40.3  MCV 92.0 93.1  PLT 279 296   Basic Metabolic Panel: Basename 04/27/12 0615 04/26/12 0540  NA 140 138  K 3.6 3.2*  CL 91* 88*  CO2 42* 44*  GLUCOSE 150* 192*  BUN 29* 32*  CREATININE 0.78 0.76  CALCIUM 9.3 9.5  MG -- --  PHOS -- --   BNP: Pro B Natriuretic peptide (BNP)    Date/Time Value Range Status  04/27/2012  6:15 AM 1311.0* 0 - 450 pg/mL Final  04/21/2012  1:13 PM 6141.0* 0 - 450 pg/mL Final   Lab Results  Component Value Date   CHOL 129 04/22/2012   HDL 51 04/22/2012   LDLCALC 54 04/22/2012   TRIG 121 04/22/2012   CHOLHDL 2.5 04/22/2012    TELE:  Afib, RVR at times    Radiology/Studies: Dg Chest 2 View 04/22/2012  *RADIOLOGY REPORT*  Clinical Data: Follow up possible pneumonia  CHEST - 2 VIEW  Comparison: 04/21/2012  Findings: Multifocal patchy airspace opacities, stable versus mildly increased, possibly reflecting multifocal pneumonia or asymmetric interstitial edema.  Small left pleural effusion.  No pneumothorax.  The heart is top normal in size.  Degenerative changes of the visualized thoracolumbar spine.  IMPRESSION: Multifocal patchy airspace opacities, stable versus mildly increased, possibly reflecting multifocal pneumonia or asymmetric interstitial edema.  Small left pleural effusion.   Original Report Authenticated By: Charline Bills, M.D.    Dg Chest Port 1 View 04/25/2012  *RADIOLOGY REPORT*  Clinical Data: Infiltrates.  PORTABLE CHEST - 1 VIEW  Comparison: Two-view chest 04/22/2012.  Findings: The heart size is exaggerated by low lung volumes.  The left lower lobe airspace disease is increasing.  Bilateral pleural effusions have increased, worse on the left. Right basilar airspace disease is similar to the prior exam.  IMPRESSION:  1.  Increasing left lower lobe airspace disease, compatible with pneumonia. 2.  And patchy areas of airspace disease persist compatible with pneumonia or edema. 3.  Increasing bilateral pleural effusions, worse on the left.   Original Report Authenticated By: Jamesetta Orleans. MATTERN, M.D.    Dg Swallowing [facility] 04/22/2012  [name], CCC-SLP     04/22/2012  2:51 PM Objective Swallowing Evaluation: Modified Barium Swallowing Study   Patient Details  Name: [name] MRN: [id number] Date  of Birth: 12-Feb-1930  Today's Date: 04/22/2012 Time: 1420-1440 SLP Time Calculation (min): 20 min  Past Medical History:  Past Medical History  Diagnosis Date  . Allergy     allergic Rhintis  . Hyperlipidemia   . Hypertension   . Osteoporosis   . PAF (paroxysmal atrial fibrillation)     a. dates back > 5 yrs, prev on coumadin->d/c 2/2 GIB;  b.  12/2011 Echo:  EF 60-65%, nl wall motion Triv MR/TR.  [name] Carotid stenosis     a. 09/2007: Carotid Doppler stable- stable bilateral stenosis  40-59% (from 06)  . Type II diabetes mellitus   . Tobacco abuse     a. ongoing - 50+ pack yr hx.  . Dementia     Dementia in Hospital //Does O.K. at home.  . Diverticulosis   . GI bleeding     a. when on coumadin in 2006 - colonoscopy @ that time showed 2  large right colon avm's, multiple small bowel avm's, fresh blood  in prox small bowel.  . AVM (arteriovenous malformation) of colon     a. 2006 colonscopy   Past Surgical History:  Past Surgical History  Procedure Date  . Cardiac catheterization 1990's    clear  . Orif tibia & fibula fractures 05/2004  . Small bowel enteroscopy 03/2005    AVMS   HPI:  [name] is an 78 yo with hx of PAF, HTN, COPD ( ongoing  cigarette smoking) admitted with a COPD exacerbation and rapid  atrial fibrillation. Diagnosed with probably bilateral PNA.  Patient reports that she has had PNa frequently since esophagus  was dilated a few years ago.      Assessment / Plan / Recommendation Clinical Impression  Clinical impression: Patient presents with a functional  oropharyngeal swallow which is largely [facility] with the exception of  intermittent trace pharyngeal residuals noted post swallow (? due  to generalized weakness) which clear with spontaneous dry  swallows. No aspiration or penetration observed. Recommend  continuation of a regular diet, thin liquids with general safe  swallowing and reflux precautions given h/o GERD, esophageal  stenosis. Per RN, son concerned about patient's general decline  for solid  foods prior to admission. Although no significant  backflow of bolus into the pharynx noted during today's exam, f/u  esophageal w/u may be beneficial if recurrent stricture or  additional esophageal dysfunction remains a concern. SLP will f/u  briefly at bedside for diet tolerance and education.     Treatment Recommendation  Therapy as outlined in treatment plan below    Diet Recommendation Regular;Thin liquid   Liquid Administration via: Cup;Straw Medication Administration: Whole meds with liquid Supervision: Patient able to self feed;Intermittent supervision  to cue for compensatory strategies Compensations: Slow rate;Small sips/bites Postural Changes and/or Swallow Maneuvers: Seated upright 90  degrees;Upright 30-60 min after meal  Other  Recommendations Oral Care Recommendations: Oral care BID   Follow Up Recommendations  None    Frequency and Duration min 2x/week  2 weeks   Pertinent Vitals/Pain n/a    SLP Swallow Goals Patient will utilize recommended strategies during swallow to  increase swallowing safety with: Supervision/safety Swallow Study Goal #2 - Progress: Not met   Current Medications:     . antiseptic oral rinse  15 mL Mouth Rinse BID  . aspirin  81 mg Oral Daily  . atorvastatin  10 mg Oral q1800  . budesonide  0.25 mg Nebulization BID  . calcium carbonate  1 tablet Oral BID WC  . cefTAZidime (FORTAZ)  IV  1 g Intravenous Q8H  . cholecalciferol  2,000 Units Oral Daily  . digoxin  0.25 mg Intravenous BID  . digoxin  0.25 mg Oral Daily  . diltiazem  60 mg Oral Q8H  . furosemide  40 mg Intravenous TID  . glyBURIDE  5 mg Oral Daily  . insulin aspart  0-15 Units Subcutaneous TID WC  . insulin aspart  0-5 Units Subcutaneous QHS  . insulin aspart  3 Units Subcutaneous TID WC  . ipratropium  0.5 mg Nebulization QID  . levalbuterol  0.63 mg Nebulization Q4H  . metFORMIN  500 mg Oral BID AC  . multivitamin with minerals  1 tablet Oral Daily  . nicotine  21 mg Transdermal Daily  .  potassium chloride  40 mEq Oral TID  . DISCONTD: diltiazem  30 mg Oral Q8H  . DISCONTD: insulin glargine  10 Units Subcutaneous QHS      . sodium chloride 10 mL (04/26/12 1900)  . heparin 1,750 Units/hr (04/27/12 0010)    ASSESSMENT AND PLAN: Principal Problem:  *Acute and chronic respiratory failure/COPD/CHF - Pulm last saw 10/25, CXR 10/26 with worsening PNA, Hx of acinitobacter PNA 2010. Will re-contact to ask them to see again. BNP is improved, continue IV Lasix at current dose for now, recheck BMET in am.  Active Problems:  HYPERCHOLESTEROLEMIA - lipid profile OK on current Rx   TOBACCO USE - cessation encouraged, pt does not appear convinced that she could stop   HYPERTENSION - good control on current Rx   Atrial fibrillation - rate control generally good, change dilt to 180 mg CD daily   DIABETES MELLITUS, TYPE II - SSI, oral Rx, DM diet   Dementia - follow   Acute diastolic CHF (congestive heart failure) - see above  Plan - CCM to see again, increase activity as pt will tolerate.  Signed, Theodore Demark , PA-C 7:55 AM 04/27/2012  Patient seen with PA, agree with above note.  1. Pulmonary: Still requiring oxygen.  Patient has likely combined picture with acute on chronic diastolic CHF, possible LLL PNA, and COPD.  She is getting IV Lasix, nebs, and antibiotics.  She has a significant oxygen requirement, and I am not convinced at this point that pulmonary edema is the primary player now.  Weight is down and she has diuresed.  I wonder if PNA versus COPD exacerbation is not the more important issue now, though PCT level was not significantly elevated.   - Will have CCM see her again today.  - Continue nebs, antibiotics.  - Will continue current IV Lasix for diuresis, has had good response.  2. Atrial fibrillation: Rate still running a bit high.  I will increase diltiazem CD to 240 mg daily.  I do not want her on so much digoxin so will  decrease dose to 0.125. She is on  heparin gtt.  Not a good long-term coumadin candidate (GI AVMs, h/o GI bleed).  3. Acute on chronic diastolic CHF: Difficult exam for volume but she does not appear more than mildly volume overloaded.  Creatinine is stable.  I will continue IV Lasix given ongoing dyspnea/oxygen requirement.   Marca Ancona 04/27/2012 9:15 AM

## 2012-04-27 NOTE — Progress Notes (Signed)
Inpatient Diabetes Program Recommendations  AACE/ADA: New Consensus Statement on Inpatient Glycemic Control (2013)  Target Ranges:  Prepandial:   less than 140 mg/dL      Peak postprandial:   less than 180 mg/dL (1-2 hours)      Critically ill patients:  140 - 180 mg/dL   Reason for Visit: Results for CARIGAN, BASSET (MRN [id number]) as of 04/27/2012 12:27  Ref. Range 04/27/2012 08:10 04/27/2012 12:12  Glucose-Capillary Latest Range: 70-99 mg/dL 409 (H) 811 (H)   Please consider restarting Lantus 10 units daily.  May also consider increasing Novolog meal coverage to 4 units tid with meals.     Note: Will follow.

## 2012-04-27 NOTE — Evaluation (Signed)
Occupational Therapy Evaluation Patient Details Name: [name] MRN: [id number] DOB: June 18, 1930 Today's Date: 04/27/2012 Time: 4098-1191 OT Time Calculation (min): 22 min  OT Assessment / Plan / Recommendation Clinical Impression  Pt admitted for coughing, SOB after being found on floor by her son with the deficits listed below.  Pt would benefit from cont OT to increase I in adls and safety with all adl mobility before returning home.    OT Assessment  Patient needs continued OT Services    Follow Up Recommendations  Skilled nursing facility    Barriers to Discharge Decreased caregiver support Pt states she has nobody that can stay with her after d/c.  Pt really needs 24/7 assist at this point.  Equipment Recommendations  None recommended by OT    Recommendations for Other Services    Frequency  Min 2X/week    Precautions / Restrictions Precautions Precautions: Fall Restrictions Weight Bearing Restrictions: No   Pertinent Vitals/Pain Pt c/o no pain.  Pt's O2 sats at 86% with any mobility at all.  Pt  Returned to 90% at rest.  HR up to 124.  100 at rest.    ADL  Eating/Feeding: Performed;Set up Where Assessed - Eating/Feeding: Chair Grooming: Performed;Wash/dry face;Brushing hair;Set up Where Assessed - Grooming: Supported sitting Upper Body Bathing: Simulated;Set up Where Assessed - Upper Body Bathing: Supported sitting Lower Body Bathing: Simulated;Maximal assistance Where Assessed - Lower Body Bathing: Supported sit to stand Upper Body Dressing: Simulated;Minimal assistance Where Assessed - Upper Body Dressing: Supported sitting Lower Body Dressing: Performed;Maximal assistance Where Assessed - Lower Body Dressing: Supported sit to Pharmacist, hospital: Performed;Moderate assistance Toilet Transfer Method: Stand pivot Acupuncturist: Bedside commode Toileting - Clothing Manipulation and Hygiene: Performed;Minimal assistance Where Assessed -  Toileting Clothing Manipulation and Hygiene: Sit on 3-in-1 or toilet Transfers/Ambulation Related to ADLs: Pt very unsteady during transfers with a posterior lean when up. ADL Comments: Pt requires more assist with LE adls.  Could not do too many adls.  Pts O2 sats kept dropping down to 86 on 6L of O2 during adls.    OT Diagnosis: Generalized weakness;Cognitive deficits  OT Problem List: Decreased activity tolerance;Impaired balance (sitting and/or standing);Decreased cognition;Decreased safety awareness;Decreased knowledge of use of DME or AE OT Treatment Interventions: Self-care/ADL training;Therapeutic activities   OT Goals Acute Rehab OT Goals OT Goal Formulation: With patient Time For Goal Achievement: 05/11/12 Potential to Achieve Goals: Good ADL Goals Pt Will Perform Grooming: with min assist;Standing at sink ADL Goal: Grooming - Progress: Goal set today Pt Will Perform Lower Body Bathing: with min assist;Standing at sink;Sitting at sink;Sit to stand from chair ADL Goal: Lower Body Bathing - Progress: Goal set today Pt Will Perform Lower Body Dressing: with min assist;Sit to stand from chair ADL Goal: Lower Body Dressing - Progress: Goal set today Pt Will Perform Tub/Shower Transfer: Shower transfer;with min assist;Shower seat with back ADL Goal: Tub/Shower Transfer - Progress: Goal set today Additional ADL Goal #1: Pt will complete all aspects of toileting on comfort heigh commode with s. ADL Goal: Additional Goal #1 - Progress: Goal set today  Visit Information  Last OT Received On: 04/27/12 Assistance Needed: +1    Subjective Data  Subjective: "I live alone and I drive." Patient Stated Goal: to go home.   Prior Functioning     Home Living Lives With: Alone Available Help at Discharge: Family;Available PRN/intermittently Type of Home: House Home Access: Stairs to enter Entrance Stairs-Number of Steps: 3 Entrance Stairs-Rails: Right  Home Layout: One level Bathroom  Shower/Tub: Health visitor: Handicapped height Home Adaptive Equipment: Shower chair with back Additional Comments: pt's son and daughter-in-law live in [place], she seems doubtful that they could stay with her for awhile when she goes home and she says she cannot stay with them because they have 5 dogs Prior Function Level of Independence: Independent Able to Take Stairs?: Yes Driving: Yes Vocation: Retired Musician: No difficulties Dominant Hand: Right         Vision/Perception Vision - Assessment Vision Assessment: Vision not tested   Cognition  Overall Cognitive Status: Impaired Area of Impairment: Awareness of deficits Arousal/Alertness: Awake/alert Orientation Level: Disoriented to;Time (was off just by 4 days) Behavior During Session: [facility] for tasks performed Awareness of Deficits: Pt stated she could do everything Ily at home and she just needed to go  home and she would be able to do everything Ily again.  Pt unaware of just how dependent she is on someone for her mobilty right now. Cognition - Other Comments: Pt with some insight issues.  Overall she is oriented but just seems to think that returning home will "make it all better."    Extremity/Trunk Assessment Right Upper Extremity Assessment RUE ROM/Strength/Tone: Within functional levels RUE Sensation: WFL - Light Touch RUE Coordination: WFL - gross/fine motor Left Upper Extremity Assessment LUE ROM/Strength/Tone: Within functional levels LUE Sensation: WFL - Light Touch LUE Coordination: WFL - gross/fine motor Trunk Assessment Trunk Assessment: Normal     Mobility Bed Mobility Bed Mobility: Supine to Sit;Sitting - Scoot to Edge of Bed Supine to Sit: 4: Min guard;HOB elevated Sitting - Scoot to [place] of Bed: 5: Supervision Details for Bed Mobility Assistance: pt able to sit straight up in bed but takes increased time to do so.  Scoots to EOB but had to be cued verbally  several times to realize she needed to do this to be able to stand Transfers Transfers: Sit to Stand;Stand to Sit Sit to Stand: 3: Mod assist;From bed Stand to Sit: 3: Mod assist;With armrests;To chair/3-in-1 Details for Transfer Assistance: pt unsteady with standing, thus given +2 assist for safety, however, pt with sufficient strength for standing and able to wt-shift appropriately to perform mvmt     Shoulder Instructions     Exercise     Balance Balance Balance Assessed: Yes Static Standing Balance Static Standing - Balance Support: Bilateral upper extremity supported Static Standing - Level of Assistance: 3: Mod assist Static Standing - Comment/# of Minutes: 3   End of Session OT - End of Session Activity Tolerance: Patient tolerated treatment well;Treatment limited secondary to medical complications (Comment);Other (comment) (pts o2 stats kept dropping below 88%) Patient left: in chair;with call bell/phone within reach Nurse Communication: Mobility status  GO     Hope Budds 04/27/2012, 11:58 AM [phone]

## 2012-04-27 NOTE — Progress Notes (Signed)
Speech Language Pathology Dysphagia Treatment Patient Details Name: [name] MRN: [id number] DOB: Aug 17, 1929 Today's Date: 04/27/2012 Time: 8469-6295 SLP Time Calculation (min): 18 min  Assessment / Plan / Recommendation Clinical Impression  Pt back on nasal cannula, has been off BiPAP today. Pt alert but confused. SLP provided trials of thin liquids via cup and straw with no overt signs of aspiration though pt was observed to have an audible swallow indicative of possible delay in initiation. Pt also seemed to have a gasp of air after larger swallow. Pt continues to be at risk of silent aspiration due to respiratory impairment. SLP reinforced aspiration precautions iwth pt and RN, pt return demonstrated with moderate verbal cues. SLP will continue to follow for tolerance.     Diet Recommendation  Continue with Current Diet: Regular;Thin liquid    SLP Plan Continue with current plan of care   Pertinent Vitals/Pain NA   Swallowing Goals  SLP Swallowing Goals Patient will consume recommended diet without observed clinical signs of aspiration with: Minimal assistance Swallow Study Goal #1 - Progress: Progressing toward goal Patient will utilize recommended strategies during swallow to increase swallowing safety with: Minimal cueing Swallow Study Goal #2 - Progress: Progressing toward goal  General Temperature Spikes Noted: No Respiratory Status: Supplemental O2 delivered via (comment) (nasal cannula) Behavior/Cognition: Confused;Alert;Cooperative Oral Cavity - Dentition: Missing dentition Patient Positioning: Upright in bed  Oral Cavity - Oral Hygiene Does patient have any of the following "at risk" factors?: Oxygen therapy - cannula, mask, simple oxygen devices Patient is HIGH RISK - Oral Care Protocol followed (see row info): Yes   Dysphagia Treatment Treatment focused on: Skilled observation of diet tolerance;Utilization of compensatory strategies Patient observed directly  with PO's: Yes Type of PO's observed: Thin liquids Feeding: Total assist Liquids provided via: Cup;Straw Pharyngeal Phase Signs & Symptoms: Audible swallow Type of cueing: Verbal Amount of cueing: Moderate   GO     [name], [name] 04/27/2012, 11:16 AM

## 2012-04-27 NOTE — Progress Notes (Signed)
ANTICOAGULATION CONSULT NOTE - Follow Up Consult  Pharmacy Consult for Heparin Indication: atrial fibrillation  Allergies  Allergen Reactions  . Azithromycin     REACTION: inc blood sugar  . Codeine Itching  . Coumadin (Warfarin) Other (See Comments)    Causes bleeding  . Oxybutynin Chloride Er     Constipation/ dizziness/ dry mouth    Patient Measurements: Height: 5\' 6"  (167.6 cm) Weight: 145 lb 11.6 oz (66.1 kg) IBW/kg (Calculated) : 59.3  Heparin Dosing Weight: 66kg  Vital Signs: Temp: 99.2 F (37.3 C) (10/28 1600) Temp src: Oral (10/28 1600) BP: 115/49 mmHg (10/28 1600) Pulse Rate: 97  (10/28 1600)  Labs:  Basename 04/27/12 1652 04/27/12 1033 04/27/12 0615 04/26/12 0540 04/25/12 0520  HGB -- -- 13.0 12.9 --  HCT -- -- 39.2 40.3 39.7  PLT -- -- 279 296 268  APTT -- -- -- -- --  LABPROT -- -- -- -- --  INR -- -- -- -- --  HEPARINUNFRC 1.05* -- <0.10* 0.42 --  CREATININE -- 0.76 0.78 0.76 --  CKTOTAL -- -- -- -- --  CKMB -- -- -- -- --  TROPONINI -- -- -- -- --    Estimated Creatinine Clearance: 52.5 ml/min (by C-G formula based on Cr of 0.76).   Medications:  Heparin @ 1950 units/hr  Assessment: 82yof continues on heparin for afib. Heparin level was undetectable this morning. Heparin level now 1.05 after bolus and rate increase. Spoke with RN and it appears lab was drawn appropriately. I suspect heparin level this am was incorrect.  Goal of Therapy:  Heparin level 0.3-0.7 units/ml Monitor platelets by anticoagulation protocol: Yes   Plan:  Reduce heparin to original 1750 units/hr and recheck heparin level in 6hrs  [place], [employer] 04/27/2012,5:38 PM

## 2012-04-27 NOTE — Progress Notes (Signed)
ANTICOAGULATION CONSULT NOTE - Follow Up Consult  Pharmacy Consult for Heparin Indication: atrial fibrillation  Allergies  Allergen Reactions  . Azithromycin     REACTION: inc blood sugar  . Codeine Itching  . Coumadin (Warfarin) Other (See Comments)    Causes bleeding  . Oxybutynin Chloride Er     Constipation/ dizziness/ dry mouth    Patient Measurements: Height: 5\' 6"  (167.6 cm) Weight: 145 lb 11.6 oz (66.1 kg) IBW/kg (Calculated) : 59.3  Heparin Dosing Weight: 66kg  Vital Signs: Temp: 98.5 F (36.9 C) (10/28 0800) Temp src: Oral (10/28 0800) BP: 118/55 mmHg (10/28 0800) Pulse Rate: 102  (10/28 0800)  Labs:  Basename 04/27/12 0615 04/26/12 0540 04/25/12 1330 04/25/12 0520  HGB 13.0 12.9 -- --  HCT 39.2 40.3 -- 39.7  PLT 279 296 -- 268  APTT -- -- -- --  LABPROT -- -- -- --  INR -- -- -- --  HEPARINUNFRC <0.10* 0.42 0.36 --  CREATININE 0.78 0.76 -- 0.84  CKTOTAL -- -- -- --  CKMB -- -- -- --  TROPONINI -- -- -- --    Estimated Creatinine Clearance: 52.5 ml/min (by C-G formula based on Cr of 0.78).   Medications:  Heparin @ 1750 units/hr  Assessment: [name] continues on heparin for afib. Heparin level is undetectable this morning. Unclear why as infusion running ok and no interruption in infusion overnight per RN. She has been therapeutic on 1750 units/hr for 4 days.  No bleeding noted. CBC stable.  Goal of Therapy:  Heparin level 0.3-0.7 units/ml Monitor platelets by anticoagulation protocol: Yes   Plan:  1) Heparin bolus 2000 units x 1 2) Increase heparin drip to 1950 units/hr 3) 8h heparin level  [name] 04/27/2012,8:31 AM

## 2012-04-27 NOTE — Progress Notes (Signed)
Name: [name] MRN: [id number] DOB: December 22, 1929    LOS: 6 Date of admit 04/21/2012   Referring Provider:  Dr Antoine Poche cards Reason for Referral:  Acute Respiratory failure  PULMONARY / CRITICAL CARE MEDICINE  HPI:  78 year old COPD NOS, mild dementia per chart (cognitively with capacity on 04/23/12), AVM colon hx, DM-2, PAF, osteoporosis,   patient with diastolic chf and hx of intubation in June 2010 (based on chart review) for RESISTANT ACINETOBACTER lower lobe pneumonia  ? Following ankle fracture then. Admitted with resp distress on 04/21/2012. Being treated for acute diastolic chf but without improvement despite 1400cc negative. On 04/23/12 AM noted to have decompensation with resp status overnight and needing 50% face mask and RR 24-30 but speaking full sentences. CXR c/w bilateral infiltrates. PCCM consulted 04/23/12. Patient admitted as full code but after discussion with CCM patient NO CPR but full medical care and desiring short term ventilation only (no LTAC, no trach)  Note:  Last admit to [place] [facility] was 2010 but had one admit interim to [place], [place] DEVICES bipap 04/23/12 >> [place] ? 04/21/2012 >>  MICRO Results for orders placed during the hospital encounter of 04/21/12  MRSA PCR SCREENING     Status: Normal   Collection Time   04/21/12  5:25 PM      Component Value Range Status Comment   MRSA by PCR NEGATIVE  NEGATIVE Final     ANTIBIOTICs Anti-infectives     Start     Dose/Rate Route Frequency Ordered Stop   04/24/12 1400   cefTAZidime (FORTAZ) 1 g in dextrose 5 % 50 mL IVPB        1 g 100 mL/hr over 30 Minutes Intravenous 3 times per day 04/24/12 1256     04/23/12 1700   levofloxacin (LEVAQUIN) IVPB 750 mg  Status:  Discontinued        750 mg 100 mL/hr over 90 Minutes Intravenous Every 24 hours 04/22/12 1639 04/24/12 1256   04/23/12 1400   piperacillin-tazobactam (ZOSYN) IVPB 3.375 g  Status:  Discontinued        3.375 g 100 mL/hr over 30 Minutes  Intravenous 3 times per day 04/23/12 1058 04/24/12 1256   04/23/12 1200   vancomycin (VANCOCIN) IVPB 1000 mg/200 mL premix  Status:  Discontinued        1,000 mg 200 mL/hr over 60 Minutes Intravenous Every 24 hours 04/23/12 1057 04/24/12 1256   04/22/12 1630   levofloxacin (LEVAQUIN) IVPB 750 mg  Status:  Discontinued        750 mg 100 mL/hr over 90 Minutes Intravenous Every 24 hours 04/22/12 1618 04/22/12 1639         Events Since Admission: 04/21/2012 12:34 PM >>  Subjective: BiPAP  last 24 hours, currently comfortable on 6L/min [place]  Vital Signs: Temp:  [97.5 F (36.4 C)-98.5 F (36.9 C)] 98.5 F (36.9 C) (10/28 0800) Pulse Rate:  [70-106] 102  (10/28 0800) Resp:  [20-28] 28  (10/28 0800) BP: (91-123)/(47-55) 118/55 mmHg (10/28 0800) SpO2:  [88 %-96 %] 93 % (10/28 0800) FiO2 (%):  [50 %] 50 % (10/28 0800) Weight:  [66.1 kg (145 lb 11.6 oz)] 66.1 kg (145 lb 11.6 oz) (10/28 0619)  Intake/Output Summary (Last 24 hours) at 04/27/12 1129 Last data filed at 04/27/12 0800  Gross per 24 hour  Intake    485 ml  Output   1800 ml  Net  -1315 ml    Physical Examination: General:  Frail  eldeerly female, comfortable 6L Neuro:  Alert and Oriented x 3. RASS +1. GCS 15. Moves all 4 ext HEENT:  No lesions  Neck:  Supple Cardiovascular:  Regular, 90's. No murmurs Lungs:  RR 34, Acc muscle +,. Full sentences + Abdomen:  Soft, non tender Musculoskeletal:  Cachectic, No edema, No cyanosis Skin:  Intact   Dg Chest 2 View  04/22/2012  *RADIOLOGY REPORT*  Clinical Data: Follow up possible pneumonia  CHEST - 2 VIEW  Comparison: 04/21/2012  Findings: Multifocal patchy airspace opacities, stable versus mildly increased, possibly reflecting multifocal pneumonia or asymmetric interstitial edema.  Small left pleural effusion.  No pneumothorax.  The heart is top normal in size.  Degenerative changes of the visualized thoracolumbar spine.  IMPRESSION: Multifocal patchy airspace opacities, stable  versus mildly increased, possibly reflecting multifocal pneumonia or asymmetric interstitial edema.  Small left pleural effusion.   Original Report Authenticated By: Charline Bills, M.D.      Principal Problem:  *Acute and chronic respiratory failure Active Problems:  HYPERCHOLESTEROLEMIA  TOBACCO USE  HYPERTENSION  Atrial fibrillation  DIABETES MELLITUS, TYPE II  Dementia  Acute diastolic CHF (congestive heart failure)  COPD (chronic obstructive pulmonary disease)   ASSESSMENT AND PLAN  PULMONARY  Lab 04/24/12 0930 04/23/12 1324  PHART 7.399 7.419  PCO2ART 63.2* 61.0*  PO2ART 75.2* 88.0  HCO3 38.3* 39.4*  O2SAT 94.9 96.0   Ventilator Settings: Vent Mode:  [-]  FiO2 (%):  [50 %] 50 %  A:  Acute on chronic resp failure to COPD, CHF +/- PNA. Clinical picture, WBC, Pct argue against PNA.  Spoke with the patient extensively and record reviewed.  Hypoxemia is likely a combination of PNA and COPD with pulmonary edema as a contributing but not major factor.  The interesting part is the patient desats at night more than during the day and falls asleep easily during the day.  OSA is likely a major contributing factor here. P:   BiPAP while asleep. Intubate short term if needed but appears stable for now. Agree with diuresis to dry weight. Covered empirically for HCAP 10/24, reduced to fortaz alone on 10/25 with no evidence of fever, WBC only increased slightly. Xopenex + Atrovent + Pulmicort as ordered. Will likely need home O2 will order and ambulatory desaturation study.  CARDIOVASCULAR  Lab 04/27/12 0615 04/22/12 0440 04/22/12 0019 04/21/12 1819 04/21/12 1313  TROPONINI -- <0.30 <0.30 <0.30 --  LATICACIDVEN -- -- -- -- --  PROBNP 1311.0* -- -- -- 6141.0*    A: Acute diastolic CHF A fib w controlled rate P:  Diuresis Diltiazem to po ASA Atorvastatin  RENAL  Lab 04/27/12 0615 04/26/12 0540 04/25/12 0520 04/24/12 0440 04/23/12 0134  NA 140 138 145 138 138  K 3.6  3.2* -- -- --  CL 91* 88* 94* 91* 92*  CO2 42* 44* 44* 37* 35*  BUN 29* 32* 34* 34* 36*  CREATININE 0.78 0.76 0.84 0.87 0.84  CALCIUM 9.3 9.5 9.8 9.2 9.1  MG -- -- -- -- --  PHOS -- -- -- -- --   Intake/Output      10/27 0701 - 10/28 0700 10/28 0701 - 10/29 0700   P.O.     I.V. (mL/kg) 412.5 (6.2) 27.5 (0.4)   IV Piggyback 110    Total Intake(mL/kg) 522.5 (7.9) 27.5 (0.4)   Urine (mL/kg/hr) 1800 (1.1)    Total Output 1800    Net -1277.5 +27.5         A:  Hypokalemia  P:   - replaced, following BMP  GASTROINTESTINAL No results found for this basename: AST:5,ALT:5,ALKPHOS:5,BILITOT:5,PROT:5,ALBUMIN:5 in the last 168 hours  A:  nutrition P:  May start diet from a pulmonary standpoint.  HEMATOLOGIC  Lab 04/27/12 0615 04/26/12 0540 04/25/12 0520 04/22/12 0440 04/21/12 1347 04/21/12 1313  HGB 13.0 12.9 12.8 12.4 12.6 --  HCT 39.2 40.3 39.7 37.5 37.0 --  PLT 279 296 268 202 -- 181  INR -- -- -- -- -- --  APTT -- -- -- -- -- --   A:  AT risk for anemia criticl illness P:  - PRBC for hgb </= 6.9gm%    INFECTIOUS  Lab 04/27/12 0615 04/26/12 0540 04/25/12 0520 04/24/12 0440 04/23/12 1144 04/22/12 0440 04/21/12 1313  WBC 12.5* 13.6* 11.0* -- -- 6.0 6.5  PROCALCITON -- -- 0.26 0.47 0.15 -- --   Cultures: Results for orders placed during the hospital encounter of 04/21/12  MRSA PCR SCREENING     Status: Normal   Collection Time   04/21/12  5:25 PM      Component Value Range Status Comment   MRSA by PCR NEGATIVE  NEGATIVE Final     Antibiotics: Anti-infectives     Start     Dose/Rate Route Frequency Ordered Stop   04/24/12 1400   cefTAZidime (FORTAZ) 1 g in dextrose 5 % 50 mL IVPB        1 g 100 mL/hr over 30 Minutes Intravenous 3 times per day 04/24/12 1256     04/23/12 1700   levofloxacin (LEVAQUIN) IVPB 750 mg  Status:  Discontinued        750 mg 100 mL/hr over 90 Minutes Intravenous Every 24 hours 04/22/12 1639 04/24/12 1256   04/23/12 1400    piperacillin-tazobactam (ZOSYN) IVPB 3.375 g  Status:  Discontinued        3.375 g 100 mL/hr over 30 Minutes Intravenous 3 times per day 04/23/12 1058 04/24/12 1256   04/23/12 1200   vancomycin (VANCOCIN) IVPB 1000 mg/200 mL premix  Status:  Discontinued        1,000 mg 200 mL/hr over 60 Minutes Intravenous Every 24 hours 04/23/12 1057 04/24/12 1256   04/22/12 1630   levofloxacin (LEVAQUIN) IVPB 750 mg  Status:  Discontinued        750 mg 100 mL/hr over 90 Minutes Intravenous Every 24 hours 04/22/12 1618 04/22/12 1639         A:  ? HCAP - Pct, WBC, clinical hx argue against. Has a hx of resistant acinitobacter from 2010 P:   - Will reculture blood today and F/U. - Prior cx's acinitobacter resistant to zosyn (high MIC) and levaquin, intermediate to ceftaz, ceftriaxone, tobra. Will continue fortaz for now.  ENDOCRINE  Lab 04/27/12 0810 04/26/12 2145 04/26/12 1634 04/26/12 1149 04/26/12 0756  GLUCAP 136* 126* 215* 203* 185*   A:  Hyperglycemia, hx DM P:   ssi  NEUROLOGIC  A:  Intact P:   monitor  BEST PRACTICE / DISPOSITION Level of Care:  SDU Primary Service:  cards Consultants:  pccm Code Status:  Made LCB  Diet:  npo DVT Px:  Heparin GTT GI Px:  PO piet  Alyson Reedy, M.D. [facility] Pulmonary/Critical Care Medicine. Pager: [phone]. After hours pager: [phone].

## 2012-04-28 ENCOUNTER — Telehealth: Payer: Self-pay

## 2012-04-28 LAB — BASIC METABOLIC PANEL
CO2: 42 mEq/L (ref 19–32)
Chloride: 89 mEq/L — ABNORMAL LOW (ref 96–112)
GFR calc non Af Amer: 63 mL/min — ABNORMAL LOW (ref >90)
Glucose, Bld: 123 mg/dL — ABNORMAL HIGH (ref 70–99)
Potassium: 3.8 mEq/L (ref 3.5–5.1)
Sodium: 139 mEq/L (ref 135–145)

## 2012-04-28 LAB — HEPARIN LEVEL (UNFRACTIONATED): Heparin Unfractionated: 0.67 IU/mL (ref 0.30–0.70)

## 2012-04-28 LAB — CBC
Hemoglobin: 13.4 g/dL (ref 12.0–15.0)
MCHC: 33.1 g/dL (ref 30.0–36.0)
RBC: 4.44 MIL/uL (ref 3.87–5.11)
WBC: 13.4 10*3/uL — ABNORMAL HIGH (ref 4.0–10.5)

## 2012-04-28 LAB — GLUCOSE, CAPILLARY: Glucose-Capillary: 130 mg/dL — ABNORMAL HIGH (ref 70–99)

## 2012-04-28 LAB — POCT I-STAT 3, ART BLOOD GAS (G3+)
Acid-Base Excess: 23 mmol/L — ABNORMAL HIGH (ref 0.0–2.0)
O2 Saturation: 84 %
pCO2 arterial: 71.2 mmHg (ref 35.0–45.0)

## 2012-04-28 MED ORDER — ACETAZOLAMIDE SODIUM 500 MG IJ SOLR
250.0000 mg | Freq: Three times a day (TID) | INTRAMUSCULAR | Status: AC
  Administered 2012-04-28 (×2): 250 mg via INTRAVENOUS
  Filled 2012-04-28 (×2): qty 500

## 2012-04-28 MED ORDER — FUROSEMIDE 10 MG/ML IJ SOLN: 20.0000 mg | Freq: Three times a day (TID) | INTRAMUSCULAR | Status: DC

## 2012-04-28 NOTE — Progress Notes (Signed)
ABG showed a rise in PaCO2 to 71 with a pH of 7.47 which would explain the mental status.  Will hold all lasix, give acetazolamide, attempt to avoid intubation as much as possible, no BiPAP for now as to avoid further alkalosis.  Monitor closely.  Maintain O2 sat in the 88-92% range.  Alyson Reedy, M.D. [facility] Pulmonary/Critical Care Medicine. Pager: [phone]. After hours pager: [phone].

## 2012-04-28 NOTE — Telephone Encounter (Signed)
I spoke to him - rev the records I had in the chart so far, he had very good questions and will pose them to the hospital doctors

## 2012-04-28 NOTE — Progress Notes (Signed)
CRITICAL VALUE ALERT  Critical value received:  CO2 =42 Date of notification: 04/28/12 Time of notification:  0150 Critical value read back:yes  Nurse who received alert: M. Rosine Solecki, RN MD notified (1st page): No, not much change with prior result. Time of first page:  MD notified (2nd page):  Time of second page:  Responding MD:   Time MD responded:

## 2012-04-28 NOTE — Progress Notes (Signed)
ANTICOAGULATION CONSULT NOTE - Follow Up Consult  Pharmacy Consult for heparin Indication: atrial fibrillation  Allergies  Allergen Reactions  . Azithromycin     REACTION: inc blood sugar  . Codeine Itching  . Coumadin (Warfarin) Other (See Comments)    Causes bleeding  . Oxybutynin Chloride Er     Constipation/ dizziness/ dry mouth    Patient Measurements: Height: 5\' 6"  (167.6 cm) Weight: 145 lb 11.6 oz (66.1 kg) IBW/kg (Calculated) : 59.3  Heparin Dosing Weight: 66 kg  Vital Signs: Temp: 98.1 F (36.7 C) (10/29 0000) Temp src: Oral (10/29 0000) BP: 99/56 mmHg (10/28 2335) Pulse Rate: 59  (10/29 0000)  Labs:  Basename 04/28/12 0100 04/28/12 0035 04/27/12 1652 04/27/12 1033 04/27/12 0615 04/26/12 0540  HGB 13.4 -- -- -- 13.0 --  HCT 40.5 -- -- -- 39.2 40.3  PLT 289 -- -- -- 279 296  APTT -- -- -- -- -- --  LABPROT -- -- -- -- -- --  INR -- -- -- -- -- --  HEPARINUNFRC -- 0.89* 1.05* -- <0.10* --  CREATININE -- -- -- 0.76 0.78 0.76  CKTOTAL -- -- -- -- -- --  CKMB -- -- -- -- -- --  TROPONINI -- -- -- -- -- --    Estimated Creatinine Clearance: 52.5 ml/min (by C-G formula based on Cr of 0.76).   Medications:  Scheduled:    . antiseptic oral rinse  15 mL Mouth Rinse BID  . aspirin  81 mg Oral Daily  . atorvastatin  10 mg Oral q1800  . budesonide  0.25 mg Nebulization BID  . calcium carbonate  1 tablet Oral BID WC  . cefTAZidime (FORTAZ)  IV  1 g Intravenous Q8H  . cholecalciferol  2,000 Units Oral Daily  . digoxin  0.125 mg Oral Daily  . diltiazem  240 mg Oral Daily  . furosemide  40 mg Intravenous TID  . glyBURIDE  5 mg Oral Daily  . heparin  2,000 Units Intravenous Once  . insulin aspart  0-15 Units Subcutaneous TID WC  . insulin aspart  0-5 Units Subcutaneous QHS  . insulin aspart  3 Units Subcutaneous TID WC  . ipratropium  0.5 mg Nebulization QID  . levalbuterol  0.63 mg Nebulization Q4H  . metFORMIN  500 mg Oral BID AC  . multivitamin with  minerals  1 tablet Oral Daily  . nicotine  21 mg Transdermal Daily  . potassium chloride  40 mEq Oral TID  . potassium chloride  40 mEq Oral Once  . DISCONTD: digoxin  0.25 mg Oral Daily  . DISCONTD: diltiazem  180 mg Oral Daily  . DISCONTD: diltiazem  60 mg Oral Q8H  . DISCONTD: potassium chloride  40 mEq Oral Once   Infusions:    . sodium chloride 10 mL/hr (04/27/12 2229)  . heparin 1,750 Units/hr (04/27/12 1739)  . DISCONTD: heparin 1,750 Units/hr (04/27/12 0700)    Assessment: 78 yr female with afib is currently on supratherapeutic heparin.  Heparin level was 0.89. Goal of Therapy:  Heparin level 0.3-0.7 units/ml Monitor platelets by anticoagulation protocol: Yes   Plan:  1) Reduce heparin to 1600 units/hr. 2) Check an 8 hour heparin level after rate is changed.  Cristian Davitt, Tsz-Yin 04/28/2012,1:36 AM

## 2012-04-28 NOTE — Telephone Encounter (Signed)
pts son Tekesha Almgren said pt in [facility] for lt sided CHF,possible pneumonia. [name] said cannot get info from [employer] re: pt's care plan. When spoke with Cardiologist said dr did not know if pt had pneumonia or if pts problem is totally related to heart failure. [name] spoke with pulmonologist and he could not confirm pneumonia.Wants to talk with Dr Milinda Antis about pts care plan long term for pt. Weber Cooks that needs to talk with physicians that have pt in hospital. [name] still wants to speak with Dr Milinda Antis.

## 2012-04-28 NOTE — Progress Notes (Signed)
PT Cancellation Note  Patient Details Name: [name] MRN: [id number] DOB: 1930/01/23   Cancelled Treatment:    Reason Eval/Treat Not Completed: Other (comment) (RN requested hold today secondary to AMS with alkalosis) Per RN pt has been confused and moving in bed all day, has settled and is relatively lethargic at this point and would prefer pt be able to rest and recover respiratory status today prior to resuming therapy. Will attempt at later date.  Thanks Delaney Meigs, PT [phone]

## 2012-04-28 NOTE — Progress Notes (Signed)
ANTICOAGULATION CONSULT NOTE - Follow Up Consult  Pharmacy Consult for Heparin Indication: atrial fibrillation  Allergies  Allergen Reactions  . Azithromycin     REACTION: inc blood sugar  . Codeine Itching  . Coumadin (Warfarin) Other (See Comments)    Causes bleeding  . Oxybutynin Chloride Er     Constipation/ dizziness/ dry mouth    Patient Measurements: Height: 5\' 6"  (167.6 cm) Weight: 145 lb 8.1 oz (66 kg) IBW/kg (Calculated) : 59.3  Heparin Dosing Weight: 66 kg  Vital Signs: Temp: 97.9 F (36.6 C) (10/29 1247) Temp src: Oral (10/29 1247) BP: 111/62 mmHg (10/29 1100) Pulse Rate: 87  (10/29 1247)  Labs:  Alvira Philips 04/28/12 0927 04/28/12 0100 04/28/12 0035 04/27/12 1652 04/27/12 1033 04/27/12 0615 04/26/12 0540  HGB -- 13.4 -- -- -- 13.0 --  HCT -- 40.5 -- -- -- 39.2 40.3  PLT -- 289 -- -- -- 279 296  APTT -- -- -- -- -- -- --  LABPROT -- -- -- -- -- -- --  INR -- -- -- -- -- -- --  HEPARINUNFRC 0.67 -- 0.89* 1.05* -- -- --  CREATININE -- 0.85 -- -- 0.76 0.78 --  CKTOTAL -- -- -- -- -- -- --  CKMB -- -- -- -- -- -- --  TROPONINI -- -- -- -- -- -- --    Estimated Creatinine Clearance: 49.4 ml/min (by C-G formula based on Cr of 0.85).  Assessment:  Heparin level is now therapeutic on 1600 units/hr.  CBC stable.  Day # 8 IV heparin since 10/22.  Not on Coumadin prior to admission due to hx AVMs and GIB, along with baseline dementia.  Goal of Therapy:  Heparin level 0.3-0.7 units/ml Monitor platelets by anticoagulation protocol: Yes   Plan:   Continue heparin drip at 1600 units/hr.  Continue daily heparin level and CBC.  Will follow up anticoag plans.    Dennie Fetters, RPh Pager: [phone] 04/28/2012,1:23 PM

## 2012-04-28 NOTE — Progress Notes (Signed)
Name: [name] MRN: [id number] DOB: 1930/05/10    LOS: 7 Date of admit 04/21/2012   Referring Provider:  Dr Antoine Poche cards Reason for Referral:  Acute Respiratory failure  PULMONARY / CRITICAL CARE MEDICINE  HPI:  78 year old COPD NOS, mild dementia per chart (cognitively with capacity on 04/23/12), AVM colon hx, DM-2, PAF, osteoporosis,   patient with diastolic chf and hx of intubation in June 2010 (based on chart review) for RESISTANT ACINETOBACTER lower lobe pneumonia  ? Following ankle fracture then. Admitted with resp distress on 04/21/2012. Being treated for acute diastolic chf but without improvement despite 1400cc negative. On 04/23/12 AM noted to have decompensation with resp status overnight and needing 50% face mask and RR 24-30 but speaking full sentences. CXR c/w bilateral infiltrates. PCCM consulted 04/23/12. Patient admitted as full code but after discussion with CCM patient NO CPR but full medical care and desiring short term ventilation only (no LTAC, no trach)  Note:  Last admit to [facility] was 2010 but had one admit interim to [facility], [place] DEVICES bipap 04/23/12 >> [place] ? 04/21/2012 >>  MICRO Results for orders placed during the hospital encounter of 04/21/12  MRSA PCR SCREENING     Status: Normal   Collection Time   04/21/12  5:25 PM      Component Value Range Status Comment   MRSA by PCR NEGATIVE  NEGATIVE Final     ANTIBIOTICs Anti-infectives     Start     Dose/Rate Route Frequency Ordered Stop   04/24/12 1400   cefTAZidime (FORTAZ) 1 g in dextrose 5 % 50 mL IVPB        1 g 100 mL/hr over 30 Minutes Intravenous 3 times per day 04/24/12 1256     04/23/12 1700   levofloxacin (LEVAQUIN) IVPB 750 mg  Status:  Discontinued        750 mg 100 mL/hr over 90 Minutes Intravenous Every 24 hours 04/22/12 1639 04/24/12 1256   04/23/12 1400   piperacillin-tazobactam (ZOSYN) IVPB 3.375 g  Status:  Discontinued        3.375 g 100 mL/hr over 30 Minutes  Intravenous 3 times per day 04/23/12 1058 04/24/12 1256   04/23/12 1200   vancomycin (VANCOCIN) IVPB 1000 mg/200 mL premix  Status:  Discontinued        1,000 mg 200 mL/hr over 60 Minutes Intravenous Every 24 hours 04/23/12 1057 04/24/12 1256   04/22/12 1630   levofloxacin (LEVAQUIN) IVPB 750 mg  Status:  Discontinued        750 mg 100 mL/hr over 90 Minutes Intravenous Every 24 hours 04/22/12 1618 04/22/12 1639         Events Since Admission: 04/21/2012 12:34 PM >>  Subjective: BiPAP  last 24 hours, currently comfortable on 6L/min [place]  Vital Signs: Temp:  [96.5 F (35.8 C)-99.6 F (37.6 C)] 96.5 F (35.8 C) (10/29 0800) Pulse Rate:  [59-124] 84  (10/29 0800) Resp:  [18-33] 27  (10/29 0309) BP: (99-123)/(49-66) 111/62 mmHg (10/29 1100) SpO2:  [90 %-98 %] 92 % (10/29 1100) FiO2 (%):  [40 %-44 %] 44 % (10/29 0906) Weight:  [66 kg (145 lb 8.1 oz)] 66 kg (145 lb 8.1 oz) (10/29 0441)  Intake/Output Summary (Last 24 hours) at 04/28/12 1138 Last data filed at 04/28/12 1100  Gross per 24 hour  Intake  891.2 ml  Output   1025 ml  Net -133.8 ml    Physical Examination: General:  Frail eldeerly female,  comfortable 6L Neuro:  Alert and Oriented x 3. RASS +1. GCS 15. Moves all 4 ext HEENT:  No lesions  Neck:  Supple Cardiovascular:  Regular, 90's. No murmurs Lungs:  RR 34, Acc muscle +,. Full sentences + Abdomen:  Soft, non tender Musculoskeletal:  Cachectic, No edema, No cyanosis Skin:  Intact   Dg Chest 2 View  04/22/2012  *RADIOLOGY REPORT*  Clinical Data: Follow up possible pneumonia  CHEST - 2 VIEW  Comparison: 04/21/2012  Findings: Multifocal patchy airspace opacities, stable versus mildly increased, possibly reflecting multifocal pneumonia or asymmetric interstitial edema.  Small left pleural effusion.  No pneumothorax.  The heart is top normal in size.  Degenerative changes of the visualized thoracolumbar spine.  IMPRESSION: Multifocal patchy airspace opacities, stable  versus mildly increased, possibly reflecting multifocal pneumonia or asymmetric interstitial edema.  Small left pleural effusion.   Original Report Authenticated By: Charline Bills, M.D.      Principal Problem:  *Acute and chronic respiratory failure Active Problems:  HYPERCHOLESTEROLEMIA  TOBACCO USE  HYPERTENSION  Atrial fibrillation  DIABETES MELLITUS, TYPE II  Dementia  Acute diastolic CHF (congestive heart failure)  COPD (chronic obstructive pulmonary disease)   ASSESSMENT AND PLAN  PULMONARY  Lab 04/24/12 0930 04/23/12 1324  PHART 7.399 7.419  PCO2ART 63.2* 61.0*  PO2ART 75.2* 88.0  HCO3 38.3* 39.4*  O2SAT 94.9 96.0   Ventilator Settings: Vent Mode:  [-]  FiO2 (%):  [40 %-44 %] 44 %  A:  Acute on chronic resp failure to COPD, CHF +/- PNA. Clinical picture, WBC, Pct argue against PNA.  Spoke with the patient extensively and record reviewed.  Hypoxemia is likely a combination of PNA and COPD with pulmonary edema as a contributing but not major factor.  The interesting part is the patient desats at night more than during the day and falls asleep easily during the day.  OSA is likely a major contributing factor here.  More confused this AM. P:   BiPAP while asleep. Intubate short term if needed but appears stable for now. Agree with diuresis to dry weight. Covered empirically for HCAP 10/24, reduced to fortaz alone on 10/25 with no evidence of fever, WBC improved, would finish a total abx days of 8 days today is 6 out of 8. Xopenex + Atrovent + Pulmicort as ordered. Will likely need home O2 will order and ambulatory desaturation study likely not necessary given desaturation at rest. Will need home BiPAP if saturation is <=88% overnight on 2 or more liter of O2. Please consult case management for needed equipment. Will check ABG, wonder if patient has become to metabolically alkalotic that now she is accumulating too much CO2 and causing mental status  changes.  CARDIOVASCULAR  Lab 04/27/12 0615 04/22/12 0440 04/22/12 0019 04/21/12 1819 04/21/12 1313  TROPONINI -- <0.30 <0.30 <0.30 --  LATICACIDVEN -- -- -- -- --  PROBNP 1311.0* -- -- -- 6141.0*    A: Acute diastolic CHF A fib w controlled rate P:  Decrease lasix to half dose for now as the bicarb is in the 40's now, would give a dose of acetazolamide presenting bicarb of 35. Diltiazem to po. ASA. Atorvastatin.  RENAL  Lab 04/28/12 0100 04/27/12 1033 04/27/12 0615 04/26/12 0540 04/25/12 0520  NA 139 137 140 138 145  K 3.8 3.7 -- -- --  CL 89* 89* 91* 88* 94*  CO2 42* 43* 42* 44* 44*  BUN 33* 32* 29* 32* 34*  CREATININE 0.85 0.76 0.78 0.76 0.84  CALCIUM 10.1 9.4 9.3 9.5 9.8  MG -- -- -- -- --  PHOS -- -- -- -- --   Intake/Output      10/28 0701 - 10/29 0700 10/29 0701 - 10/30 0700   P.O. 290    I.V. (mL/kg) 685.6 (10.4) 104 (1.6)   IV Piggyback 54 8   Total Intake(mL/kg) 1029.6 (15.6) 112 (1.7)   Urine (mL/kg/hr) 1850 (1.2) 375 (1.2)   Total Output 1850 375   Net -820.4 -263         A:  Hypokalemia P:   - replaced, following BMP  GASTROINTESTINAL No results found for this basename: AST:5,ALT:5,ALKPHOS:5,BILITOT:5,PROT:5,ALBUMIN:5 in the last 168 hours  A:  nutrition P:  May start diet from a pulmonary standpoint.  HEMATOLOGIC  Lab 04/28/12 0100 04/27/12 0615 04/26/12 0540 04/25/12 0520 04/22/12 0440  HGB 13.4 13.0 12.9 12.8 12.4  HCT 40.5 39.2 40.3 39.7 37.5  PLT 289 279 296 268 202  INR -- -- -- -- --  APTT -- -- -- -- --   A:  AT risk for anemia criticl illness P:  - PRBC for hgb </= 6.9gm%    INFECTIOUS  Lab 04/28/12 0100 04/27/12 0615 04/26/12 0540 04/25/12 0520 04/24/12 0440 04/23/12 1144 04/22/12 0440  WBC 13.4* 12.5* 13.6* 11.0* -- -- 6.0  PROCALCITON -- -- -- 0.26 0.47 0.15 --   Cultures: Results for orders placed during the hospital encounter of 04/21/12  MRSA PCR SCREENING     Status: Normal   Collection Time   04/21/12  5:25 PM       Component Value Range Status Comment   MRSA by PCR NEGATIVE  NEGATIVE Final     Antibiotics: Anti-infectives     Start     Dose/Rate Route Frequency Ordered Stop   04/24/12 1400   cefTAZidime (FORTAZ) 1 g in dextrose 5 % 50 mL IVPB        1 g 100 mL/hr over 30 Minutes Intravenous 3 times per day 04/24/12 1256     04/23/12 1700   levofloxacin (LEVAQUIN) IVPB 750 mg  Status:  Discontinued        750 mg 100 mL/hr over 90 Minutes Intravenous Every 24 hours 04/22/12 1639 04/24/12 1256   04/23/12 1400   piperacillin-tazobactam (ZOSYN) IVPB 3.375 g  Status:  Discontinued        3.375 g 100 mL/hr over 30 Minutes Intravenous 3 times per day 04/23/12 1058 04/24/12 1256   04/23/12 1200   vancomycin (VANCOCIN) IVPB 1000 mg/200 mL premix  Status:  Discontinued        1,000 mg 200 mL/hr over 60 Minutes Intravenous Every 24 hours 04/23/12 1057 04/24/12 1256   04/22/12 1630   levofloxacin (LEVAQUIN) IVPB 750 mg  Status:  Discontinued        750 mg 100 mL/hr over 90 Minutes Intravenous Every 24 hours 04/22/12 1618 04/22/12 1639         A:  ? HCAP - Pct, WBC, clinical hx argue against. Has a hx of resistant acinitobacter from 2010 P:   - Cultures sent on 1028 and are pending. - Prior cx's acinitobacter resistant to zosyn (high MIC) and levaquin, intermediate to ceftaz, ceftriaxone, tobra. Will continue fortaz for now for a total of 8 days of abx today is day 6 out of 8.  ENDOCRINE  Lab 04/28/12 0825 04/27/12 2123 04/27/12 1650 04/27/12 1212 04/27/12 0810  GLUCAP 130* 141* 82 278* 136*   A:  Hyperglycemia, hx DM  P:   ssi  NEUROLOGIC  A:  Intact P:   Monitor  Would hold in the SDU overnight given altered mental status either hypercarbia or re-emerging sepsis.  Alyson Reedy, M.D. [facility] Pulmonary/Critical Care Medicine. Pager: [phone]. After hours pager: [phone].

## 2012-04-28 NOTE — Progress Notes (Signed)
Patient Name: [name] Date of Encounter: 04/28/2012  Principal Problem:  *Acute and chronic respiratory failure Active Problems:  HYPERCHOLESTEROLEMIA  TOBACCO USE  HYPERTENSION  Atrial fibrillation  DIABETES MELLITUS, TYPE II  Dementia  Acute diastolic CHF (congestive heart failure)  COPD (chronic obstructive pulmonary disease)    SUBJECTIVE: Pt not aware of tremor, does not remember being SOB off O2 but sats decreased to 86% at rest when she took off her oxygen. Has been OOB very little.  No chest pain or palps.  She is less awake today.    Since I last saw her on 10/23, the echo shows normal LVEV ( 65%), she has developed a contraction alkalosis from her aggressive diuresis.  OBJECTIVE Filed Vitals:   04/28/12 0441 04/28/12 0500 04/28/12 0600 04/28/12 0700  BP:  105/62  123/50  Pulse:      Temp:      TempSrc:      Resp:      Height:      Weight: 145 lb 8.1 oz (66 kg)     SpO2:  93% 98% 96%    Intake/Output Summary (Last 24 hours) at 04/28/12 0838 Last data filed at 04/28/12 0700  Gross per 24 hour  Intake  882.1 ml  Output   1850 ml  Net -967.9 ml   Filed Weights   04/26/12 0500 04/27/12 0619 04/28/12 0441  Weight: 146 lb 2.6 oz (66.3 kg) 145 lb 11.6 oz (66.1 kg) 145 lb 8.1 oz (66 kg)   PHYSICAL EXAM General: Well developed, well nourished, somulent Head: Normocephalic, atraumatic.  Neck: Supple without bruits, JVD at about 8 cm. Lungs:  Resp regular and unlabored, dense rales left > right bases. Heart: irregular R&R, S1, S2, no S3, S4, or murmur. Abdomen: Soft, non-tender, non-distended, BS + x 4.  Extremities: No clubbing, cyanosis, no edema.  Neuro: Alert and oriented X 2. Moves all extremities spontaneously. Psych: Normal affect.  LABS: CBC: Basename 04/28/12 0100 04/27/12 0615  WBC 13.4* 12.5*  NEUTROABS -- --  HGB 13.4 13.0  HCT 40.5 39.2  MCV 91.2 92.0  PLT 289 279   INR:No results found for this basename: INR in the last 72  hours Basic Metabolic Panel: Basename 04/28/12 0100 04/27/12 1033  NA 139 137  K 3.8 3.7  CL 89* 89*  CO2 42* 43*  GLUCOSE 123* 300*  BUN 33* 32*  CREATININE 0.85 0.76  CALCIUM 10.1 9.4  MG -- --  PHOS -- --   BNP: Pro B Natriuretic peptide (BNP)  Date/Time Value Range Status  04/27/2012  6:15 AM 1311.0* 0 - 450 pg/mL Final  04/21/2012  1:13 PM 6141.0* 0 - 450 pg/mL Final    TELE:   Atrial fib, RVR at times but rate control improving  Radiology/Studies: Dg Chest Port 1 View  04/25/2012 *RADIOLOGY REPORT* Clinical Data: Infiltrates. PORTABLE CHEST - 1 VIEW Comparison: Two-view chest 04/22/2012. Findings: The heart size is exaggerated by low lung volumes. The left lower lobe airspace disease is increasing. Bilateral pleural effusions have increased, worse on the left. Right basilar airspace disease is similar to the prior exam. IMPRESSION: 1. Increasing left lower lobe airspace disease, compatible with pneumonia. 2. And patchy areas of airspace disease persist compatible with pneumonia or edema. 3. Increasing bilateral pleural effusions, worse on the left. Original Report Authenticated By: Jamesetta Orleans. MATTERN, M.D.   Dg Swallowing Func-speech Pathology  04/22/2012 Vivi Ferns McCoy, CCC-SLP 04/22/2012 2:51 PM Objective Swallowing Evaluation: Modified Barium  Swallowing Study Patient Details Name: [name] MRN: [id number] Date of Birth: 1929-11-12 Today's Date: 04/22/2012 Time: 1420-1440 SLP Time Calculation (min): 20 min Past Medical History: Past Medical History Diagnosis Date . Allergy allergic Rhintis . Hyperlipidemia . Hypertension . Osteoporosis . PAF (paroxysmal atrial fibrillation) a. dates back > 5 yrs, prev on coumadin->d/c 2/2 GIB; b. 12/2011 Echo: EF 60-65%, nl wall motion Triv MR/TR. [name] Carotid stenosis a. 09/2007: Carotid Doppler stable- stable bilateral stenosis 40-59% (from 06) . Type II diabetes mellitus . Tobacco abuse a. ongoing - 50+ pack yr hx. . Dementia Dementia in  Hospital //Does O.K. at home. . Diverticulosis . GI bleeding a. when on coumadin in 2006 - colonoscopy @ that time showed 2 large right colon avm's, multiple small bowel avm's, fresh blood in prox small bowel. . AVM (arteriovenous malformation) of colon a. 2006 colonscopy Past Surgical History: Past Surgical History Procedure Date . Cardiac catheterization 1990's clear . Orif tibia & fibula fractures 05/2004 . Small bowel enteroscopy 03/2005 [facility] HPI: [name] is an 78 yo with hx of PAF, HTN, COPD ( ongoing cigarette smoking) admitted with a COPD exacerbation and rapid atrial fibrillation. Diagnosed with probably bilateral PNA. Patient reports that she has had PNa frequently since esophagus was dilated a few years ago. Assessment / Plan / Recommendation Clinical Impression Clinical impression: Patient presents with a functional oropharyngeal swallow which is largely [facility] with the exception of intermittent trace pharyngeal residuals noted post swallow (? due to generalized weakness) which clear with spontaneous dry swallows. No aspiration or penetration observed. Recommend continuation of a regular diet, thin liquids with general safe swallowing and reflux precautions given h/o GERD, esophageal stenosis. Per RN, son concerned about patient's general decline for solid foods prior to admission. Although no significant backflow of bolus into the pharynx noted during today's exam, f/u esophageal w/u may be beneficial if recurrent stricture or additional esophageal dysfunction remains a concern. SLP will f/u briefly at bedside for diet tolerance and education. Treatment Recommendation Therapy as outlined in treatment plan below Diet Recommendation Regular;Thin liquid Liquid Administration via: Cup;Straw Medication Administration: Whole meds with liquid Supervision: Patient able to self feed;Intermittent supervision to cue for compensatory strategies Compensations: Slow rate;Small sips/bites Postural Changes and/or  Swallow Maneuvers: Seated upright 90 degrees;Upright 30-60 min after meal Other Recommendations Oral Care Recommendations: Oral care BID Follow Up Recommendations None Frequency and Duration min 2x/week 2 weeks Pertinent Vitals/Pain n/a SLP Swallow Goals Patient will utilize recommended strategies during swallow to increase swallowing safety with: Supervision/safety Swallow Study Goal #2 - Progress: Not met    Current Medications:     . antiseptic oral rinse  15 mL Mouth Rinse BID  . aspirin  81 mg Oral Daily  . atorvastatin  10 mg Oral q1800  . budesonide  0.25 mg Nebulization BID  . calcium carbonate  1 tablet Oral BID WC  . cefTAZidime (FORTAZ)  IV  1 g Intravenous Q8H  . cholecalciferol  2,000 Units Oral Daily  . digoxin  0.125 mg Oral Daily  . diltiazem  240 mg Oral Daily  . furosemide  40 mg Intravenous TID  . glyBURIDE  5 mg Oral Daily  . heparin  2,000 Units Intravenous Once  . insulin aspart  0-15 Units Subcutaneous TID WC  . insulin aspart  0-5 Units Subcutaneous QHS  . insulin aspart  3 Units Subcutaneous TID WC  . ipratropium  0.5 mg Nebulization QID  . levalbuterol  0.63 mg Nebulization Q4H  .  metFORMIN  500 mg Oral BID AC  . multivitamin with minerals  1 tablet Oral Daily  . nicotine  21 mg Transdermal Daily  . potassium chloride  40 mEq Oral TID  . potassium chloride  40 mEq Oral Once      . sodium chloride 10 mL/hr (04/27/12 2229)  . heparin 1,600 Units/hr (04/28/12 0355)  . DISCONTD: heparin 1,750 Units/hr (04/27/12 0700)    ASSESSMENT AND PLAN: Principal Problem:  *Acute and chronic respiratory failure - Multifactorial with CHF/COPD/PNA; Pulm seeing and managing COPD/PNA components, CXR 10/26 with worsening PNA, Hx of acinitobacter PNA 2010. BNP is improved,   Active Problems: HYPERCHOLESTEROLEMIA - lipid profile OK on current Rx   TOBACCO USE - cessation encouraged, pt does not appear convinced that she could stop but says she may have to.  HYPERTENSION  - good control on current Rx   Atrial fibrillation - rate control generally good, dilt increased to 240 mg CD daily   DIABETES MELLITUS, TYPE II - SSI, oral Rx, DM diet   Dementia - follow  Deconditioning - being seen by PT/OT, PTA pt was living alone, no options for 24 hr care after d/c, therefore, will need SNF at d/c. Care management to see.     Acute diastolic CHF (congestive heart failure) - see above   COPD (chronic obstructive pulmonary disease) - per CCM   Signed, Theodore Demark , PA-C 8:38 AM 04/28/2012   Attending Note:   The patient was seen and examined.  Agree with assessment and plan as noted above.  I have made changes to the note above where appropriate.  I have discussed the case with Dr. Molli Knock.  We agree that her main problem appears to be pneumonia and COPD.  She has developed a contraction alkalosis and we will DC the Lasix at this time.  I did not think she had significant CHF in my first contact with her last week.  Continue aggressive Rx for her pneumonia and COPD.  ABG is being drawn now to determine if the diamox has helped her alkalosis.   Further recs from PCCM.   Vesta Mixer, Montez Hageman., MD, [facility] 04/28/2012, 11:56 AM

## 2012-04-29 DIAGNOSIS — I4891 Unspecified atrial fibrillation: Secondary | ICD-10-CM

## 2012-04-29 DIAGNOSIS — I5033 Acute on chronic diastolic (congestive) heart failure: Secondary | ICD-10-CM

## 2012-04-29 LAB — CBC
MCH: 30 pg (ref 26.0–34.0)
MCV: 92.9 fL (ref 78.0–100.0)
Platelets: 286 10*3/uL (ref 150–400)
RDW: 12.7 % (ref 11.5–15.5)
WBC: 10.8 10*3/uL — ABNORMAL HIGH (ref 4.0–10.5)

## 2012-04-29 LAB — GLUCOSE, CAPILLARY
Glucose-Capillary: 131 mg/dL — ABNORMAL HIGH (ref 70–99)
Glucose-Capillary: 89 mg/dL (ref 70–99)
Glucose-Capillary: 191 mg/dL — ABNORMAL HIGH (ref 70–99)
Glucose-Capillary: 50 mg/dL — ABNORMAL LOW (ref 70–99)

## 2012-04-29 LAB — BASIC METABOLIC PANEL
Calcium: 11.2 mg/dL — ABNORMAL HIGH (ref 8.4–10.5)
Creatinine, Ser: 0.83 mg/dL (ref 0.50–1.10)
GFR calc Af Amer: 75 mL/min — ABNORMAL LOW (ref >90)

## 2012-04-29 LAB — HEPARIN LEVEL (UNFRACTIONATED)
Heparin Unfractionated: 0.96 IU/mL — ABNORMAL HIGH (ref 0.30–0.70)
Heparin Unfractionated: 0.61 IU/mL (ref 0.30–0.70)

## 2012-04-29 MED ORDER — ACETAZOLAMIDE SODIUM 500 MG IJ SOLR
250.0000 mg | Freq: Four times a day (QID) | INTRAMUSCULAR | Status: AC
  Administered 2012-04-29 – 2012-04-30 (×3): 250 mg via INTRAVENOUS
  Filled 2012-04-29 (×3): qty 500

## 2012-04-29 MED ORDER — DEXTROSE 50 % IV SOLN
25.0000 mL | Freq: Once | INTRAVENOUS | Status: AC | PRN
  Administered 2012-04-29: 25 mL via INTRAVENOUS

## 2012-04-29 MED ORDER — POTASSIUM CHLORIDE CRYS ER 20 MEQ PO TBCR
40.0000 meq | EXTENDED_RELEASE_TABLET | Freq: Once | ORAL | Status: AC
  Administered 2012-04-29: 40 meq via ORAL
  Filled 2012-04-29: qty 2

## 2012-04-29 MED ORDER — DEXTROSE 50 % IV SOLN
INTRAVENOUS | Status: AC
  Filled 2012-04-29: qty 50

## 2012-04-29 NOTE — Clinical Social Work Placement (Addendum)
    Clinical Social Work Department CLINICAL SOCIAL WORK PLACEMENT NOTE 04/29/2012  Patient:  [name], [name]  Account Number:  [id number] Admit date:  04/21/2012  Clinical Social Worker:  Hulan Fray  Date/time:  04/29/2012 11:19 AM  Clinical Social Work is seeking post-discharge placement for this patient at the following level of care:   SKILLED NURSING   (*CSW will update this form in Epic as items are completed)   04/29/2012  Patient/family provided with [facility] Department of Clinical Social Work's list of facilities offering this level of care within the geographic area requested by the patient (or if unable, by the patient's family).  04/29/2012  Patient/family informed of their freedom to choose among providers that offer the needed level of care, that participate in Medicare, Medicaid or managed care program needed by the patient, have an available bed and are willing to accept the patient.  04/29/2012  Patient/family informed of [facility]' ownership interest in [facility], as well as of the fact that they are under no obligation to receive care at this facility.  PASARR submitted to [employer] on 12/19/2008 PASARR number received from [employer] on 12/19/2008  FL2 transmitted to all facilities in geographic area requested by pt/family on  04/29/2012 FL2 transmitted to all facilities within larger geographic area on   Patient informed that his/her managed care company has contracts with or will negotiate with  certain facilities, including the following:     Patient/family informed of bed offers received:  05-01-12 Patient chooses bed at [facility] Physician recommends and patient chooses bed at    Patient to be transferred to  on  05/05/12  Patient to be transferred to facility by [facility]  The following physician request were entered in Epic:   Additional Comments:  Sabino Niemann, MSW, [employer] [phone]

## 2012-04-29 NOTE — Progress Notes (Signed)
ANTICOAGULATION CONSULT NOTE - Follow Up Consult  Pharmacy Consult for Heparin Indication: atrial fibrillation  Allergies  Allergen Reactions  . Azithromycin     REACTION: inc blood sugar  . Codeine Itching  . Coumadin (Warfarin) Other (See Comments)    Causes bleeding  . Oxybutynin Chloride Er     Constipation/ dizziness/ dry mouth    Patient Measurements: Height: 5\' 6"  (167.6 cm) Weight: 142 lb 3.2 oz (64.5 kg) IBW/kg (Calculated) : 59.3  Heparin Dosing Weight: 66 kg  Vital Signs: Temp: 97.5 F (36.4 C) (10/30 0355) Temp src: Oral (10/30 0355) BP: 97/60 mmHg (10/30 0500)  Labs:  Basename 04/29/12 0455 04/28/12 0927 04/28/12 0100 04/28/12 0035 04/27/12 1033 04/27/12 0615  HGB 13.1 -- 13.4 -- -- --  HCT 40.5 -- 40.5 -- -- 39.2  PLT 286 -- 289 -- -- 279  APTT -- -- -- -- -- --  LABPROT -- -- -- -- -- --  INR -- -- -- -- -- --  HEPARINUNFRC 0.96* 0.67 -- 0.89* -- --  CREATININE -- -- 0.85 -- 0.76 0.78  CKTOTAL -- -- -- -- -- --  CKMB -- -- -- -- -- --  TROPONINI -- -- -- -- -- --    Estimated Creatinine Clearance: 49.4 ml/min (by C-G formula based on Cr of 0.85).  Assessment:  Heparin level is above-goal (0.96) on 1600 units/hr. Not on Coumadin prior to admission due to hx AVMs and GIB, along with baseline dementia.  Goal of Therapy:  Heparin level 0.3-0.7 units/ml Monitor platelets by anticoagulation protocol: Yes   Plan:  1. Decrease IV heparin to 1450 units/hr.  2. Heparin level in 8 hours.   [name] 04/29/2012,6:07 AM

## 2012-04-29 NOTE — Progress Notes (Signed)
Occupational Therapy Treatment Patient Details Name: [name] MRN: [id number] DOB: 1930-05-19 Today's Date: 04/29/2012 Time: 9629-5284 OT Time Calculation (min): 24 min  OT Assessment / Plan / Recommendation Comments on Treatment Session Pt very willing to work with OT on self care.  Improvement noted in standing balance and tolerance and overall endurance.    Follow Up Recommendations  Skilled nursing facility    Barriers to Discharge       Equipment Recommendations  None recommended by OT    Recommendations for Other Services    Frequency Min 2X/week   Plan Discharge plan remains appropriate    Precautions / Restrictions Precautions Precautions: Fall Restrictions Weight Bearing Restrictions: No   Pertinent Vitals/Pain No pain.    ADL  Grooming: Performed;Brushing hair;Wash/dry hands;Set up;Minimal assistance (set up for combing hair in sitting, min assist for standing) Where Assessed - Grooming: Supported standing;Supported sitting Lower Body Dressing: Performed;Moderate assistance Where Assessed - Lower Body Dressing: Unsupported sitting Equipment Used: Gait belt Transfers/Ambulation Related to ADLs: did not ambulate with pt in absence of second person, pulled chair in from of sink for grooming ADL Comments: 02 sats remained in upper 80s with ADL in standing and sitting, pt on 6L    OT Diagnosis:    OT Problem List:   OT Treatment Interventions:     OT Goals Acute Rehab OT Goals OT Goal Formulation: With patient Time For Goal Achievement: 05/11/12 Potential to Achieve Goals: Good ADL Goals Pt Will Perform Grooming: with min assist;Standing at sink ADL Goal: Grooming - Progress: Partly met (increase tolerance to 3 minutes and at least 2 tasks ) Pt Will Perform Lower Body Bathing: with min assist;Standing at sink;Sitting at sink;Sit to stand from chair Pt Will Perform Lower Body Dressing: with min assist;Sit to stand from chair ADL Goal: Lower Body Dressing  - Progress: Progressing toward goals Pt Will Perform Tub/Shower Transfer: Shower transfer;with min assist;Shower seat with back Additional ADL Goal #1: Pt will complete all aspects of toileting on comfort heigh commode with s.  Visit Information  Last OT Received On: 04/29/12 Assistance Needed: +2 (+1 for transfer)    Subjective Data      Prior Functioning       Cognition  Overall Cognitive Status: Impaired Area of Impairment: Safety/judgement;Awareness of deficits;Following commands;Attention;Problem solving Arousal/Alertness: Awake/alert Orientation Level: Appears intact for tasks assessed Behavior During Session: [facility] for tasks performed Awareness of Deficits: pt is aware she will need further rehab prior to return home. Problem Solving: pt requiring verbal cues to use telephone even with multiple attempts.    Mobility  Shoulder Instructions Bed Mobility Bed Mobility: Not assessed Transfers Transfers: Sit to Stand;Stand to Sit Sit to Stand: 4: Min assist;With upper extremity assist;From chair/3-in-1;Other (comment) (one hand on sink, one on arm of chair) Stand to Sit: 4: Min assist;To chair/3-in-1 Details for Transfer Assistance: Stood x 1.5 minutes, knees wobbly.       Exercises      Balance Balance Balance Assessed: Yes Static Sitting Balance Static Sitting - Balance Support: Feet supported Static Sitting - Level of Assistance: 5: Stand by assistance Static Standing Balance Static Standing - Balance Support: No upper extremity supported Static Standing - Level of Assistance: 4: Min assist Static Standing - Comment/# of Minutes: 1.5   End of Session OT - End of Session Activity Tolerance: Patient tolerated treatment well Patient left: in chair;with call bell/phone within reach;Other (comment) (set up for lunch) Nurse Communication: Other (comment) (activity tolerance)  GO  Evern Bio 04/29/2012, 1:10 PM [phone]

## 2012-04-29 NOTE — Progress Notes (Signed)
Patient ID: [name], female   DOB: 1930-01-05, 78 y.o.   MRN: [id number] Subjective:  Stable overnight, dyspnea improved  Objective:  Vital Signs in the last 24 hours: Temp:  [97.5 F (36.4 C)-98.6 F (37 C)] 97.5 F (36.4 C) (10/30 0355) Pulse Rate:  [87-92] 92  (10/29 1708) Resp:  [17-18] 17  (10/30 0300) BP: (91-118)/(51-68) 97/60 mmHg (10/30 0500) SpO2:  [88 %-97 %] 91 % (10/30 0800) FiO2 (%):  [40 %-44 %] 40 % (10/29 1604) Weight:  [142 lb 3.2 oz (64.5 kg)] 142 lb 3.2 oz (64.5 kg) (10/30 0500)  Intake/Output from previous day: 10/29 0701 - 10/30 0700 In: 1044.9 [P.O.:260; I.V.:626.9; IV Piggyback:158] Out: 2152 [Urine:2150; Stool:2] Intake/Output from this shift:    Physical Exam: Well appearing elderly woman, NAD HEENT: Unremarkable Neck:  No JVD, no thyromegally Lungs:  Clear with no wheezes HEART:  Regular rate rhythm, no murmurs, no rubs, no clicks Abd:  soft, positive bowel sounds, no organomegally, no rebound, no guarding Ext:  2 plus pulses, no edema, no cyanosis, no clubbing Skin:  No rashes no nodules Neuro:  CN II through XII intact, motor grossly intact  Lab Results:  Basename 04/29/12 0455 04/28/12 0100  WBC 10.8* 13.4*  HGB 13.1 13.4  PLT 286 289    Basename 04/29/12 0455 04/28/12 0100  NA 138 139  K 3.7 3.8  CL 92* 89*  CO2 42* 42*  GLUCOSE 68* 123*  BUN 25* 33*  CREATININE 0.83 0.85   No results found for this basename: TROPONINI:2,CK,MB:2 in the last 72 hours Hepatic Function Panel No results found for this basename: PROT,ALBUMIN,AST,ALT,ALKPHOS,BILITOT,BILIDIR,IBILI in the last 72 hours No results found for this basename: CHOL in the last 72 hours No results found for this basename: PROTIME in the last 72 hours  Imaging: No results found.  Cardiac Studies: Tele - atrial fib with a controlled VR Assessment/Plan:  1. Atrial fib - rate controlled reasonably well. No change in meds recommended.  2. Acute on chronic diastolic CHF -  improved.  3. Acute on chronic respiratory failure - improved.   LOS: 8 days    Gregg Taylor,M.D. 04/29/2012, 8:04 AM

## 2012-04-29 NOTE — Progress Notes (Signed)
Reviewed MD's note (Dr. Bjorn Loser) & note stated no BIPAP for now. Called to verify this with CCM (order states BIPAP QHS) MD stated that BIPAP was not needed tonight as long as the pt. Wasn't in distress. Pt. Is currently on 5L [place] & all vitals are within normal limits. RN is aware of this.

## 2012-04-29 NOTE — Progress Notes (Signed)
Pt does not wish to wear Bipap tonight. Pt stated her breathing was fine last night without it and she did not have any shortness of breath. Pt will stay on [place] for the evening. SpO2 WNL. PT in no distress at this time. At bedside if needed at a later time.

## 2012-04-29 NOTE — Progress Notes (Signed)
Physical Therapy Treatment Patient Details Name: [name] MRN: [id number] DOB: 07-27-1929 Today's Date: 04/29/2012 Time: 4098-1191 PT Time Calculation (min): 24 min  PT Assessment / Plan / Recommendation Comments on Treatment Session  Pt. presented eager to get to EOB and to ambulate with RW. Pt. presents to be following 1-step commands better than in previous sessions. Pt. needeed VCs for proper handplacement for sit<>stand and for using the RW. Pt encouraged to keep her feet closer together when in static stance and to stay close to her RW while ambulating. Pt. presents with tendency to flop back in her chair once she feels it on the backs of her LE's and not reach for armrests first. Pt. encouraged to resch for the armrests for her safety. +2(A) needed for maintenence of pt's lines with no tele-box available at time of PT session.      Follow Up Recommendations  Home health PT;Supervision - Intermittent     Does the patient have the potential to tolerate intense rehabilitation     Barriers to Discharge        Equipment Recommendations  None recommended by OT    Recommendations for Other Services OT consult  Frequency Min 3X/week   Plan      Precautions / Restrictions Precautions Precautions: Fall Restrictions Weight Bearing Restrictions: No   Pertinent Vitals/Pain Pt. Did not rate any pain levels. O2 sats on 5L O2 from wall hook-up ranged from 89-low90s at rest and with activity.    Mobility  Bed Mobility Bed Mobility: Not assessed Supine to Sit: 5: Supervision Sitting - Scoot to Edge of Bed: 5: Supervision Details for Bed Mobility Assistance: Pt. able to get to sitting and move to the EOB with minimal repitition of instruction from SPTA. Pt. did present to need mod VC's to keep her hips squared and both LEs on floor. Transfers Transfers: Sit to Stand;Stand to Sit Sit to Stand: 4: Min assist;With upper extremity assist;From chair/3-in-1;Other (comment) (one hand on  sink, one on arm of chair) Stand to Sit: 4: Min assist;To chair/3-in-1 Details for Transfer Assistance: Stood x 1.5 minutes, knees wobbly. Ambulation/Gait Ambulation/Gait Assistance: 4: Min assist Ambulation Distance (Feet): 20 Feet Assistive device: Rolling walker Ambulation/Gait Assistance Details: +2 (A) for control of lines. Pt stable with one therapist (A)ing her. No portable tele-box available at time of treat, O2 wall hook-up extension used in room.  Gait Pattern: Step-through pattern;Decreased stride length;Shuffle;Narrow base of support Gait velocity: Slow General Gait Details: Pt. needed VC's for keeping her Rt LE planted under her while standing. Pt. encouraged to stay close within the RW. Stairs: No Corporate treasurer: No    Exercises General Exercises - Lower Extremity Long Arc Quad: AROM;Both;10 reps;Seated Hip Flexion/Marching: AROM;10 reps;Standing;Both;Seated     PT Goals Acute Rehab PT Goals PT Goal Formulation: With patient Time For Goal Achievement: 05/09/12 Potential to Achieve Goals: Good Pt will go Supine/Side to Sit: with modified independence PT Goal: Supine/Side to Sit - Progress: Progressing toward goal Pt will go Sit to Supine/Side: with modified independence Pt will go Sit to Stand: with modified independence PT Goal: Sit to Stand - Progress: Progressing toward goal Pt will go Stand to Sit: with modified independence PT Goal: Stand to Sit - Progress: Progressing toward goal Pt will Ambulate: >150 feet;with modified independence PT Goal: Ambulate - Progress: Progressing toward goal Pt will Go Up / Down Stairs: 3-5 stairs;with rail(s);with modified independence Pt will Perform Home Exercise Program: Independently  Visit Information  Last PT Received On: 04/29/12 Assistance Needed: +2 (+1 for transfer)    Subjective Data  Subjective: "Im doing good today" Patient Stated Goal: return home   Cognition  Overall Cognitive  Status: Impaired Area of Impairment: Safety/judgement;Awareness of deficits;Following commands;Attention;Problem solving Arousal/Alertness: Awake/alert Orientation Level: Appears intact for tasks assessed Behavior During Session: [facility], In for tasks performed Awareness of Deficits: pt is aware she will need further rehab prior to return home. Problem Solving: pt requiring verbal cues to use telephone even with multiple attempts. Cognition - Other Comments: Pt. presented to follow 1-step commands better today than in past sessions.     Balance  Balance Balance Assessed: Yes Static Sitting Balance Static Sitting - Balance Support: Feet supported Static Sitting - Level of Assistance: 5: Stand by assistance Static Standing Balance Static Standing - Balance Support: No upper extremity supported Static Standing - Level of Assistance: 4: Min assist Static Standing - Comment/# of Minutes: 1.5  End of Session PT - End of Session Equipment Utilized During Treatment: Gait belt;Oxygen ( O2 at 5L via wall hook-up) Activity Tolerance: Patient tolerated treatment well Patient left: in chair;with call bell/phone within reach (Nurse tech present) Nurse Communication: Mobility status    STROUD, LAURA 04/29/2012, 1:20 PM   [name], PTA [phone] 05/01/2012

## 2012-04-29 NOTE — Progress Notes (Signed)
Name: [name] MRN: [id number] DOB: Oct 28, 1929    LOS: 8 Date of admit 04/21/2012   Referring Provider:  Dr Antoine Poche cards Reason for Referral:  Acute Respiratory failure  PULMONARY / CRITICAL CARE MEDICINE  HPI:  78 year old COPD NOS, mild dementia per chart (cognitively with capacity on 04/23/12), AVM colon hx, DM-2, PAF, osteoporosis,   patient with diastolic chf and hx of intubation in June 2010 (based on chart review) for RESISTANT ACINETOBACTER lower lobe pneumonia  ? Following ankle fracture then. Admitted with resp distress on 04/21/2012. Being treated for acute diastolic chf but without improvement despite 1400cc negative. On 04/23/12 AM noted to have decompensation with resp status overnight and needing 50% face mask and RR 24-30 but speaking full sentences. CXR c/w bilateral infiltrates. PCCM consulted 04/23/12. Patient admitted as full code but after discussion with CCM patient NO CPR but full medical care and desiring short term ventilation only (no LTAC, no trach)  Note:  Last admit to [place] was 2010 but had one admit interim to [place], [place] DEVICES bipap 04/23/12 >> Foley ? 04/21/2012 >>  MICRO Results for orders placed during the hospital encounter of 04/21/12  MRSA PCR SCREENING     Status: Normal   Collection Time   04/21/12  5:25 PM      Component Value Range Status Comment   MRSA by PCR NEGATIVE  NEGATIVE Final     ANTIBIOTICs Anti-infectives     Start     Dose/Rate Route Frequency Ordered Stop   04/24/12 1400   cefTAZidime (FORTAZ) 1 g in dextrose 5 % 50 mL IVPB        1 g 100 mL/hr over 30 Minutes Intravenous 3 times per day 04/24/12 1256     04/23/12 1700   levofloxacin (LEVAQUIN) IVPB 750 mg  Status:  Discontinued        750 mg 100 mL/hr over 90 Minutes Intravenous Every 24 hours 04/22/12 1639 04/24/12 1256   04/23/12 1400   piperacillin-tazobactam (ZOSYN) IVPB 3.375 g  Status:  Discontinued        3.375 g 100 mL/hr over 30 Minutes  Intravenous 3 times per day 04/23/12 1058 04/24/12 1256   04/23/12 1200   vancomycin (VANCOCIN) IVPB 1000 mg/200 mL premix  Status:  Discontinued        1,000 mg 200 mL/hr over 60 Minutes Intravenous Every 24 hours 04/23/12 1057 04/24/12 1256   04/22/12 1630   levofloxacin (LEVAQUIN) IVPB 750 mg  Status:  Discontinued        750 mg 100 mL/hr over 90 Minutes Intravenous Every 24 hours 04/22/12 1618 04/22/12 1639         Events Since Admission: 04/21/2012 12:34 PM >>  Subjective: BiPAP  last 24 hours, currently comfortable on 6L/min [place]  Vital Signs: Temp:  [97.4 F (36.3 C)-98.6 F (37 C)] 98 F (36.7 C) (10/30 1200) Pulse Rate:  [87-116] 116  (10/30 1200) Resp:  [17-21] 20  (10/30 1200) BP: (91-117)/(51-68) 113/51 mmHg (10/30 1200) SpO2:  [91 %-97 %] 94 % (10/30 1200) FiO2 (%):  [40 %] 40 % (10/29 1604) Weight:  [64.5 kg (142 lb 3.2 oz)] 64.5 kg (142 lb 3.2 oz) (10/30 0500)  Intake/Output Summary (Last 24 hours) at 04/29/12 1245 Last data filed at 04/29/12 1030  Gross per 24 hour  Intake 846.88 ml  Output   1827 ml  Net -980.12 ml    Physical Examination: General:  Frail eldeerly female Neuro:  Alert and Oriented x 3. RASS +1. GCS 15. Moves all 4 ext HEENT:  No lesions  Neck:  Supple Cardiovascular:  Regular, 90's. No murmurs Lungs:  RR 34, Acc muscle +,. Full sentences + Abdomen:  Soft, non tender Musculoskeletal:  Cachectic, No edema, No cyanosis Skin:  Intact   Dg Chest 2 View  04/22/2012  *RADIOLOGY REPORT*  Clinical Data: Follow up possible pneumonia  CHEST - 2 VIEW  Comparison: 04/21/2012  Findings: Multifocal patchy airspace opacities, stable versus mildly increased, possibly reflecting multifocal pneumonia or asymmetric interstitial edema.  Small left pleural effusion.  No pneumothorax.  The heart is top normal in size.  Degenerative changes of the visualized thoracolumbar spine.  IMPRESSION: Multifocal patchy airspace opacities, stable versus mildly  increased, possibly reflecting multifocal pneumonia or asymmetric interstitial edema.  Small left pleural effusion.   Original Report Authenticated By: Charline Bills, M.D.      Principal Problem:  *Acute and chronic respiratory failure Active Problems:  HYPERCHOLESTEROLEMIA  TOBACCO USE  HYPERTENSION  Atrial fibrillation  DIABETES MELLITUS, TYPE II  Dementia  Acute diastolic CHF (congestive heart failure)  COPD (chronic obstructive pulmonary disease)   ASSESSMENT AND PLAN  PULMONARY  Lab 04/28/12 1200 04/24/12 0930 04/23/12 1324  PHART 7.471* 7.399 7.419  PCO2ART 71.2* 63.2* 61.0*  PO2ART 49.0* 75.2* 88.0  HCO3 52.0* 38.3* 39.4*  O2SAT 84.0 94.9 96.0   Ventilator Settings: Vent Mode:  [-]  FiO2 (%):  [40 %] 40 %  A:  Acute on chronic resp failure to COPD, CHF +/- PNA. Clinical picture, WBC, Pct argue against PNA.  Spoke with the patient extensively and record reviewed.  Hypoxemia is likely a combination of PNA and COPD with pulmonary edema as a contributing but not major factor.  The interesting part is the patient desats at night more than during the day and falls asleep easily during the day.  OSA is likely a major contributing factor here.  More confused this AM. P:   Continue BiPAP while asleep. Continue to hold lasix and give additional doses of acetazolamide. Covered empirically for HCAP 10/24, reduced to fortaz alone on 10/25 with no evidence of fever, WBC improved, would finish a total abx days of 8 days today is 7 out of 8. Xopenex + Atrovent + Pulmicort as ordered. CSW to arrange for home O2 and home BiPAP.  CARDIOVASCULAR  Lab 04/27/12 0615  TROPONINI --  LATICACIDVEN --  PROBNP 1311.0*   A: Acute diastolic CHF A fib w controlled rate P:  D/C Lasix. Diltiazem to po. ASA. Atorvastatin.  RENAL  Lab 04/29/12 0455 04/28/12 0100 04/27/12 1033 04/27/12 0615 04/26/12 0540  NA 138 139 137 140 138  K 3.7 3.8 -- -- --  CL 92* 89* 89* 91* 88*  CO2 42*  42* 43* 42* 44*  BUN 25* 33* 32* 29* 32*  CREATININE 0.83 0.85 0.76 0.78 0.76  CALCIUM 11.2* 10.1 9.4 9.3 9.5  MG -- -- -- -- --  PHOS -- -- -- -- --   Intake/Output      10/29 0701 - 10/30 0700 10/30 0701 - 10/31 0700   P.O. 260    I.V. (mL/kg) 626.9 (9.7)    IV Piggyback 158    Total Intake(mL/kg) 1044.9 (16.2)    Urine (mL/kg/hr) 2150 (1.4) 250 (0.7)   Stool 2    Total Output 2152 250   Net -1107.1 -250          Intake/Output Summary (Last 24 hours) at  04/29/12 1248 Last data filed at 04/29/12 1030  Gross per 24 hour  Intake 846.88 ml  Output   1827 ml  Net -980.12 ml   A:  Hypokalemia P:   - D/C lasix. - Acetazolamide as ordered. - Replace K.  GASTROINTESTINAL No results found for this basename: AST:5,ALT:5,ALKPHOS:5,BILITOT:5,PROT:5,ALBUMIN:5 in the last 168 hours  A:  nutrition P:  May start diet from a pulmonary standpoint.  HEMATOLOGIC  Lab 04/29/12 0455 04/28/12 0100 04/27/12 0615 04/26/12 0540 04/25/12 0520  HGB 13.1 13.4 13.0 12.9 12.8  HCT 40.5 40.5 39.2 40.3 39.7  PLT 286 289 279 296 268  INR -- -- -- -- --  APTT -- -- -- -- --   A:  AT risk for anemia criticl illness P:  - PRBC for hgb </= 6.9gm%    INFECTIOUS  Lab 04/29/12 0455 04/28/12 0100 04/27/12 0615 04/26/12 0540 04/25/12 0520 04/24/12 0440 04/23/12 1144  WBC 10.8* 13.4* 12.5* 13.6* 11.0* -- --  PROCALCITON -- -- -- -- 0.26 0.47 0.15   Cultures: Results for orders placed during the hospital encounter of 04/21/12  MRSA PCR SCREENING     Status: Normal   Collection Time   04/21/12  5:25 PM      Component Value Range Status Comment   MRSA by PCR NEGATIVE  NEGATIVE Final     Antibiotics: Anti-infectives     Start     Dose/Rate Route Frequency Ordered Stop   04/24/12 1400   cefTAZidime (FORTAZ) 1 g in dextrose 5 % 50 mL IVPB        1 g 100 mL/hr over 30 Minutes Intravenous 3 times per day 04/24/12 1256     04/23/12 1700   levofloxacin (LEVAQUIN) IVPB 750 mg  Status:   Discontinued        750 mg 100 mL/hr over 90 Minutes Intravenous Every 24 hours 04/22/12 1639 04/24/12 1256   04/23/12 1400   piperacillin-tazobactam (ZOSYN) IVPB 3.375 g  Status:  Discontinued        3.375 g 100 mL/hr over 30 Minutes Intravenous 3 times per day 04/23/12 1058 04/24/12 1256   04/23/12 1200   vancomycin (VANCOCIN) IVPB 1000 mg/200 mL premix  Status:  Discontinued        1,000 mg 200 mL/hr over 60 Minutes Intravenous Every 24 hours 04/23/12 1057 04/24/12 1256   04/22/12 1630   levofloxacin (LEVAQUIN) IVPB 750 mg  Status:  Discontinued        750 mg 100 mL/hr over 90 Minutes Intravenous Every 24 hours 04/22/12 1618 04/22/12 1639         A:  ? HCAP - Pct, WBC, clinical hx argue against. Has a hx of resistant acinitobacter from 2010 P:   - Cultures sent on 1028 and are pending. - Prior cx's acinitobacter resistant to zosyn (high MIC) and levaquin, intermediate to ceftaz, ceftriaxone, tobra. Will continue fortaz for now for a total of 8 days of abx today is day 7 out of 8.  ENDOCRINE  Lab 04/29/12 1222 04/29/12 0758 04/28/12 2132 04/28/12 1706 04/28/12 1246  GLUCAP 89 131* 100* 107* 112*   A:  Hyperglycemia, hx DM P:   ssi  NEUROLOGIC  A:  Intact P:   Monitor  Alyson Reedy, M.D. [facility] Pulmonary/Critical Care Medicine. Pager: [phone]. After hours pager: [phone].

## 2012-04-29 NOTE — Clinical Social Work Psychosocial (Signed)
     Clinical Social Work Department BRIEF PSYCHOSOCIAL ASSESSMENT 04/29/2012  Patient:  [name], [name]     Account Number:  [id number]     Admit date:  04/21/2012  Clinical Social Worker:  Hulan Fray  Date/Time:  04/29/2012 10:16 AM  Referred by:  Care Management  Date Referred:  04/28/2012 Referred for  Other - See comment   Other Referral:   SNF placement   Interview type:  Family Other interview type:   Son- [name]    PSYCHOSOCIAL DATA Living Status:  ALONE Admitted from facility:   Level of care:   Primary support name:  [name] Primary support relationship to patient:  CHILD, ADULT Degree of support available:   supportive    CURRENT CONCERNS Current Concerns  Post-Acute Placement   Other Concerns:    SOCIAL WORK ASSESSMENT / PLAN Clinical Social Worker received referral for possible SNF placement from CM. CSW reviewed chart and noticed patient's confusion. CSW called son, Lauren  and introduced self and explained reason for phone call, to discuss referral for SNF placement. Per Lauren, the patient has been placed in SNF previously in 2010 after breaking her ankle and acquiring pnuemonia. Per Lauren the patient was placed at [facility] and enjoyed her stay there. Lauren also reported that the patient was interested in [facility] due to having more family members living closer that the facility and visiting the facility when family and friends were placed there. Son was agreeable for CSW to intiate a broad search in [place].  as well. CSW will complete FL2 for MD's signature and continue to follow.   Assessment/plan status:  Psychosocial Support/Ongoing Assessment of Needs Other assessment/ plan:   Information/referral to community resources:   SNF packet is placed in patient's room for son to review.    PATIENTS/FAMILYS RESPONSE TO PLAN OF CARE: Son, [name] is agreeable to placement if needed and agreeable for  CSW to initiate SNF search in [place]. Lauren was appreciative of CSW's concern and assistance with placement.

## 2012-04-29 NOTE — Progress Notes (Signed)
ANTICOAGULATION CONSULT NOTE - Follow Up Consult  Pharmacy Consult for Heparin Indication: atrial fibrillation  Allergies  Allergen Reactions  . Azithromycin     REACTION: inc blood sugar  . Codeine Itching  . Coumadin (Warfarin) Other (See Comments)    Causes bleeding  . Oxybutynin Chloride Er     Constipation/ dizziness/ dry mouth    Patient Measurements: Height: 5\' 6"  (167.6 cm) Weight: 142 lb 3.2 oz (64.5 kg) IBW/kg (Calculated) : 59.3  Heparin Dosing Weight: 66 kg  Vital Signs: Temp: 98 F (36.7 C) (10/30 1200) Temp src: Oral (10/30 1200) BP: 113/51 mmHg (10/30 1200) Pulse Rate: 116  (10/30 1200)  Labs:  Basename 04/29/12 1325 04/29/12 0455 04/28/12 0927 04/28/12 0100 04/27/12 1033 04/27/12 0615  HGB -- 13.1 -- 13.4 -- --  HCT -- 40.5 -- 40.5 -- 39.2  PLT -- 286 -- 289 -- 279  APTT -- -- -- -- -- --  LABPROT -- -- -- -- -- --  INR -- -- -- -- -- --  HEPARINUNFRC 0.61 0.96* 0.67 -- -- --  CREATININE -- 0.83 -- 0.85 0.76 --  CKTOTAL -- -- -- -- -- --  CKMB -- -- -- -- -- --  TROPONINI -- -- -- -- -- --    Estimated Creatinine Clearance: 50.6 ml/min (by C-G formula based on Cr of 0.83).  Assessment:  Heparin level is back into therapeutic range on 1450 units/hr.  CBC stable.  Day # 9 IV heparin, since 10/22.  Not on Coumadin prior to admission due to hx AVMs and GIB, along with baseline dementia.  Goal of Therapy:  Heparin level 0.3-0.7 units/ml Monitor platelets by anticoagulation protocol: Yes   Plan:   Continue heparin drip at 1450 units/hr.  Continue daily heparin level and CBC.  Will follow up anticoag plans.    Dennie Fetters, RPh Pager: [phone] 04/29/2012,2:55 PM

## 2012-04-30 ENCOUNTER — Inpatient Hospital Stay (HOSPITAL_COMMUNITY): Payer: PRIVATE HEALTH INSURANCE

## 2012-04-30 LAB — POCT I-STAT 3, ART BLOOD GAS (G3+)
Acid-Base Excess: 12 mmol/L — ABNORMAL HIGH (ref 0.0–2.0)
pCO2 arterial: 63.1 mmHg (ref 35.0–45.0)
pO2, Arterial: 68 mmHg — ABNORMAL LOW (ref 80.0–100.0)

## 2012-04-30 LAB — BASIC METABOLIC PANEL
CO2: 38 mEq/L — ABNORMAL HIGH (ref 19–32)
Chloride: 98 mEq/L (ref 96–112)
Creatinine, Ser: 0.82 mg/dL (ref 0.50–1.10)
GFR calc non Af Amer: 66 mL/min — ABNORMAL LOW (ref >90)
Sodium: 142 mEq/L (ref 135–145)

## 2012-04-30 LAB — GLUCOSE, CAPILLARY
Glucose-Capillary: 59 mg/dL — ABNORMAL LOW (ref 70–99)
Glucose-Capillary: 222 mg/dL — ABNORMAL HIGH (ref 70–99)
Glucose-Capillary: 135 mg/dL — ABNORMAL HIGH (ref 70–99)

## 2012-04-30 LAB — CBC
Platelets: 299 10*3/uL (ref 150–400)
RBC: 4.31 MIL/uL (ref 3.87–5.11)
RDW: 12.8 % (ref 11.5–15.5)
WBC: 10.4 10*3/uL (ref 4.0–10.5)

## 2012-04-30 LAB — PHOSPHORUS: Phosphorus: 3.7 mg/dL (ref 2.3–4.6)

## 2012-04-30 MED ORDER — DEXTROSE 50 % IV SOLN
INTRAVENOUS | Status: AC
  Administered 2012-04-30: 25 mL
  Filled 2012-04-30: qty 50

## 2012-04-30 MED ORDER — LEVALBUTEROL HCL 0.63 MG/3ML IN NEBU
0.6300 mg | INHALATION_SOLUTION | Freq: Four times a day (QID) | RESPIRATORY_TRACT | Status: DC
  Administered 2012-05-01 – 2012-05-05 (×17): 0.63 mg via RESPIRATORY_TRACT
  Filled 2012-04-30 (×21): qty 3

## 2012-04-30 NOTE — Progress Notes (Signed)
Pt placed on Bipap due to decreased in responsiveness. MD made aware and RN aware. PT tolerating Bipap at this time.

## 2012-04-30 NOTE — Progress Notes (Signed)
Name: [name] MRN: [id number] DOB: 08-Oct-1929    LOS: 9 Date of admit 04/21/2012   Referring Provider:  Dr Antoine Poche cards Reason for Referral:  Acute Respiratory failure  PULMONARY / CRITICAL CARE MEDICINE  HPI:  78 year old COPD NOS, mild dementia per chart (cognitively with capacity on 04/23/12), AVM colon hx, DM-2, PAF, osteoporosis,   patient with diastolic chf and hx of intubation in June 2010 (based on chart review) for RESISTANT ACINETOBACTER lower lobe pneumonia  ? Following ankle fracture then. Admitted with resp distress on 04/21/2012. Being treated for acute diastolic chf but without improvement despite 1400cc negative. On 04/23/12 AM noted to have decompensation with resp status overnight and needing 50% face mask and RR 24-30 but speaking full sentences. CXR c/w bilateral infiltrates. PCCM consulted 04/23/12. Patient admitted as full code but after discussion with CCM patient NO CPR but full medical care and desiring short term ventilation only (no LTAC, no trach)  Note:  Last admit to [place] was 2010 but had one admit interim to [place], [place] DEVICES bipap 04/23/12 >> Foley ? 04/21/2012 >>  MICRO Results for orders placed during the hospital encounter of 04/21/12  MRSA PCR SCREENING     Status: Normal   Collection Time   04/21/12  5:25 PM      Component Value Range Status Comment   MRSA by PCR NEGATIVE  NEGATIVE Final     ANTIBIOTICs Anti-infectives     Start     Dose/Rate Route Frequency Ordered Stop   04/24/12 1400   cefTAZidime (FORTAZ) 1 g in dextrose 5 % 50 mL IVPB        1 g 100 mL/hr over 30 Minutes Intravenous 3 times per day 04/24/12 1256     04/23/12 1700   levofloxacin (LEVAQUIN) IVPB 750 mg  Status:  Discontinued        750 mg 100 mL/hr over 90 Minutes Intravenous Every 24 hours 04/22/12 1639 04/24/12 1256   04/23/12 1400   piperacillin-tazobactam (ZOSYN) IVPB 3.375 g  Status:  Discontinued        3.375 g 100 mL/hr over 30 Minutes  Intravenous 3 times per day 04/23/12 1058 04/24/12 1256   04/23/12 1200   vancomycin (VANCOCIN) IVPB 1000 mg/200 mL premix  Status:  Discontinued        1,000 mg 200 mL/hr over 60 Minutes Intravenous Every 24 hours 04/23/12 1057 04/24/12 1256   04/22/12 1630   levofloxacin (LEVAQUIN) IVPB 750 mg  Status:  Discontinued        750 mg 100 mL/hr over 90 Minutes Intravenous Every 24 hours 04/22/12 1618 04/22/12 1639         Events Since Admission: 04/21/2012 12:34 PM >>  Subjective: BiPAP  last 24 hours, currently comfortable on 6L/min [place]  Vital Signs: Temp:  [97 F (36.1 C)-99.1 F (37.3 C)] 98.2 F (36.8 C) (10/31 0838) Pulse Rate:  [68-116] 70  (10/31 1030) Resp:  [19-21] 19  (10/31 1030) BP: (92-181)/(51-151) 108/65 mmHg (10/31 1030) SpO2:  [81 %-97 %] 92 % (10/31 1030) Weight:  [64.4 kg (141 lb 15.6 oz)] 64.4 kg (141 lb 15.6 oz) (10/31 0500)  Intake/Output Summary (Last 24 hours) at 04/30/12 1049 Last data filed at 04/30/12 1000  Gross per 24 hour  Intake  943.5 ml  Output      0 ml  Net  943.5 ml    Physical Examination: General:  Frail eldeerly female Neuro:  Alert and Oriented x  3. RASS +1. GCS 15. Moves all 4 ext HEENT:  No lesions  Neck:  Supple Cardiovascular:  Regular, 90's. No murmurs Lungs:  RR 34, Acc muscle +,. Full sentences + Abdomen:  Soft, non tender Musculoskeletal:  Cachectic, No edema, No cyanosis Skin:  Intact   Dg Chest 2 View  04/22/2012  *RADIOLOGY REPORT*  Clinical Data: Follow up possible pneumonia  CHEST - 2 VIEW  Comparison: 04/21/2012  Findings: Multifocal patchy airspace opacities, stable versus mildly increased, possibly reflecting multifocal pneumonia or asymmetric interstitial edema.  Small left pleural effusion.  No pneumothorax.  The heart is top normal in size.  Degenerative changes of the visualized thoracolumbar spine.  IMPRESSION: Multifocal patchy airspace opacities, stable versus mildly increased, possibly reflecting multifocal  pneumonia or asymmetric interstitial edema.  Small left pleural effusion.   Original Report Authenticated By: Charline Bills, M.D.      Principal Problem:  *Acute and chronic respiratory failure Active Problems:  HYPERCHOLESTEROLEMIA  TOBACCO USE  HYPERTENSION  Atrial fibrillation  DIABETES MELLITUS, TYPE II  Dementia  Acute diastolic CHF (congestive heart failure)  COPD (chronic obstructive pulmonary disease)   ASSESSMENT AND PLAN  PULMONARY  Lab 04/30/12 0822 04/28/12 1200 04/24/12 0930 04/23/12 1324  PHART 7.405 7.471* 7.399 7.419  PCO2ART 63.1* 71.2* 63.2* 61.0*  PO2ART 68.0* 49.0* 75.2* 88.0  HCO3 39.5* 52.0* 38.3* 39.4*  O2SAT 93.0 84.0 94.9 96.0   Ventilator Settings:    A:  Acute on chronic resp failure to COPD, CHF +/- PNA. Clinical picture, WBC, Pct argue against PNA.  Spoke with the patient extensively and record reviewed.  Hypoxemia is likely a combination of PNA and COPD with pulmonary edema as a contributing but not major factor.  The interesting part is the patient desats at night more than during the day and falls asleep easily during the day.  OSA is likely a major contributing factor here.  More confused this AM and ?of airway protection. P:   - Hold BiPAP for now given inability to protect airway, restart only if improved mental status and maintain at QHS and PRN only. - Continue to hold lasix and give additional doses of acetazolamide. - Covered empirically for HCAP 10/24, reduced to fortaz alone on 10/25 with no evidence of fever, WBC improved, would finish a total abx days of 8 days today is 8 out of 8, will d/c after today's dose. - Xopenex + Atrovent + Pulmicort as ordered. - CSW to arrange for home O2 and home BiPAP. - Repeat CXR today.  CARDIOVASCULAR  Lab 04/27/12 0615  TROPONINI --  LATICACIDVEN --  PROBNP 1311.0*   A: Acute diastolic CHF A fib w controlled rate P:  D/C Lasix. Diltiazem to po. Diamox again  today. ASA. Atorvastatin.  RENAL  Lab 04/30/12 0440 04/29/12 0455 04/28/12 0100 04/27/12 1033 04/27/12 0615  NA 142 138 139 137 140  K 3.9 3.7 -- -- --  CL 98 92* 89* 89* 91*  CO2 38* 42* 42* 43* 42*  BUN 25* 25* 33* 32* 29*  CREATININE 0.82 0.83 0.85 0.76 0.78  CALCIUM 11.4* 11.2* 10.1 9.4 9.3  MG 2.2 -- -- -- --  PHOS 3.7 -- -- -- --   Intake/Output      10/30 0701 - 10/31 0700 10/31 0701 - 11/01 0700   P.O. 120    I.V. (mL/kg) 588 (9.1) 59 (0.9)   IV Piggyback 250    Total Intake(mL/kg) 958 (14.9) 59 (0.9)   Urine (mL/kg/hr) 250 (  0.2)    Stool     Total Output 250    Net +708 +59        Urine Occurrence 2 x 1 x     Intake/Output Summary (Last 24 hours) at 04/30/12 1049 Last data filed at 04/30/12 1000  Gross per 24 hour  Intake  943.5 ml  Output      0 ml  Net  943.5 ml   A:  Hypokalemia P:   - D/C lasix. - Acetazolamide as ordered. - Replace K.  GASTROINTESTINAL No results found for this basename: AST:5,ALT:5,ALKPHOS:5,BILITOT:5,PROT:5,ALBUMIN:5 in the last 168 hours  A:  nutrition P:  Heart healthy diet  HEMATOLOGIC  Lab 04/30/12 0440 04/29/12 0455 04/28/12 0100 04/27/12 0615 04/26/12 0540  HGB 12.9 13.1 13.4 13.0 12.9  HCT 40.0 40.5 40.5 39.2 40.3  PLT 299 286 289 279 296  INR -- -- -- -- --  APTT -- -- -- -- --   A:  AT risk for anemia criticl illness P:  - PRBC for hgb </= 6.9gm%    INFECTIOUS  Lab 04/30/12 0440 04/29/12 0455 04/28/12 0100 04/27/12 0615 04/26/12 0540 04/25/12 0520 04/24/12 0440 04/23/12 1144  WBC 10.4 10.8* 13.4* 12.5* 13.6* -- -- --  PROCALCITON -- -- -- -- -- 0.26 0.47 0.15   Cultures: Results for orders placed during the hospital encounter of 04/21/12  MRSA PCR SCREENING     Status: Normal   Collection Time   04/21/12  5:25 PM      Component Value Range Status Comment   MRSA by PCR NEGATIVE  NEGATIVE Final     Antibiotics: Anti-infectives     Start     Dose/Rate Route Frequency Ordered Stop   04/24/12 1400    cefTAZidime (FORTAZ) 1 g in dextrose 5 % 50 mL IVPB        1 g 100 mL/hr over 30 Minutes Intravenous 3 times per day 04/24/12 1256     04/23/12 1700   levofloxacin (LEVAQUIN) IVPB 750 mg  Status:  Discontinued        750 mg 100 mL/hr over 90 Minutes Intravenous Every 24 hours 04/22/12 1639 04/24/12 1256   04/23/12 1400   piperacillin-tazobactam (ZOSYN) IVPB 3.375 g  Status:  Discontinued        3.375 g 100 mL/hr over 30 Minutes Intravenous 3 times per day 04/23/12 1058 04/24/12 1256   04/23/12 1200   vancomycin (VANCOCIN) IVPB 1000 mg/200 mL premix  Status:  Discontinued        1,000 mg 200 mL/hr over 60 Minutes Intravenous Every 24 hours 04/23/12 1057 04/24/12 1256   04/22/12 1630   levofloxacin (LEVAQUIN) IVPB 750 mg  Status:  Discontinued        750 mg 100 mL/hr over 90 Minutes Intravenous Every 24 hours 04/22/12 1618 04/22/12 1639         A:  ? HCAP - Pct, WBC, clinical hx argue against. Has a hx of resistant acinitobacter from 2010 P:   - Cultures sent on 1028 and are pending. - Prior cx's acinitobacter resistant to zosyn (high MIC) and levaquin, intermediate to ceftaz, ceftriaxone, tobra. Will continue fortaz for now for a total of 8 days of abx today is day 8 out of 8, will d/c after today's dose.  ENDOCRINE  Lab 04/30/12 0835 04/30/12 0650 04/30/12 0641 04/30/12 0621 04/29/12 2205  GLUCAP 140* 222* 148* 59* 191*   A:  Hyperglycemia, hx DM, persistent hypoglycemia in the morning. P:   -  D/C glyburide. - Continue metformin (does not cause hypoglycemia). - No long acting lantus. - ISS.  NEUROLOGIC  A:  Sporadic deterioration in mental status, cause remains unclear to me.  More likely to be hypoglycemia.  See above.  Exam now is non-focal, no need for head CT.  If persists will call neuro in AM. P:   - Minimize sedation. - Move to the ICU for ? Of airway protection. - D/C glyburide. - Monitor closely.  Will transfer to the ICU for closer monitoring as the exam  this AM does not indicate that the patient can protect her airway and will likely need intubation.  CC time of 35 min.  Alyson Reedy, M.D. [facility] Pulmonary/Critical Care Medicine. Pager: [phone]. After hours pager: [phone].

## 2012-04-30 NOTE — Progress Notes (Signed)
CCM Dr Sung Amabile notified of change in neuro status (please see documented assessment). RN notified that MD will be rounding soon. Will cont to monitor.

## 2012-04-30 NOTE — Progress Notes (Signed)
Pt with CBG=50 @ 21:31 . Was given D50 25 ml as per protocol ; CBG rechecked in 15 mins = 191. Dr Terressa Koyanagi informed. Pt continued to be stable v/s & sats between 90% to 92% @ 5L/min. This morning @ 6:00 AM pt noted lethargic. CBG taken =59. D50 25ml given as per protocol. CBG rechecked after 15 mins=148. Pt. Is responsive but still lethargic. RT called & placed pt on BIPAP. [facility] informed & ordered ABG. Will continue to monitor pt.

## 2012-04-30 NOTE — Progress Notes (Signed)
Patient ID: [name], female   DOB: 06/27/1930, 78 y.o.   MRN: [id number] Subjective:  Less awake this morning on rounds. With physical stimulus, she does awaken and follow simple commands. Objective:  Vital Signs in the last 24 hours: Temp:  [97 F (36.1 C)-99.1 F (37.3 C)] 98.1 F (36.7 C) (10/31 1213) Pulse Rate:  [68-88] 71  (10/31 1300) Resp:  [19-22] 22  (10/31 1300) BP: (92-181)/(51-151) 102/53 mmHg (10/31 1300) SpO2:  [81 %-97 %] 93 % (10/31 1300) Weight:  [141 lb 15.6 oz (64.4 kg)] 141 lb 15.6 oz (64.4 kg) (10/31 0500)  Intake/Output from previous day: 10/30 0701 - 10/31 0700 In: 958 [P.O.:120; I.V.:588; IV Piggyback:250] Out: 250 [Urine:250] Intake/Output from this shift: Total I/O In: 232.5 [P.O.:100; I.V.:132.5] Out: -   Physical Exam: elderly appearing NAD HEENT: Unremarkable Neck:  No JVD, no thyromegally Lungs:  Clear with no wheezes HEART:  Regular rate rhythm, no murmurs, no rubs, no clicks Abd:  soft, positive bowel sounds, no organomegally, no rebound, no guarding Ext:  2 plus pulses, no edema, no cyanosis, no clubbing Skin:  No rashes no nodules Neuro:  CN II through XII intact, motor grossly intact  Lab Results:  Basename 04/30/12 0440 04/29/12 0455  WBC 10.4 10.8*  HGB 12.9 13.1  PLT 299 286    Basename 04/30/12 0440 04/29/12 0455  NA 142 138  K 3.9 3.7  CL 98 92*  CO2 38* 42*  GLUCOSE 53* 68*  BUN 25* 25*  CREATININE 0.82 0.83   No results found for this basename: TROPONINI:2,CK,MB:2 in the last 72 hours Hepatic Function Panel No results found for this basename: PROT,ALBUMIN,AST,ALT,ALKPHOS,BILITOT,BILIDIR,IBILI in the last 72 hours No results found for this basename: CHOL in the last 72 hours No results found for this basename: PROTIME in the last 72 hours  Imaging: Dg Chest Port 1 View  04/30/2012  *RADIOLOGY REPORT*  Clinical Data: Respiratory failure, history of pneumonia  PORTABLE CHEST - 1 VIEW  Comparison: 04/25/2012   Findings: Cardiomegaly again noted.  Persistent small left pleural effusion with left basilar atelectasis or infiltrate.  No pulmonary edema.  Patchy airspace disease in the right lower lobe probable residual pneumonia.  Follow-up to resolution is recommended.  IMPRESSION:  Persistent small left pleural effusion with left basilar atelectasis or infiltrate.  No pulmonary edema.  Patchy airspace disease in the right lower lobe probable residual pneumonia. Follow-up to resolution is recommended.   Original Report Authenticated By: Natasha Mead, M.D.     Cardiac Studies: Tele - atrial fib with a controlled Vr Assessment/Plan:  1. Acute on chronic diastolic CHF - agree with pulm/ccm, she appears if anything overly diuresed. Would hold diuretics for now. 2. Pneumonia 3. Hypoglycemia 4. Atrial fib Rec: hold on diuretic, keep rate controlled,keep glucose and K replete. Will follow.  LOS: 9 days    Shariyah Eland,M.D. 04/30/2012, 1:56 PM

## 2012-04-30 NOTE — Progress Notes (Signed)
ANTICOAGULATION CONSULT NOTE - Follow Up Consult  Pharmacy Consult for Heparin Indication: atrial fibrillation  Allergies  Allergen Reactions  . Azithromycin     REACTION: inc blood sugar  . Codeine Itching  . Coumadin (Warfarin) Other (See Comments)    Causes bleeding  . Oxybutynin Chloride Er     Constipation/ dizziness/ dry mouth    Patient Measurements: Height: 5\' 6"  (167.6 cm) Weight: 141 lb 15.6 oz (64.4 kg) IBW/kg (Calculated) : 59.3  Heparin Dosing Weight: 65 kg  Vital Signs: Temp: 98.1 F (36.7 C) (10/31 1213) Temp src: Oral (10/31 1213) BP: 96/53 mmHg (10/31 1200) Pulse Rate: 77  (10/31 1200)  Labs:  Basename 04/30/12 0440 04/29/12 1325 04/29/12 0455 04/28/12 0100  HGB 12.9 -- 13.1 --  HCT 40.0 -- 40.5 40.5  PLT 299 -- 286 289  APTT -- -- -- --  LABPROT -- -- -- --  INR -- -- -- --  HEPARINUNFRC 0.67 0.61 0.96* --  CREATININE 0.82 -- 0.83 0.85  CKTOTAL -- -- -- --  CKMB -- -- -- --  TROPONINI -- -- -- --    Estimated Creatinine Clearance: 51.2 ml/min (by C-G formula based on Cr of 0.82).  Assessment:  Heparin level remains therapeutic on 1450 units/hr.  CBC stable. No bleeding noted.   Day # 10 IV heparin (since 10/22).  Not on Coumadin prior to admission due to hx AVMs and GIB, along with baseline dementia.  Goal of Therapy:  Heparin level 0.3-0.7 units/ml Monitor platelets by anticoagulation protocol: Yes   Plan:   Continue heparin drip at 1450 units/hr.  Continue daily heparin level and CBC.  Will follow up anticoag plans.    Dennie Fetters, [place] Pager: [phone] 04/30/2012,12:28 PM

## 2012-04-30 NOTE — Progress Notes (Signed)
Dr Molli Knock at bedside. Order received to transfer pt to ICU. Awaiting bed.

## 2012-04-30 NOTE — Progress Notes (Signed)
Inpatient Diabetes Program Recommendations  AACE/ADA: New Consensus Statement on Inpatient Glycemic Control (2013)  Target Ranges:  Prepandial:   less than 140 mg/dL      Peak postprandial:   less than 180 mg/dL (1-2 hours)      Critically ill patients:  140 - 180 mg/dL   Hypoglycemia this morning.  Glucose=53. Inpatient Diabetes Program Recommendations Insulin - Meal Coverage: .... Oral Agents: Discontinue Glyburide during hospitalization  HgbA1C: =7.6 Thank you  [name] [facility] Inpatient Diabetes Coordinator [phone]

## 2012-04-30 NOTE — Progress Notes (Signed)
SLP Cancellation Note  Unable to complete SLP evaluation/treatment secondary to lethargy, biPAP, change in neuro status. SLP will f/u when appropriate.   Therapist: Harlon Ditty, MA CCC-SLP [phone]

## 2012-05-01 LAB — CBC
HCT: 41.4 % (ref 36.0–46.0)
MCH: 29.7 pg (ref 26.0–34.0)
MCHC: 31.9 g/dL (ref 30.0–36.0)
RDW: 12.7 % (ref 11.5–15.5)

## 2012-05-01 LAB — BASIC METABOLIC PANEL
BUN: 22 mg/dL (ref 6–23)
Calcium: 11.1 mg/dL — ABNORMAL HIGH (ref 8.4–10.5)
Creatinine, Ser: 0.79 mg/dL (ref 0.50–1.10)
GFR calc Af Amer: 89 mL/min — ABNORMAL LOW (ref >90)
GFR calc non Af Amer: 77 mL/min — ABNORMAL LOW (ref >90)
Potassium: 3.8 mEq/L (ref 3.5–5.1)

## 2012-05-01 LAB — GLUCOSE, CAPILLARY
Glucose-Capillary: 91 mg/dL (ref 70–99)
Glucose-Capillary: 107 mg/dL — ABNORMAL HIGH (ref 70–99)

## 2012-05-01 LAB — PHOSPHORUS: Phosphorus: 3.1 mg/dL (ref 2.3–4.6)

## 2012-05-01 MED ORDER — POTASSIUM CHLORIDE CRYS ER 20 MEQ PO TBCR
40.0000 meq | EXTENDED_RELEASE_TABLET | Freq: Once | ORAL | Status: AC
  Administered 2012-05-01: 40 meq via ORAL

## 2012-05-01 MED ORDER — FUROSEMIDE 40 MG PO TABS
40.0000 mg | ORAL_TABLET | Freq: Every day | ORAL | Status: DC
  Administered 2012-05-01 – 2012-05-05 (×5): 40 mg via ORAL
  Filled 2012-05-01 (×4): qty 1

## 2012-05-01 MED ORDER — ACETAZOLAMIDE SODIUM 500 MG IJ SOLR
250.0000 mg | Freq: Four times a day (QID) | INTRAMUSCULAR | Status: AC
  Administered 2012-05-01 (×3): 250 mg via INTRAVENOUS
  Filled 2012-05-01 (×3): qty 500

## 2012-05-01 MED ORDER — POTASSIUM CHLORIDE CRYS ER 20 MEQ PO TBCR
EXTENDED_RELEASE_TABLET | ORAL | Status: AC
  Administered 2012-05-01: 40 meq via ORAL
  Filled 2012-05-01: qty 2

## 2012-05-01 NOTE — Progress Notes (Signed)
ANTICOAGULATION CONSULT NOTE - Follow Up Consult  Pharmacy Consult for Heparin Indication: atrial fibrillation  Allergies  Allergen Reactions  . Azithromycin     REACTION: inc blood sugar  . Codeine Itching  . Coumadin (Warfarin) Other (See Comments)    Causes bleeding  . Oxybutynin Chloride Er     Constipation/ dizziness/ dry mouth    Patient Measurements: Height: 5\' 6"  (167.6 cm) Weight: 149 lb 0.5 oz (67.6 kg) IBW/kg (Calculated) : 59.3  Heparin Dosing Weight: 66 kg  Vital Signs: Temp: 98.1 F (36.7 C) (11/01 0400) Temp src: Oral (11/01 0400) BP: 101/48 mmHg (11/01 0600) Pulse Rate: 80  (11/01 0100)  Labs:  Basename 05/01/12 0540 04/30/12 0440 04/29/12 1325 04/29/12 0455  HGB 13.2 12.9 -- --  HCT 41.4 40.0 -- 40.5  PLT 289 299 -- 286  APTT -- -- -- --  LABPROT -- -- -- --  INR -- -- -- --  HEPARINUNFRC 0.85* 0.67 0.61 --  CREATININE -- 0.82 -- 0.83  CKTOTAL -- -- -- --  CKMB -- -- -- --  TROPONINI -- -- -- --    Estimated Creatinine Clearance: 51.2 ml/min (by C-G formula based on Cr of 0.82).  Assessment:  Heparin level is supratherapeutic this a.m. on 1450 units/hr.  CBC remains stable. No bleeding noted. On IV heparin since 10/22.  Not on Coumadin prior to admission due to hx AVMs and GIB, along with baseline dementia.  Goal of Therapy:  Heparin level 0.3-0.7 units/ml Monitor platelets by anticoagulation protocol: Yes   Plan:   Decrease heparin drip to 1300 units/hr.  Continue daily heparin level and CBC.  Will follow up long-term anticoag plans.    Christoper Fabian, PharmD, BCPS Clinical pharmacist, pager [phone] 05/01/2012,6:59 AM

## 2012-05-01 NOTE — Progress Notes (Signed)
Name: [name] MRN: [id number] DOB: 10/14/1929    LOS: 10 Date of admit 04/21/2012   Referring Provider:  Dr Antoine Poche cards Reason for Referral:  Acute Respiratory failure  PULMONARY / CRITICAL CARE MEDICINE  HPI:  78 year old COPD NOS, mild dementia per chart (cognitively with capacity on 04/23/12), AVM colon hx, DM-2, PAF, osteoporosis,   patient with diastolic chf and hx of intubation in June 2010 (based on chart review) for RESISTANT ACINETOBACTER lower lobe pneumonia  ? Following ankle fracture then. Admitted with resp distress on 04/21/2012. Being treated for acute diastolic chf but without improvement despite 1400cc negative. On 04/23/12 AM noted to have decompensation with resp status overnight and needing 50% face mask and RR 24-30 but speaking full sentences. CXR c/w bilateral infiltrates. PCCM consulted 04/23/12. Patient admitted as full code but after discussion with CCM patient NO CPR but full medical care and desiring short term ventilation only (no LTAC, no trach)  Note:  Last admit to [place] was 2010 but had one admit interim to [place], [place] DEVICES bipap 04/23/12 >> [place] ? 04/21/2012 >>  MICRO Results for orders placed during the hospital encounter of 04/21/12  MRSA PCR SCREENING     Status: Normal   Collection Time   04/21/12  5:25 PM      Component Value Range Status Comment   MRSA by PCR NEGATIVE  NEGATIVE Final     ANTIBIOTICs Anti-infectives     Start     Dose/Rate Route Frequency Ordered Stop   04/24/12 1400   cefTAZidime (FORTAZ) 1 g in dextrose 5 % 50 mL IVPB        1 g 100 mL/hr over 30 Minutes Intravenous 3 times per day 04/24/12 1256     04/23/12 1700   levofloxacin (LEVAQUIN) IVPB 750 mg  Status:  Discontinued        750 mg 100 mL/hr over 90 Minutes Intravenous Every 24 hours 04/22/12 1639 04/24/12 1256   04/23/12 1400   piperacillin-tazobactam (ZOSYN) IVPB 3.375 g  Status:  Discontinued        3.375 g 100 mL/hr over 30 Minutes  Intravenous 3 times per day 04/23/12 1058 04/24/12 1256   04/23/12 1200   vancomycin (VANCOCIN) IVPB 1000 mg/200 mL premix  Status:  Discontinued        1,000 mg 200 mL/hr over 60 Minutes Intravenous Every 24 hours 04/23/12 1057 04/24/12 1256   04/22/12 1630   levofloxacin (LEVAQUIN) IVPB 750 mg  Status:  Discontinued        750 mg 100 mL/hr over 90 Minutes Intravenous Every 24 hours 04/22/12 1618 04/22/12 1639         Events Since Admission: 04/21/2012 12:34 PM >>  Subjective: BiPAP  last 24 hours, currently comfortable on 6L/min [place]  Vital Signs: Temp:  [98 F (36.7 C)-98.9 F (37.2 C)] 98 F (36.7 C) (11/01 0829) Pulse Rate:  [29-110] 80  (11/01 0829) Resp:  [18-27] 23  (11/01 0829) BP: (95-146)/(48-106) 126/59 mmHg (11/01 0829) SpO2:  [83 %-100 %] 98 % (11/01 0829) Weight:  [67.6 kg (149 lb 0.5 oz)] 67.6 kg (149 lb 0.5 oz) (11/01 0432)  Intake/Output Summary (Last 24 hours) at 05/01/12 0914 Last data filed at 05/01/12 0900  Gross per 24 hour  Intake 848.55 ml  Output    150 ml  Net 698.55 ml    Physical Examination: General:  Frail eldeerly female Neuro:  Alert and Oriented x 3. RASS +1. GCS  15. Moves all 4 ext HEENT:  No lesions  Neck:  Supple Cardiovascular:  Regular, 90's. No murmurs Lungs:  RR 34, Acc muscle +,. Full sentences + Abdomen:  Soft, non tender Musculoskeletal:  Cachectic, No edema, No cyanosis Skin:  Intact   Dg Chest 2 View  04/22/2012  *RADIOLOGY REPORT*  Clinical Data: Follow up possible pneumonia  CHEST - 2 VIEW  Comparison: 04/21/2012  Findings: Multifocal patchy airspace opacities, stable versus mildly increased, possibly reflecting multifocal pneumonia or asymmetric interstitial edema.  Small left pleural effusion.  No pneumothorax.  The heart is top normal in size.  Degenerative changes of the visualized thoracolumbar spine.  IMPRESSION: Multifocal patchy airspace opacities, stable versus mildly increased, possibly reflecting multifocal  pneumonia or asymmetric interstitial edema.  Small left pleural effusion.   Original Report Authenticated By: Charline Bills, M.D.      Principal Problem:  *Acute and chronic respiratory failure Active Problems:  HYPERCHOLESTEROLEMIA  TOBACCO USE  HYPERTENSION  Atrial fibrillation  DIABETES MELLITUS, TYPE II  Dementia  Acute diastolic CHF (congestive heart failure)  COPD (chronic obstructive pulmonary disease)   ASSESSMENT AND PLAN  PULMONARY  Lab 04/30/12 0822 04/28/12 1200 04/24/12 0930  PHART 7.405 7.471* 7.399  PCO2ART 63.1* 71.2* 63.2*  PO2ART 68.0* 49.0* 75.2*  HCO3 39.5* 52.0* 38.3*  O2SAT 93.0 84.0 94.9   Ventilator Settings:   A:  Acute on chronic resp failure due to COPD, CHF +/- PNA. Spoke with the patient extensively and record reviewed.  Hypoxemia is likely a combination of PNA and COPD with pulmonary edema as a contributing but not major factor.  The interesting part is the patient desats at night more than during the day and falls asleep easily during the day.  OSA is likely a major contributing factor here.  Confusion and airway protection are much improved. P:   - BiPAP again when asleep only. - PO lasix 40 daily and give additional doses of acetazolamide. - Finished course of abx, will d/c. - Xopenex + Atrovent + Pulmicort as ordered. - CSW to arrange for home O2 and home BiPAP.  CARDIOVASCULAR  Lab 04/27/12 0615  TROPONINI --  LATICACIDVEN --  PROBNP 1311.0*   A: Acute diastolic CHF A fib w controlled rate P:  Restart lasix PO doses. Diltiazem to po. Diamox again today. ASA. Atorvastatin.  RENAL Lab 05/01/12 0540 04/30/12 0440 04/29/12 0455 04/28/12 0100 04/27/12 1033  NA 139 142 138 139 137  K 3.8 3.9 -- -- --  CL 98 98 92* 89* 89*  CO2 36* 38* 42* 42* 43*  BUN 22 25* 25* 33* 32*  CREATININE 0.79 0.82 0.83 0.85 0.76  CALCIUM 11.1* 11.4* 11.2* 10.1 9.4  MG 2.0 2.2 -- -- --  PHOS 3.1 3.7 -- -- --   Intake/Output      10/31 0701 -  11/01 0700 11/01 0701 - 11/02 0700   P.O. 320    I.V. (mL/kg) 400 (5.9) 13.1 (0.2)   IV Piggyback 150    Total Intake(mL/kg) 870 (12.9) 13.1 (0.2)   Urine (mL/kg/hr) 150 (0.1)    Total Output 150    Net +720 +13.1        Urine Occurrence 3 x      Intake/Output Summary (Last 24 hours) at 05/01/12 0914 Last data filed at 05/01/12 0900  Gross per 24 hour  Intake 848.55 ml  Output    150 ml  Net 698.55 ml   A:  Hypokalemia P:   -  D/C lasix. - Acetazolamide as ordered. - Replace K.  GASTROINTESTINAL No results found for this basename: AST:5,ALT:5,ALKPHOS:5,BILITOT:5,PROT:5,ALBUMIN:5 in the last 168 hours  A:  nutrition P:  Heart healthy diet.  HEMATOLOGIC  Lab 05/01/12 0540 04/30/12 0440 04/29/12 0455 04/28/12 0100 04/27/12 0615  HGB 13.2 12.9 13.1 13.4 13.0  HCT 41.4 40.0 40.5 40.5 39.2  PLT 289 299 286 289 279  INR -- -- -- -- --  APTT -- -- -- -- --   A:  AT risk for anemia criticl illness P:  - PRBC for hgb </= 7.0 gm.  INFECTIOUS  Lab 05/01/12 0540 04/30/12 0440 04/29/12 0455 04/28/12 0100 04/27/12 0615 04/25/12 0520  WBC 9.1 10.4 10.8* 13.4* 12.5* --  PROCALCITON -- -- -- -- -- 0.26   Cultures: Results for orders placed during the hospital encounter of 04/21/12  MRSA PCR SCREENING     Status: Normal   Collection Time   04/21/12  5:25 PM      Component Value Range Status Comment   MRSA by PCR NEGATIVE  NEGATIVE Final    Antibiotics: Anti-infectives     Start     Dose/Rate Route Frequency Ordered Stop   04/24/12 1400   cefTAZidime (FORTAZ) 1 g in dextrose 5 % 50 mL IVPB        1 g 100 mL/hr over 30 Minutes Intravenous 3 times per day 04/24/12 1256     04/23/12 1700   levofloxacin (LEVAQUIN) IVPB 750 mg  Status:  Discontinued        750 mg 100 mL/hr over 90 Minutes Intravenous Every 24 hours 04/22/12 1639 04/24/12 1256   04/23/12 1400   piperacillin-tazobactam (ZOSYN) IVPB 3.375 g  Status:  Discontinued        3.375 g 100 mL/hr over 30 Minutes  Intravenous 3 times per day 04/23/12 1058 04/24/12 1256   04/23/12 1200   vancomycin (VANCOCIN) IVPB 1000 mg/200 mL premix  Status:  Discontinued        1,000 mg 200 mL/hr over 60 Minutes Intravenous Every 24 hours 04/23/12 1057 04/24/12 1256   04/22/12 1630   levofloxacin (LEVAQUIN) IVPB 750 mg  Status:  Discontinued        750 mg 100 mL/hr over 90 Minutes Intravenous Every 24 hours 04/22/12 1618 04/22/12 1639         A:  ? HCAP - Pct, WBC, clinical hx argue against. Has a hx of resistant acinitobacter from 2010 P:   - Cultures sent on 1028 and are pending. - Prior cx's acinitobacter resistant to zosyn (high MIC) and levaquin, intermediate to ceftaz, ceftriaxone, tobra. D/C abx.  ENDOCRINE  Lab 04/30/12 2221 04/30/12 1646 04/30/12 1211 04/30/12 0835 04/30/12 0650  GLUCAP 156* 135* 109* 140* 222*   A:  Hyperglycemia, hx DM, persistent hypoglycemia in the morning. P:   - D/Ced glyburide with adequate BS (no further hypoglycemia). - Continue metformin (does not cause hypoglycemia). - No long acting lantus. - ISS.  NEUROLOGIC  A:  Sporadic deterioration in mental status, cause remains unclear to me.  More likely to be hypoglycemia.  See above.  Exam now is non-focal, no need for head CT.  If persists will call neuro in AM. P:   - Minimize sedation. - D/C glyburide. - Monitor closely.  Alyson Reedy, M.D. [facility] Pulmonary/Critical Care Medicine. Pager: [phone]. After hours pager: [phone].

## 2012-05-01 NOTE — Progress Notes (Signed)
Speech Language Pathology Dysphagia Treatment Patient Details Name: [name] MRN: [id number] DOB: 1929-10-15 Today's Date: 05/01/2012 Time: 6213-0865 SLP Time Calculation (min): 1432 min  Assessment / Plan / Recommendation Clinical Impression  Pt. seen for dysphagia treatment on nasal cannula sitting in recliner.  Observed pt. consume regular texture solids and thin water via cup.  No cough, throat clear or wet vocal quality present.  Slightly audible swallow sometimes suggestive of pharyngeal abnormality.  Pt. required min verbal cues to not phonate immediately after swallowing.  Recommend she continue regular diet and thin liquids, no straws.  No continued ST recommended at this time.  Please reconsult if ST can assist.      Diet Recommendation  Continue with Current Diet: Regular;Thin liquid    SLP Plan Discharge SLP treatment due to (comment);All goals met       Swallowing Goals  SLP Swallowing Goals Patient will consume recommended diet without observed clinical signs of aspiration with: Minimal assistance Swallow Study Goal #1 - Progress: Met Patient will utilize recommended strategies during swallow to increase swallowing safety with: Minimal cueing Swallow Study Goal #2 - Progress: Met  General Temperature Spikes Noted: No Respiratory Status: Supplemental O2 delivered via (comment) (nasal cannula) Behavior/Cognition: Confused;Alert;Cooperative Oral Cavity - Dentition: Missing dentition Patient Positioning: Upright in chair  Oral Cavity - Oral Hygiene Does patient have any of the following "at risk" factors?: Oxygen therapy - cannula, mask, simple oxygen devices   Dysphagia Treatment Treatment focused on: Skilled observation of diet tolerance;Utilization of compensatory strategies Treatment Methods/Modalities: Skilled observation Patient observed directly with PO's: Yes Type of PO's observed: Regular;Thin liquids Feeding: Able to feed self Liquids provided via:  Cup;No straw Pharyngeal Phase Signs & Symptoms: Audible swallow Type of cueing: Verbal Amount of cueing: Minimal        Royce Macadamia M.Ed [employer] [phone]  05/01/2012

## 2012-05-01 NOTE — Progress Notes (Signed)
Physical Therapy Treatment Patient Details Name: [name] MRN: [id number] DOB: 1929-12-24 Today's Date: 05/01/2012 Time: 4098-1191 PT Time Calculation (min): 23 min  PT Assessment / Plan / Recommendation Comments on Treatment Session  Pt. presents ready to get up and ambulating. Pt. became easily distracted in the hallway during ambulation looking into pt. rooms and at signs on walls. Pt. min(A) with ambulation for balance, safety, and some LOB periodically during session. Pt. mod(A) near end of ambulation with progressed leaning backward into SPTA. Pt. presented to need VC's for keeping close to RW and to keep her LEs centered underneath her. Pt. presents to need VCs for hand placement and proper technique for transfers and to not pick up RW when ambulating (initially).    Follow Up Recommendations  Home health PT;Supervision - Intermittent     Does the patient have the potential to tolerate intense rehabilitation     Barriers to Discharge        Equipment Recommendations  None recommended by OT    Recommendations for Other Services OT consult  Frequency Min 3X/week   Plan      Precautions / Restrictions Precautions Precautions: Fall Restrictions Weight Bearing Restrictions: No   Pertinent Vitals/Pain Pt. Presents with 2L O2 via wall hook-up, pt. Ambulated on RA for ~8ft. And began demonstrating signs of SOB and rated 3/4 on dyspnea scale. Pt. Placed back on 2L O2 via wall hook-up in room at end of session.    Mobility  Bed Mobility Bed Mobility: Not assessed Transfers Transfers: Sit to Stand;Stand to Sit Sit to Stand: 4: Min assist;With upper extremity assist;With armrests;From chair/3-in-1 Stand to Sit: 4: Min assist;With upper extremity assist;With armrests;To chair/3-in-1 Details for Transfer Assistance: Pt. provided with VC's for proper hand placement for sit<>stand. (A) for balance once standing. Ambulation/Gait Ambulation/Gait Assistance: 4: Min assist;3: Mod  assist Ambulation Distance (Feet): 75 Feet Assistive device: Rolling walker Ambulation/Gait Assistance Details: Pt. ambulated with RW around unit with min (A) for safety, LOB, and VC's for proper technique. Pt. presented very distracted with ambulation, looking into rooms, signs on wall, etc and VCs for focusing on task, staying inside the RW and to keep walker going straight (would veer to direction pt. focused toward). Pt. ambulated on RA and begin showing signs of SOB. Pt rated 3/4 on dyspnea scale. 2L O2 via wall hook-up upon returning back to room. Gait Pattern: Step-to pattern;Decreased stride length;Shuffle;Lateral trunk lean to right;Narrow base of support Gait velocity: Slow General Gait Details: Pt. encouraged to take bigger steps, to keep her feet underneath her body, and to use the RW by rolling and not lifting it up with each step (only presented to need reminders in the beginning). Stairs: No Wheelchair Mobility Wheelchair Mobility: No      PT Goals Acute Rehab PT Goals PT Goal Formulation: With patient Time For Goal Achievement: 05/09/12 Potential to Achieve Goals: Good Pt will go Supine/Side to Sit: with modified independence Pt will go Sit to Supine/Side: with modified independence Pt will go Sit to Stand: with modified independence PT Goal: Sit to Stand - Progress: Not met Pt will go Stand to Sit: with modified independence PT Goal: Stand to Sit - Progress: Not met Pt will Ambulate: >150 feet;with modified independence PT Goal: Ambulate - Progress: Progressing toward goal Pt will Go Up / Down Stairs: 3-5 stairs;with rail(s);with modified independence Pt will Perform Home Exercise Program: Independently  Visit Information  Last PT Received On: 05/01/12 Assistance Needed: +2 (+1 for  transfer)    Subjective Data  Subjective: "Oh I think I can go out for a walk" Patient Stated Goal: return home   Cognition  Overall Cognitive Status: Impaired Area of Impairment:  Safety/judgement;Awareness of deficits;Following commands;Attention;Problem solving Arousal/Alertness: Awake/alert Orientation Level: Disoriented to;Place (Continually stated that she was in a church.) Behavior During Session: [facility] for tasks performed Current Attention Level: Divided Attention - Other Comments: During ambulation, pt. would consistently look around and into rooms, at pictures, etc. and would continue walking and be given cues to stop and restart if she wanted to look at something. Following Commands: Follows one step commands consistently Safety/Judgement: Decreased safety judgement for tasks assessed Cognition - Other Comments: Pt. presented to follow 1-step commands throughout session, very easily distracted from external factors    Balance  Balance Balance Assessed: No  End of Session PT - End of Session Equipment Utilized During Treatment: Gait belt Activity Tolerance: Patient tolerated treatment well Patient left: in chair;with call bell/phone within reach (eating lunch) Nurse Communication: Mobility status    [name] , SPTA 05/01/2012, 1:51 PM   [name], PTA [phone] 05/01/2012

## 2012-05-01 NOTE — Progress Notes (Signed)
ANTICOAGULATION CONSULT NOTE - Follow Up Consult  Pharmacy Consult for Heparin Indication: atrial fibrillation  Allergies  Allergen Reactions  . Azithromycin     REACTION: inc blood sugar  . Codeine Itching  . Coumadin (Warfarin) Other (See Comments)    Causes bleeding  . Oxybutynin Chloride Er     Constipation/ dizziness/ dry mouth    Patient Measurements: Height: 5\' 6"  (167.6 cm) Weight: 149 lb 0.5 oz (67.6 kg) IBW/kg (Calculated) : 59.3  Heparin Dosing Weight: 66 kg  Vital Signs: Temp: 98 F (36.7 C) (11/01 0829) Temp src: Oral (11/01 0829) BP: 126/59 mmHg (11/01 0829) Pulse Rate: 80  (11/01 0829)  Labs:  Basename 05/01/12 1507 05/01/12 0540 04/30/12 0440 04/29/12 0455  HGB -- 13.2 12.9 --  HCT -- 41.4 40.0 40.5  PLT -- 289 299 286  APTT -- -- -- --  LABPROT -- -- -- --  INR -- -- -- --  HEPARINUNFRC 0.27* 0.85* 0.67 --  CREATININE -- 0.79 0.82 0.83  CKTOTAL -- -- -- --  CKMB -- -- -- --  TROPONINI -- -- -- --    Estimated Creatinine Clearance: 52.5 ml/min (by C-G formula based on Cr of 0.79).  Assessment:  Heparin drip 1300 uts/hr HL 0.27 - slightly less than goal but patient  lost IV access this morning and  heparin drip was off until 12 noon.  This HL is only a 4hr level and would assume heparin level still rising slightly.  CBC remains stable. No bleeding noted.  Not on Coumadin prior to admission due to hx AVMs and GIB, along with baseline dementia.  Goal of Therapy:  Heparin level 0.3-0.7 units/ml Monitor platelets by anticoagulation protocol: Yes   Plan:   Continue heparin drip 1300 uts/hr Daily HL, CBC  Leota Sauers Pharm.D. CPP, BCPS Clinical Pharmacist [phone] 05/01/2012 4:44 PM

## 2012-05-01 NOTE — Clinical Social Work Note (Signed)
Clinical Social Worker updated patient's son with SNF bed offers received and he chose [facility]. CSW will continue to follow.   Rozetta Nunnery MSW, [employer] [phone]

## 2012-05-01 NOTE — Progress Notes (Signed)
Patient: [name] [id number] / Admit Date: 04/21/2012 / Date of Encounter: 05/01/2012, 7:55 AM   Subjective  Initially coherent in conversation responding appropriately, and denying CP or SOB but also interjects statements indicating confusion: - she reports going outside for a 3-4 hour skull scan on the grass in the front of the hospital this morning - "every time I sit down somewhere, my shoes disappear"   Objective   Telemetry: afib controlled rate, rare 2-3 sec pause Physical Exam: Filed Vitals:   05/01/12 0600  BP: 101/48  Pulse: 74  Temp: 98.1  Resp: 21   General: Well developed elderly WF in no acute distress. Head: Normocephalic, atraumatic, sclera non-icteric, no xanthomas, nares are without discharge. Neck: JVD not elevated. Lungs: Coarse/rhonchorous anteirorly without wheezes or rales. Breathing is unlabored. Heart: RRR S1 S2 without murmurs, rubs, or gallops.  Abdomen: Soft, non-tender, non-distended with normoactive bowel sounds. No hepatomegaly. No rebound/guarding. No obvious abdominal masses. Msk:  Strength and tone appear normal for age. Extremities: No clubbing or cyanosis. No edema.  Distal pedal pulses are 2+ and equal bilaterally. Neuro: Alert and oriented to person, place, and why she's here but thought it was 04/26/12 (close) - however, interjects with statements that don't make sense. Moves all extremities spontaneously. Psych:  Responds to questions as above with a normal affect.    Intake/Output Summary (Last 24 hours) at 05/01/12 0755 Last data filed at 05/01/12 0600  Gross per 24 hour  Intake  805.5 ml  Output    150 ml  Net  655.5 ml    Inpatient Medications:    . antiseptic oral rinse  15 mL Mouth Rinse BID  . aspirin  81 mg Oral Daily  . atorvastatin  10 mg Oral q1800  . budesonide  0.25 mg Nebulization BID  . calcium carbonate  1 tablet Oral BID WC  . cefTAZidime (FORTAZ)  IV  1 g Intravenous Q8H  . cholecalciferol  2,000 Units Oral Daily  .  digoxin  0.125 mg Oral Daily  . diltiazem  240 mg Oral Daily  . insulin aspart  0-15 Units Subcutaneous TID WC  . insulin aspart  0-5 Units Subcutaneous QHS  . insulin aspart  3 Units Subcutaneous TID WC  . ipratropium  0.5 mg Nebulization QID  . levalbuterol  0.63 mg Nebulization QID  . metFORMIN  500 mg Oral BID AC  . multivitamin with minerals  1 tablet Oral Daily  . nicotine  21 mg Transdermal Daily  . DISCONTD: glyBURIDE  5 mg Oral Daily  . DISCONTD: levalbuterol  0.63 mg Nebulization Q4H    Labs:  [facility] 05/01/12 0540 04/30/12 0440  NA 139 142  K 3.8 3.9  CL 98 98  CO2 36* 38*  GLUCOSE 126* 53*  BUN 22 25*  CREATININE 0.79 0.82  CALCIUM 11.1* 11.4*  MG 2.0 2.2  PHOS 3.1 3.7    Basename 05/01/12 0540 04/30/12 0440  WBC 9.1 10.4  NEUTROABS -- --  HGB 13.2 12.9  HCT 41.4 40.0  MCV 93.2 92.8  PLT 289 299    Radiology/Studies:  Dg Chest 2 View  04/22/2012  *RADIOLOGY REPORT*  Clinical Data: Follow up possible pneumonia  CHEST - 2 VIEW  Comparison: 04/21/2012  Findings: Multifocal patchy airspace opacities, stable versus mildly increased, possibly reflecting multifocal pneumonia or asymmetric interstitial edema.  Small left pleural effusion.  No pneumothorax.  The heart is top normal in size.  Degenerative changes of the visualized thoracolumbar spine.  IMPRESSION: Multifocal patchy airspace opacities, stable versus mildly increased, possibly reflecting multifocal pneumonia or asymmetric interstitial edema.  Small left pleural effusion.   Original Report Authenticated By: Charline Bills, M.D.    Dg Chest Port 1 View  04/30/2012  *RADIOLOGY REPORT*  Clinical Data: Respiratory failure, history of pneumonia  PORTABLE CHEST - 1 VIEW  Comparison: 04/25/2012  Findings: Cardiomegaly again noted.  Persistent small left pleural effusion with left basilar atelectasis or infiltrate.  No pulmonary edema.  Patchy airspace disease in the right lower lobe probable residual  pneumonia.  Follow-up to resolution is recommended.  IMPRESSION:  Persistent small left pleural effusion with left basilar atelectasis or infiltrate.  No pulmonary edema.  Patchy airspace disease in the right lower lobe probable residual pneumonia. Follow-up to resolution is recommended.   Original Report Authenticated By: Natasha Mead, M.D.      Assessment and Plan  1. Afib (h/o such) - rates controlled, rare pause. Con't current rx. Remains on  heparin gtt - not on Coumadin as OP due to hx of AVM/GIB - MD to clarify decision. 2. Acute on chronic resp failure - ?COPD, CHF, +/- PNA (hx of resistant acinitobacter 2010) - appreciate pulm assistance. 3. Fluctuating mental status - felt possibly r/t hypoglycemia. Sedation minimized. Has dementia so unclear baseline. Still somewhat confused. ?Neuro eval 4. Acute on chronic diastolic CHF - Lasix held. Weight slightly up, but appears euvolemic on exam. 5. Hypercalemia - may need medicine workup esp in light of #3. 6. Hypoglycemia - improved this AM.  Signed, Ronie Spies PA-C Patient examined and agree with plan. Altered mental status is most likely multifactorial. With no focal findings on exam will refer neuro consult. Continue supportive care with involvement by pulmonary critical care.  Valera Castle, MD 05/01/2012 9:31 AM

## 2012-05-02 LAB — CBC
HCT: 39.7 % (ref 36.0–46.0)
Hemoglobin: 12.8 g/dL (ref 12.0–15.0)
RBC: 4.29 MIL/uL (ref 3.87–5.11)
WBC: 8.9 10*3/uL (ref 4.0–10.5)

## 2012-05-02 LAB — BASIC METABOLIC PANEL
BUN: 27 mg/dL — ABNORMAL HIGH (ref 6–23)
CO2: 33 mEq/L — ABNORMAL HIGH (ref 19–32)
Chloride: 103 mEq/L (ref 96–112)
Glucose, Bld: 127 mg/dL — ABNORMAL HIGH (ref 70–99)
Potassium: 4 mEq/L (ref 3.5–5.1)
Sodium: 141 mEq/L (ref 135–145)

## 2012-05-02 LAB — MAGNESIUM: Magnesium: 1.9 mg/dL (ref 1.5–2.5)

## 2012-05-02 LAB — PHOSPHORUS: Phosphorus: 2.9 mg/dL (ref 2.3–4.6)

## 2012-05-02 NOTE — Progress Notes (Signed)
Patient: [name] [id number] / Admit Date: 04/21/2012 / Date of Encounter: 05/02/2012, 1:05 PM   Subjective   More alert today.    Objective   Telemetry: afib controlled rate, Physical Exam: Filed Vitals:   05/01/12 0600  BP: 101/48  Pulse: 74  Temp: 98.1  Resp: 21   General: Well developed elderly WF in no acute distress. Head: Normocephalic, atraumatic, sclera non-icteric, no xanthomas, nares are without discharge. Neck: JVD not elevated. Lungs: Coarse/rhonchorous anteirorly without wheezes or rales. Breathing is unlabored. Heart: irregularly irregular, S1 S2 without murmurs, rubs, or gallops.  Abdomen: Soft, non-tender, non-distended with normoactive bowel sounds. No hepatomegaly. No rebound/guarding. No obvious abdominal masses. Msk:  Strength and tone appear normal for age. Extremities: No clubbing or cyanosis. No edema.  Distal pedal pulses are 2+ and equal bilaterally. Neuro: nonfocal  Psych:  Responds to questions as above with a normal affect.    Intake/Output Summary (Last 24 hours) at 05/02/12 1305 Last data filed at 05/02/12 0900  Gross per 24 hour  Intake    757 ml  Output   1000 ml  Net   -243 ml    Inpatient Medications:     . acetaZOLAMIDE  250 mg Intravenous Q6H  . antiseptic oral rinse  15 mL Mouth Rinse BID  . aspirin  81 mg Oral Daily  . atorvastatin  10 mg Oral q1800  . budesonide  0.25 mg Nebulization BID  . digoxin  0.125 mg Oral Daily  . diltiazem  240 mg Oral Daily  . furosemide  40 mg Oral Daily  . insulin aspart  0-15 Units Subcutaneous TID WC  . insulin aspart  0-5 Units Subcutaneous QHS  . insulin aspart  3 Units Subcutaneous TID WC  . ipratropium  0.5 mg Nebulization QID  . levalbuterol  0.63 mg Nebulization QID  . metFORMIN  500 mg Oral BID AC  . multivitamin with minerals  1 tablet Oral Daily  . nicotine  21 mg Transdermal Daily    Labs:  [facility] 05/02/12 0520 05/01/12 0540  NA 141 139  K 4.0 3.8  CL 103 98  CO2 33* 36*    GLUCOSE 127* 126*  BUN 27* 22  CREATININE 0.86 0.79  CALCIUM 10.8* 11.1*  MG 1.9 2.0  PHOS 2.9 3.1    Basename 05/02/12 0520 05/01/12 0540  WBC 8.9 9.1  NEUTROABS -- --  HGB 12.8 13.2  HCT 39.7 41.4  MCV 92.5 93.2  PLT 311 289    Radiology/Studies:  Dg Chest 2 View  04/22/2012  *RADIOLOGY REPORT*  Clinical Data: Follow up possible pneumonia  CHEST - 2 VIEW  Comparison: 04/21/2012  Findings: Multifocal patchy airspace opacities, stable versus mildly increased, possibly reflecting multifocal pneumonia or asymmetric interstitial edema.  Small left pleural effusion.  No pneumothorax.  The heart is top normal in size.  Degenerative changes of the visualized thoracolumbar spine.  IMPRESSION: Multifocal patchy airspace opacities, stable versus mildly increased, possibly reflecting multifocal pneumonia or asymmetric interstitial edema.  Small left pleural effusion.   Original Report Authenticated By: Charline Bills, M.D.    Dg Chest Port 1 View  04/30/2012  *RADIOLOGY REPORT*  Clinical Data: Respiratory failure, history of pneumonia  PORTABLE CHEST - 1 VIEW  Comparison: 04/25/2012  Findings: Cardiomegaly again noted.  Persistent small left pleural effusion with left basilar atelectasis or infiltrate.  No pulmonary edema.  Patchy airspace disease in the right lower lobe probable residual pneumonia.  Follow-up to resolution is recommended.  IMPRESSION:  Persistent small left pleural effusion with left basilar atelectasis or infiltrate.  No pulmonary edema.  Patchy airspace disease in the right lower lobe probable residual pneumonia. Follow-up to resolution is recommended.   Original Report Authenticated By: Natasha Mead, M.D.      Assessment and Plan  1. Afib (h/o such) - rates controlled, rare pause. Con't current rx. Remains on  heparin gtt - not on Coumadin as OP due to hx of AVM/GIB -  2. Acute on chronic resp failure - ?COPD, CHF, +/- PNA (hx of resistant acinitobacter 2010) - appreciate  pulm assistance. 3. Fluctuating mental status - felt possibly r/t hypoglycemia. Sedation minimized. Has dementia so unclear baseline. Still somewhat confused. ?Neuro eval 4. Acute on chronic diastolic CHF - Lasix held. Weight slightly up, but appears euvolemic on exam. 5. Hypercalemia - may need medicine workup esp in light of #3. 6. Hypoglycemia - improved this AM.   Elyn Aquas., MD 05/02/2012 1:05 PM

## 2012-05-02 NOTE — Progress Notes (Signed)
Pt tolerated BiPap for approximately 2 1/2 hours.  Pt stated "can't breath with that heavy mask on"; BiPap continuously alarming because pt moves about and loosens the mask; pt also has her dentures out and mask doesn't seal well; will trial pt on O2 via [place] at 4L; respiratory therapy consulted

## 2012-05-02 NOTE — Progress Notes (Signed)
Name: [name] MRN: [id number] DOB: [birth year]    LOS: 11 Date of admit 04/21/2012   Referring Provider:  Dr Antoine Poche cards Reason for Referral:  Acute Respiratory failure  PULMONARY / CRITICAL CARE MEDICINE  HPI:  78 year old COPD NOS, mild dementia per chart (cognitively with capacity on 04/23/12), AVM colon hx, DM-2, PAF, osteoporosis,   patient with diastolic chf and hx of intubation in June 2010 (based on chart review) for RESISTANT ACINETOBACTER lower lobe pneumonia  ? Following ankle fracture then. Admitted with resp distress on 04/21/2012. Being treated for acute diastolic chf but without improvement despite 1400cc negative. On 04/23/12 AM noted to have decompensation with resp status overnight and needing 50% face mask and RR 24-30 but speaking full sentences. CXR c/w bilateral infiltrates. PCCM consulted 04/23/12. Patient admitted as full code but after discussion with CCM patient NO CPR but full medical care and desiring short term ventilation only (no LTAC, no trach)  Note:  Last admit to [place] was 2010 but had one admit interim to [place], [place] DEVICES bipap 04/23/12 >> [place] ? 04/21/2012 >>  MICRO Results for orders placed during the hospital encounter of 04/21/12  MRSA PCR SCREENING     Status: Normal   Collection Time   04/21/12  5:25 PM      Component Value Range Status Comment   MRSA by PCR NEGATIVE  NEGATIVE Final     ANTIBIOTICs Anti-infectives     Start     Dose/Rate Route Frequency Ordered Stop   04/24/12 1400   cefTAZidime (FORTAZ) 1 g in dextrose 5 % 50 mL IVPB  Status:  Discontinued        1 g 100 mL/hr over 30 Minutes Intravenous 3 times per day 04/24/12 1256 05/01/12 0946   04/23/12 1700   levofloxacin (LEVAQUIN) IVPB 750 mg  Status:  Discontinued        750 mg 100 mL/hr over 90 Minutes Intravenous Every 24 hours 04/22/12 1639 04/24/12 1256   04/23/12 1400   piperacillin-tazobactam (ZOSYN) IVPB 3.375 g  Status:  Discontinued        3.375  g 100 mL/hr over 30 Minutes Intravenous 3 times per day 04/23/12 1058 04/24/12 1256   04/23/12 1200   vancomycin (VANCOCIN) IVPB 1000 mg/200 mL premix  Status:  Discontinued        1,000 mg 200 mL/hr over 60 Minutes Intravenous Every 24 hours 04/23/12 1057 04/24/12 1256   04/22/12 1630   levofloxacin (LEVAQUIN) IVPB 750 mg  Status:  Discontinued        750 mg 100 mL/hr over 90 Minutes Intravenous Every 24 hours 04/22/12 1618 04/22/12 1639         Events Since Admission: 04/21/2012 12:34 PM >>  Subjective: BiPAP  last 24 hours, currently comfortable on 6L/min [place]  Vital Signs: Temp:  [97.4 F (36.3 C)-98 F (36.7 C)] 97.6 F (36.4 C) (11/02 0745) Pulse Rate:  [71-84] 71  (11/02 0745) Resp:  [13-21] 17  (11/02 0745) BP: (96-121)/(52-79) 119/57 mmHg (11/02 0745) SpO2:  [91 %-100 %] 100 % (11/02 0822) FiO2 (%):  [2 %] 2 % (11/01 1224) Weight:  [66.316 kg (146 lb 3.2 oz)] 66.316 kg (146 lb 3.2 oz) (11/02 0455)  Intake/Output Summary (Last 24 hours) at 05/02/12 1021 Last data filed at 05/02/12 0900  Gross per 24 hour  Intake    951 ml  Output   1200 ml  Net   -249 ml  Physical Examination: General:  Frail eldeerly female Neuro:  Alert and Oriented x 3. RASS +1. GCS 15. Moves all 4 ext HEENT:  No lesions  Neck:  Supple Cardiovascular:  Regular, 90's. No murmurs Lungs:  RR 34, Acc muscle +,. Full sentences + Abdomen:  Soft, non tender Musculoskeletal:  Cachectic, No edema, No cyanosis Skin:  Intact  Dg Chest 2 View  04/22/2012  *RADIOLOGY REPORT*  Clinical Data: Follow up possible pneumonia  CHEST - 2 VIEW  Comparison: 04/21/2012  Findings: Multifocal patchy airspace opacities, stable versus mildly increased, possibly reflecting multifocal pneumonia or asymmetric interstitial edema.  Small left pleural effusion.  No pneumothorax.  The heart is top normal in size.  Degenerative changes of the visualized thoracolumbar spine.  IMPRESSION: Multifocal patchy airspace  opacities, stable versus mildly increased, possibly reflecting multifocal pneumonia or asymmetric interstitial edema.  Small left pleural effusion.   Original Report Authenticated By: Charline Bills, M.D.    Principal Problem:  *Acute and chronic respiratory failure Active Problems:  HYPERCHOLESTEROLEMIA  TOBACCO USE  HYPERTENSION  Atrial fibrillation  DIABETES MELLITUS, TYPE II  Dementia  Acute diastolic CHF (congestive heart failure)  COPD (chronic obstructive pulmonary disease)  ASSESSMENT AND PLAN  PULMONARY  Lab 04/30/12 0822 04/28/12 1200  PHART 7.405 7.471*  PCO2ART 63.1* 71.2*  PO2ART 68.0* 49.0*  HCO3 39.5* 52.0*  O2SAT 93.0 84.0   Ventilator Settings: Vent Mode:  [-]  FiO2 (%):  [2 %] 2 % A:  Acute on chronic resp failure due to COPD, CHF +/- PNA. Spoke with the patient extensively and record reviewed.  Hypoxemia is likely a combination of PNA and COPD with pulmonary edema as a contributing but not major factor.  The interesting part is the patient desats at night more than during the day and falls asleep easily during the day.  OSA is likely a major contributing factor here.  Confusion and airway protection are much improved. P:   - BiPAP when asleep only. - PO lasix 40 daily and D/C diamox, bicarb at baseline. - Finished course of abx. - Xopenex + Atrovent + Pulmicort as ordered. - CSW to arrange for home O2 and home BiPAP.  CARDIOVASCULAR  Lab 04/27/12 0615  TROPONINI --  LATICACIDVEN --  PROBNP 1311.0*   A: Acute diastolic CHF A fib w controlled rate P:  Restarted lasix PO doses. Diltiazem to po. ASA. Atorvastatin.  RENAL  Lab 05/02/12 0520 05/01/12 0540 04/30/12 0440 04/29/12 0455 04/28/12 0100  NA 141 139 142 138 139  K 4.0 3.8 -- -- --  CL 103 98 98 92* 89*  CO2 33* 36* 38* 42* 42*  BUN 27* 22 25* 25* 33*  CREATININE 0.86 0.79 0.82 0.83 0.85  CALCIUM 10.8* 11.1* 11.4* 11.2* 10.1  MG 1.9 2.0 2.2 -- --  PHOS 2.9 3.1 3.7 -- --    Intake/Output      11/01 0701 - 11/02 0700 11/02 0701 - 11/03 0700   P.O. 800 120   I.V. (mL/kg) 414.1 (6.2)    IV Piggyback     Total Intake(mL/kg) 1214.1 (18.3) 120 (1.8)   Urine (mL/kg/hr) 1200 (0.8)    Total Output 1200    Net +14.1 +120        Urine Occurrence 1 x    Stool Occurrence 1 x      Intake/Output Summary (Last 24 hours) at 05/02/12 1021 Last data filed at 05/02/12 0900  Gross per 24 hour  Intake    951 ml  Output   1200 ml  Net   -249 ml   A:  Hypokalemia P:   - PO lasix. - D/C diamox. - Replace K and Mg as needed.  GASTROINTESTINAL No results found for this basename: AST:5,ALT:5,ALKPHOS:5,BILITOT:5,PROT:5,ALBUMIN:5 in the last 168 hours  A:  nutrition P:  Heart healthy diet.  HEMATOLOGIC  Lab 05/02/12 0520 05/01/12 0540 04/30/12 0440 04/29/12 0455 04/28/12 0100  HGB 12.8 13.2 12.9 13.1 13.4  HCT 39.7 41.4 40.0 40.5 40.5  PLT 311 289 299 286 289  INR -- -- -- -- --  APTT -- -- -- -- --   A:  AT risk for anemia criticl illness P:  - PRBC for hgb </= 7.0 gm.  INFECTIOUS  Lab 05/02/12 0520 05/01/12 0540 04/30/12 0440 04/29/12 0455 04/28/12 0100  WBC 8.9 9.1 10.4 10.8* 13.4*  PROCALCITON -- -- -- -- --   Cultures: Results for orders placed during the hospital encounter of 04/21/12  MRSA PCR SCREENING     Status: Normal   Collection Time   04/21/12  5:25 PM      Component Value Range Status Comment   MRSA by PCR NEGATIVE  NEGATIVE Final    Antibiotics: Anti-infectives     Start     Dose/Rate Route Frequency Ordered Stop   04/24/12 1400   cefTAZidime (FORTAZ) 1 g in dextrose 5 % 50 mL IVPB  Status:  Discontinued        1 g 100 mL/hr over 30 Minutes Intravenous 3 times per day 04/24/12 1256 05/01/12 0946   04/23/12 1700   levofloxacin (LEVAQUIN) IVPB 750 mg  Status:  Discontinued        750 mg 100 mL/hr over 90 Minutes Intravenous Every 24 hours 04/22/12 1639 04/24/12 1256   04/23/12 1400   piperacillin-tazobactam (ZOSYN) IVPB 3.375 g   Status:  Discontinued        3.375 g 100 mL/hr over 30 Minutes Intravenous 3 times per day 04/23/12 1058 04/24/12 1256   04/23/12 1200   vancomycin (VANCOCIN) IVPB 1000 mg/200 mL premix  Status:  Discontinued        1,000 mg 200 mL/hr over 60 Minutes Intravenous Every 24 hours 04/23/12 1057 04/24/12 1256   04/22/12 1630   levofloxacin (LEVAQUIN) IVPB 750 mg  Status:  Discontinued        750 mg 100 mL/hr over 90 Minutes Intravenous Every 24 hours 04/22/12 1618 04/22/12 1639         A:  ? HCAP - Pct, WBC, clinical hx argue against. Has a hx of resistant acinitobacter from 2010 P:   - Cultures sent on 1028 and are pending. - Prior cx's acinitobacter resistant to zosyn (high MIC) and levaquin, intermediate to ceftaz, ceftriaxone, tobra. D/Ced abx on 11/1.  ENDOCRINE  Lab 05/02/12 0751 05/01/12 2146 05/01/12 1721 05/01/12 1254 05/01/12 0825  GLUCAP 110* 107* 154* 91 130*   A:  Hyperglycemia, hx DM, persistent hypoglycemia in the morning. P:   - D/Ced glyburide with adequate BS (no further hypoglycemia). - Continue metformin (does not cause hypoglycemia). - No long acting lantus. - ISS.  NEUROLOGIC  A:  Sporadic deterioration in mental status, cause remains unclear to me.  More likely to be hypoglycemia.  See above.  Exam now is non-focal, no need for head CT.  Baseline dementia but much improved after normalization of CO2/HCO3 and no further episodes of hypoglycemia with d/c of glyburide. P:   - Minimize sedation. - No further glyburide.  Rush Farmer, M.D. [facility] Pulmonary/Critical Care Medicine. Pager: [phone]. After hours pager: [phone].

## 2012-05-02 NOTE — Progress Notes (Signed)
Placed pt. On bipap as per order.pt. Is tolerating well at this time.

## 2012-05-02 NOTE — Progress Notes (Signed)
ANTICOAGULATION CONSULT NOTE - Follow Up Consult  Pharmacy Consult for Heparin Indication: atrial fibrillation  Allergies  Allergen Reactions  . Azithromycin     REACTION: inc blood sugar  . Codeine Itching  . Coumadin (Warfarin) Other (See Comments)    Causes bleeding  . Oxybutynin Chloride Er     Constipation/ dizziness/ dry mouth    Patient Measurements: Height: 5\' 6"  (167.6 cm) Weight: 146 lb 3.2 oz (66.316 kg) IBW/kg (Calculated) : 59.3  Heparin Dosing Weight: 66 kg  Vital Signs: Temp: 97.6 F (36.4 C) (11/02 0745) Temp src: Oral (11/02 0745) BP: 119/57 mmHg (11/02 0745) Pulse Rate: 71  (11/02 0745)  Labs:  Basename 05/02/12 0520 05/01/12 1507 05/01/12 0540 04/30/12 0440  HGB 12.8 -- 13.2 --  HCT 39.7 -- 41.4 40.0  PLT 311 -- 289 299  APTT -- -- -- --  LABPROT -- -- -- --  INR -- -- -- --  HEPARINUNFRC 0.56 0.27* 0.85* --  CREATININE 0.86 -- 0.79 0.82  CKTOTAL -- -- -- --  CKMB -- -- -- --  TROPONINI -- -- -- --    Estimated Creatinine Clearance: 48.8 ml/min (by C-G formula based on Cr of 0.86).    78yo female admitted 04/21/2012 with hx of PAF - with SOB and c/o cough. Pharmacy consulted to dose heparin  Not on Coumadin prior to admission due to hx AVMs and GIB, along with baseline dementia.  Assessment: AC -A fib  IV Heparin -On heparin since 10/22. ? duration of tx. HL at goal , No bleeding noted. CBC stable  ID - HCAP resolved, Afeb WBC wnl  10/24 Vanc>>10/25 10/24 Zosyn>>10/25 10/24 Levo>>10/25 10/25 Ceftaz>>11/1  10/22 - MRSA screen neg 10/24 - strep pneumo & Legionella neg 10/29 - resp cx hx resistant acinetobacter pna in 2010  CV- Hx. HTN/Hyperlipidemia, PAF, Carotid Stenosis, diastolic CHF admitted with rapid afib 2/2 COPD, dilt CD, asa, lipitor (amlodipine, lisinopril pta but BP low), received 3 doses IV digoxin 10/26-27, then 0.125mg  PO daily started 10/28 - may need level. EF 55-60% per echo 10/23. BPs slightly low, HR 70. Po  lasix  Endo - + DM (A1c 7.6), on SSI, lantus d/c'ed and metformin started, CBG sporadic, not adding steroids for COPD. Glyburide d/c'd due to mental status changes poss due to hypoglycemia  GI / Nutrition - Hx. Diverticulosis, GIB, AVM requiring enteroscopy - carb mod diet.   Neuro - mild dementia - lives alone and takes care of normal day/day task. Noted less awake 10/31, follows simple commands. Last night had some confusion, possibly due to hypoglycemia?  Nephro - sCr 0.79, stable. CrCl ~50, K+ 3.8,  Pulm- ongoing tobacco use - Fluticasone pta - Nebs/nicotine patch here. Acute on chr resp failure likely 2/2 combo of PNA and COPD; likely needs home O2; desats at night, on bipap while sleeping. Currently 93% on 2L. On pulmicort, atrovent, xopenex nebs  Hem/onc - CBC stable  Best Practices - Heparin, mouth care, no GI px  Goal of Therapy:  Heparin level 0.3-0.7 units/ml Monitor platelets by anticoagulation protocol: Yes   Plan:  Continue heparin drip 1300 uts/hr Follow up plan for length of anticoagulation. Daily HL, CBC   Thank you for allowing pharmacy to be a part of this patients care team.  Lovenia Kim Pharm.D., BCPS Clinical Pharmacist 05/02/2012 10:48 AM Pager: [phone] Phone: [phone]

## 2012-05-03 LAB — BASIC METABOLIC PANEL
BUN: 29 mg/dL — ABNORMAL HIGH (ref 6–23)
CO2: 32 mEq/L (ref 19–32)
Chloride: 103 mEq/L (ref 96–112)
Creatinine, Ser: 0.88 mg/dL (ref 0.50–1.10)

## 2012-05-03 LAB — GLUCOSE, CAPILLARY
Glucose-Capillary: 115 mg/dL — ABNORMAL HIGH (ref 70–99)
Glucose-Capillary: 177 mg/dL — ABNORMAL HIGH (ref 70–99)
Glucose-Capillary: 102 mg/dL — ABNORMAL HIGH (ref 70–99)
Glucose-Capillary: 192 mg/dL — ABNORMAL HIGH (ref 70–99)

## 2012-05-03 LAB — CBC
HCT: 38.3 % (ref 36.0–46.0)
MCV: 91.6 fL (ref 78.0–100.0)
RBC: 4.18 MIL/uL (ref 3.87–5.11)
RDW: 12.9 % (ref 11.5–15.5)
WBC: 8.6 10*3/uL (ref 4.0–10.5)

## 2012-05-03 MED ORDER — POTASSIUM CHLORIDE CRYS ER 20 MEQ PO TBCR
20.0000 meq | EXTENDED_RELEASE_TABLET | Freq: Every day | ORAL | Status: DC
  Administered 2012-05-03 – 2012-05-05 (×3): 20 meq via ORAL
  Filled 2012-05-03 (×5): qty 1

## 2012-05-03 NOTE — Progress Notes (Signed)
Name: [name] MRN: [id number] DOB: 01-29-1930    LOS: 12 Date of admit 04/21/2012   Referring Provider:  Dr Antoine Poche cards Reason for Referral:  Acute Respiratory failure  PULMONARY / CRITICAL CARE MEDICINE  HPI:  78 year old COPD NOS, mild dementia per chart (cognitively with capacity on 04/23/12), AVM colon hx, DM-2, PAF, osteoporosis,   patient with diastolic chf and hx of intubation in June 2010 (based on chart review) for RESISTANT ACINETOBACTER lower lobe pneumonia  ? Following ankle fracture then. Admitted with resp distress on 04/21/2012. Being treated for acute diastolic chf but without improvement despite 1400cc negative. On 04/23/12 AM noted to have decompensation with resp status overnight and needing 50% face mask and RR 24-30 but speaking full sentences. CXR c/w bilateral infiltrates. PCCM consulted 04/23/12. Patient admitted as full code but after discussion with CCM patient NO CPR but full medical care and desiring short term ventilation only (no LTAC, no trach)  Note:  Last admit to [facility] was 2010 but had one admit interim to [place], [place] DEVICES bipap 04/23/12 >> [place] ? 04/21/2012 >>  MICRO Results for orders placed during the hospital encounter of 04/21/12  MRSA PCR SCREENING     Status: Normal   Collection Time   04/21/12  5:25 PM      Component Value Range Status Comment   MRSA by PCR NEGATIVE  NEGATIVE Final     ANTIBIOTICs Anti-infectives     Start     Dose/Rate Route Frequency Ordered Stop   04/24/12 1400   cefTAZidime (FORTAZ) 1 g in dextrose 5 % 50 mL IVPB  Status:  Discontinued        1 g 100 mL/hr over 30 Minutes Intravenous 3 times per day 04/24/12 1256 05/01/12 0946   04/23/12 1700   levofloxacin (LEVAQUIN) IVPB 750 mg  Status:  Discontinued        750 mg 100 mL/hr over 90 Minutes Intravenous Every 24 hours 04/22/12 1639 04/24/12 1256   04/23/12 1400   piperacillin-tazobactam (ZOSYN) IVPB 3.375 g  Status:  Discontinued        3.375  g 100 mL/hr over 30 Minutes Intravenous 3 times per day 04/23/12 1058 04/24/12 1256   04/23/12 1200   vancomycin (VANCOCIN) IVPB 1000 mg/200 mL premix  Status:  Discontinued        1,000 mg 200 mL/hr over 60 Minutes Intravenous Every 24 hours 04/23/12 1057 04/24/12 1256   04/22/12 1630   levofloxacin (LEVAQUIN) IVPB 750 mg  Status:  Discontinued        750 mg 100 mL/hr over 90 Minutes Intravenous Every 24 hours 04/22/12 1618 04/22/12 1639         Events Since Admission: 04/21/2012 12:34 PM >>  Subjective: BiPAP  last 24 hours, currently comfortable on 6L/min [place]  Vital Signs: Temp:  [97.5 F (36.4 C)-99.4 F (37.4 C)] 98.4 F (36.9 C) (11/03 0730) Pulse Rate:  [73-89] 89  (11/03 0730) Resp:  [16-19] 19  (11/03 0730) BP: (90-109)/(46-62) 109/62 mmHg (11/03 0730) SpO2:  [89 %-97 %] 93 % (11/03 0742) Weight:  [65.5 kg (144 lb 6.4 oz)] 65.5 kg (144 lb 6.4 oz) (11/03 0500)  Intake/Output Summary (Last 24 hours) at 05/03/12 1109 Last data filed at 05/03/12 0600  Gross per 24 hour  Intake    736 ml  Output    825 ml  Net    -89 ml    Physical Examination: General:  Frail eldeerly female  Neuro:  Alert and Oriented x 3. RASS +1. GCS 15. Moves all 4 ext HEENT:  No lesions  Neck:  Supple Cardiovascular:  Regular, 90's. No murmurs Lungs:  RR 34, Acc muscle +,. Full sentences + Abdomen:  Soft, non tender Musculoskeletal:  Cachectic, No edema, No cyanosis Skin:  Intact  Dg Chest 2 View  04/22/2012  *RADIOLOGY REPORT*  Clinical Data: Follow up possible pneumonia  CHEST - 2 VIEW  Comparison: 04/21/2012  Findings: Multifocal patchy airspace opacities, stable versus mildly increased, possibly reflecting multifocal pneumonia or asymmetric interstitial edema.  Small left pleural effusion.  No pneumothorax.  The heart is top normal in size.  Degenerative changes of the visualized thoracolumbar spine.  IMPRESSION: Multifocal patchy airspace opacities, stable versus mildly increased,  possibly reflecting multifocal pneumonia or asymmetric interstitial edema.  Small left pleural effusion.   Original Report Authenticated By: Charline Bills, M.D.    Principal Problem:  *Acute and chronic respiratory failure Active Problems:  HYPERCHOLESTEROLEMIA  TOBACCO USE  HYPERTENSION  Atrial fibrillation  DIABETES MELLITUS, TYPE II  Dementia  Acute diastolic CHF (congestive heart failure)  COPD (chronic obstructive pulmonary disease)  ASSESSMENT AND PLAN  PULMONARY  Lab 04/30/12 0822 04/28/12 1200  PHART 7.405 7.471*  PCO2ART 63.1* 71.2*  PO2ART 68.0* 49.0*  HCO3 39.5* 52.0*  O2SAT 93.0 84.0   Ventilator Settings:   A:  Acute on chronic resp failure due to COPD, CHF +/- PNA. Spoke with the patient extensively and record reviewed.  Hypoxemia is likely a combination of PNA and COPD with pulmonary edema as a contributing but not major factor.  The interesting part is the patient desats at night more than during the day and falls asleep easily during the day.  OSA is likely a major contributing factor here.  Confusion and airway protection are much improved. P:   - BiPAP when asleep only, will need home BiPAP. - PO lasix 40 daily as ordered. - Finished course of abx. - Xopenex + Atrovent + Pulmicort as ordered. - CSW to arrange for home O2 and home BiPAP.  CARDIOVASCULAR  Lab 04/27/12 0615  TROPONINI --  LATICACIDVEN --  PROBNP 1311.0*   A: Acute diastolic CHF A fib w controlled rate P:  Restarted lasix PO doses. Diltiazem to po. ASA. Atorvastatin.  RENAL  Lab 05/03/12 0530 05/02/12 0520 05/01/12 0540 04/30/12 0440 04/29/12 0455  NA 141 141 139 142 138  K 3.9 4.0 -- -- --  CL 103 103 98 98 92*  CO2 32 33* 36* 38* 42*  BUN 29* 27* 22 25* 25*  CREATININE 0.88 0.86 0.79 0.82 0.83  CALCIUM 10.1 10.8* 11.1* 11.4* 11.2*  MG -- 1.9 2.0 2.2 --  PHOS -- 2.9 3.1 3.7 --   Intake/Output      11/02 0701 - 11/03 0700 11/03 0701 - 11/04 0700   P.O. 360    I.V.  (mL/kg) 542 (8.3)    Total Intake(mL/kg) 902 (13.8)    Urine (mL/kg/hr) 1025 (0.7)    Total Output 1025    Net -123           Intake/Output Summary (Last 24 hours) at 05/03/12 1109 Last data filed at 05/03/12 0600  Gross per 24 hour  Intake    736 ml  Output    825 ml  Net    -89 ml   A:  Hypokalemia P:   - PO lasix at current dose is keeping patient even (I believe she is already optimized  weight wise and does not need to be aggressively diuresed further). - D/C diamox. - K-Dur 20 meq PO daily ordered. - BMET in AM.  GASTROINTESTINAL No results found for this basename: AST:5,ALT:5,ALKPHOS:5,BILITOT:5,PROT:5,ALBUMIN:5 in the last 168 hours  A:  nutrition P:  Heart healthy diet.  HEMATOLOGIC  Lab 05/03/12 0530 05/02/12 0520 05/01/12 0540 04/30/12 0440 04/29/12 0455  HGB 12.4 12.8 13.2 12.9 13.1  HCT 38.3 39.7 41.4 40.0 40.5  PLT 320 311 289 299 286  INR -- -- -- -- --  APTT -- -- -- -- --   A:  AT risk for anemia criticl illness P:  - PRBC for hgb </= 7.0 gm.  INFECTIOUS  Lab 05/03/12 0530 05/02/12 0520 05/01/12 0540 04/30/12 0440 04/29/12 0455  WBC 8.6 8.9 9.1 10.4 10.8*  PROCALCITON -- -- -- -- --   Cultures: Results for orders placed during the hospital encounter of 04/21/12  MRSA PCR SCREENING     Status: Normal   Collection Time   04/21/12  5:25 PM      Component Value Range Status Comment   MRSA by PCR NEGATIVE  NEGATIVE Final    Antibiotics: Anti-infectives     Start     Dose/Rate Route Frequency Ordered Stop   04/24/12 1400   cefTAZidime (FORTAZ) 1 g in dextrose 5 % 50 mL IVPB  Status:  Discontinued        1 g 100 mL/hr over 30 Minutes Intravenous 3 times per day 04/24/12 1256 05/01/12 0946   04/23/12 1700   levofloxacin (LEVAQUIN) IVPB 750 mg  Status:  Discontinued        750 mg 100 mL/hr over 90 Minutes Intravenous Every 24 hours 04/22/12 1639 04/24/12 1256   04/23/12 1400   piperacillin-tazobactam (ZOSYN) IVPB 3.375 g  Status:  Discontinued         3.375 g 100 mL/hr over 30 Minutes Intravenous 3 times per day 04/23/12 1058 04/24/12 1256   04/23/12 1200   vancomycin (VANCOCIN) IVPB 1000 mg/200 mL premix  Status:  Discontinued        1,000 mg 200 mL/hr over 60 Minutes Intravenous Every 24 hours 04/23/12 1057 04/24/12 1256   04/22/12 1630   levofloxacin (LEVAQUIN) IVPB 750 mg  Status:  Discontinued        750 mg 100 mL/hr over 90 Minutes Intravenous Every 24 hours 04/22/12 1618 04/22/12 1639         A:  ? HCAP - Pct, WBC, clinical hx argue against. Has a hx of resistant acinitobacter from 2010 P:   - Cultures sent on 1028 and are pending. - Prior cx's acinitobacter resistant to zosyn (high MIC) and levaquin, intermediate to ceftaz, ceftriaxone, tobra. D/Ced abx on 11/1 and seems to be doing well off abx, no indication to restart right now.  ENDOCRINE  Lab 05/03/12 0740 05/02/12 2220 05/02/12 1651 05/02/12 1216 05/02/12 0751  GLUCAP 115* 112* 141* 112* 110*   A:  Hyperglycemia, hx DM, persistent hypoglycemia in the morning. P:   - D/Ced glyburide with adequate BS (no further hypoglycemia). - Continue metformin (does not cause hypoglycemia). - No long acting lantus. - ISS.  NEUROLOGIC A:  Sporadic deterioration in mental status, cause remains unclear to me.  More likely to be hypoglycemia.  See above.  Exam now is non-focal, no need for head CT.  Baseline dementia but much improved after normalization of CO2/HCO3 and no further episodes of hypoglycemia with d/c of glyburide. P:   - Minimize  sedation. - No further glyburide.  PCCM will sign off, please call back if needed.  Abx course complete, left on lasix 40 mg PO daily and K-dur 20 meq PO daily, BMET for AM ordered to see if doses are appropriate, will need home O2 at 2L 24/7 and home BiPAP for when asleep.  Will need a sleep study and full PFTs as outpatient so please arrange for outpatient f/u with PCCM in 2 wks post discharge.  Alyson Reedy, M.D. [facility]  Pulmonary/Critical Care Medicine. Pager: [phone]. After hours pager: [phone].

## 2012-05-03 NOTE — Progress Notes (Signed)
Patient: [name] / Admit Date: 04/21/2012 / Date of Encounter: 05/03/2012, 9:25 AM   Subjective  78 yo admitted with COPD exacerbation. More alert today.    Objective   Telemetry: afib controlled rate, Physical Exam: Filed Vitals:   05/01/12 0600  BP: 101/48  Pulse: 74  Temp: 98.1  Resp: 21   General: Well developed elderly WF in no acute distress. Head: Normocephalic, atraumatic, sclera non-icteric, no xanthomas, nares are without discharge. Neck: JVD not elevated. Lungs: rhonchi L>>R.  Breathing is unlabored. Heart: irregularly irregular, S1 S2 without murmurs, rubs, or gallops.  Abdomen: Soft, non-tender, non-distended with normoactive bowel sounds. No hepatomegaly. No rebound/guarding. No obvious abdominal masses. Msk:  Strength and tone appear normal for age. Extremities: No clubbing or cyanosis. No edema.  Distal pedal pulses are 2+ and equal bilaterally. Neuro: nonfocal  Psych:  Responds to questions as above with a normal affect.    Intake/Output Summary (Last 24 hours) at 05/03/12 0925 Last data filed at 05/03/12 0600  Gross per 24 hour  Intake    736 ml  Output   1025 ml  Net   -289 ml    Inpatient Medications:     . antiseptic oral rinse  15 mL Mouth Rinse BID  . aspirin  81 mg Oral Daily  . atorvastatin  10 mg Oral q1800  . budesonide  0.25 mg Nebulization BID  . digoxin  0.125 mg Oral Daily  . diltiazem  240 mg Oral Daily  . furosemide  40 mg Oral Daily  . insulin aspart  0-15 Units Subcutaneous TID WC  . insulin aspart  0-5 Units Subcutaneous QHS  . insulin aspart  3 Units Subcutaneous TID WC  . ipratropium  0.5 mg Nebulization QID  . levalbuterol  0.63 mg Nebulization QID  . metFORMIN  500 mg Oral BID AC  . multivitamin with minerals  1 tablet Oral Daily  . nicotine  21 mg Transdermal Daily    Labs:  [facility] 05/03/12 0530 05/02/12 0520 05/01/12 0540  NA 141 141 --  K 3.9 4.0 --  CL 103 103 --  CO2 32 33* --  GLUCOSE 111* 127* --   BUN 29* 27* --  CREATININE 0.88 0.86 --  CALCIUM 10.1 10.8* --  MG -- 1.9 2.0  PHOS -- 2.9 3.1    Basename 05/03/12 0530 05/02/12 0520  WBC 8.6 8.9  NEUTROABS -- --  HGB 12.4 12.8  HCT 38.3 39.7  MCV 91.6 92.5  PLT 320 311    Radiology/Studies:  Dg Chest 2 View  04/22/2012  *RADIOLOGY REPORT*  Clinical Data: Follow up possible pneumonia  CHEST - 2 VIEW  Comparison: 04/21/2012  Findings: Multifocal patchy airspace opacities, stable versus mildly increased, possibly reflecting multifocal pneumonia or asymmetric interstitial edema.  Small left pleural effusion.  No pneumothorax.  The heart is top normal in size.  Degenerative changes of the visualized thoracolumbar spine.  IMPRESSION: Multifocal patchy airspace opacities, stable versus mildly increased, possibly reflecting multifocal pneumonia or asymmetric interstitial edema.  Small left pleural effusion.   Original Report Authenticated By: Charline Bills, M.D.    Dg Chest Port 1 View  04/30/2012  *RADIOLOGY REPORT*  Clinical Data: Respiratory failure, history of pneumonia  PORTABLE CHEST - 1 VIEW  Comparison: 04/25/2012  Findings: Cardiomegaly again noted.  Persistent small left pleural effusion with left basilar atelectasis or infiltrate.  No pulmonary edema.  Patchy airspace disease in the right lower lobe probable residual pneumonia.  Follow-up  to resolution is recommended.  IMPRESSION:  Persistent small left pleural effusion with left basilar atelectasis or infiltrate.  No pulmonary edema.  Patchy airspace disease in the right lower lobe probable residual pneumonia. Follow-up to resolution is recommended.   Original Report Authenticated By: Natasha Mead, M.D.      Assessment and Plan  1. Afib (h/o such) - rates controlled, rare pause. Con't current rx. Remains on  heparin gtt - not on Coumadin as OP due to hx of AVM/GIB -   I will continue Heparin until tomorrow and will have Dr. Antoine Poche address it.  I dont think she would benefit  from cardioversion given her pulmonary disease and the likelihood of her going back into AF. 2. Acute on chronic resp failure - ?COPD, CHF, +/- PNA (hx of resistant acinitobacter 2010) - appreciate pulm assistance. 3. Fluctuating mental status - felt possibly r/t hypoglycemia. Sedation minimized. Has dementia so unclear baseline. Still somewhat confused. ?Neuro eval 4. Acute on chronic diastolic CHF - Lasix held. Weight slightly up, but appears euvolemic on exam. 5. Hypercalemia - may need medicine workup esp in light of #3. 6. Hypoglycemia - improved this AM.   Elyn Aquas., MD 05/03/2012 9:25 AM

## 2012-05-03 NOTE — Progress Notes (Signed)
ANTICOAGULATION CONSULT NOTE - Follow Up Consult  Pharmacy Consult for Heparin Indication: atrial fibrillation  Allergies  Allergen Reactions  . Azithromycin     REACTION: inc blood sugar  . Codeine Itching  . Coumadin (Warfarin) Other (See Comments)    Causes bleeding  . Oxybutynin Chloride Er     Constipation/ dizziness/ dry mouth    Patient Measurements: Height: 5\' 6"  (167.6 cm) Weight: 144 lb 6.4 oz (65.5 kg) IBW/kg (Calculated) : 59.3  Heparin Dosing Weight: 66 kg  Vital Signs: Temp: 98.4 F (36.9 C) (11/03 0730) Temp src: Oral (11/03 0730) BP: 109/62 mmHg (11/03 0730) Pulse Rate: 89  (11/03 0730)  Labs:  Basename 05/03/12 0530 05/02/12 0520 05/01/12 1507 05/01/12 0540  HGB 12.4 12.8 -- --  HCT 38.3 39.7 -- 41.4  PLT 320 311 -- 289  APTT -- -- -- --  LABPROT -- -- -- --  INR -- -- -- --  HEPARINUNFRC 0.68 0.56 0.27* --  CREATININE 0.88 0.86 -- 0.79  CKTOTAL -- -- -- --  CKMB -- -- -- --  TROPONINI -- -- -- --    Estimated Creatinine Clearance: 47.7 ml/min (by C-G formula based on Cr of 0.88).    78yo female admitted 04/21/2012 with hx of PAF - with SOB and c/o cough. Pharmacy consulted to dose heparin  Not on Coumadin prior to admission due to hx AVMs and GIB, along with baseline dementia.  Assessment: AC -A fib  IV Heparin -On heparin since 10/22. ? duration of tx. HL at goal , No bleeding noted. CBC stable  ID - HCAP resolved, Afeb WBC wnl  10/24 Vanc>>10/25 10/24 Zosyn>>10/25 10/24 Levo>>10/25 10/25 Ceftaz>>11/1  10/22 - MRSA screen neg 10/24 - strep pneumo & Legionella neg 10/29 - resp cx hx resistant acinetobacter pna in 2010  CV- Hx. HTN/Hyperlipidemia, PAF, Carotid Stenosis, diastolic CHF  EF 16-10% per echo 10/23: dilt CD, asa, lipitor (amlodipine, lisinopril pta but BP low),digoxin  0.125mg  PO daily started 10/28. BPs slightly low, HR 70-80. Po lasix  Endo - + DM (A1c 7.6), on SSI, metformin started, CBG controlled Glyburide d/c'd due  to mental status changes poss due to hypoglycemia  GI / Nutrition - Hx. Diverticulosis, GIB, AVM requiring enteroscopy - carb mod diet.   Neuro - mild dementia - lives alone and takes care of normal day/day task.  Nephro - sCr , stable. CrCl ~50, K+ 3.9,  Pulm- ongoing tobacco use - Fluticasone pta - Nebs/nicotine patch here. Acute on chr resp failure likely 2/2 combo of PNA and COPD; likely needs home O2; desats at night, on bipap while sleeping. Currently 93% on 2L. On pulmicort, atrovent, xopenex nebs  Hem/onc - CBC stable  Best Practices - Heparin, mouth care, no GI px  Goal of Therapy:  Heparin level 0.3-0.7 units/ml Monitor platelets by anticoagulation protocol: Yes   Plan:  Continue heparin drip 1300 uts/hr Follow up plan for length of anticoagulation. Daily HL, CBC   Thank you for allowing pharmacy to be a part of this patients care team.  Lovenia Kim Pharm.D., BCPS Clinical Pharmacist 05/03/2012 10:33 AM Pager: [phone] Phone: [phone]

## 2012-05-03 NOTE — Progress Notes (Signed)
Pt tolerated the BiPap for 2 1/2 hours again tonight.  Pt cooperative, but states she cannot take the mask for long periods of times; pt placed on [place] 4L; her O2 sat 97%; will continue to monitor

## 2012-05-04 LAB — GLUCOSE, CAPILLARY
Glucose-Capillary: 101 mg/dL — ABNORMAL HIGH (ref 70–99)
Glucose-Capillary: 121 mg/dL — ABNORMAL HIGH (ref 70–99)

## 2012-05-04 LAB — HEPARIN LEVEL (UNFRACTIONATED): Heparin Unfractionated: 0.66 IU/mL (ref 0.30–0.70)

## 2012-05-04 LAB — CBC
MCHC: 32.3 g/dL (ref 30.0–36.0)
RDW: 12.8 % (ref 11.5–15.5)

## 2012-05-04 LAB — BASIC METABOLIC PANEL
Calcium: 9.7 mg/dL (ref 8.4–10.5)
GFR calc non Af Amer: 68 mL/min — ABNORMAL LOW (ref >90)
Sodium: 144 mEq/L (ref 135–145)

## 2012-05-04 NOTE — Progress Notes (Signed)
Occupational Therapy Treatment Patient Details Name: [name] MRN: [id number] DOB: 11-06-1929 Today's Date: 05/04/2012 Time: 5621-3086 OT Time Calculation (min): 12 min  OT Assessment / Plan / Recommendation Comments on Treatment Session Pt motivated to participate in session. Able to ambulate to sink with min (A) and perform grooming tasks with min guard.      Follow Up Recommendations  Skilled nursing facility    Barriers to Discharge       Equipment Recommendations  None recommended by OT    Recommendations for Other Services    Frequency Min 2X/week   Plan Discharge plan remains appropriate    Precautions / Restrictions Precautions Precautions: Fall   Pertinent Vitals/Pain No pain reported by pt    ADL  Grooming: Performed;Wash/dry hands;Wash/dry face;Min guard Where Assessed - Grooming: Unsupported standing Toilet Transfer: Simulated;Minimal Dentist Method: Sit to Barista: Other (comment) (from bed) Equipment Used: Gait belt Transfers/Ambulation Related to ADLs: Min (A) for sit to stand and min guard for stand to sit. Requires min (A) during ambulation. Pt uses environment as support while ambulating.  ADL Comments: completed bed mobility at supervision level, required no physical (A). Ambulated to sink with min (A) using the environment as a support. Pt washed her hands and face standing at sink unsupported with min guard for safety. Pt stated she did not feel short of breath during session.     OT Diagnosis:    OT Problem List:   OT Treatment Interventions:     OT Goals Acute Rehab OT Goals OT Goal Formulation: With patient Time For Goal Achievement: 05/11/12 Potential to Achieve Goals: Good ADL Goals Pt Will Perform Grooming: with min assist;Standing at sink ADL Goal: Grooming - Progress: Met Pt Will Perform Lower Body Bathing: with min assist;Standing at sink;Sitting at sink;Sit to stand from chair Pt Will  Perform Lower Body Dressing: with min assist;Sit to stand from chair Pt Will Perform Tub/Shower Transfer: Shower transfer;with min assist;Shower seat with back Additional ADL Goal #1: Pt will complete all aspects of toileting on comfort heigh commode with s. ADL Goal: Additional Goal #1 - Progress: Progressing toward goals  Visit Information  Last OT Received On: 05/04/12 Assistance Needed: +1    Subjective Data      Prior Functioning       Cognition  Overall Cognitive Status: History of cognitive impairments - at baseline Area of Impairment: Safety/judgement;Memory Arousal/Alertness: Awake/alert Behavior During Session: [facility] for tasks performed Safety/Judgement: Decreased safety judgement for tasks assessed    Mobility  Shoulder Instructions Bed Mobility Bed Mobility: Supine to Sit;Sitting - Scoot to [place] of Bed;Sit to Supine Supine to Sit: 5: Supervision;HOB elevated Sitting - Scoot to Edge of Bed: 5: Supervision Sit to Supine: 5: Supervision;HOB elevated Details for Bed Mobility Assistance: required no physical (A) but needed cues for sequencing and positioning once back in bed. . Transfers Transfers: Sit to Stand;Stand to Sit Sit to Stand: 4: Min assist;With upper extremity assist;From bed Stand to Sit: 4: Min guard;With upper extremity assist;To bed Details for Transfer Assistance: (A) for balance once standing                  End of Session OT - End of Session Equipment Utilized During Treatment: Gait belt Activity Tolerance: Patient tolerated treatment well Patient left: in bed;with call bell/phone within reach;with family/visitor present Nurse Communication: Mobility status  GO     Cleora Fleet 05/04/2012, 3:13 PM

## 2012-05-04 NOTE — Progress Notes (Signed)
Physical Therapy Treatment Patient Details Name: [name] MRN: [id number] DOB: 11-Oct-1929 Today's Date: 05/04/2012 Time: 1533-1610 PT Time Calculation (min): 37 min  PT Assessment / Plan / Recommendation Comments on Treatment Session  Ms. Barbuto is making progress towards goals. Does need O2 to ambulate.     Follow Up Recommendations  Post acute inpatient     Does the patient have the potential to tolerate intense rehabilitation  No, Recommend SNF  Barriers to Discharge        Equipment Recommendations  Rolling walker with 5" wheels    Recommendations for Other Services    Frequency     Plan Frequency remains appropriate;Discharge plan needs to be updated    Precautions / Restrictions Precautions Precautions: Fall Restrictions Weight Bearing Restrictions: No       Mobility  Bed Mobility Bed Mobility: Supine to Sit Supine to Sit: 5: Supervision Sitting - Scoot to Edge of Bed: 5: Supervision Sit to Supine: 5: Supervision;HOB elevated Details for Bed Mobility Assistance: cues for safety Transfers Transfers: Sit to Stand;Stand to Sit Sit to Stand: 4: Min assist;From bed;With upper extremity assist Stand to Sit: 4: Min assist;With upper extremity assist;To chair/3-in-1 Details for Transfer Assistance: cues for safe hand placement and safe technique (pt backing up to chair without keeping RW, not reaching back for chair) Ambulation/Gait Ambulation/Gait Assistance: 4: Min assist Ambulation Distance (Feet): 200 Feet Assistive device: Rolling walker Ambulation/Gait Assistance Details: cues for tall posture, 2 standing rest breaks and one seated rest break to recover SaO2 >90%, cues for safety with RW and negotiation as she became distracted and ran into walls and doors on the right; pt initially ambulating on RA with sats around 87%, even with rest breaks and breathing techniques pt remained at or around 88%, pt put on 3 liters of O2 and it increased to 92% and remained at  or above 91% for another 20 ft of ambulation Gait Pattern: Trunk flexed;Shuffle General Gait Details: turned out foot posture      PT Goals Acute Rehab PT Goals PT Goal: Supine/Side to Sit - Progress: Progressing toward goal PT Goal: Sit to Stand - Progress: Progressing toward goal PT Goal: Stand to Sit - Progress: Progressing toward goal PT Goal: Ambulate - Progress: Progressing toward goal  Visit Information  Last PT Received On: 05/04/12 Assistance Needed: +2 (assist with lines and chair follow if needed)    Subjective Data  Subjective: I just love therapy!   Cognition  Overall Cognitive Status: Impaired Area of Impairment: Attention;Safety/judgement;Awareness of deficits Arousal/Alertness: Awake/alert Orientation Level: Appears intact for tasks assessed Behavior During Session: [facility] for tasks performed Current Attention Level: Selective;Alternating Attention - Other Comments: easily distracted by her surroundings needing cues to attend to herself and her safety, running RW into walls Following Commands: Follows one step commands consistently Safety/Judgement: Decreased safety judgement for tasks assessed    Balance     End of Session PT - End of Session Equipment Utilized During Treatment: Gait belt Activity Tolerance: Patient tolerated treatment well Patient left: in chair;with call bell/phone within reach Nurse Communication: Mobility status   GP     [facility] HELEN 05/04/2012, 4:53 PM

## 2012-05-04 NOTE — Progress Notes (Signed)
ANTICOAGULATION CONSULT NOTE - Follow Up Consult  Pharmacy Consult for Heparin Indication: atrial fibrillation  Allergies  Allergen Reactions  . Azithromycin     REACTION: inc blood sugar  . Codeine Itching  . Coumadin (Warfarin) Other (See Comments)    Causes bleeding  . Oxybutynin Chloride Er     Constipation/ dizziness/ dry mouth    Patient Measurements: Height: 5\' 6"  (167.6 cm) Weight: 139 lb 8.8 oz (63.3 kg) (scale a) IBW/kg (Calculated) : 59.3  Heparin Dosing Weight: 63 kg  Vital Signs: Temp: 98.1 F (36.7 C) (11/04 0412) Temp src: Oral (11/04 0412) BP: 123/77 mmHg (11/04 1026) Pulse Rate: 90  (11/04 1028)  Labs:  Basename 05/04/12 0515 05/03/12 0530 05/02/12 0520  HGB 12.9 12.4 --  HCT 39.9 38.3 39.7  PLT 329 320 311  APTT -- -- --  LABPROT -- -- --  INR -- -- --  HEPARINUNFRC 0.66 0.68 0.56  CREATININE 0.80 0.88 0.86  CKTOTAL -- -- --  CKMB -- -- --  TROPONINI -- -- --    Estimated Creatinine Clearance: 52.5 ml/min (by C-G formula based on Cr of 0.8).  Assessment:  Heparin level remains therapeutic on 1300 units/hr.  CBC stable. No bleeding noted.  On IV heparin since 10/22..  Not on Coumadin prior to admission due to hx AVMs and GIB, along with baseline dementia.  Goal of Therapy:  Heparin level 0.3-0.7 units/ml Monitor platelets by anticoagulation protocol: Yes   Plan:   Continue heparin drip at 1300 units/hr.  Continue daily heparin level and CBC.  Will follow up anticoag plans.    Dennie Fetters, RPh Pager: [phone] 05/04/2012,11:48 AM

## 2012-05-04 NOTE — Progress Notes (Signed)
Cardiology Progress Note Patient Name: [name] Date of Encounter: 05/04/2012, 9:43 AM     Subjective  No overnight events. Patient is alert and oriented x3. Did not wear CPAP last night. Denies chest pain, palpitations or sob.   Objective   Telemetry: A.fib 60-80s  Medications: . antiseptic oral rinse  15 mL Mouth Rinse BID  . aspirin  81 mg Oral Daily  . atorvastatin  10 mg Oral q1800  . budesonide  0.25 mg Nebulization BID  . digoxin  0.125 mg Oral Daily  . diltiazem  240 mg Oral Daily  . furosemide  40 mg Oral Daily  . insulin aspart  0-15 Units Subcutaneous TID WC  . insulin aspart  0-5 Units Subcutaneous QHS  . insulin aspart  3 Units Subcutaneous TID WC  . ipratropium  0.5 mg Nebulization QID  . levalbuterol  0.63 mg Nebulization QID  . metFORMIN  500 mg Oral BID AC  . multivitamin with minerals  1 tablet Oral Daily  . nicotine  21 mg Transdermal Daily  . potassium chloride  20 mEq Oral Daily   . sodium chloride 10 mL/hr at 05/03/12 1200  . heparin 1,300 Units/hr (05/03/12 2217)    Physical Exam: Temp:  [97 F (36.1 C)-98.3 F (36.8 C)] 98.1 F (36.7 C) (11/04 0412) Pulse Rate:  [67-99] 87  (11/04 0412) Resp:  [18-22] 22  (11/04 0412) BP: (103-128)/(56-99) 109/64 mmHg (11/04 0412) SpO2:  [94 %-96 %] 95 % (11/04 0824) Weight:  [139 lb 8.8 oz (63.3 kg)-143 lb 1.6 oz (64.91 kg)] 139 lb 8.8 oz (63.3 kg) (11/04 4098)  General: Pleasant elderly female, in no acute distress. Head: Normocephalic, atraumatic, sclera non-icteric, nares are without discharge.  Neck: Supple. Negative for carotid bruits or JVD Lungs: Diminished throughout. Fine bibasilar rales. No wheezes or rhonchi. Breathing is unlabored on supplemental O2 via nasal cannula. Heart: Irregularly irregular S1 S2 without murmurs, rubs, or gallops.  Abdomen: Soft, non-tender, non-distended with normoactive bowel sounds. No rebound/guarding. No obvious abdominal masses. Msk:  Strength and tone  appear normal for age. Extremities: No edema. No clubbing or cyanosis. Distal pedal pulses are intact and equal bilaterally. Neuro: Alert and oriented X 3. Moves all extremities spontaneously. Psych:  Responds to questions appropriately with a normal affect.   Intake/Output Summary (Last 24 hours) at 05/04/12 0943 Last data filed at 05/03/12 2100  Gross per 24 hour  Intake    332 ml  Output    950 ml  Net   -618 ml    Labs:  [facility] 05/04/12 0515 05/03/12 0530 05/02/12 0520  NA 144 141 --  K 4.0 3.9 --  CL 102 103 --  CO2 35* 32 --  GLUCOSE 119* 111* --  BUN 26* 29* --  CREATININE 0.80 0.88 --  CALCIUM 9.7 10.1 --  MG -- -- 1.9  PHOS -- -- 2.9   Basename 05/04/12 0515 05/03/12 0530  WBC 8.7 8.6  HGB 12.9 12.4  HCT 39.9 38.3  MCV 92.1 91.6  PLT 329 320   Radiology/Studies:   04/22/12 - Echo Study Conclusions: - Left ventricle: The cavity size was normal. Systolic function was normal. The estimated ejection fraction was in the range of 55% to 60%. Wall motion was normal; there were no regional wall motion abnormalities.  - Mitral valve: Mild regurgitation. - Left atrium: The atrium was moderately dilated. - Right atrium: The atrium was mildly dilated. - Atrial septum: No  defect or patent foramen ovale was identified. - Pulmonary arteries: PA peak pressure: 38mm Hg (S). - Pericardium, extracardiac: A trivial pericardial effusion was identified posterior to the heart.  04/30/2012 - PORTABLE CHEST - 1 VIEW Findings: Cardiomegaly again noted.  Persistent small left pleural effusion with left basilar atelectasis or infiltrate.  No pulmonary edema.  Patchy airspace disease in the right lower lobe probable residual pneumonia.  Follow-up to resolution is recommended.  IMPRESSION:  Persistent small left pleural effusion with left basilar atelectasis or infiltrate.  No pulmonary edema.  Patchy airspace disease in the right lower lobe probable residual pneumonia. Follow-up to  resolution is recommended.      Assessment and Plan   1. Acute on Chronic Respiratory Failure: Multifactorial in the setting of COPD, PNA, CHF, and ?OSA. Respiratory status stable. PCCM rec BiPAP when sleeping with home BiPAP & O2 (O2 at 2L 24/7), as well as a sleep study and PFTs as outpatient. Will need f/u w/ PCCM 2wks post discharge. Abx dc'd 11/1. Cont nebs as ordered by PCCM. Diuresis transitioned to po lasix.  2. Suspected Healthcare Associated Pneumonia: Abx stopped 11/1. Afebrile without leukocytosis.   3. Acute on Chronic Diastolic CHF: EF 09-81% by echo 10/23. Lasix transitioned to oral dosing 11/1. Currently on 40mg  daily. I/Os net (-) 4L. Weight down 11lbs (150-->139lbs). Euvolemic on exam.   4. Fluctuating Mental Status: Has dementia at baseline, but has had a sporadic deterioration in mental status. Previous notes suggest it was related to hypoglycemia. She is alert and oriented x3 today. No focal neuro deficits.   5. Atrial Fibrillation w/ RVR: H/o PAF. Coumadin dc'd 2006 due to h/o AVM/GIB. Rate controlled on digoxin, diltiazem. Currently on heparin drip. MD to address appropriateness of chronic anticoagulation.  6. Diabetes Mellitus with hypoglycemia: Glyburide & Lantus dc'd. Cont metformin and SSI with close monitoring of CBGs.  7. Metabolic Alkalosis: Resolved.  8. Hypokalemia: Resolved  9. Hypercalcemia: Resolved  10. Hypertension: SBP 100-120s  11. COPD w/ ongoing Tobacco abuse: Home O2, MDIs, and Nebs as above. Nicotine patch.  Disposition: When ready will be dc'd to SNF  Signed, HOPE, JESSICA PA-C  History and all data above reviewed.  Patient examined.  I agree with the findings as above.  Very clear this am.  Probably can be discharged in the am to nursing home.  The patient exam reveals [id number]  ,  Lungs: Clear  ,  Abd: Positive bowel sounds, no rebound no guarding, Ext No edema  .  All available labs, radiology testing, previous records reviewed. Agree  with documented assessment and plan. We will discuss with the social worker and make plans for discharge to a nursing home tomorrow.    Fayrene Fearing Skyeler Smola  11:42 AM  05/04/2012

## 2012-05-04 NOTE — Progress Notes (Signed)
I agree with the following treatment note after reviewing documentation.   Johnston, Barnabas Henriques Brynn   OTR/L Pager: [phone] Office: [phone] .   

## 2012-05-05 ENCOUNTER — Encounter (HOSPITAL_COMMUNITY): Payer: Self-pay | Admitting: Cardiology

## 2012-05-05 LAB — CBC
HCT: 35.9 % — ABNORMAL LOW (ref 36.0–46.0)
MCH: 29.9 pg (ref 26.0–34.0)
MCHC: 32.9 g/dL (ref 30.0–36.0)
MCV: 91.1 fL (ref 78.0–100.0)
RDW: 12.9 % (ref 11.5–15.5)

## 2012-05-05 LAB — GLUCOSE, CAPILLARY
Glucose-Capillary: 87 mg/dL (ref 70–99)
Glucose-Capillary: 120 mg/dL — ABNORMAL HIGH (ref 70–99)
Glucose-Capillary: 147 mg/dL — ABNORMAL HIGH (ref 70–99)

## 2012-05-05 LAB — BASIC METABOLIC PANEL
BUN: 29 mg/dL — ABNORMAL HIGH (ref 6–23)
GFR calc Af Amer: 79 mL/min — ABNORMAL LOW (ref >90)
Glucose, Bld: 134 mg/dL — ABNORMAL HIGH (ref 70–99)
Sodium: 141 mEq/L (ref 135–145)

## 2012-05-05 MED ORDER — LEVALBUTEROL TARTRATE 45 MCG/ACT IN AERO: 2.0000 | INHALATION_SPRAY | RESPIRATORY_TRACT | Status: DC | PRN

## 2012-05-05 MED ORDER — BUDESONIDE 90 MCG/ACT IN AEPB: 2.0000 | INHALATION_SPRAY | Freq: Two times a day (BID) | RESPIRATORY_TRACT | Status: DC

## 2012-05-05 MED ORDER — DIGOXIN 125 MCG PO TABS: 0.1250 mg | ORAL_TABLET | Freq: Every day | ORAL | Status: DC

## 2012-05-05 MED ORDER — IPRATROPIUM BROMIDE HFA 17 MCG/ACT IN AERS: 2.0000 | INHALATION_SPRAY | Freq: Four times a day (QID) | RESPIRATORY_TRACT | Status: DC

## 2012-05-05 MED ORDER — DILTIAZEM HCL ER COATED BEADS 240 MG PO CP24: 240.0000 mg | ORAL_CAPSULE | Freq: Every day | ORAL | Status: DC

## 2012-05-05 MED ORDER — FUROSEMIDE 40 MG PO TABS: 40.0000 mg | ORAL_TABLET | Freq: Every day | ORAL | Status: DC

## 2012-05-05 MED ORDER — METFORMIN HCL 500 MG PO TABS: 500.0000 mg | ORAL_TABLET | Freq: Two times a day (BID) | ORAL | Status: DC

## 2012-05-05 MED ORDER — POTASSIUM CHLORIDE CRYS ER 20 MEQ PO TBCR: 20.0000 meq | EXTENDED_RELEASE_TABLET | Freq: Every day | ORAL | Status: DC

## 2012-05-05 MED ORDER — NICOTINE 21 MG/24HR TD PT24: MEDICATED_PATCH | TRANSDERMAL | Status: DC

## 2012-05-05 NOTE — Progress Notes (Signed)
Clinical social worker assisted with patient discharge to skilled nursing facility, [facility] [place].  CSW addressed all family questions and concerns. CSW copied chart and added all important documents. CSW also set up patient transportation with [facility]. Clinical Social Worker will sign off for now as social work intervention is no longer needed.   Wynne Rozak, MSW, LCSWA [phone] 

## 2012-05-05 NOTE — Discharge Summary (Signed)
Discharge Summary   Patient ID: TA GAME MRN: [id number], DOB/AGE: 1929/08/26 78 y.o.  Primary MD: Roxy Manns, MD Primary Cardiologist: Dr. Antoine Poche Admit date: 04/21/2012 D/C date:     05/05/2012      Primary Discharge Diagnoses:  1. Acute on Chronic Respiratory Failure  - Multifactorial in the setting of A.Fib w/ RVR, COPD, PNA, CHF, and ?OSA  - Outpt f/u w/ PCCM w/ recs for BiPAP with sleeping, home O2 (2L [place] continuous), f/u sleep study and PFTs  2. Healthcare Associated Pneumonia  - Completed abx  3. Acute on Chronic Diastolic CHF  - EF 55-60% by echo 10/23  - Weight 150-->143lbs  - Dc on Lasix 40mg  daily  4. Fluctuating Mental Status  - Likely 2/2 hypercarbia and hypoglycemia, Resolved   5. Atrial Fibrillation w/ RVR  - H/o PAF. Not anticoagulation candidate due to h/o AVM/GIB, dementia, and fall risk  - Conservative therapy with rate control  6. Diabetes Mellitus Type 2, Uncontrolled, with hypoglycemia  - Glyburide & Lantus dc'd. Cont metformin   - F/u w/ PCP  7. Normocytic Anemia  - Hgb 12.9-->11.8, no signs of active bleeding  - F/u CBC on 05/08/12 and have results sent to her primary care provider  8. Hypokalemia, Resolved  9. Hypercalcemia, Resolved  10. COPD w/ ongoing Tobacco abuse  - Home O2, MDIs, Nebs, Nicotine patch  11. Metabolic Alkalosis, Resolved   Secondary Discharge Diagnoses:  . Allergy     allergic Rhintis  . Hyperlipidemia   . Hypertension   . Osteoporosis   . PAF (paroxysmal atrial fibrillation)     a. PAF dates back > 5 yrs, prev on coumadin->d/c 2006 2/2 GIB;   . Carotid stenosis     a. 09/2007: Carotid Doppler stable- stable bilateral stenosis 40-59% (from 06)  . Type II diabetes mellitus   . Tobacco abuse     a. ongoing - 50+ pack yr hx.  . Dementia     Dementia in Hospital //Does O.K. at home.  . Diverticulosis   . GI bleeding     a. when on coumadin in 2006 - colonoscopy @ that time showed 2 large right colon  avm's, multiple small bowel avm's, fresh blood in prox small bowel.  . AVM (arteriovenous malformation) of colon     a. 2006 colonscopy    Allergies Allergies  Allergen Reactions  . Azithromycin     REACTION: inc blood sugar  . Codeine Itching  . Coumadin (Warfarin) Other (See Comments)    Causes bleeding  . Oxybutynin Chloride Er     Constipation/ dizziness/ dry mouth    Diagnostic Studies/Procedures:   04/22/2012 - Modified Barium Swallowing Study HPI:  Ms. Lassley is an 78 yo with hx of PAF, HTN, COPD ( ongoing  cigarette smoking) admitted with a COPD exacerbation and rapid  atrial fibrillation. Diagnosed with probably bilateral PNA.  Patient reports that she has had PNa frequently since esophagus  was dilated a few years ago.       Assessment / Plan / Recommendation Clinical impression: Patient presents with a functional  oropharyngeal swallow which is largely [facility] with the exception of  intermittent trace pharyngeal residuals noted post swallow (? due  to generalized weakness) which clear with spontaneous dry  swallows. No aspiration or penetration observed. Recommend  continuation of a regular diet, thin liquids with general safe  swallowing and reflux precautions given h/o GERD, esophageal  stenosis. Per RN, son concerned about patient's general  decline  for solid foods prior to admission. Although no significant  backflow of bolus into the pharynx noted during today's exam, f/u  esophageal w/u may be beneficial if recurrent stricture or  additional esophageal dysfunction remains a concern. SLP will f/u  briefly at bedside for diet tolerance and education.      Treatment Recommendation  Therapy as outlined in treatment plan below     Diet Recommendation Regular;Thin liquid   Liquid Administration via: Cup;Straw  Medication Administration: Whole meds with liquid Supervision: Patient able to self feed;Intermittent supervision  to cue for compensatory strategies Compensations: Slow  rate;Small sips/bites Postural Changes and/or Swallow Maneuvers: Seated upright 90  degrees;Upright 30-60 min after meal    Oral Care Recommendations: Oral care BID     04/22/12 - Echo Study Conclusions: - Left ventricle: The cavity size was normal. Systolic function was normal. The estimated ejection fraction was in the range of 55% to 60%. Wall motion was normal; there were no regional wall motion abnormalities.  - Mitral valve: Mild regurgitation. - Left atrium: The atrium was moderately dilated. - Right atrium: The atrium was mildly dilated. - Atrial septum: No defect or patent foramen ovale was identified. - Pulmonary arteries: PA peak pressure: 38mm Hg (S). - Pericardium, extracardiac: A trivial pericardial effusion was identified posterior to the heart.   History of Present Illness: 78 y.o. female w/ the above medical problems who presented to [facility] on 04/21/12 with complaints of productive cough and dyspnea.  Hospital Course: In the ED she was volume overloaded on exam. EKG revealed a.fib w/ rvr. CXR showed patchy airspace opacities especially confluent at the left lung base but also in the left mid lung and right lung base, potentially from asymmetric edema or bilateral pneumonia. Labs were significant for normal troponin, pBNP 6141, WBC 6.5, BUN/Crt 49/1.2, glucose 278. It was felt her respiratory failure was multifactorial in the setting of A.Fib w/ RVR, COPD, PNA, and CHF. She was placed on IV cardizem, IV heparin, IV lasix, and admitted for further evaluation and treatment. Cardiac enzymes were cycled and remained negative. She was gently diuresed with improvement in volume status. She developed a contraction metabolic alkalosis for which she was placed on diamox briefly with improvement. IV lasix was transitioned to oral dosing. Weight 150-->143lbs. ACEI was held due to elevated BUN/Crt and not restarted at dc due to soft BP. This will need to be readdressed as an  outpatient. Electrolytes were supplemented as needed. Echo showed EF 55-60, no WMAs, mod LAE, mild RAE, mild MR, PASP . IV cardizem was switched to oral dosing and digoxin added. She remained rate controlled in a. Fib. She was not felt to be a good candidate for long term anticoagulation due to h/o AVM/GIB, dementia, and fall risk.   She was evaluated by the medicine service for management of suspected HCAP and started on antibiotics. Her respiratory status worsened requiring supplemental O2 with face mask. PCCM evaluated her and expanded antibiotic coverage and recommended BiPAP as needed. She completed an 8day course of antibiotics and remained afebrile without leukocytosis. It was noted that her O2 sats dropped more at night than during the day and it was suspected she has OSA for which it was recommended she wear BiPAP while sleeping and have an outpatient sleep study. She will follow up with PCCM as an outpatient. Patient continued to require supplemental oxygen due to desaturation with activity (dropped to 86% on room air) and was therefore discharged on home  O2, 2L continuously.   Her blood sugars were initially elevated, but with the addition of insulin and oral hypoglycemics her blood sugar dropped requiring supplemental dextrose. She developed sporadic deterioration in mental status without focal neuro deficits or signs of re-emerging infection. It was felt likely related to hypoglycemia and/or hypercarbia. This resolved with improvement in blood sugars and respiratory status. She was counseled on smoking cessation and prescribed nicotine patches to assist her in quitting. Given her history of esophageal strictures requiring dilatation and complaints of dysphagia she was evaluated by speech therapy. A swallow study was performed with findings and recommendations as above. She was evaluated by PT/OT and social work who, in conjunction with the patient and her medical team, felt it was appropriate  to be discharged to a skilled nursing facility.   She was seen and evaluated by Dr. Antoine Poche who felt she was stable for discharge to [facility] SNF with plans for follow up as scheduled below.  Discharge Vitals: Blood pressure 102/59, pulse 85, temperature 98.7 F (37.1 C), temperature source Oral, resp. rate 22, height 5\' 6"  (1.676 m), weight 143 lb 9.6 oz (65.137 kg), SpO2 92.00%.  Labs: Component Value Date   WBC 8.4 05/05/2012   HGB 11.8* 05/05/2012   HCT 35.9* 05/05/2012   MCV 91.1 05/05/2012   PLT 347 05/05/2012    Lab 05/05/12 0540  NA 141  K 4.1  CL 102  CO2 31  BUN 29*  CREATININE 0.80  CALCIUM 9.3  GLUCOSE 134*   Lab Results  Component Value Date   CHOL 129 04/22/2012   HDL 51 04/22/2012   LDLCALC 54 04/22/2012   TRIG 121 04/22/2012     04/21/2012 18:19 04/22/2012 00:19 04/22/2012 04:40  Troponin I <0.30 <0.30 <0.30     04/21/2012 13:13 04/27/2012 06:15  Pro B Natriuretic peptide (BNP) 6141.0 (H) 1311.0 (H)     04/22/2012 16:21  Hemoglobin A1C 7.6 (H)     04/23/2012 12:09  Legionella Antigen, Urine Negative for Legionella pneumophilia serogroup 1  Strep Pneumo Urinary Antigen NEGATIVE     Discharge Medications     Medication List     As of 05/05/2012 10:38 AM    STOP taking these medications         amLODipine 10 MG tablet   Commonly known as: NORVASC      calcium carbonate 600 MG Tabs   Commonly known as: OS-CAL      glyBURIDE 5 MG tablet   Commonly known as: DIABETA      lisinopril 5 MG tablet   Commonly known as: PRINIVIL,ZESTRIL      TAKE these medications         aspirin 81 MG tablet   Take 81 mg by mouth daily.      Budesonide 90 MCG/ACT inhaler   Inhale 2 puffs into the lungs 2 (two) times daily.      digoxin 0.125 MG tablet   Commonly known as: LANOXIN   Take 1 tablet (0.125 mg total) by mouth daily.      diltiazem 240 MG 24 hr capsule   Commonly known as: CARDIZEM CD   Take 1 capsule (240 mg total) by mouth  daily.      fluticasone 50 MCG/ACT nasal spray   Commonly known as: FLONASE   2 sprays by Nasal route daily.      furosemide 40 MG tablet   Commonly known as: LASIX   Take 1 tablet (40 mg total) by  mouth daily.      ipratropium 17 MCG/ACT inhaler   Commonly known as: ATROVENT HFA   Inhale 2 puffs into the lungs 4 (four) times daily.      levalbuterol 45 MCG/ACT inhaler   Commonly known as: XOPENEX HFA   Inhale 2 puffs into the lungs every 4 (four) hours as needed for wheezing or shortness of breath.      metFORMIN 500 MG tablet   Commonly known as: GLUCOPHAGE   Take 1 tablet (500 mg total) by mouth 2 (two) times daily before a meal.      multivitamin tablet   Take 1 tablet by mouth daily.      nicotine 21 mg/24hr patch   Commonly known as: NICODERM CQ - dosed in mg/24 hours   21mg  patch one daily for 6 weeks (dispense #42), then 14mg  patch one daily for 2 weeks (dispense #14), then 7mg  patch one daily for 2 weeks (dispense #14). Remove old patch before applying new one.      ONE TOUCH ULTRA TEST test strip   Generic drug: glucose blood   1 each by Other route as needed. Use as instructed  Check blood sugar daily and as needed      potassium chloride SA 20 MEQ tablet   Commonly known as: K-DUR,KLOR-CON   Take 1 tablet (20 mEq total) by mouth daily.      simvastatin 20 MG tablet   Commonly known as: ZOCOR   Take 1 tablet (20 mg total) by mouth daily.      Vitamin D3 2000 UNITS capsule   Take 2,000 Units by mouth daily.          Disposition   Discharge Orders    Future Appointments: Provider: Department: Dept Phone: Center:   05/14/2012 2:45 PM Kalman Shan, MD [facility] [phone] None   05/19/2012 10:00 AM Rosalio Macadamia, NP [facility] Main Office [place]) [phone] [facility]     Future Orders Please Complete By Expires   For home use only DME Bipap      Questions: Responses:   Keep 02 saturation 88-92%   Bleed in oxygen  (LPM) 2   Inspiratory pressure 10   Expiratory pressure 5   Diet - low sodium heart healthy      Increase activity slowly      Discharge instructions      Comments:   * Please follow up with your primary care provider regarding your diabetes and low blood counts.  * Please follow up with pulmonology regarding your COPD and need for outpatient sleep study and pulmonary function testing.     Follow-up Information    Follow up with Norma Fredrickson, NP. On 05/19/2012. (10:00)    Contact information:   [facility] [address] [place] [address] [phone]       Schedule an appointment as soon as possible for a visit with Roxy Manns, MD.   Contact information:   [address] [place]  [place], [place] [address]  [phone]       Follow up with [facility], MD. On 05/14/2012. (2:30)    Contact information:    Pulmonology [address] [place] [address] [phone]           Outstanding Labs/Studies:  1. CBC 05/08/12 2. PFTs 3. Sleep study  Duration of Discharge Encounter: Greater than 30 minutes including physician and PA time.  Signed, HOPE, JESSICA PA-C 05/05/2012, 10:38 AM  The  patient was seen today on rounds.  The plan is as outlined in that note and as noted above.  Rollene Rotunda. 05/05/12

## 2012-05-05 NOTE — Progress Notes (Signed)
   SUBJECTIVE:  She denies any chest pain or SOB.  She walked yesterday and did well with that.   PHYSICAL EXAM Filed Vitals:   05/04/12 1600 05/04/12 2029 05/04/12 2105 05/05/12 0544  BP:   102/58 102/59  Pulse:   70 85  Temp: 92 F (33.3 C)  98.1 F (36.7 C) 98.7 F (37.1 C)  TempSrc:   Oral Oral  Resp:      Height:      Weight:    143 lb 9.6 oz (65.137 kg)  SpO2: 91% 91% 90% 92%   General:  No distress Lungs:  Bilateral basilar crackles Heart:  RRR Abdomen:  Positive bowel sounds, no rebound no guarding Extremities:  No edema.  LABS: Lab Results  Component Value Date   CKTOTAL 75 12/08/2008   CKMB 1.2 12/08/2008   TROPONINI <0.30 04/22/2012   Results for orders placed during the hospital encounter of 04/21/12 (from the past 24 hour(s))  GLUCOSE, CAPILLARY     Status: Abnormal   Collection Time   05/04/12 11:17 AM      Component Value Range   Glucose-Capillary 121 (*) 70 - 99 mg/dL   Comment 1 Notify RN    GLUCOSE, CAPILLARY     Status: Abnormal   Collection Time   05/04/12  4:13 PM      Component Value Range   Glucose-Capillary 113 (*) 70 - 99 mg/dL   Comment 1 Notify RN    GLUCOSE, CAPILLARY     Status: Normal   Collection Time   05/04/12  8:51 PM      Component Value Range   Glucose-Capillary 87  70 - 99 mg/dL   Comment 1 Notify RN     Comment 2 Documented in Chart      Intake/Output Summary (Last 24 hours) at 05/05/12 0622 Last data filed at 05/05/12 0455  Gross per 24 hour  Intake 1190.4 ml  Output   1150 ml  Net   40.4 ml     ASSESSMENT AND PLAN:   1. Acute on Chronic Respiratory Failure: PCCM rec BiPAP when sleeping with home BiPAP & O2 (O2 at 2L 24/7), as well as a sleep study and PFTs as outpatient. Will need f/u w/ PCCM 2wks post discharge. Now on PO diuretic and completed a course of antibiotics.  Of note the patient has not been wearing the BiPAP.  We can clarify with PCCM if they want to try to send her home with this.   2. Pneumonia: Abx  stopped 11/1. Afebrile without leukocytosis. No further treatment.  3. Acute on Chronic Diastolic CHF: EF 16-10% by echo 10/23.  Euvolemic.  Continue current therapy.  4. Fluctuating Mental Status: Back to baseline.  No further work up.   5. Atrial Fibrillation w/ RVR: H/o PAF. Coumadin dc'd 2006 due to h/o AVM/GIB. Rate controlled on digoxin, diltiazem. No long term anticoagulation secondary to multiple reasons outlined on admission  6. Diabetes Mellitus with hypoglycemia:  Cont metformin   7. COPD w/ ongoing Tobacco abuse: Home O2, continue current pulmonary medications.  The patient can use a nicotine patch to help with smoking cessation after discharge.     Fayrene Fearing [facility] 05/05/2012 6:22 AM

## 2012-05-05 NOTE — Progress Notes (Signed)
DC IV, DC Tele, DC to SNF. Discharge instructions and home medications discussed with patient. Patient denied any questions or concerns at this time. Patient leaving unit with O2, via EMS and appears in no acute distress.

## 2012-05-05 NOTE — Progress Notes (Signed)
SATURATION QUALIFICATIONS:  Patient Saturations on Room Air at Rest = 91%  Patient Saturations on Room Air while Ambulating = 86%  Patient Saturations on 3 Liters of oxygen while Ambulating = 91-93%  Statement of medical necessity for home oxygen: Patient DeSats on RA while ambulating without O2

## 2012-05-05 NOTE — Progress Notes (Signed)
Co-signed for Shlondria Frink RN/BSN for Assessments, IV assessments, I&O's, Vital Signs, Medication administration, Progress notes, Care plans, and patient education. Jeffey Janssen M, RN/ BSN  

## 2012-05-14 ENCOUNTER — Encounter: Payer: Self-pay | Admitting: Internal Medicine

## 2012-05-14 ENCOUNTER — Ambulatory Visit (INDEPENDENT_AMBULATORY_CARE_PROVIDER_SITE_OTHER): Payer: Medicare Other | Admitting: Internal Medicine

## 2012-05-14 VITALS — BP 120/72 | HR 83 | Temp 97.7°F | Ht 67.0 in | Wt 144.0 lb

## 2012-05-14 DIAGNOSIS — J9620 Acute and chronic respiratory failure, unspecified whether with hypoxia or hypercapnia: Secondary | ICD-10-CM

## 2012-05-14 DIAGNOSIS — J449 Chronic obstructive pulmonary disease, unspecified: Secondary | ICD-10-CM

## 2012-05-14 MED ORDER — IPRATROPIUM BROMIDE 0.02 % IN SOLN: 500.0000 ug | Freq: Four times a day (QID) | RESPIRATORY_TRACT | Status: DC

## 2012-05-14 MED ORDER — LEVALBUTEROL HCL 0.63 MG/3ML IN NEBU: 1.0000 | INHALATION_SOLUTION | Freq: Four times a day (QID) | RESPIRATORY_TRACT | Status: DC

## 2012-05-14 NOTE — Patient Instructions (Addendum)
Glad you are doing better from your copd and respiratory failure Continue oxygen Continue cpap at night Continue pulmicort inhaler twice daily However, tell [facility] to change you to   - xopenex nebulizer 4 times daily and atrovent nebulizer 4 times daily scheduled  - also use xopenex inhaler as needed for emergency I have referred you to pulmonary rehabilitation Return in 4-6 weeks to see Rubye Oaks our NP for review of breathing test and med calendar visit Call or return sooner if there are problems

## 2012-05-14 NOTE — Progress Notes (Signed)
Subjective:    Patient ID: [name], female    DOB: Apr 17, 1930, 78 y.o.   MRN: [id number]  HPI  PCP is Roxy Manns, MD   Body mass index is 22.55 kg/(m^2).  reports that she quit smoking about 3 weeks ago. Her smoking use included Cigarettes. She has a 25 pack-year smoking history. She has never used smokeless tobacco.   #Acute Respiratory Failure #Chronic Respiratory Failure / COPD - June 2010 - intubated with acinetobacter (resistant) lower lobe pneumonia following ankle fracture  - Admited 04/21/12 - 05/05/12 - Rx Bipap. Discharged on nocturnal NIPPV  #Tobacco Abuse  - quit Oct 2013 post admission  #. Acute on Chronic Diastolic CHF  - EF 55-60% by echo 04/22/12  #Cachexia - Weight 150-->143lbs  - Body mass index is 22.55 kg/(m^2).  # Atrial Fibrillation w/ RVR  - H/o PAF. Not anticoagulation candidate due to h/o AVM/GIB, dementia, and fall risk  - Conservative therapy with rate control   #Other issues   -  Diabetes Mellitus Type 2, Anemia of chronic disease    OV 05/14/12  - Followup followin recent admission above HCAP/AECOPD/Acute on chronic respiratory failure. She is her with son and daughter in law. She is in SNF. She is getting stronger and doing some rehab. She looks better. No resp complaints. She has quit smoking an swears she will never smoke again. Meds reviewed and noticed she is on mdi. SHe looks too frail to do mdi. Son pleased with her progres  Past Medical History  Diagnosis Date  . Allergy     allergic Rhintis  . Hyperlipidemia   . Hypertension   . Osteoporosis   . PAF (paroxysmal atrial fibrillation)     a. PAF dates back > 5 yrs, prev on coumadin->d/c 2006 2/2 GIB;   . Carotid stenosis     a. 09/2007: Carotid Doppler stable- stable bilateral stenosis 40-59% (from 06)  . Type II diabetes mellitus   . Tobacco abuse     a. ongoing - 50+ pack yr hx.  . Dementia     Dementia in Hospital //Does O.K. at home.  . Diverticulosis   . GI bleeding      a. when on coumadin in 2006 - colonoscopy @ that time showed 2 large right colon avm's, multiple small bowel avm's, fresh blood in prox small bowel.  . AVM (arteriovenous malformation) of colon     a. 2006 colonscopy  . Chronic respiratory failure   . COPD (chronic obstructive pulmonary disease)   . Chronic diastolic CHF (congestive heart failure)   . Normocytic anemia 03/2012     Family History  Problem Relation Age of Onset  . Stroke Mother     a. believes she died suddenly @ 32.  . Diabetes Mother   . Stroke Father     Died from cerebral hemorrhage @ 14  . Diabetes Father   . Cancer Sister     colon and stomach cancer  . Diabetes Brother   . Heart disease Brother   . Cancer Brother     died from lung cancer  . Heart disease Brother     MI  . Cancer Sister     ? liver//brain cancer  . Heart disease Sister   . Diabetes Sister     diabetes and glaucoma  . Cancer Brother     pancreatic cancer  . Diabetes Son      History   Social History  . Marital Status: Widowed  Spouse Name: N/A    Number of Children: N/A  . Years of Education: N/A   Occupational History  . Not on file.   Social History Main Topics  . Smoking status: Former Smoker -- 0.5 packs/day for 50 years    Types: Cigarettes    Quit date: 04/21/2012  . Smokeless tobacco: Never Used  . Alcohol Use: No  . Drug Use: No  . Sexually Active: Not on file   Other Topics Concern  . Not on file   Social History Narrative   Lives in [place] by herself.     Allergies  Allergen Reactions  . Azithromycin     REACTION: inc blood sugar  . Codeine Itching  . Coumadin (Warfarin) Other (See Comments)    Causes bleeding  . Oxybutynin Chloride Er     Constipation/ dizziness/ dry mouth     Outpatient Prescriptions Prior to Visit  Medication Sig Dispense Refill  . aspirin 81 MG tablet Take 81 mg by mouth daily.        . Budesonide (PULMICORT FLEXHALER) 90 MCG/ACT inhaler Inhale 2 puffs into the  lungs 2 (two) times daily.  1 Inhaler  0  . Cholecalciferol (VITAMIN D3) 2000 UNITS capsule Take 2,000 Units by mouth daily.        . digoxin (LANOXIN) 0.125 MG tablet Take 1 tablet (0.125 mg total) by mouth daily.  30 tablet  6  . diltiazem (CARDIZEM CD) 240 MG 24 hr capsule Take 1 capsule (240 mg total) by mouth daily.  30 capsule  6  . furosemide (LASIX) 40 MG tablet Take 1 tablet (40 mg total) by mouth daily.  30 tablet  6  . glucose blood (ONE TOUCH ULTRA TEST) test strip 1 each by Other route as needed. Use as instructed Check blood sugar daily and as needed       . ipratropium (ATROVENT HFA) 17 MCG/ACT inhaler Inhale 2 puffs into the lungs 4 (four) times daily.  1 Inhaler  0  . levalbuterol (XOPENEX HFA) 45 MCG/ACT inhaler Inhale 2 puffs into the lungs every 4 (four) hours as needed for wheezing or shortness of breath.  1 Inhaler  0  . metFORMIN (GLUCOPHAGE) 500 MG tablet Take 1 tablet (500 mg total) by mouth 2 (two) times daily before a meal.  60 tablet  0  . Multiple Vitamin (MULTIVITAMIN) tablet Take 1 tablet by mouth daily.        . nicotine (NICODERM CQ - DOSED IN MG/24 HOURS) 21 mg/24hr patch 21mg  patch one daily for 6 weeks (dispense #42), then 14mg  patch one daily for 2 weeks (dispense #14), then 7mg  patch one daily for 2 weeks (dispense #14). Remove old patch before applying new one.      . potassium chloride SA (K-DUR,KLOR-CON) 20 MEQ tablet Take 1 tablet (20 mEq total) by mouth daily.  30 tablet  6  . [DISCONTINUED] fluticasone (FLONASE) 50 MCG/ACT nasal spray 2 sprays by Nasal route daily.  16 g  11  . simvastatin (ZOCOR) 20 MG tablet Take 1 tablet (20 mg total) by mouth daily.  30 tablet  11   Last reviewed on 05/14/2012  2:57 PM by Darrell Jewel, CMA      Review of Systems  Constitutional: Positive for appetite change. Negative for fever and unexpected weight change.  HENT: Positive for trouble swallowing. Negative for ear pain, nosebleeds, congestion, sore throat,  rhinorrhea, sneezing, dental problem, postnasal drip and sinus pressure.   Eyes: Negative  for redness and itching.  Respiratory: Positive for shortness of breath. Negative for cough, chest tightness and wheezing.   Cardiovascular: Negative for palpitations and leg swelling.  Gastrointestinal: Negative for nausea and vomiting.  Genitourinary: Negative for dysuria.  Musculoskeletal: Negative for joint swelling.  Skin: Negative for rash.  Neurological: Negative for headaches.  Hematological: Does not bruise/bleed easily.  Psychiatric/Behavioral: Negative for dysphoric mood. The patient is not nervous/anxious.        Objective:   Physical Exam  Vitals reviewed. Constitutional: She is oriented to person, place, and time. She appears well-developed and well-nourished. No distress.       Frail Sitting in wheel chair  HENT:  Head: Normocephalic and atraumatic.  Right Ear: External ear normal.  Left Ear: External ear normal.  Mouth/Throat: Oropharynx is clear and moist. No oropharyngeal exudate.  Eyes: Conjunctivae normal and EOM are normal. Pupils are equal, round, and reactive to light. Right eye exhibits no discharge. Left eye exhibits no discharge. No scleral icterus.  Neck: Normal range of motion. Neck supple. No JVD present. No tracheal deviation present. No thyromegaly present.  Cardiovascular: Normal rate, regular rhythm, normal heart sounds and intact distal pulses.  Exam reveals no gallop and no friction rub.   No murmur heard. Pulmonary/Chest: Effort normal and breath sounds normal. No respiratory distress. She has no wheezes. She has no rales. She exhibits no tenderness.       barrell chest  Abdominal: Soft. Bowel sounds are normal. She exhibits no distension and no mass. There is no tenderness. There is no rebound and no guarding.  Musculoskeletal: Normal range of motion. She exhibits no edema and no tenderness.       OA and frail  Lymphadenopathy:    She has no cervical  adenopathy.  Neurological: She is alert and oriented to person, place, and time. She has normal reflexes. No cranial nerve deficit. She exhibits normal muscle tone. Coordination normal.  Skin: Skin is warm and dry. No rash noted. She is not diaphoretic. No erythema. No pallor.  Psychiatric: She has a normal mood and affect. Her behavior is normal. Judgment and thought content normal.          Assessment & Plan:

## 2012-05-17 ENCOUNTER — Encounter: Payer: Self-pay | Admitting: Internal Medicine

## 2012-05-17 NOTE — Assessment & Plan Note (Signed)
Glad you are doing better from your copd and respiratory failure Continue oxygen Continue cpap at night Continue pulmicort inhaler twice daily However, tell [facility] to change you to   - xopenex nebulizer 4 times daily and atrovent nebulizer 4 times daily scheduled  - also use xopenex inhaler as needed for emergency I have referred you to pulmonary rehabilitation Return in 4-6 weeks to see Tammy Parrett our NP for review of breathing test and med calendar visit Call or return sooner if there are problems 

## 2012-05-19 ENCOUNTER — Ambulatory Visit (INDEPENDENT_AMBULATORY_CARE_PROVIDER_SITE_OTHER): Payer: Medicare Other | Admitting: Nurse Practitioner

## 2012-05-19 ENCOUNTER — Encounter: Payer: Self-pay | Admitting: Internal Medicine

## 2012-05-19 ENCOUNTER — Encounter: Payer: Self-pay | Admitting: Nurse Practitioner

## 2012-05-19 VITALS — BP 112/64 | HR 62 | Ht 66.0 in | Wt 141.0 lb

## 2012-05-19 DIAGNOSIS — J9620 Acute and chronic respiratory failure, unspecified whether with hypoxia or hypercapnia: Secondary | ICD-10-CM

## 2012-05-19 DIAGNOSIS — J690 Pneumonitis due to inhalation of food and vomit: Secondary | ICD-10-CM

## 2012-05-19 LAB — CBC WITH DIFFERENTIAL/PLATELET
Basophils Absolute: 0 10*3/uL (ref 0.0–0.1)
Basophils Relative: 0.1 % (ref 0.0–3.0)
Eosinophils Absolute: 0.1 10*3/uL (ref 0.0–0.7)
Eosinophils Relative: 1.4 % (ref 0.0–5.0)
HCT: 41.6 % (ref 36.0–46.0)
Hemoglobin: 13.6 g/dL (ref 12.0–15.0)
Lymphocytes Relative: 21.8 % (ref 12.0–46.0)
Lymphs Abs: 1.9 10*3/uL (ref 0.7–4.0)
MCHC: 32.8 g/dL (ref 30.0–36.0)
MCV: 91.9 fl (ref 78.0–100.0)
Monocytes Absolute: 0.4 10*3/uL (ref 0.1–1.0)
Monocytes Relative: 4.9 % (ref 3.0–12.0)
Neutro Abs: 6.2 10*3/uL (ref 1.4–7.7)
Neutrophils Relative %: 71.8 % (ref 43.0–77.0)
Platelets: 185 10*3/uL (ref 150.0–400.0)
RBC: 4.53 Mil/uL (ref 3.87–5.11)
RDW: 13.9 % (ref 11.5–14.6)
WBC: 8.7 10*3/uL (ref 4.5–10.5)

## 2012-05-19 LAB — BASIC METABOLIC PANEL
BUN: 32 mg/dL — ABNORMAL HIGH (ref 6–23)
CO2: 30 mEq/L (ref 19–32)
Calcium: 9.7 mg/dL (ref 8.4–10.5)
Chloride: 97 mEq/L (ref 96–112)
Creatinine, Ser: 1.1 mg/dL (ref 0.4–1.2)
GFR: 51.83 mL/min — ABNORMAL LOW (ref >60.00)
Glucose, Bld: 196 mg/dL — ABNORMAL HIGH (ref 70–99)
Potassium: 4.7 mEq/L (ref 3.5–5.1)
Sodium: 137 mEq/L (ref 135–145)

## 2012-05-19 MED ORDER — SIMVASTATIN 20 MG PO TABS: 20.0000 mg | ORAL_TABLET | Freq: Every day | ORAL | Status: DC

## 2012-05-19 NOTE — Patient Instructions (Addendum)
Congratulations for not smoking  Follow this list of medicines.  I am adding back Zocor 20 mg at night for your cholesterol. I think you have this medicine at home. You can start this when you get back hom.   I would like for you to see Dr. Leone Payor about your esophagus and possible dilatation  I would like a CXR today  See Dr. Antoine Poche in a 4 weeks  Call the [facility] office at [phone] if you have any questions, problems or concerns.

## 2012-05-19 NOTE — Progress Notes (Signed)
[name] Date of Birth: [birth year] Medical Record #[id number]  History of Present Illness: [name] is seen back today for a post hospital visit. She is seen for Dr. Antoine Poche. She is an 78 year old female with multiple issues which include PAF, HTN, COPD,and dementia. Other issues are as noted below. Was most recently admitted with a COPD exacerbation and atrial fib with RVR. Noted to have bilateral PNA. Apparently has been having recurrent pneumonia and has known esophageal issues.   With her hospitalization, she was treated with antibiotics, diuresed. Her respiratory failure was felt to be multifactorial from her atrial fib, COPD, PNA and heart failure. Echo showed EF 55 to 60%. She remains in atrial fib. Rate was controlled. Not a candidate for anticoagulation due to frailty and falls. Was sent to the SNF for rehab. She now requires BiPAP and continuous oxygen.   She comes back today. She is here with her son. She says she is doing well. SHE IS NOT SMOKING![name] She has just a couple more days at the facility and will be going to her son's house. She says her breathing is better. Less coughing. No recent falls. She admits that she was "pretty much out of it for the first 2 weeks" but is now improving. Son thinks she is ok. He is worried about her swallowing. Wants her to go back and see GI. She did have a swallowing study which showed no significant backflow of bolus in to the pharynx but with recommended follow up if symptoms persist. Son is also worried about her not being on her statin. She was on low dose Zocor prior. Her Norvasc was changed to diltiazem to aid her rate control. No chest pain. No recent falls.   Current Outpatient Prescriptions on File Prior to Visit  Medication Sig Dispense Refill  . aspirin 81 MG tablet Take 81 mg by mouth daily.        . Budesonide (PULMICORT FLEXHALER) 90 MCG/ACT inhaler Inhale 2 puffs into the lungs 2 (two) times daily.  1 Inhaler  0  . Cholecalciferol  (VITAMIN D3) 2000 UNITS capsule Take 2,000 Units by mouth daily.        . digoxin (LANOXIN) 0.125 MG tablet Take 1 tablet (0.125 mg total) by mouth daily.  30 tablet  6  . diltiazem (CARDIZEM CD) 240 MG 24 hr capsule Take 1 capsule (240 mg total) by mouth daily.  30 capsule  6  . fluticasone (FLONASE) 50 MCG/ACT nasal spray Place 2 sprays into the nose daily.      . furosemide (LASIX) 40 MG tablet Take 1 tablet (40 mg total) by mouth daily.  30 tablet  6  . glucose blood (ONE TOUCH ULTRA TEST) test strip 1 each by Other route as needed. Use as instructed Check blood sugar daily and as needed       . insulin aspart (NOVOLOG) 100 UNIT/ML injection Inject 5 Units into the skin daily.      [name] ipratropium (ATROVENT HFA) 17 MCG/ACT inhaler Inhale 2 puffs into the lungs 4 (four) times daily.  1 Inhaler  0  . ipratropium (ATROVENT) 0.02 % nebulizer solution Take 2.5 mLs (500 mcg total) by nebulization 4 (four) times daily.  300 mL  12  . levalbuterol (XOPENEX HFA) 45 MCG/ACT inhaler Inhale 2 puffs into the lungs every 4 (four) hours as needed for wheezing or shortness of breath.  1 Inhaler  0  . metFORMIN (GLUCOPHAGE) 500 MG tablet Take 1 tablet (  500 mg total) by mouth 2 (two) times daily before a meal.  60 tablet  0  . Multiple Vitamin (MULTIVITAMIN) tablet Take 1 tablet by mouth daily.        . nicotine (NICODERM CQ - DOSED IN MG/24 HOURS) 21 mg/24hr patch 21mg  patch one daily for 6 weeks (dispense #42), then 14mg  patch one daily for 2 weeks (dispense #14), then 7mg  patch one daily for 2 weeks (dispense #14). Remove old patch before applying new one.      . potassium chloride SA (K-DUR,KLOR-CON) 20 MEQ tablet Take 1 tablet (20 mEq total) by mouth daily.  30 tablet  6  . levalbuterol (XOPENEX) 0.63 MG/3ML nebulizer solution Take 3 mLs (0.63 mg total) by nebulization 4 (four) times daily.  360 mL  12    Allergies  Allergen Reactions  . Azithromycin     REACTION: inc blood sugar  . Codeine Itching  .  Coumadin (Warfarin) Other (See Comments)    Causes bleeding  . Oxybutynin Chloride Er     Constipation/ dizziness/ dry mouth    Past Medical History  Diagnosis Date  . Allergy     allergic Rhintis  . Hyperlipidemia   . Hypertension   . Osteoporosis   . PAF (paroxysmal atrial fibrillation)     a. PAF dates back > 5 yrs, prev on coumadin->d/c 2006 2/2 GIB;   . Carotid stenosis     a. 09/2007: Carotid Doppler stable- stable bilateral stenosis 40-59% (from 06)  . Type II diabetes mellitus   . Tobacco abuse     a. ongoing - 50+ pack yr hx.  . Dementia     Dementia in Hospital //Does O.K. at home.  . Diverticulosis   . GI bleeding     a. when on coumadin in 2006 - colonoscopy @ that time showed 2 large right colon avm's, multiple small bowel avm's, fresh blood in prox small bowel.  . AVM (arteriovenous malformation) of colon     a. 2006 colonscopy  . Chronic respiratory failure   . COPD (chronic obstructive pulmonary disease)   . Chronic diastolic CHF (congestive heart failure)   . Normocytic anemia 03/2012    Past Surgical History  Procedure Date  . Cardiac catheterization 1990's    clear  . Orif tibia & fibula fractures 05/2004  . Small bowel enteroscopy 03/2005    AVMS    History  Smoking status  . Former Smoker -- 0.5 packs/day for 50 years  . Types: Cigarettes  . Quit date: 04/21/2012  Smokeless tobacco  . Never Used    History  Alcohol Use No    Family History  Problem Relation Age of Onset  . Stroke Mother     a. believes she died suddenly @ 32.  . Diabetes Mother   . Stroke Father     Died from cerebral hemorrhage @ 76  . Diabetes Father   . Cancer Sister     colon and stomach cancer  . Diabetes Brother   . Heart disease Brother   . Cancer Brother     died from lung cancer  . Heart disease Brother     MI  . Cancer Sister     ? liver//brain cancer  . Heart disease Sister   . Diabetes Sister     diabetes and glaucoma  . Cancer Brother      pancreatic cancer  . Diabetes Son     Review of Systems: The review of systems  is per the HPI.  All other systems were reviewed and are negative.  Physical Exam: BP 112/64  Pulse 62  Ht 5\' 6"  (1.676 m)  Wt 141 lb (63.957 kg)  BMI 22.76 kg/m2 Patient is very pleasant and in no acute distress. She looks chronically ill. She has oxygen in place. Skin is warm and dry. Color is normal.  HEENT is unremarkable. Normocephalic/atraumatic. PERRL. Sclera are nonicteric. Neck is supple. No masses. No JVD. Lungs are rhonchi in the bases. Cardiac exam shows an irregular rhythm. Her rate is controlled. Abdomen is soft. Extremities are without edema. Gait and ROM are intact. No gross neurologic deficits noted.  LABORATORY DATA: PENDING  Lab Results  Component Value Date   WBC 8.7 05/19/2012   HGB 13.6 05/19/2012   HCT 41.6 05/19/2012   PLT 185.0 05/19/2012   GLUCOSE 196* 05/19/2012   CHOL 129 04/22/2012   TRIG 121 04/22/2012   HDL 51 04/22/2012   LDLCALC 54 04/22/2012   ALT 12 07/11/2010   AST 18 07/11/2010   NA 137 05/19/2012   K 4.7 05/19/2012   CL 97 05/19/2012   CREATININE 1.1 05/19/2012   BUN 32* 05/19/2012   CO2 30 05/19/2012   TSH 1.11 07/11/2010   INR 1.1 12/08/2008   HGBA1C 7.6* 04/22/2012   MICROALBUR 1.8 11/04/2007   Dg Chest Port 1 View  04/30/2012  *RADIOLOGY REPORT*  Clinical Data: Respiratory failure, history of pneumonia  PORTABLE CHEST - 1 VIEW  Comparison: 04/25/2012  Findings: Cardiomegaly again noted.  Persistent small left pleural effusion with left basilar atelectasis or infiltrate.  No pulmonary edema.  Patchy airspace disease in the right lower lobe probable residual pneumonia.  Follow-up to resolution is recommended.  IMPRESSION:  Persistent small left pleural effusion with left basilar atelectasis or infiltrate.  No pulmonary edema.  Patchy airspace disease in the right lower lobe probable residual pneumonia. Follow-up to resolution is recommended.   Original Report  Authenticated By: Natasha Mead, M.D.    Dg Swallowing Func-speech Pathology  04/22/2012  Vivi Ferns McCoy, CCC-SLP     04/22/2012  2:51 PM Objective Swallowing Evaluation: Modified Barium Swallowing Study   Patient Details  Name: DARIANNA AMY MRN: [id number] Date of Birth: May 12, 1930  Today's Date: 04/22/2012 Time: 1420-1440 SLP Time Calculation (min): 20 min  Past Medical History:  Past Medical History  Diagnosis Date  . Allergy     allergic Rhintis  . Hyperlipidemia   . Hypertension   . Osteoporosis   . PAF (paroxysmal atrial fibrillation)     a. dates back > 5 yrs, prev on coumadin->d/c 2/2 GIB;  b.  12/2011 Echo:  EF 60-65%, nl wall motion Triv MR/TR.  [name] Carotid stenosis     a. 09/2007: Carotid Doppler stable- stable bilateral stenosis  40-59% (from 06)  . Type II diabetes mellitus   . Tobacco abuse     a. ongoing - 50+ pack yr hx.  . Dementia     Dementia in Hospital //Does O.K. at home.  . Diverticulosis   . GI bleeding     a. when on coumadin in 2006 - colonoscopy @ that time showed 2  large right colon avm's, multiple small bowel avm's, fresh blood  in prox small bowel.  . AVM (arteriovenous malformation) of colon     a. 2006 colonscopy   Past Surgical History:  Past Surgical History  Procedure Date  . Cardiac catheterization 1990's    clear  . Orif tibia &  fibula fractures 05/2004  . Small bowel enteroscopy 03/2005    AVMS   HPI:  Ms. Offner is an 78 yo with hx of PAF, HTN, COPD ( ongoing  cigarette smoking) admitted with a COPD exacerbation and rapid  atrial fibrillation. Diagnosed with probably bilateral PNA.  Patient reports that she has had PNa frequently since esophagus  was dilated a few years ago.      Assessment / Plan / Recommendation Clinical Impression  Clinical impression: Patient presents with a functional  oropharyngeal swallow which is largely [facility] with the exception of  intermittent trace pharyngeal residuals noted post swallow (? due  to generalized weakness) which clear with spontaneous dry   swallows. No aspiration or penetration observed. Recommend  continuation of a regular diet, thin liquids with general safe  swallowing and reflux precautions given h/o GERD, esophageal  stenosis. Per RN, son concerned about patient's general decline  for solid foods prior to admission. Although no significant  backflow of bolus into the pharynx noted during today's exam, f/u  esophageal w/u may be beneficial if recurrent stricture or  additional esophageal dysfunction remains a concern. SLP will f/u  briefly at bedside for diet tolerance and education.     Treatment Recommendation  Therapy as outlined in treatment plan below    Diet Recommendation Regular;Thin liquid   Liquid Administration via: Cup;Straw Medication Administration: Whole meds with liquid Supervision: Patient able to self feed;Intermittent supervision  to cue for compensatory strategies Compensations: Slow rate;Small sips/bites Postural Changes and/or Swallow Maneuvers: Seated upright 90  degrees;Upright 30-60 min after meal    Other  Recommendations Oral Care Recommendations: Oral care BID   Follow Up Recommendations  None    Frequency and Duration min 2x/week  2 weeks   Pertinent Vitals/Pain n/a    SLP Swallow Goals Patient will utilize recommended strategies during swallow to  increase swallowing safety with: Supervision/safety Swallow Study Goal #2 - Progress: Not met   General HPI: Ms. Quilling is an 78 yo with hx of PAF, HTN, COPD (  ongoing cigarette smoking) admitted with a COPD exacerbation and  rapid atrial fibrillation. Diagnosed with probably bilateral PNA.  Patient reports that she has had PNa frequently since esophagus  was dilated a few years ago.  Type of Study: Modified Barium Swallowing Study Reason for Referral: Objectively evaluate swallowing function Previous Swallow Assessment: MBS 12/13/08-dysphagia 3, nectar  thick liquids. 01/10/09-advanced to regular diet, thin  liquids-flash penetration Diet Prior to this Study: Regular;Thin  liquids Temperature Spikes Noted: No Respiratory Status: Supplemental O2 delivered via (comment) (5 L  nasal cannula) History of Recent Intubation: No Behavior/Cognition: Alert;Cooperative;Pleasant mood Oral Cavity - Dentition: Missing dentition Oral Motor / Sensory Function: Within functional limits Self-Feeding Abilities: Able to feed self Patient Positioning: Upright in chair Baseline Vocal Quality: Hoarse Volitional Cough: Strong Volitional Swallow: Able to elicit Anatomy: Within functional limits Pharyngeal Secretions: Not observed secondary MBS    Reason for Referral Objectively evaluate swallowing function   Oral Phase Oral Preparation/Oral Phase Oral Phase: WFL   Pharyngeal Phase Pharyngeal Phase Pharyngeal Phase: Impaired Pharyngeal - Thin Pharyngeal - Thin Cup: Delayed swallow initiation;Premature  spillage to valleculae;Pharyngeal residue - valleculae;Pharyngeal  residue - pyriform sinuses (trace residuals clear with  spontaneous dry swallow) Pharyngeal - Thin Straw: Delayed swallow initiation;Premature  spillage to valleculae;Pharyngeal residue - valleculae;Pharyngeal  residue - pyriform sinuses (trace residuals clear with  spontaneous dry swallow) Pharyngeal - Solids Pharyngeal - Puree: Delayed swallow initiation;Premature spillage  to valleculae Pharyngeal -  Regular: Delayed swallow initiation;Premature  spillage to valleculae Pharyngeal - Pill: Within functional limits  Cervical Esophageal Phase    GO   Ferdinand Lango MA, CCC-SLP [phone]  Cervical Esophageal Phase Cervical Esophageal Phase: [facility]       McCoy Leah [name] 04/22/2012, 2:50 PM     Assessment / Plan: 1. Acute on chronic respiratory failure - felt to be due to diastolic heart failure, COPD exacerbation, bilateral PNA and tobacco. She is doing better. Still with lots of congestion on exam. Needs follow up CXR to see resolution of her pneumonia. Needs to go ahead and get back in with GI to discuss possible repeat esophageal dilatation.    2. Chronic atrial fib - rate is controlled. Not a candidate for anticoagulation  3. Tobacco abuse - not smoking. Still using a patch. Hopefully she can abstain once she leaves the SNF. She is encouraged to not smoke, especially now with continuous oxygen.   4. HLD - son wants her back on her Zocor. She can restart this when she gets back home.   We will recheck labs today. Arrange for her to see GI. Recheck CXR. She has pulmonary follow up. See Dr. Antoine Poche in 4 weeks. Son has a list of her medicines and will verify her medicines when she leaves the facility to ensure compliance.   Patient is agreeable to this plan and will call if any problems develop in the interim.

## 2012-05-21 ENCOUNTER — Telehealth: Payer: Self-pay | Admitting: Internal Medicine

## 2012-05-21 DIAGNOSIS — J9610 Chronic respiratory failure, unspecified whether with hypoxia or hypercapnia: Secondary | ICD-10-CM

## 2012-05-21 NOTE — Telephone Encounter (Signed)
Called and [email or web address] for lecretia.

## 2012-05-22 ENCOUNTER — Telehealth: Payer: Self-pay

## 2012-05-22 NOTE — Telephone Encounter (Signed)
Order in IN box I do not have the cxr report- will need to have them send it to [place] (I have checked my stack)

## 2012-05-22 NOTE — Telephone Encounter (Signed)
[name], nurse with [name] request referral for home health nursing for medication education for diabetes,PT,OT and home health aide. Pt was admitted to hospital for acute respiratory failure and  pneumonia;discharged to [facility]. [name] said family wants CXR report done 05/21/12 as f/u of pneumonia.(done at [facility]).Please advise.

## 2012-05-22 NOTE — Telephone Encounter (Signed)
Order has been placed for the split night study.  i called and lmom for lecretia to make her aware. Nothing further needed.

## 2012-05-22 NOTE — Telephone Encounter (Signed)
I think Dr Craige Cotta discharged her. Might have been for chronic resp failure and not OSA; not sure. You can order split night protocol sleep study

## 2012-05-22 NOTE — Telephone Encounter (Signed)
Left voicemail for [name] requesting her to call office, will try to call back later

## 2012-05-22 NOTE — Telephone Encounter (Signed)
I spoke with [name] and she states the pt is going to need a sleep study in order to qualify for cpap at home. [name] states qualifying the pt while she was in patient at [facility] is a different process. Dr. Marchelle Gearing, I do not see where the pt has ever had a sleep study. Please advise if ok to order. Thanks. Carron Curie, CMA

## 2012-05-25 NOTE — Telephone Encounter (Signed)
[name] said verbal [place] was all she needed so she didn't need me to fax over orders, but she did want me to let you know that on Sat. The family called her because pt's blood sugar was in the 300s, she went to check on pt and realized that the family has not been giving her the Novolog they have only been giving her the Metformin, [name] advise family to start giving her Novolog again and the family told her they didn't want to because the doctor told them they didn't have to but they did and when they did her blood sugar went back down around 150, [name] has talked to the family about giving her the Novolog since they didn't want too but she just wanted you to know about that situation  Also I called [facility] and requested the CXR and waiting for them to fax it over

## 2012-05-25 NOTE — Telephone Encounter (Signed)
Thanks for the updates -if problems/ symptoms worsen or rales do not clear with cough let me know  It looks like she will have upcoming appt in dec with the pulmonary doctor  I would like her to f/u with me sometime after that if able -thanks

## 2012-05-25 NOTE — Telephone Encounter (Signed)
Xray report received and placed in your Inbox

## 2012-05-25 NOTE — Telephone Encounter (Signed)
Also [name] said she listened to pt's lungs and in the Right Lower Lobe she herd some rails but she thinks some deep breathing and coughing should help that

## 2012-05-25 NOTE — Telephone Encounter (Signed)
Xray is read as negative I signed it to scan and put in IN box in case they want a copy

## 2012-05-26 NOTE — Telephone Encounter (Signed)
Notified pt's son of xray report and scheduled f/u appt for 06/15/12 (after pulmonary appt)

## 2012-05-26 NOTE — Telephone Encounter (Signed)
Left voicemail on pt's home phone requesting her or her family to call office back, will try to call back later

## 2012-06-11 ENCOUNTER — Encounter: Payer: Self-pay | Admitting: Adult Health

## 2012-06-11 ENCOUNTER — Ambulatory Visit (INDEPENDENT_AMBULATORY_CARE_PROVIDER_SITE_OTHER): Payer: Medicare Other | Admitting: Adult Health

## 2012-06-11 ENCOUNTER — Encounter (INDEPENDENT_AMBULATORY_CARE_PROVIDER_SITE_OTHER): Payer: Medicare Other

## 2012-06-11 VITALS — BP 104/62 | HR 100 | Temp 98.4°F | Ht 66.0 in | Wt 147.0 lb

## 2012-06-11 DIAGNOSIS — J449 Chronic obstructive pulmonary disease, unspecified: Secondary | ICD-10-CM

## 2012-06-11 DIAGNOSIS — R0602 Shortness of breath: Secondary | ICD-10-CM

## 2012-06-11 DIAGNOSIS — J9620 Acute and chronic respiratory failure, unspecified whether with hypoxia or hypercapnia: Secondary | ICD-10-CM

## 2012-06-11 NOTE — Assessment & Plan Note (Signed)
O2 dependent COPD -  Unable to perform PFTs Patient's medications were reviewed today and patient education was given. Computerized medication calendar was adjusted/completed   Plan:  Continue oxygen Increase Pulmicort inhaler 2 puffs twice daily Increase  xopenex nebulizer 4 times daily and Ipratropium  nebulizer 4 times daily  Follow med calendar closely and bring to each visit.  follow up Dr. Marchelle Gearing in 4 weeks and As needed    Please contact office for sooner follow up if symptoms do not improve or worsen or seek emergency care

## 2012-06-11 NOTE — Patient Instructions (Addendum)
Continue oxygen Continue cpap at night Increase Pulmicort inhaler 2 puffs twice daily Increase  xopenex nebulizer 4 times daily and Ipratropium  nebulizer 4 times daily  Follow med calendar closely and bring to each visit.  follow up Dr. Marchelle Gearing in 4 weeks and As needed    Please contact office for sooner follow up if symptoms do not improve or worsen or seek emergency care

## 2012-06-11 NOTE — Progress Notes (Signed)
Subjective:    Patient ID: [name], female    DOB: [birth year], 78 y.o.   MRN: [id number]  HPI  PCP is Roxy Manns, MD   #Acute Respiratory Failure #Chronic Respiratory Failure / COPD - June 2010 - intubated with acinetobacter (resistant) lower lobe pneumonia following ankle fracture  - Admited 04/21/12 - 05/05/12 - Rx Bipap. Discharged on nocturnal NIPPV  #Tobacco Abuse  - quit Oct 2013 post admission  #. Acute on Chronic Diastolic CHF  - EF 55-60% by echo 04/22/12  #Cachexia - Weight 150-->143lbs  - Body mass index is 22.55 kg/(m^2).  # Atrial Fibrillation w/ RVR  - H/o PAF. Not anticoagulation candidate due to h/o AVM/GIB, dementia, and fall risk  - Conservative therapy with rate control   #Other issues   -  Diabetes Mellitus Type 2, Anemia of chronic disease    OV 05/14/12  - Followup followin recent admission above HCAP/AECOPD/Acute on chronic respiratory failure. She is her with son and daughter in law. She is in SNF. She is getting stronger and doing some rehab. She looks better. No resp complaints. She has quit smoking an swears she will never smoke again. Meds reviewed and noticed she is on mdi. SHe looks too frail to do mdi. Son pleased with her progres  06/11/2012 Follow up and Med review  Patient returns for a one-month followup and medication review.  She has brought all of her medications in today for review. She has now been discharged from rehabilitation center back to home. She is living with her son. We reviewed all her medications and organized them into a medication calendar. Unfortunately, she misunderstood instructions were, it was not communicated at discharge to rehabilitation center. She is not taking her nebulizers as recommended from last visit. She is also only taking Pulmicort inhaler once daily. She is slowly getting stronger and is now able to walk with her walker and do independent. Dressing. She does require assistance with bathing in the  shower, mainly with only balance issues. Her dyspnea has decreased in her activity tolerance is slowly improving as long as she wears her oxygen. She continues on CPAP at night and has an upcoming sleep study. Next month. Her family is concerned because they're currently pain, for CPAP out-of-pocket, and this has not been ran to her insurance. She did have hypercarbia during her last admission with a PCO2 of 71. She denies any hemoptysis, orthopnea, PND, or increased leg swelling She was supposed to undergo a pulmonary function test. This morning, however, was unable to complete the test, you to inability to hold her breath    Past Medical History  Diagnosis Date  . Allergy     allergic Rhintis  . Hyperlipidemia   . Hypertension   . Osteoporosis   . PAF (paroxysmal atrial fibrillation)     a. PAF dates back > 5 yrs, prev on coumadin->d/c 2006 2/2 GIB;   . Carotid stenosis     a. 09/2007: Carotid Doppler stable- stable bilateral stenosis 40-59% (from 06)  . Type II diabetes mellitus   . Tobacco abuse     a. ongoing - 50+ pack yr hx.  . Dementia     Dementia in Hospital //Does O.K. at home.  . Diverticulosis   . GI bleeding     a. when on coumadin in 2006 - colonoscopy @ that time showed 2 large right colon avm's, multiple small bowel avm's, fresh blood in prox small bowel.  . AVM (arteriovenous malformation)  of colon     a. 2006 colonscopy  . Chronic respiratory failure   . COPD (chronic obstructive pulmonary disease)   . Chronic diastolic CHF (congestive heart failure)   . Normocytic anemia 03/2012     Family History  Problem Relation Age of Onset  . Stroke Mother     a. believes she died suddenly @ 36.  . Diabetes Mother   . Stroke Father     Died from cerebral hemorrhage @ 16  . Diabetes Father   . Cancer Sister     colon and stomach cancer  . Diabetes Brother   . Heart disease Brother   . Cancer Brother     died from lung cancer  . Heart disease Brother     MI  .  Cancer Sister     ? liver//brain cancer  . Heart disease Sister   . Diabetes Sister     diabetes and glaucoma  . Cancer Brother     pancreatic cancer  . Diabetes Son      History   Social History  . Marital Status: Widowed    Spouse Name: N/A    Number of Children: N/A  . Years of Education: N/A   Occupational History  . Not on file.   Social History Main Topics  . Smoking status: Former Smoker -- 0.5 packs/day for 50 years    Types: Cigarettes    Quit date: 04/21/2012  . Smokeless tobacco: Never Used  . Alcohol Use: No  . Drug Use: No  . Sexually Active: Not on file   Other Topics Concern  . Not on file   Social History Narrative   Lives in [place] by herself.     Allergies  Allergen Reactions  . Azithromycin     REACTION: inc blood sugar  . Codeine Itching  . Coumadin (Warfarin) Other (See Comments)    Causes bleeding  . Oxybutynin Chloride Er     Constipation/ dizziness/ dry mouth        Review of Systems  Constitutional:   No  weight loss, night sweats,  Fevers, chills, + fatigue, or  lassitude.  HEENT:   No headaches,  Difficulty swallowing,  Tooth/dental problems, or  Sore throat,                No sneezing, itching, ear ache, nasal congestion, post nasal drip,   CV:  No chest pain,  Orthopnea, PND, swelling in lower extremities, anasarca, dizziness, palpitations, syncope.   GI  No heartburn, indigestion, abdominal pain, nausea, vomiting, diarrhea, change in bowel habits, loss of appetite, bloody stools.   Resp: No coughing up of blood.   No chest wall deformity  Skin: no rash or lesions.  GU: no dysuria, change in color of urine, no urgency or frequency.  No flank pain, no hematuria   MS:  No joint pain or swelling.  No decreased range of motion.  No back pain.  Psych:  No change in mood or affect. No depression or anxiety.  No memory loss.          Objective:   Physical Exam  GEN: A/Ox3; pleasant , NAD, elderly and  chronically ill appearing   HEENT:  [place]/AT,  EACs-clear, TMs-wnl, NOSE-clear, THROAT-clear, no lesions, no postnasal drip or exudate noted.   NECK:  Supple w/ fair ROM; no JVD; normal carotid impulses w/o bruits; no thyromegaly or nodules palpated; no lymphadenopathy.  RESP  Diminshed BS in bases .no accessory muscle  use, no dullness to percussion  CARD:  RRR, no m/r/g  , no peripheral edema, pulses intact, no cyanosis or clubbing.  GI:   Soft & nt; nml bowel sounds; no organomegaly or masses detected.  Musco: Warm bil, no deformities or joint swelling noted.   Neuro: alert, no focal deficits noted.    Skin: Warm, no lesions or rashes       Assessment & Plan:

## 2012-06-11 NOTE — Addendum Note (Signed)
Addended by: Boone Master E on: 06/11/2012 03:19 PM   Modules accepted: Orders

## 2012-06-11 NOTE — Assessment & Plan Note (Signed)
Hypercarbia w/ underlying COPD - BIPAP used during acute periord  And discharged  On at At bedtime   Will need to check on BIPAP insurance coverage.  Sleep study pending next month

## 2012-06-12 ENCOUNTER — Telehealth: Payer: Self-pay | Admitting: Internal Medicine

## 2012-06-12 NOTE — Telephone Encounter (Signed)
Pt is scheduled for sleep study 06/21/12 8 PM. ATC lecretia's # but is forward to [facility] # which they are closed. [facility] Monday morning to discuss with lecretia

## 2012-06-15 ENCOUNTER — Ambulatory Visit (INDEPENDENT_AMBULATORY_CARE_PROVIDER_SITE_OTHER): Payer: Medicare Other | Admitting: Family Medicine

## 2012-06-15 ENCOUNTER — Encounter: Payer: Self-pay | Admitting: Family Medicine

## 2012-06-15 ENCOUNTER — Ambulatory Visit (INDEPENDENT_AMBULATORY_CARE_PROVIDER_SITE_OTHER): Payer: Medicare Other | Admitting: Internal Medicine

## 2012-06-15 ENCOUNTER — Encounter: Payer: Self-pay | Admitting: Internal Medicine

## 2012-06-15 VITALS — BP 142/68 | HR 96 | Temp 98.5°F | Ht 67.0 in | Wt 145.8 lb

## 2012-06-15 VITALS — BP 130/78 | HR 100 | Ht 67.0 in | Wt 144.0 lb

## 2012-06-15 DIAGNOSIS — K224 Dyskinesia of esophagus: Secondary | ICD-10-CM

## 2012-06-15 DIAGNOSIS — I10 Essential (primary) hypertension: Secondary | ICD-10-CM

## 2012-06-15 DIAGNOSIS — R1310 Dysphagia, unspecified: Secondary | ICD-10-CM

## 2012-06-15 DIAGNOSIS — K219 Gastro-esophageal reflux disease without esophagitis: Secondary | ICD-10-CM

## 2012-06-15 DIAGNOSIS — E119 Type 2 diabetes mellitus without complications: Secondary | ICD-10-CM

## 2012-06-15 DIAGNOSIS — E7800 Pure hypercholesterolemia, unspecified: Secondary | ICD-10-CM

## 2012-06-15 LAB — LIPID PANEL
Cholesterol: 138 mg/dL (ref 0–200)
Total CHOL/HDL Ratio: 3
Triglycerides: 134 mg/dL (ref 0.0–149.0)

## 2012-06-15 LAB — COMPREHENSIVE METABOLIC PANEL
Albumin: 4.3 g/dL (ref 3.5–5.2)
BUN: 18 mg/dL (ref 6–23)
Calcium: 9.6 mg/dL (ref 8.4–10.5)
Chloride: 100 mEq/L (ref 96–112)
Glucose, Bld: 174 mg/dL — ABNORMAL HIGH (ref 70–99)
Potassium: 4.9 mEq/L (ref 3.5–5.1)

## 2012-06-15 MED ORDER — POTASSIUM CHLORIDE CRYS ER 20 MEQ PO TBCR: 20.0000 meq | EXTENDED_RELEASE_TABLET | Freq: Every day | ORAL | Status: DC

## 2012-06-15 MED ORDER — LANSOPRAZOLE 30 MG PO CPDR: 30.0000 mg | DELAYED_RELEASE_CAPSULE | Freq: Every day | ORAL | Status: DC

## 2012-06-15 MED ORDER — LEVALBUTEROL HCL 0.63 MG/3ML IN NEBU: 1.0000 | INHALATION_SOLUTION | Freq: Four times a day (QID) | RESPIRATORY_TRACT | Status: DC

## 2012-06-15 MED ORDER — DILTIAZEM HCL ER COATED BEADS 240 MG PO CP24: 240.0000 mg | ORAL_CAPSULE | Freq: Every day | ORAL | Status: DC

## 2012-06-15 MED ORDER — GLIPIZIDE ER 5 MG PO TB24: 5.0000 mg | ORAL_TABLET | Freq: Every day | ORAL | Status: DC

## 2012-06-15 MED ORDER — IPRATROPIUM BROMIDE 0.02 % IN SOLN: 500.0000 ug | Freq: Four times a day (QID) | RESPIRATORY_TRACT | Status: DC

## 2012-06-15 MED ORDER — BUDESONIDE 90 MCG/ACT IN AEPB: 2.0000 | INHALATION_SPRAY | Freq: Two times a day (BID) | RESPIRATORY_TRACT | Status: DC

## 2012-06-15 MED ORDER — METFORMIN HCL 500 MG PO TABS: 500.0000 mg | ORAL_TABLET | Freq: Two times a day (BID) | ORAL | Status: DC

## 2012-06-15 MED ORDER — ATORVASTATIN CALCIUM 10 MG PO TABS: 10.0000 mg | ORAL_TABLET | Freq: Every day | ORAL | Status: DC

## 2012-06-15 NOTE — Progress Notes (Signed)
Subjective:    Patient ID: [name], female    DOB: 1930/03/26, 78 y.o.   MRN: [id number]  HPI Here for f/u of chronic medical problems   Is back at home after hosp stay for pneumonia/aspiriation and then SNF stay Lives with her son now Quit smoking - is proudl of that   Is on O2 now day and night    Wt loss-now leveling off Seeing pulm for sleep apnea and also copd   Seeing cardiol for chf/ afib (not anticoag candidate) Also taking lipitor for cholesterol  Lab Results  Component Value Date   CHOL 129 04/22/2012   HDL 51 04/22/2012   LDLCALC 54 04/22/2012   TRIG 121 04/22/2012   CHOLHDL 2.5 04/22/2012   has been on atorvastatin at night    DM Lab Results  Component Value Date   HGBA1C 7.6* 04/22/2012   changed her medications - was running high- now is going down  Was given a px for novolog to use in am - from [facility]  Has been using novolog 5 u in am only if sugar over 150  Was on glucotrol   Is checking sugar in ams only -- and needs strips-will start checking bid  In am 170s-low 200s for the most part     bp is stable today  No cp or palpitations or headaches or edema  No side effects to medicines  BP Readings from Last 3 Encounters:  06/15/12 142/68  06/11/12 104/62  05/19/12 112/64     Had her pneumovax and her flu shot   Some memory issues  Some trouble with remembering meds / coordination and also issues with self care  No focal stroke symptoms - but worries about this  She has afib and is not a candidate for anticoagulation    Patient Active Problem List  Diagnosis  . HYPERCHOLESTEROLEMIA  . ANEMIA  . TOBACCO USE  . MORTON'S NEUROMA  . MACULAR DEGENERATION  . HYPERTENSION  . Atrial fibrillation  . ALLERGIC RHINITIS  . OSTEOPOROSIS  . DIABETES MELLITUS, TYPE II  . UNSPECIFIED VITAMIN D DEFICIENCY  . Grief reaction  . Adverse reaction  . Mixed incontinence urge and stress  . Dementia  . Acute diastolic CHF (congestive  heart failure)  . COPD (chronic obstructive pulmonary disease)  . Acute and chronic respiratory failure   Past Medical History  Diagnosis Date  . Allergy     allergic Rhintis  . Hyperlipidemia   . Hypertension   . Osteoporosis   . PAF (paroxysmal atrial fibrillation)     a. PAF dates back > 5 yrs, prev on coumadin->d/c 2006 2/2 GIB;   . Carotid stenosis     a. 09/2007: Carotid Doppler stable- stable bilateral stenosis 40-59% (from 06)  . Type II diabetes mellitus   . Tobacco abuse     a. ongoing - 50+ pack yr hx.  . Dementia     Dementia in Hospital //Does O.K. at home.  . Diverticulosis   . GI bleeding     a. when on coumadin in 2006 - colonoscopy @ that time showed 2 large right colon avm's, multiple small bowel avm's, fresh blood in prox small bowel.  . AVM (arteriovenous malformation) of colon     a. 2006 colonscopy  . Chronic respiratory failure   . COPD (chronic obstructive pulmonary disease)   . Chronic diastolic CHF (congestive heart failure)   . Normocytic anemia 03/2012   Past Surgical History  Procedure Date  . Cardiac catheterization 1990's    clear  . Orif tibia & fibula fractures 05/2004  . Small bowel enteroscopy 03/2005    AVMS  . Colonoscopy 08/2004  . Esophagogastroduodenoscopy 08/2004    Multiple    History  Substance Use Topics  . Smoking status: Former Smoker -- 0.5 packs/day for 50 years    Types: Cigarettes    Quit date: 04/21/2012  . Smokeless tobacco: Never Used  . Alcohol Use: No   Family History  Problem Relation Age of Onset  . Stroke Mother     a. believes she died suddenly @ 41.  . Diabetes Mother   . Stroke Father     Died from cerebral hemorrhage @ 21  . Diabetes Father   . Cancer Sister     colon and stomach cancer  . Diabetes Brother   . Heart disease Brother   . Cancer Brother     died from lung cancer  . Heart disease Brother     MI  . Cancer Sister     ? liver//brain cancer  . Heart disease Sister   . Diabetes  Sister     diabetes and glaucoma  . Cancer Brother     pancreatic cancer  . Diabetes Son    Allergies  Allergen Reactions  . Azithromycin     REACTION: inc blood sugar  . Codeine Itching  . Coumadin (Warfarin) Other (See Comments)    Causes bleeding  . Oxybutynin Chloride Er     Constipation/ dizziness/ dry mouth   Current Outpatient Prescriptions on File Prior to Visit  Medication Sig Dispense Refill  . aspirin 81 MG tablet Take 81 mg by mouth daily.        [name] atorvastatin (LIPITOR) 10 MG tablet Take 10 mg by mouth daily.      . Budesonide (PULMICORT FLEXHALER) 90 MCG/ACT inhaler Inhale 2 puffs into the lungs 2 (two) times daily.  1 Inhaler  0  . Cholecalciferol (VITAMIN D3) 2000 UNITS capsule Take 2,000 Units by mouth daily.        . digoxin (LANOXIN) 0.125 MG tablet Take 1 tablet (0.125 mg total) by mouth daily.  30 tablet  6  . diltiazem (CARDIZEM CD) 240 MG 24 hr capsule Take 1 capsule (240 mg total) by mouth daily.  30 capsule  6  . fluticasone (FLONASE) 50 MCG/ACT nasal spray Place 2 sprays into the nose daily.      . furosemide (LASIX) 40 MG tablet Take 1 tablet (40 mg total) by mouth daily.  30 tablet  6  . glucose blood (ONE TOUCH ULTRA TEST) test strip 1 each by Other route as needed. Use as instructed Check blood sugar daily and as needed       . insulin aspart (NOVOLOG) 100 UNIT/ML injection Inject 5 Units into the skin daily.      [name] ipratropium (ATROVENT HFA) 17 MCG/ACT inhaler Inhale 2 puffs into the lungs 4 (four) times daily.  1 Inhaler  0  . ipratropium (ATROVENT) 0.02 % nebulizer solution Take 2.5 mLs (500 mcg total) by nebulization 4 (four) times daily.  300 mL  12  . levalbuterol (XOPENEX HFA) 45 MCG/ACT inhaler Inhale 2 puffs into the lungs every 4 (four) hours as needed for wheezing or shortness of breath.  1 Inhaler  0  . levalbuterol (XOPENEX) 0.63 MG/3ML nebulizer solution Take 3 mLs (0.63 mg total) by nebulization 4 (four) times daily.  360 mL  12  .  metFORMIN (GLUCOPHAGE) 500 MG tablet Take 1 tablet (500 mg total) by mouth 2 (two) times daily before a meal.  60 tablet  0  . potassium chloride SA (K-DUR,KLOR-CON) 20 MEQ tablet Take 1 tablet (20 mEq total) by mouth daily.  30 tablet  6    Review of Systems Review of Systems  Constitutional: Negative for fever, appetite change,  and unexpected weight change. fatigue is improved and weight is leveling off  Eyes: Negative for pain and visual disturbance.  Respiratory: Negative for cough and pos for intermittent wheeze and sob Cardiovascular: Negative for cp or palpitations   neg for pedal edema today/ PND and orthopnea  Gastrointestinal: Negative for nausea, diarrhea and constipation.  Genitourinary: Negative for urgency and frequency.  Skin: Negative for pallor or rash   Neurological: Negative for weakness, light-headedness, numbness and headaches. pos for difficulty concentrating  Hematological: Negative for adenopathy. Does not bruise/bleed easily.  Psychiatric/Behavioral: Negative for dysphoric mood. The patient is not nervous/anxious.         Objective:   Physical Exam  Constitutional: She appears well-developed and well-nourished. No distress.       Frail appearing elderly female  HENT:  Head: Normocephalic and atraumatic.  Mouth/Throat: Oropharynx is clear and moist.  Eyes: Conjunctivae normal and EOM are normal. Pupils are equal, round, and reactive to light. Left eye exhibits no discharge. No scleral icterus.  Neck: Normal range of motion. Neck supple. No JVD present. Carotid bruit is not present. No thyromegaly present.  Cardiovascular: Normal rate, regular rhythm, normal heart sounds and intact distal pulses.  Exam reveals no gallop.   Pulmonary/Chest: Effort normal and breath sounds normal. No respiratory distress. She has no wheezes.       Diffusely distant bs  No wheezes today - fair air exch   Abdominal: Soft. Bowel sounds are normal. She exhibits no distension, no  abdominal bruit and no mass. There is no tenderness.  Musculoskeletal: She exhibits no edema and no tenderness.  Lymphadenopathy:    She has no cervical adenopathy.  Neurological: She is alert. She has normal reflexes. No cranial nerve deficit. She exhibits normal muscle tone. Coordination normal.  Skin: Skin is warm and dry. No rash noted. No erythema. No pallor.  Psychiatric: She has a normal mood and affect.       Talkative and in good spirits Supportive family present          Assessment & Plan:

## 2012-06-15 NOTE — Patient Instructions (Addendum)
Today we have given you a Dysphagia diet to read and follow level four.  We have sent the following medications to your pharmacy for you to pick up at your convenience: Generic Prevacid  Follow up with [place] as needed.  Thank you for choosing me and [facility].  Iva Boop, M.D., [facility]

## 2012-06-15 NOTE — Progress Notes (Signed)
Subjective:    Patient ID: [name], female    DOB: [birth year], 78 y.o.   MRN: [id number] Referred by: Tower, Audrie Gallus, MD  HPI This elderly lady presents at the request of her hospital physicians after being hospitalized with a pneumonia. There was some question of whether or not she might have aspirated. Her last pneumonia prior to this recent one in the fall was in 2010. She has a history of known dysphagia and swallowing problems I thought to have been related to esophageal dysmotility. Her last upper endoscopy was in 2006, and empiric Elease Hashimoto dilation was carried out and seemed to benefit her at that time.  She is here with her family members today. She reports that she has to put some pills in applesauce to swallow them. She last had impact food dysphagia 6 months ago with biscuits and soft edge. She is modified her diet and is chewing very carefully and staying upright while eating in for an hour or so afterwards and doing okay. There are rare episodes were she might cough or choke when she swallows liquids but that on a consistent basis, and she did have a modified barium swallow there did not limited her diet as far as liquids.  Spicy foods or citrus-based foods will promote heartburn and she has avoided them.  She struggles with constipation chronically, it is somewhat worse since she's had some debilitation. She is currently staying with her son, she uses a walker she's on home O2 chronically. She is significant COPD. Prune juice relieves the constipation and is working quite well.  Medications, allergies, past medical history, past surgical history, family history and social history are reviewed and updated in the EMR.   Review of Systems She has been weak but is recovering, there's been some confusion at times, she's been very thirsty and had excessive urination and some urinary incontinence. There is chronic dyspnea. She ambulates with a walker. All other review of systems negative  at this time or as per history of present illness.    Objective:   Physical Exam General:  Elderly, chronically ill, on O2 in no acute distress Eyes:  anicteric. ENT:   Mouth and posterior pharynx free of lesions. + dentures Neck:   supple w/o thyromegaly or mass.  Lungs: Clear to auscultation bilaterally. Decreased throuhghout Heart:  S1S2, no rubs, murmurs, gallops.- distant Abdomen:  soft, non-tender, no hepatosplenomegaly, hernia, or mass and BS+.  Lymph:  no cervical or supraclavicular adenopathy. Extremities:   no edema Skin   no rash. Neuro:  A&O x 3.  Psych:  appropriate mood and  Affect.   Data Reviewed: Hopsital discharge summaries, consultations, x-ray reports. Speech pathology workup.     Assessment & Plan:   1. Dysphagia   2. Esophageal dysmotility suspected   3. GERD (gastroesophageal reflux disease)    1. Start lansoprazole 30 mg daily for GERD and dysmotility issues thought related to chronic GERD. 2. Continue a modified diet, I've asked her use a level for which is basically a chopped diet. Instructions given. 3. Consider crushing that potassium or using and elects are resolving it. Though it seems like putting in applesauce is helping. 4. Try to avoid invasive procedures, I still think further investigation would change how we treat her and so we have decided not to do any testing at this time. She is somewhat frail at least, if not worse and that and I don't think she would tolerate any complications well. If she has  worsening problems then a barium swallow or upper endoscopy at the hospital could be considered with possible esophageal dilation but she seems to be managing at this point and I would continue what she's doing with the addition of the PPI which may help more. 5. She may have aspirated prior or recent hospitalization, but she has not had chronic recurrent respiratory infections so I am comfortable with this approach. The patient and her family are as  well. 6. I will see her back as needed  I appreciate the opportunity to care for this patient.  CC: Roxy Manns, MD

## 2012-06-15 NOTE — Addendum Note (Signed)
Addended by: Boone Master E on: 06/15/2012 03:03 PM   Modules accepted: Orders

## 2012-06-15 NOTE — Assessment & Plan Note (Signed)
Not due for a1c yet - but sugar went up significantly with hosp and illness She was much better controlled when on glucotrol - will start low dose of that back and hold the novolog Disc s/s hypoglycemia in detail to watch for  Pt does not skip meals and is watched closely by family -living with her son  Will send glucose report (bid check) in about 2 weeks  F/u 3 mo

## 2012-06-15 NOTE — Patient Instructions (Addendum)
Add glucotrol in am  Hold the novolog Check sugar am and 2 hours after pm meal  Lab today  Update me with some sugar results in about 2 weeks  Follow up with me in about 3 months

## 2012-06-15 NOTE — Assessment & Plan Note (Signed)
bp in fair control at this time  No changes needed  Disc lifstyle change with low sodium diet and exercise  Lab today 

## 2012-06-15 NOTE — Assessment & Plan Note (Signed)
Now on atrovastatin  Lab today and update  Stressed imp of low sat fat diet

## 2012-06-16 ENCOUNTER — Encounter (HOSPITAL_COMMUNITY): Payer: Self-pay | Admitting: Emergency Medicine

## 2012-06-16 ENCOUNTER — Encounter: Payer: Self-pay | Admitting: *Deleted

## 2012-06-16 ENCOUNTER — Emergency Department (HOSPITAL_COMMUNITY): Payer: Medicare Other

## 2012-06-16 ENCOUNTER — Observation Stay (HOSPITAL_COMMUNITY): Payer: Medicare Other

## 2012-06-16 ENCOUNTER — Inpatient Hospital Stay (HOSPITAL_COMMUNITY)
Admission: EM | Admit: 2012-06-16 | Discharge: 2012-06-18 | DRG: 167 | Disposition: A | Discharge status: Home / Self Care | Payer: Medicare Other | Attending: Internal Medicine | Admitting: Internal Medicine

## 2012-06-16 DIAGNOSIS — I509 Heart failure, unspecified: Secondary | ICD-10-CM | POA: Diagnosis present

## 2012-06-16 DIAGNOSIS — J309 Allergic rhinitis, unspecified: Secondary | ICD-10-CM

## 2012-06-16 DIAGNOSIS — H3530 Unspecified macular degeneration: Secondary | ICD-10-CM

## 2012-06-16 DIAGNOSIS — Z7982 Long term (current) use of aspirin: Secondary | ICD-10-CM

## 2012-06-16 DIAGNOSIS — Z87891 Personal history of nicotine dependence: Secondary | ICD-10-CM

## 2012-06-16 DIAGNOSIS — E7800 Pure hypercholesterolemia, unspecified: Secondary | ICD-10-CM

## 2012-06-16 DIAGNOSIS — J9620 Acute and chronic respiratory failure, unspecified whether with hypoxia or hypercapnia: Secondary | ICD-10-CM

## 2012-06-16 DIAGNOSIS — Z79899 Other long term (current) drug therapy: Secondary | ICD-10-CM

## 2012-06-16 DIAGNOSIS — J449 Chronic obstructive pulmonary disease, unspecified: Secondary | ICD-10-CM

## 2012-06-16 DIAGNOSIS — M810 Age-related osteoporosis without current pathological fracture: Secondary | ICD-10-CM

## 2012-06-16 DIAGNOSIS — J9610 Chronic respiratory failure, unspecified whether with hypoxia or hypercapnia: Secondary | ICD-10-CM | POA: Diagnosis present

## 2012-06-16 DIAGNOSIS — N3946 Mixed incontinence: Secondary | ICD-10-CM

## 2012-06-16 DIAGNOSIS — K224 Dyskinesia of esophagus: Secondary | ICD-10-CM

## 2012-06-16 DIAGNOSIS — I2699 Other pulmonary embolism without acute cor pulmonale: Principal | ICD-10-CM

## 2012-06-16 DIAGNOSIS — I4891 Unspecified atrial fibrillation: Secondary | ICD-10-CM

## 2012-06-16 DIAGNOSIS — K219 Gastro-esophageal reflux disease without esophagitis: Secondary | ICD-10-CM

## 2012-06-16 DIAGNOSIS — F0390 Unspecified dementia without behavioral disturbance: Secondary | ICD-10-CM

## 2012-06-16 DIAGNOSIS — D649 Anemia, unspecified: Secondary | ICD-10-CM

## 2012-06-16 DIAGNOSIS — R079 Chest pain, unspecified: Secondary | ICD-10-CM | POA: Diagnosis present

## 2012-06-16 DIAGNOSIS — E559 Vitamin D deficiency, unspecified: Secondary | ICD-10-CM

## 2012-06-16 DIAGNOSIS — I480 Paroxysmal atrial fibrillation: Secondary | ICD-10-CM | POA: Diagnosis present

## 2012-06-16 DIAGNOSIS — I5031 Acute diastolic (congestive) heart failure: Secondary | ICD-10-CM

## 2012-06-16 DIAGNOSIS — I5032 Chronic diastolic (congestive) heart failure: Secondary | ICD-10-CM | POA: Diagnosis present

## 2012-06-16 DIAGNOSIS — G5760 Lesion of plantar nerve, unspecified lower limb: Secondary | ICD-10-CM

## 2012-06-16 DIAGNOSIS — Z86711 Personal history of pulmonary embolism: Secondary | ICD-10-CM

## 2012-06-16 DIAGNOSIS — I10 Essential (primary) hypertension: Secondary | ICD-10-CM | POA: Diagnosis present

## 2012-06-16 DIAGNOSIS — E785 Hyperlipidemia, unspecified: Secondary | ICD-10-CM | POA: Diagnosis present

## 2012-06-16 DIAGNOSIS — E119 Type 2 diabetes mellitus without complications: Secondary | ICD-10-CM | POA: Diagnosis present

## 2012-06-16 HISTORY — DX: Gastro-esophageal reflux disease without esophagitis: K21.9

## 2012-06-16 HISTORY — DX: Adverse effect of unspecified anesthetic, initial encounter: T41.45XA

## 2012-06-16 HISTORY — DX: Other specified postprocedural states: Z98.890

## 2012-06-16 HISTORY — DX: Other complications of anesthesia, initial encounter: T88.59XA

## 2012-06-16 HISTORY — DX: Other specified postprocedural states: R11.2

## 2012-06-16 LAB — TROPONIN I
Troponin I: <0.3 ng/mL (ref <0.30)
Troponin I: <0.3 ng/mL (ref <0.30)

## 2012-06-16 LAB — PRO B NATRIURETIC PEPTIDE: Pro B Natriuretic peptide (BNP): 666.7 pg/mL — ABNORMAL HIGH (ref 0–450)

## 2012-06-16 LAB — BASIC METABOLIC PANEL
CO2: 30 mEq/L (ref 19–32)
Calcium: 9.3 mg/dL (ref 8.4–10.5)
Creatinine, Ser: 0.78 mg/dL (ref 0.50–1.10)
GFR calc non Af Amer: 77 mL/min — ABNORMAL LOW (ref >90)
Glucose, Bld: 175 mg/dL — ABNORMAL HIGH (ref 70–99)
Sodium: 141 mEq/L (ref 135–145)

## 2012-06-16 LAB — DIGOXIN LEVEL: Digoxin Level: 0.9 ng/mL (ref 0.8–2.0)

## 2012-06-16 LAB — URINALYSIS, ROUTINE W REFLEX MICROSCOPIC
Hgb urine dipstick: NEGATIVE
Ketones, ur: 15 mg/dL — AB
Nitrite: NEGATIVE
Protein, ur: NEGATIVE mg/dL
Urobilinogen, UA: 1 mg/dL (ref 0.0–1.0)

## 2012-06-16 LAB — CBC WITH DIFFERENTIAL/PLATELET
Basophils Absolute: 0 10*3/uL (ref 0.0–0.1)
Basophils Relative: 0 % (ref 0–1)
Eosinophils Absolute: 0.1 10*3/uL (ref 0.0–0.7)
Eosinophils Relative: 1 % (ref 0–5)
MCH: 29.7 pg (ref 26.0–34.0)
MCV: 90.4 fL (ref 78.0–100.0)
Neutrophils Relative %: 74 % (ref 43–77)
Platelets: 206 10*3/uL (ref 150–400)
RBC: 3.97 MIL/uL (ref 3.87–5.11)
RDW: 13.2 % (ref 11.5–15.5)

## 2012-06-16 LAB — GLUCOSE, CAPILLARY: Glucose-Capillary: 224 mg/dL — ABNORMAL HIGH (ref 70–99)

## 2012-06-16 LAB — D-DIMER, QUANTITATIVE: D-Dimer, Quant: 0.75 ug/mL-FEU — ABNORMAL HIGH (ref 0.00–0.48)

## 2012-06-16 MED ORDER — ASPIRIN EC 325 MG PO TBEC
325.0000 mg | DELAYED_RELEASE_TABLET | Freq: Every day | ORAL | Status: DC
  Administered 2012-06-17 – 2012-06-18 (×2): 325 mg via ORAL
  Filled 2012-06-16 (×2): qty 1

## 2012-06-16 MED ORDER — ONDANSETRON HCL 4 MG/2ML IJ SOLN: 4.0000 mg | Freq: Four times a day (QID) | INTRAMUSCULAR | Status: DC | PRN

## 2012-06-16 MED ORDER — FLUTICASONE PROPIONATE HFA 44 MCG/ACT IN AERO
2.0000 | INHALATION_SPRAY | Freq: Two times a day (BID) | RESPIRATORY_TRACT | Status: DC
  Administered 2012-06-17 – 2012-06-18 (×3): 2 via RESPIRATORY_TRACT
  Filled 2012-06-16: qty 10.6

## 2012-06-16 MED ORDER — PANTOPRAZOLE SODIUM 40 MG PO TBEC
40.0000 mg | DELAYED_RELEASE_TABLET | Freq: Every day | ORAL | Status: DC
  Administered 2012-06-16 – 2012-06-18 (×3): 40 mg via ORAL
  Filled 2012-06-16 (×2): qty 1

## 2012-06-16 MED ORDER — ONDANSETRON HCL 4 MG PO TABS: 4.0000 mg | ORAL_TABLET | Freq: Four times a day (QID) | ORAL | Status: DC | PRN

## 2012-06-16 MED ORDER — POTASSIUM CHLORIDE CRYS ER 20 MEQ PO TBCR
20.0000 meq | EXTENDED_RELEASE_TABLET | Freq: Every day | ORAL | Status: DC
  Administered 2012-06-16 – 2012-06-18 (×3): 20 meq via ORAL
  Filled 2012-06-16 (×4): qty 1

## 2012-06-16 MED ORDER — ASPIRIN 81 MG PO CHEW
324.0000 mg | CHEWABLE_TABLET | Freq: Once | ORAL | Status: AC
  Administered 2012-06-16: 324 mg via ORAL
  Filled 2012-06-16: qty 4

## 2012-06-16 MED ORDER — IPRATROPIUM BROMIDE HFA 17 MCG/ACT IN AERS: 2.0000 | INHALATION_SPRAY | Freq: Four times a day (QID) | RESPIRATORY_TRACT | Status: DC

## 2012-06-16 MED ORDER — MORPHINE SULFATE 2 MG/ML IJ SOLN: 1.0000 mg | INTRAMUSCULAR | Status: DC | PRN

## 2012-06-16 MED ORDER — LEVALBUTEROL TARTRATE 45 MCG/ACT IN AERO
2.0000 | INHALATION_SPRAY | RESPIRATORY_TRACT | Status: DC | PRN
  Filled 2012-06-16: qty 15

## 2012-06-16 MED ORDER — ACETAMINOPHEN 325 MG PO TABS: 650.0000 mg | ORAL_TABLET | Freq: Four times a day (QID) | ORAL | Status: DC | PRN

## 2012-06-16 MED ORDER — NITROGLYCERIN 0.4 MG SL SUBL: 0.4000 mg | SUBLINGUAL_TABLET | SUBLINGUAL | Status: DC | PRN

## 2012-06-16 MED ORDER — IPRATROPIUM BROMIDE 0.02 % IN SOLN
500.0000 ug | Freq: Four times a day (QID) | RESPIRATORY_TRACT | Status: DC
  Administered 2012-06-17 – 2012-06-18 (×5): 500 ug via RESPIRATORY_TRACT
  Filled 2012-06-16 (×5): qty 2.5

## 2012-06-16 MED ORDER — DIGOXIN 125 MCG PO TABS
0.1250 mg | ORAL_TABLET | Freq: Every day | ORAL | Status: DC
  Administered 2012-06-17 – 2012-06-18 (×2): 0.125 mg via ORAL
  Filled 2012-06-16 (×3): qty 1

## 2012-06-16 MED ORDER — INSULIN ASPART 100 UNIT/ML ~~LOC~~ SOLN
0.0000 [IU] | Freq: Three times a day (TID) | SUBCUTANEOUS | Status: DC
  Administered 2012-06-17: 1 [IU] via SUBCUTANEOUS
  Administered 2012-06-17: 2 [IU] via SUBCUTANEOUS
  Administered 2012-06-17 – 2012-06-18 (×3): 3 [IU] via SUBCUTANEOUS

## 2012-06-16 MED ORDER — METFORMIN HCL 500 MG PO TABS
500.0000 mg | ORAL_TABLET | Freq: Two times a day (BID) | ORAL | Status: DC
  Filled 2012-06-16 (×3): qty 1

## 2012-06-16 MED ORDER — SODIUM CHLORIDE 0.9 % IV SOLN
INTRAVENOUS | Status: DC
  Administered 2012-06-16 – 2012-06-17 (×3): via INTRAVENOUS

## 2012-06-16 MED ORDER — ATORVASTATIN CALCIUM 10 MG PO TABS
10.0000 mg | ORAL_TABLET | Freq: Every day | ORAL | Status: DC
  Administered 2012-06-16 – 2012-06-18 (×3): 10 mg via ORAL
  Filled 2012-06-16 (×3): qty 1

## 2012-06-16 MED ORDER — HYDROCODONE-ACETAMINOPHEN 5-325 MG PO TABS: 1.0000 | ORAL_TABLET | ORAL | Status: DC | PRN

## 2012-06-16 MED ORDER — DILTIAZEM HCL ER COATED BEADS 240 MG PO CP24
240.0000 mg | ORAL_CAPSULE | Freq: Every day | ORAL | Status: DC
  Administered 2012-06-16 – 2012-06-18 (×3): 240 mg via ORAL
  Filled 2012-06-16 (×3): qty 1

## 2012-06-16 MED ORDER — ACETAMINOPHEN 650 MG RE SUPP: 650.0000 mg | Freq: Four times a day (QID) | RECTAL | Status: DC | PRN

## 2012-06-16 MED ORDER — SODIUM CHLORIDE 0.9 % IJ SOLN
3.0000 mL | Freq: Two times a day (BID) | INTRAMUSCULAR | Status: DC
  Administered 2012-06-16 – 2012-06-17 (×2): 3 mL via INTRAVENOUS

## 2012-06-16 NOTE — ED Provider Notes (Signed)
History     CSN: [id number]  Arrival date & time 06/16/12  1126   First MD Initiated Contact with Patient 06/16/12 1137      Chief Complaint  Patient presents with  . Chest Pain  . Back Pain    HPI Pt was seen at 1200.   Per pt, c/o gradual onset and resolution of one episode of chest "pain" that occurred approx 0900 this morning PTA.  Pt states she was walking around at home, performing her ADL's when she felt a "heavy pressure" in her mid-chest "like a donkey kicked me."  CP was associated with SOB, nausea and diaphoresis.  Pt states her symptoms resolved en route to the ED.  Pt also c/o gradual onset and persistence of LBP that began an unknown time ago.  Pt describes the pain as "my kidneys hurt," worses with palpation and movement. Has been associated with urinary frequency and "bad odor" to her urine. Denies palpitations, no cough, no abd pain, no vomiting/diarrhea, no rash, no fevers.  No focal motor weakness, no tingling/numbness in extremities, no saddle anesthesia, no incont/retention of bowel or bladder.    Past Medical History  Diagnosis Date  . Allergy     allergic Rhintis  . Hyperlipidemia   . Hypertension   . Osteoporosis   . PAF (paroxysmal atrial fibrillation)     a. PAF dates back > 5 yrs, prev on coumadin->d/c 2006 2/2 GIB;   . Carotid stenosis     a. 09/2007: Carotid Doppler stable- stable bilateral stenosis 40-59% (from 06)  . Type II diabetes mellitus   . Tobacco abuse     a. ongoing - 50+ pack yr hx.  . Dementia     Dementia in Hospital //Does O.K. at home.  . Diverticulosis   . GI bleeding     a. when on coumadin in 2006 - colonoscopy @ that time showed 2 large right colon avm's, multiple small bowel avm's, fresh blood in prox small bowel.  . AVM (arteriovenous malformation) of colon     a. 2006 colonscopy  . Chronic respiratory failure   . COPD (chronic obstructive pulmonary disease)   . Chronic diastolic CHF (congestive heart failure)   . Normocytic  anemia 03/2012    Past Surgical History  Procedure Date  . Cardiac catheterization 1990's    clear  . Orif tibia & fibula fractures 05/2004  . Small bowel enteroscopy 03/2005    AVMS  . Colonoscopy 08/2004  . Esophagogastroduodenoscopy 08/2004    Multiple     Family History  Problem Relation Age of Onset  . Stroke Mother     a. believes she died suddenly @ 50.  . Diabetes Mother   . Stroke Father     Died from cerebral hemorrhage @ 47  . Diabetes Father   . Cancer Sister     colon and stomach cancer  . Diabetes Brother   . Heart disease Brother   . Cancer Brother     died from lung cancer  . Heart disease Brother     MI  . Cancer Sister     ? liver//brain cancer  . Heart disease Sister   . Diabetes Sister     diabetes and glaucoma  . Cancer Brother     pancreatic cancer  . Diabetes Son     History  Substance Use Topics  . Smoking status: Former Smoker -- 0.5 packs/day for 50 years    Types: Cigarettes  Quit date: 04/21/2012  . Smokeless tobacco: Never Used  . Alcohol Use: No     Review of Systems ROS: Statement: All systems negative except as marked or noted in the HPI; Constitutional: Negative for fever and chills. ; ; Eyes: Negative for eye pain, redness and discharge. ; ; ENMT: Negative for ear pain, hoarseness, nasal congestion, sinus pressure and sore throat. ; ; Cardiovascular: +CP, SOB, diaphoresis. Negative for palpitations, and peripheral edema. ; ; Respiratory: Negative for cough, wheezing and stridor. ; ; Gastrointestinal: +nausea. Negative for vomiting, diarrhea, abdominal pain, blood in stool, hematemesis, jaundice and rectal bleeding. . ; ; Genitourinary: +urinary frequency. Negative for dysuria, flank pain and hematuria. ; ; Musculoskeletal: +LBP. Negative for neck pain. Negative for swelling and trauma.; ; Skin: Negative for pruritus, rash, abrasions, blisters, bruising and skin lesion.; ; Neuro: Negative for headache, lightheadedness and neck  stiffness. Negative for weakness, altered level of consciousness , altered mental status, extremity weakness, paresthesias, involuntary movement, seizure and syncope.     Allergies  Azithromycin; Codeine; Coumadin; and Oxybutynin chloride er  Home Medications   Current Outpatient Rx  Name  Route  Sig  Dispense  Refill  . ASPIRIN 81 MG PO TABS   Oral   Take 81 mg by mouth daily.           . ATORVASTATIN CALCIUM 10 MG PO TABS   Oral   Take 10 mg by mouth daily.         . BUDESONIDE 90 MCG/ACT IN AEPB   Inhalation   Inhale 2 puffs into the lungs 2 (two) times daily.         [name] VITAMIN D3 2000 UNITS PO CAPS   Oral   Take 2,000 Units by mouth daily.          [name] DIGOXIN 0.125 MG PO TABS   Oral   Take 0.125 mg by mouth daily.         [name] DILTIAZEM HCL ER COATED BEADS 240 MG PO CP24   Oral   Take 240 mg by mouth daily.         [name] FLUTICASONE PROPIONATE 50 MCG/ACT NA SUSP   Nasal   Place 2 sprays into the nose daily as needed. For allergies         . FUROSEMIDE 40 MG PO TABS   Oral   Take 20-40 mg by mouth daily. 20 mg daily 40 mg if gains 3 lbs or more.         . IPRATROPIUM BROMIDE HFA 17 MCG/ACT IN AERS   Inhalation   Inhale 2 puffs into the lungs 4 (four) times daily.         . IPRATROPIUM BROMIDE 0.02 % IN SOLN   Nebulization   Take 500 mcg by nebulization 3 (three) times daily.         [name] LEVALBUTEROL HCL 0.63 MG/3ML IN NEBU   Nebulization   Take 1 ampule by nebulization 3 (three) times daily.         [name] METFORMIN HCL 500 MG PO TABS   Oral   Take 500 mg by mouth 2 (two) times daily before a meal.         . POTASSIUM CHLORIDE CRYS ER 20 MEQ PO TBCR   Oral   Take 20 mEq by mouth daily.         [name] GLUCOSE BLOOD VI STRP   Other   1 each by Other route as needed. Use  as instructed Check blood sugar daily and as needed            BP 160/74  Pulse 90  Temp 98.2 F (36.8 C) (Oral)  Resp 25  SpO2 99%  Physical Exam 1205: Physical  examination:  Nursing notes reviewed; Vital signs and O2 SAT reviewed;  Constitutional: Well developed, Well nourished, In no acute distress; Head:  Normocephalic, atraumatic; Eyes: EOMI, PERRL, No scleral icterus; ENMT: Mouth and pharynx normal, Mucous membranes dry; Neck: Supple, Full range of motion, No lymphadenopathy; Cardiovascular: Irregular irregular rate and rhythm, No gallop; Respiratory: Breath sounds clear & equal bilaterally, No rales, rhonchi, wheezes.  Speaking full sentences with ease, Normal respiratory effort/excursion; Chest: Nontender, Movement normal; Abdomen: Soft, Nontender, Nondistended, Normal bowel sounds; Genitourinary: No CVA tenderness; Spine:  No midline CS, TS, LS tenderness.  +TTP bilat lumbar paraspinal muscles. No rash, no ecchymosis.;; Extremities: Pulses normal, No tenderness, No edema, No calf edema or asymmetry.; Neuro: Awake, alert, confused re: events, poor historian, per hx dementia. Major CN grossly intact.  Speech clear. No facial droop. No gross focal motor or sensory deficits in extremities.; Skin: Color normal, Warm, Dry.   ED Course  Procedures    MDM  MDM Reviewed: previous chart, nursing note and vitals Reviewed previous: labs and ECG Interpretation: labs, ECG and x-ray      Date: 06/16/2012  Rate: 95  Rhythm: atrial fibrillation  QRS Axis: normal  Intervals: normal  ST/T Wave abnormalities: nonspecific ST/T changes  Conduction Disutrbances:none  Narrative Interpretation:   Old EKG Reviewed: unchanged; no significant changes from previous EKG dated 04/21/2012.  Results for orders placed during the hospital encounter of 06/16/12  BASIC METABOLIC PANEL      Component Value Range   Sodium 141  135 - 145 mEq/L   Potassium 4.4  3.5 - 5.1 mEq/L   Chloride 101  96 - 112 mEq/L   CO2 30  19 - 32 mEq/L   Glucose, Bld 175 (*) 70 - 99 mg/dL   BUN 15  6 - 23 mg/dL   Creatinine, Ser 1.61  0.50 - 1.10 mg/dL   Calcium 9.3  8.4 - 09.6 mg/dL   GFR  calc non Af Amer 77 (*) >90 mL/min   GFR calc Af Amer 89 (*) >90 mL/min  CBC WITH DIFFERENTIAL      Component Value Range   WBC 8.1  4.0 - 10.5 K/uL   RBC 3.97  3.87 - 5.11 MIL/uL   Hemoglobin 11.8 (*) 12.0 - 15.0 g/dL   HCT 04.5 (*) 40.9 - 81.1 %   MCV 90.4  78.0 - 100.0 fL   MCH 29.7  26.0 - 34.0 pg   MCHC 32.9  30.0 - 36.0 g/dL   RDW 91.4  78.2 - 95.6 %   Platelets 206  150 - 400 K/uL   Neutrophils Relative 74  43 - 77 %   Neutro Abs 6.0  1.7 - 7.7 K/uL   Lymphocytes Relative 18  12 - 46 %   Lymphs Abs 1.5  0.7 - 4.0 K/uL   Monocytes Relative 7  3 - 12 %   Monocytes Absolute 0.6  0.1 - 1.0 K/uL   Eosinophils Relative 1  0 - 5 %   Eosinophils Absolute 0.1  0.0 - 0.7 K/uL   Basophils Relative 0  0 - 1 %   Basophils Absolute 0.0  0.0 - 0.1 K/uL  PRO B NATRIURETIC PEPTIDE      Component Value  Range   Pro B Natriuretic peptide (BNP) 666.7 (*) 0 - 450 pg/mL  TROPONIN I      Component Value Range   Troponin I <0.30  <0.30 ng/mL  URINALYSIS, ROUTINE W REFLEX MICROSCOPIC      Component Value Range   Color, Urine YELLOW  YELLOW   APPearance CLEAR  CLEAR   Specific Gravity, Urine 1.019  1.005 - 1.030   pH 6.0  5.0 - 8.0   Glucose, UA NEGATIVE  NEGATIVE mg/dL   Hgb urine dipstick NEGATIVE  NEGATIVE   Bilirubin Urine SMALL (*) NEGATIVE   Ketones, ur 15 (*) NEGATIVE mg/dL   Protein, ur NEGATIVE  NEGATIVE mg/dL   Urobilinogen, UA 1.0  0.0 - 1.0 mg/dL   Nitrite NEGATIVE  NEGATIVE   Leukocytes, UA NEGATIVE  NEGATIVE   Dg Chest 2 View 06/16/2012  *RADIOLOGY REPORT*  Clinical Data: Cough.  Rule out infiltrate.  History of CHF, free air.  CHEST - 2 VIEW  Comparison: 04/30/2012  Findings: Heart size is enlarged.  There are prominent interstitial markings bilaterally, likely chronic.  There is left basilar pleural thickening or small pleural effusion.  There are no focal consolidations.  No evidence for pulmonary edema.  IMPRESSION:  1.  Chronic pulmonary changes. 2.  No evidence for  pulmonary edema or new consolidation. 3.  Left pleural change.   Original Report Authenticated By: Norva Pavlov, M.D.     Results for TYRICA, AFZAL (MRN [id number]) as of 06/16/2012 15:12  Ref. Range 12/03/2008 21:26 12/04/2008 03:55 12/08/2008 10:21 04/21/2012 13:13 04/27/2012 06:15 06/16/2012 13:11  Pro B Natriuretic peptide (BNP) Latest Range: 0-450 pg/mL 140.0 (H) 367.0 (H) 338.0 (H) 6141.0 (H) 1311.0 (H) 666.7 (H)    Results for OUMOU, SMEAD (MRN [id number]) as of 06/16/2012 15:47  Ref. Range 05/05/2012 05:40 05/19/2012 11:01 06/16/2012 13:09  Hemoglobin Latest Range: 12.0-15.0 g/dL 86.5 (L) 78.4 69.6 (L)  HCT Latest Range: 36.0-46.0 % 35.9 (L) 41.6 35.9 (L)     1555:  Continues to deny CP while in the ED.  EPIC chart reviewed: no hx of TAA on multiple old CT chest studies.  No UTI on Udip to account for pt's LBP. Neuro exam non-focal. Will check US aorta for completeness of workup today.  TIMI 3.  ASA already given. T/C to [facility] Dr. Jannet Askew, case discussed, including:  HPI, pertinent PM/SHx, VS/PE, dx testing, ED course and treatment:  Agreeable to observation admit, requests to obtain tele bed to team 3.         Laray Anger, DO 06/16/12 1907

## 2012-06-16 NOTE — ED Notes (Signed)
Pt Iv infiltrated, IV fluids stopped. IV removed.

## 2012-06-16 NOTE — H&P (Signed)
[facility] History and Physical  [name] JWJ:[id number] DOB: April 16, 1930 DOA: 06/16/2012  Referring physician: Laray Anger, DO PCP: Roxy Manns, MD   Chief Complaint: Chest pain  HPI: [name] is a 78 y.o. female with past medical history of hypertension, DM 2, paroxysmal atrial fibrillation not on Coumadin because of prior GI bleed. Patient came in to the hospital because of chest pain. After patient woke up this morning, she was about to take her medication when she had substernal chest pain, was 10/10, she said she felt like a donkey hit her chest (crushing chest pain), this chest pain was associated with nausea and sweating but no vomiting. Said the chest pain lasted for 10-15 minutes and she came to the hospital for further evaluation. She denies any syncope, palpitations, shortness of breath or lower extremity swelling. Patient's been chest pain free since she's been in the hospital. First set of cardiac enzymes were negative, EKG showed atrial fibrillation.   Review of Systems:  Constitutional: negative for anorexia, fevers and sweats Eyes: negative for irritation, redness and visual disturbance Ears, nose, mouth, throat, and face: negative for earaches, epistaxis, nasal congestion and sore throat Respiratory: negative for cough, dyspnea on exertion, sputum and wheezing Cardiovascular: Per history of present illness, denies syncope or palpitations Gastrointestinal: negative for abdominal pain, constipation, diarrhea, melena, nausea and vomiting Genitourinary:negative for dysuria, frequency and hematuria Hematologic/lymphatic: negative for bleeding, easy bruising and lymphadenopathy Musculoskeletal:negative for arthralgias, muscle weakness and stiff joints Neurological: negative for coordination problems, gait problems, headaches and weakness Endocrine: negative for diabetic symptoms including polydipsia, polyuria and weight loss Allergic/Immunologic: negative  for anaphylaxis, hay fever and urticaria   Past Medical History  Diagnosis Date  . Allergy     allergic Rhintis  . Hyperlipidemia   . Hypertension   . Osteoporosis   . PAF (paroxysmal atrial fibrillation)     a. PAF dates back > 5 yrs, prev on coumadin->d/c 2006 2/2 GIB;   . Carotid stenosis     a. 09/2007: Carotid Doppler stable- stable bilateral stenosis 40-59% (from 06)  . Type II diabetes mellitus   . Tobacco abuse     a. ongoing - 50+ pack yr hx.  . Dementia     Dementia in Hospital //Does O.K. at home.  . Diverticulosis   . GI bleeding     a. when on coumadin in 2006 - colonoscopy @ that time showed 2 large right colon avm's, multiple small bowel avm's, fresh blood in prox small bowel.  . AVM (arteriovenous malformation) of colon     a. 2006 colonscopy  . Chronic respiratory failure   . COPD (chronic obstructive pulmonary disease)   . Chronic diastolic CHF (congestive heart failure)   . Normocytic anemia 03/2012   Past Surgical History  Procedure Date  . Cardiac catheterization 1990's    clear  . Orif tibia & fibula fractures 05/2004  . Small bowel enteroscopy 03/2005    AVMS  . Colonoscopy 08/2004  . Esophagogastroduodenoscopy 08/2004    Multiple    Social History:  reports that she quit smoking about 8 weeks ago. Her smoking use included Cigarettes. She has a 25 pack-year smoking history. She has never used smokeless tobacco. She reports that she does not drink alcohol or use illicit drugs. Lives with her son, she uses walker for ambulation.  Allergies  Allergen Reactions  . Azithromycin     REACTION: inc blood sugar  . Codeine Itching  . Coumadin (Warfarin)  Other (See Comments)    Causes bleeding  . Oxybutynin Chloride Er     Constipation/ dizziness/ dry mouth    Family History  Problem Relation Age of Onset  . Stroke Mother     a. believes she died suddenly @ 33.  . Diabetes Mother   . Stroke Father     Died from cerebral hemorrhage @ 80  . Diabetes  Father   . Cancer Sister     colon and stomach cancer  . Diabetes Brother   . Heart disease Brother   . Cancer Brother     died from lung cancer  . Heart disease Brother     MI  . Cancer Sister     ? liver//brain cancer  . Heart disease Sister   . Diabetes Sister     diabetes and glaucoma  . Cancer Brother     pancreatic cancer  . Diabetes Son     Prior to Admission medications   Medication Sig Start Date End Date Taking? Authorizing Provider  aspirin 81 MG tablet Take 81 mg by mouth daily.     Yes Historical Provider, MD  atorvastatin (LIPITOR) 10 MG tablet Take 10 mg by mouth daily.   Yes Judy Pimple, MD  Budesonide 90 MCG/ACT inhaler Inhale 2 puffs into the lungs 2 (two) times daily.   Yes Tammy S Parrett, NP  Cholecalciferol (VITAMIN D3) 2000 UNITS capsule Take 2,000 Units by mouth daily.    Yes Historical Provider, MD  digoxin (LANOXIN) 0.125 MG tablet Take 0.125 mg by mouth daily.   Yes Jessica A Hope, PA-C  diltiazem (CARDIZEM CD) 240 MG 24 hr capsule Take 240 mg by mouth daily. 06/15/12  Yes Judy Pimple, MD  fluticasone (FLONASE) 50 MCG/ACT nasal spray Place 2 sprays into the nose daily as needed. For allergies   Yes Judy Pimple, MD  furosemide (LASIX) 40 MG tablet Take 20-40 mg by mouth daily. 20 mg daily 40 mg if gains 3 lbs or more. 05/05/12  Yes Jessica A Hope, PA-C  ipratropium (ATROVENT HFA) 17 MCG/ACT inhaler Inhale 2 puffs into the lungs 4 (four) times daily.  06/16/12 Yes Jessica A Hope, PA-C  ipratropium (ATROVENT) 0.02 % nebulizer solution Take 500 mcg by nebulization 3 (three) times daily. 06/15/12  Yes Tammy S Parrett, NP  levalbuterol (XOPENEX) 0.63 MG/3ML nebulizer solution Take 1 ampule by nebulization 3 (three) times daily.   Yes Tammy S Parrett, NP  metFORMIN (GLUCOPHAGE) 500 MG tablet Take 500 mg by mouth 2 (two) times daily before a meal. 06/15/12  Yes Judy Pimple, MD  potassium chloride SA (K-DUR,KLOR-CON) 20 MEQ tablet Take 20 mEq by mouth daily.  06/15/12  Yes Marne A Tower, MD  glucose blood (ONE TOUCH ULTRA TEST) test strip 1 each by Other route as needed. Use as instructed Check blood sugar daily and as needed     Historical Provider, MD   Physical Exam: Filed Vitals:   06/16/12 1200 06/16/12 1230 06/16/12 1330 06/16/12 1530  BP: 150/76 142/84 155/84 160/74  Pulse: 96 77 75 90  Temp:      TempSrc:      Resp: 17 21 22 25   SpO2: 99% 99% 100% 99%   General appearance: alert, cooperative and no distress  Head: Normocephalic, without obvious abnormality, atraumatic  Eyes: conjunctivae/corneas clear. PERRL, EOM's intact. Fundi benign.  Nose: Nares normal. Septum midline. Mucosa normal. No drainage or sinus tenderness.  Throat: lips, mucosa, and tongue  normal; teeth and gums normal  Neck: Supple, no masses, no cervical lymphadenopathy, no JVD appreciated, no meningeal signs Resp: clear to auscultation bilaterally  Chest wall: no tenderness  Cardio: regular rate and rhythm, S1, S2 normal, no murmur, click, rub or gallop  GI: soft, non-tender; bowel sounds normal; no masses, no organomegaly  Extremities: extremities normal, atraumatic, no cyanosis or edema  Skin: Skin color, texture, turgor normal. No rashes or lesions  Neurologic: Alert and oriented X 3, normal strength and tone. Normal symmetric reflexes. Normal coordination and gait   Labs on Admission:  Basic Metabolic Panel:  Lab 06/16/12 1610 06/15/12 1258  NA 141 141  K 4.4 4.9  CL 101 100  CO2 30 30  GLUCOSE 175* 174*  BUN 15 18  CREATININE 0.78 0.9  CALCIUM 9.3 9.6  MG -- --  PHOS -- --   Liver Function Tests:  Lab 06/15/12 1258  AST 22  ALT 19  ALKPHOS 59  BILITOT 0.6  PROT 7.5  ALBUMIN 4.3   No results found for this basename: LIPASE:5,AMYLASE:5 in the last 168 hours No results found for this basename: AMMONIA:5 in the last 168 hours CBC:  Lab 06/16/12 1309  WBC 8.1  NEUTROABS 6.0  HGB 11.8*  HCT 35.9*  MCV 90.4  PLT 206   Cardiac  Enzymes:  Lab 06/16/12 1311  CKTOTAL --  CKMB --  CKMBINDEX --  TROPONINI <0.30    BNP (last 3 results)  Basename 06/16/12 1311 04/27/12 0615 04/21/12 1313  PROBNP 666.7* 1311.0* 6141.0*   CBG: No results found for this basename: GLUCAP:5 in the last 168 hours  Radiological Exams on Admission: Dg Chest 2 View  06/16/2012  *RADIOLOGY REPORT*  Clinical Data: Cough.  Rule out infiltrate.  History of CHF, free air.  CHEST - 2 VIEW  Comparison: 04/30/2012  Findings: Heart size is enlarged.  There are prominent interstitial markings bilaterally, likely chronic.  There is left basilar pleural thickening or small pleural effusion.  There are no focal consolidations.  No evidence for pulmonary edema.  IMPRESSION:  1.  Chronic pulmonary changes. 2.  No evidence for pulmonary edema or new consolidation. 3.  Left pleural change.   Original Report Authenticated By: Norva Pavlov, M.D.    US Aorta  06/16/2012  *RADIOLOGY REPORT*  Clinical Data:  Back pain  ULTRASOUND OF ABDOMINAL AORTA  Technique:  Ultrasound examination of the abdominal aorta was performed to evaluate for abdominal aortic aneurysm.  Comparison: None.  Abdominal Aorta:  No aneurysm identified.        Maximum AP diameter:  2.1 cm.       Maximum TRV diameter:  1.5 cm.  IMPRESSION: No abdominal aortic aneurysm identified.   Original Report Authenticated By: Charline Bills, M.D.     EKG: Independently reviewed.   Assessment/Plan Principal Problem:  *Chest pain Active Problems:  HYPERTENSION  Atrial fibrillation  DIABETES MELLITUS, TYPE II   Chest pain -No history of CAD, has history of hypertension, DM and dyslipidemia. -Atypical chest pain given that is resolved on its own, no clinical suspicion for [place]. -Rule out ACS, cycle 3 sets of troponins, repeat EKG in a.m. -Rule out other causes of chest pain, EDP already ordered a work take ultrasound, check D. dimers. -Started on full dose aspirin.  Diabetes mellitus type  2 -Check hemoglobin A1c, start patient on insulin sliding scale. -Carbohydrate modified diet, continue metformin.  Atrial fibrillation -Rate is controlled with Cardizem and digoxin, she is on low-dose  aspirin. -She was on Coumadin and that was discontinued in 2006 secondary to GI bleed.  Hypertension -She is on Cardizem, continue preadmission oral medications.  History of esophageal dysmotility -She saw Dr. Leone Payor yesterday for esophageal dysmotility, she had had dilatation before. -He recommended to start PPI, and avoid acidic food. -Doesn't seem this chest pain is related to her esophagus that was no swallowing involved.  Code Status: Full code Family Communication: Son and daughter-in-law were at bedside. Disposition Plan: Observation  Time spent: 70 minutes  [facility] Pager [phone]  If 7PM-7AM, please contact night-coverage [email or web address] Password [facility] 06/16/2012, 5:31 PM

## 2012-06-16 NOTE — ED Notes (Signed)
Pt from home  Via EMS, complaining of Back pain  Left side greater then right. 5/10, and chest pain 3/10.

## 2012-06-16 NOTE — Telephone Encounter (Signed)
[facility] for Lecretia with [facility]

## 2012-06-16 NOTE — ED Notes (Signed)
Patient transported to Ultrasound 

## 2012-06-16 NOTE — ED Notes (Signed)
Patient transported to X-ray 

## 2012-06-17 ENCOUNTER — Inpatient Hospital Stay (HOSPITAL_COMMUNITY): Payer: Medicare Other

## 2012-06-17 ENCOUNTER — Observation Stay (HOSPITAL_COMMUNITY): Payer: Medicare Other

## 2012-06-17 ENCOUNTER — Encounter (HOSPITAL_COMMUNITY): Payer: Self-pay | Admitting: General Practice

## 2012-06-17 DIAGNOSIS — Z86711 Personal history of pulmonary embolism: Secondary | ICD-10-CM

## 2012-06-17 DIAGNOSIS — I2699 Other pulmonary embolism without acute cor pulmonale: Principal | ICD-10-CM

## 2012-06-17 DIAGNOSIS — J449 Chronic obstructive pulmonary disease, unspecified: Secondary | ICD-10-CM

## 2012-06-17 LAB — CBC
HCT: 36 % (ref 36.0–46.0)
Hemoglobin: 11.7 g/dL — ABNORMAL LOW (ref 12.0–15.0)
MCH: 29.6 pg (ref 26.0–34.0)
MCHC: 32.5 g/dL (ref 30.0–36.0)
RBC: 3.95 MIL/uL (ref 3.87–5.11)

## 2012-06-17 LAB — COMPREHENSIVE METABOLIC PANEL
ALT: 15 U/L (ref 0–35)
AST: 13 U/L (ref 0–37)
CO2: 30 mEq/L (ref 19–32)
Calcium: 9.4 mg/dL (ref 8.4–10.5)
Creatinine, Ser: 0.93 mg/dL (ref 0.50–1.10)
GFR calc non Af Amer: 57 mL/min — ABNORMAL LOW (ref >90)
Sodium: 140 mEq/L (ref 135–145)
Total Protein: 6.1 g/dL (ref 6.0–8.3)

## 2012-06-17 LAB — HEMOGLOBIN A1C
Hgb A1c MFr Bld: 7.4 % — ABNORMAL HIGH (ref <5.7)
Mean Plasma Glucose: 166 mg/dL — ABNORMAL HIGH (ref <117)

## 2012-06-17 LAB — URINE CULTURE: Culture: NO GROWTH

## 2012-06-17 LAB — TROPONIN I
Troponin I: <0.3 ng/mL (ref <0.30)
Troponin I: <0.3 ng/mL (ref <0.30)

## 2012-06-17 LAB — LIPID PANEL
HDL: 43 mg/dL (ref >39)
LDL Cholesterol: 51 mg/dL (ref 0–99)
Total CHOL/HDL Ratio: 3 RATIO
Triglycerides: 180 mg/dL — ABNORMAL HIGH (ref <150)

## 2012-06-17 LAB — GLUCOSE, CAPILLARY: Glucose-Capillary: 148 mg/dL — ABNORMAL HIGH (ref 70–99)

## 2012-06-17 MED ORDER — IOHEXOL 300 MG/ML  SOLN
150.0000 mL | Freq: Once | INTRAMUSCULAR | Status: AC | PRN
  Administered 2012-06-17: 50 mL via INTRAVENOUS

## 2012-06-17 MED ORDER — MIDAZOLAM HCL 2 MG/2ML IJ SOLN
INTRAMUSCULAR | Status: AC | PRN
  Administered 2012-06-17 (×2): 2 mg via INTRAVENOUS

## 2012-06-17 MED ORDER — IOHEXOL 350 MG/ML SOLN
100.0000 mL | Freq: Once | INTRAVENOUS | Status: AC | PRN
  Administered 2012-06-17: 100 mL via INTRAVENOUS

## 2012-06-17 MED ORDER — FENTANYL CITRATE 0.05 MG/ML IJ SOLN
INTRAMUSCULAR | Status: AC
  Filled 2012-06-17: qty 4

## 2012-06-17 MED ORDER — MIDAZOLAM HCL 2 MG/2ML IJ SOLN
INTRAMUSCULAR | Status: AC
  Filled 2012-06-17: qty 4

## 2012-06-17 NOTE — Telephone Encounter (Signed)
Spoke with [name] that pt was at [facility] and then released; son was aware that CPAP was covered as soon as sleep study is done (kept current) and shows needs for CPAP.

## 2012-06-17 NOTE — Procedures (Signed)
Placement of IVC filter.  No immediate complication. See dictated report.

## 2012-06-17 NOTE — Telephone Encounter (Signed)
LMTCB-find out from [place] whats going on with this matter.

## 2012-06-17 NOTE — Progress Notes (Signed)
Utilization review completed.  

## 2012-06-17 NOTE — Telephone Encounter (Signed)
Will sign off on note and await for sleep study results.

## 2012-06-17 NOTE — H&P (Signed)
Patient does her own consent according to the patient's son.  Patient was explained the procedure and signed informed consent.

## 2012-06-17 NOTE — H&P (Signed)
[name] is an 77 y.o. female.   Chief Complaint: patient with PE - for IVC filter placement HPI: Hx of A-fib, not candidate for long term anticoagulation secondary to GI bleed - now with PE.  In need of filter for long term protection. This would be a permanent filter.   Past Medical History  Diagnosis Date  . Allergy     allergic Rhintis  . Hyperlipidemia   . Hypertension   . Osteoporosis   . PAF (paroxysmal atrial fibrillation)     a. PAF dates back > 5 yrs, prev on coumadin->d/c 2006 2/2 GIB;   . Carotid stenosis     a. 09/2007: Carotid Doppler stable- stable bilateral stenosis 40-59% (from 06)  . Type II diabetes mellitus   . Tobacco abuse     a. ongoing - 50+ pack yr hx.  . Dementia     Dementia in Hospital //Does O.K. at home.  . Diverticulosis   . GI bleeding     a. when on coumadin in 2006 - colonoscopy @ that time showed 2 large right colon avm's, multiple small bowel avm's, fresh blood in prox small bowel.  . AVM (arteriovenous malformation) of colon     a. 2006 colonscopy  . Chronic respiratory failure   . COPD (chronic obstructive pulmonary disease)   . Chronic diastolic CHF (congestive heart failure)   . Normocytic anemia 03/2012  . Complication of anesthesia   . PONV (postoperative nausea and vomiting)   . Shortness of breath   . Pneumonia     MULTIPLE TIMES"  . Heart murmur   . GERD (gastroesophageal reflux disease)     Past Surgical History  Procedure Date  . Cardiac catheterization 1990's    clear  . Orif tibia & fibula fractures 05/2004  . Small bowel enteroscopy 03/2005    AVMS  . Colonoscopy 08/2004  . Esophagogastroduodenoscopy 08/2004    Multiple     Family History  Problem Relation Age of Onset  . Stroke Mother     a. believes she died suddenly @ 16.  . Diabetes Mother   . Stroke Father     Died from cerebral hemorrhage @ 65  . Diabetes Father   . Cancer Sister     colon and stomach cancer  . Diabetes Brother   . Heart disease  Brother   . Cancer Brother     died from lung cancer  . Heart disease Brother     MI  . Cancer Sister     ? liver//brain cancer  . Heart disease Sister   . Diabetes Sister     diabetes and glaucoma  . Cancer Brother     pancreatic cancer  . Diabetes Son    Social History:  reports that she quit smoking about 8 weeks ago. Her smoking use included Cigarettes. She has a 25 pack-year smoking history. She has never used smokeless tobacco. She reports that she does not drink alcohol or use illicit drugs.  Allergies:  Allergies  Allergen Reactions  . Azithromycin     REACTION: inc blood sugar  . Codeine Itching  . Coumadin (Warfarin) Other (See Comments)    Causes bleeding  . Oxybutynin Chloride Er     Constipation/ dizziness/ dry mouth    Medications Prior to Admission  Medication Sig Dispense Refill  . aspirin 81 MG tablet Take 81 mg by mouth daily.        [name] atorvastatin (LIPITOR) 10 MG tablet Take  10 mg by mouth daily.      . Budesonide 90 MCG/ACT inhaler Inhale 2 puffs into the lungs 2 (two) times daily.      . Cholecalciferol (VITAMIN D3) 2000 UNITS capsule Take 2,000 Units by mouth daily.       . digoxin (LANOXIN) 0.125 MG tablet Take 0.125 mg by mouth daily.      [name] diltiazem (CARDIZEM CD) 240 MG 24 hr capsule Take 240 mg by mouth daily.      . fluticasone (FLONASE) 50 MCG/ACT nasal spray Place 2 sprays into the nose daily as needed. For allergies      . furosemide (LASIX) 40 MG tablet Take 20-40 mg by mouth daily. 20 mg daily 40 mg if gains 3 lbs or more.      . [EXPIRED] ipratropium (ATROVENT HFA) 17 MCG/ACT inhaler Inhale 2 puffs into the lungs 4 (four) times daily.      [name] ipratropium (ATROVENT) 0.02 % nebulizer solution Take 500 mcg by nebulization 3 (three) times daily.      [name] levalbuterol (XOPENEX) 0.63 MG/3ML nebulizer solution Take 1 ampule by nebulization 3 (three) times daily.      . metFORMIN (GLUCOPHAGE) 500 MG tablet Take 500 mg by mouth 2 (two) times daily before  a meal.      . potassium chloride SA (K-DUR,KLOR-CON) 20 MEQ tablet Take 20 mEq by mouth daily.      [name] glucose blood (ONE TOUCH ULTRA TEST) test strip 1 each by Other route as needed. Use as instructed Check blood sugar daily and as needed         Results for orders placed during the hospital encounter of 06/16/12 (from the past 48 hour(s))  BASIC METABOLIC PANEL     Status: Abnormal   Collection Time   06/16/12  1:09 PM      Component Value Range Comment   Sodium 141  135 - 145 mEq/L    Potassium 4.4  3.5 - 5.1 mEq/L    Chloride 101  96 - 112 mEq/L    CO2 30  19 - 32 mEq/L    Glucose, Bld 175 (*) 70 - 99 mg/dL    BUN 15  6 - 23 mg/dL    Creatinine, Ser 1.61  0.50 - 1.10 mg/dL    Calcium 9.3  8.4 - 09.6 mg/dL    GFR calc non Af Amer 77 (*) >90 mL/min    GFR calc Af Amer 89 (*) >90 mL/min   CBC WITH DIFFERENTIAL     Status: Abnormal   Collection Time   06/16/12  1:09 PM      Component Value Range Comment   WBC 8.1  4.0 - 10.5 K/uL    RBC 3.97  3.87 - 5.11 MIL/uL    Hemoglobin 11.8 (*) 12.0 - 15.0 g/dL    HCT 04.5 (*) 40.9 - 46.0 %    MCV 90.4  78.0 - 100.0 fL    MCH 29.7  26.0 - 34.0 pg    MCHC 32.9  30.0 - 36.0 g/dL    RDW 81.1  91.4 - 78.2 %    Platelets 206  150 - 400 K/uL    Neutrophils Relative 74  43 - 77 %    Neutro Abs 6.0  1.7 - 7.7 K/uL    Lymphocytes Relative 18  12 - 46 %    Lymphs Abs 1.5  0.7 - 4.0 K/uL    Monocytes Relative 7  3 - 12 %  Monocytes Absolute 0.6  0.1 - 1.0 K/uL    Eosinophils Relative 1  0 - 5 %    Eosinophils Absolute 0.1  0.0 - 0.7 K/uL    Basophils Relative 0  0 - 1 %    Basophils Absolute 0.0  0.0 - 0.1 K/uL   PRO B NATRIURETIC PEPTIDE     Status: Abnormal   Collection Time   06/16/12  1:11 PM      Component Value Range Comment   Pro B Natriuretic peptide (BNP) 666.7 (*) 0 - 450 pg/mL   TROPONIN I     Status: Normal   Collection Time   06/16/12  1:11 PM      Component Value Range Comment   Troponin I <0.30  <0.30 ng/mL    URINALYSIS, ROUTINE W REFLEX MICROSCOPIC     Status: Abnormal   Collection Time   06/16/12  3:03 PM      Component Value Range Comment   Color, Urine YELLOW  YELLOW    APPearance CLEAR  CLEAR    Specific Gravity, Urine 1.019  1.005 - 1.030    pH 6.0  5.0 - 8.0    Glucose, UA NEGATIVE  NEGATIVE mg/dL    Hgb urine dipstick NEGATIVE  NEGATIVE    Bilirubin Urine SMALL (*) NEGATIVE    Ketones, ur 15 (*) NEGATIVE mg/dL    Protein, ur NEGATIVE  NEGATIVE mg/dL    Urobilinogen, UA 1.0  0.0 - 1.0 mg/dL    Nitrite NEGATIVE  NEGATIVE    Leukocytes, UA NEGATIVE  NEGATIVE MICROSCOPIC NOT DONE ON URINES WITH NEGATIVE PROTEIN, BLOOD, LEUKOCYTES, NITRITE, OR GLUCOSE <1000 mg/dL.  DIGOXIN LEVEL     Status: Normal   Collection Time   06/16/12  5:50 PM      Component Value Range Comment   Digoxin Level 0.9  0.8 - 2.0 ng/mL   TSH     Status: Normal   Collection Time   06/16/12  7:51 PM      Component Value Range Comment   TSH 1.425  0.350 - 4.500 uIU/mL   TROPONIN I     Status: Normal   Collection Time   06/16/12  7:51 PM      Component Value Range Comment   Troponin I <0.30  <0.30 ng/mL   HEMOGLOBIN A1C     Status: Abnormal   Collection Time   06/16/12  7:51 PM      Component Value Range Comment   Hemoglobin A1C 7.4 (*) <5.7 %    Mean Plasma Glucose 166 (*) <117 mg/dL   D-DIMER, QUANTITATIVE     Status: Abnormal   Collection Time   06/16/12  7:51 PM      Component Value Range Comment   D-Dimer, Quant 0.75 (*) 0.00 - 0.48 ug/mL-FEU   GLUCOSE, CAPILLARY     Status: Abnormal   Collection Time   06/16/12  9:51 PM      Component Value Range Comment   Glucose-Capillary 224 (*) 70 - 99 mg/dL   TROPONIN I     Status: Normal   Collection Time   06/17/12  1:56 AM      Component Value Range Comment   Troponin I <0.30  <0.30 ng/mL   COMPREHENSIVE METABOLIC PANEL     Status: Abnormal   Collection Time   06/17/12  1:56 AM      Component Value Range Comment   Sodium 140  135 - 145 mEq/L  Potassium 4.2  3.5 - 5.1 mEq/L    Chloride 101  96 - 112 mEq/L    CO2 30  19 - 32 mEq/L    Glucose, Bld 240 (*) 70 - 99 mg/dL    BUN 17  6 - 23 mg/dL    Creatinine, Ser 4.09  0.50 - 1.10 mg/dL    Calcium 9.4  8.4 - 81.1 mg/dL    Total Protein 6.1  6.0 - 8.3 g/dL    Albumin 3.3 (*) 3.5 - 5.2 g/dL    AST 13  0 - 37 U/L    ALT 15  0 - 35 U/L    Alkaline Phosphatase 58  39 - 117 U/L    Total Bilirubin 0.2 (*) 0.3 - 1.2 mg/dL    GFR calc non Af Amer 57 (*) >90 mL/min    GFR calc Af Amer 66 (*) >90 mL/min   CBC     Status: Abnormal   Collection Time   06/17/12  1:56 AM      Component Value Range Comment   WBC 7.7  4.0 - 10.5 K/uL    RBC 3.95  3.87 - 5.11 MIL/uL    Hemoglobin 11.7 (*) 12.0 - 15.0 g/dL    HCT 91.4  78.2 - 95.6 %    MCV 91.1  78.0 - 100.0 fL    MCH 29.6  26.0 - 34.0 pg    MCHC 32.5  30.0 - 36.0 g/dL    RDW 21.3  08.6 - 57.8 %    Platelets 200  150 - 400 K/uL   LIPID PANEL     Status: Abnormal   Collection Time   06/17/12  1:56 AM      Component Value Range Comment   Cholesterol 130  0 - 200 mg/dL    Triglycerides 469 (*) <150 mg/dL    HDL 43  >62 mg/dL    Total CHOL/HDL Ratio 3.0      VLDL 36  0 - 40 mg/dL    LDL Cholesterol 51  0 - 99 mg/dL   GLUCOSE, CAPILLARY     Status: Abnormal   Collection Time   06/17/12  7:38 AM      Component Value Range Comment   Glucose-Capillary 148 (*) 70 - 99 mg/dL    Comment 1 Notify RN     TROPONIN I     Status: Normal   Collection Time   06/17/12  8:45 AM      Component Value Range Comment   Troponin I <0.30  <0.30 ng/mL    Dg Chest 2 View  06/16/2012  *RADIOLOGY REPORT*  Clinical Data: Cough.  Rule out infiltrate.  History of CHF, free air.  CHEST - 2 VIEW  Comparison: 04/30/2012  Findings: Heart size is enlarged.  There are prominent interstitial markings bilaterally, likely chronic.  There is left basilar pleural thickening or small pleural effusion.  There are no focal consolidations.  No evidence for pulmonary edema.   IMPRESSION:  1.  Chronic pulmonary changes. 2.  No evidence for pulmonary edema or new consolidation. 3.  Left pleural change.   Original Report Authenticated By: Norva Pavlov, M.D.    Ct Angio Chest Pe W/cm &/or Wo Cm  06/17/2012  *RADIOLOGY REPORT*  Clinical Data: Chest pain, elevated D-dimer.  CT ANGIOGRAPHY CHEST  Technique:  Multidetector CT imaging of the chest using the standard protocol during bolus administration of intravenous contrast. Multiplanar reconstructed images including MIPs were obtained and reviewed  to evaluate the vascular anatomy.  Contrast: OMNIPAQUE IOHEXOL 350 MG/ML SOLN  Comparison: 06/16/2012 radiograph, 12/08/2008 CT  Findings: Pulmonary arterial branch filling defects to right middle and lower lobe segmental branches.  No overt evidence for right ventricular heart strain.  The heart is mildly enlarged.  Coronary artery calcification. Aortic valve calcification.  No pleural or pericardial effusion. No intrathoracic lymphadenopathy.  Central airways are patent.  Bilateral lower lobe airspace opacities.  Scattered bullae and centrolobular emphysematous changes.  Scattered small nodular opacities (for example right upper lobe laterally on image 32 series 5 measuring 7 mm.  Small hiatal hernia.  Limited images through the upper abdomen show no acute finding.  Osteopenia.  No acute osseous finding.  IMPRESSION: Right middle and lower lobe pulmonary emboli. No CT evidence for right ventricular heart strain.  Cardiomegaly with coronary artery calcification.  Centrolobular emphysematous changes.  Bilateral posterior consolidations and scattered nodular opacities measuring up to 7 mm. May be infectious/inflammatory.   Recommend a short-term follow-up chest CT in 2-3 months to document resolution.  Critical Value/emergent results were called by telephone at the time of interpretation on 06/17/2012 at 07:00 a.m. to [facility], who verbally acknowledged these results.   Original  Report Authenticated By: Jearld Lesch, M.D.    US Aorta  06/16/2012  *RADIOLOGY REPORT*  Clinical Data:  Back pain  ULTRASOUND OF ABDOMINAL AORTA  Technique:  Ultrasound examination of the abdominal aorta was performed to evaluate for abdominal aortic aneurysm.  Comparison: None.  Abdominal Aorta:  No aneurysm identified.        Maximum AP diameter:  2.1 cm.       Maximum TRV diameter:  1.5 cm.  IMPRESSION: No abdominal aortic aneurysm identified.   Original Report Authenticated By: Charline Bills, M.D.     Review of Systems  Constitutional: Negative for fever and chills.  Cardiovascular: Positive for chest pain and orthopnea.  Gastrointestinal: Positive for nausea, abdominal pain and blood in stool.       Hx of GI AVM  Endo/Heme/Allergies: Bruises/bleeds easily.    Blood pressure 125/71, pulse 72, temperature 98 F (36.7 C), temperature source Oral, resp. rate 18, SpO2 98.00%. Physical Exam  Constitutional: She is oriented to person, place, and time. She appears well-developed and well-nourished. No distress.  HENT:  Head: Normocephalic and atraumatic.  Eyes: Pupils are equal, round, and reactive to light.  Cardiovascular:       IR, IR   Respiratory: Effort normal. No respiratory distress. She has no wheezes. She has no rales.       Left rhonchi   GI: Soft. Bowel sounds are normal.  Neurological: She is alert and oriented to person, place, and time.  Skin: Skin is warm and dry. She is not diaphoretic.  Psychiatric: She has a normal mood and affect. Her behavior is normal.     Assessment/Plan Patient's history has been reviewed and patient is an appropriate candidate for IVC filter placement. Benefits and risks reviewed with patient with her apparent understanding and she asks all appropriate questions. Son, Rachael Fee however has POA - has talked with patient's MD about this procedure per the patient. He is not available at this time for me to personally talk with . He is to  return to hospital later today - plan to further discuss with him in the event he has any further questions prior to signing permit. Labs WNL to proceed.   CAMPBELL,PAMELA D 06/17/2012, 11:30 AM

## 2012-06-17 NOTE — Progress Notes (Signed)
Pt d-dimer elevated at 0.75. Pt denies any SOB. NP on call made aware. Will cont to monitor pt.

## 2012-06-17 NOTE — Progress Notes (Signed)
[facility]             Progress Note   Subjective: Feels much better. CP has resolved.  Objective: Vital signs in last 24 hours: Temp:  [97.9 F (36.6 C)-98.4 F (36.9 C)] 98 F (36.7 C) (12/18 0500) Pulse Rate:  [75-96] 90  (12/17 1530) Resp:  [17-25] 18  (12/18 0500) BP: (106-166)/(63-87) 106/63 mmHg (12/18 0500) SpO2:  [92 %-100 %] 98 % (12/18 0818) Weight change:  Last BM Date: 06/16/12  Intake/Output from previous day: 12/17 0701 - 12/18 0700 In: 1675 [I.V.:1675] Out: -      Physical Exam: General: Alert, awake, oriented x3, in no acute distress. HEENT: No bruits, no goiter. Heart: Regular rate and rhythm, without murmurs, rubs, gallops. Lungs: Clear to auscultation bilaterally. Abdomen: Soft, nontender, nondistended, positive bowel sounds. Extremities: No clubbing cyanosis or edema with positive pedal pulses. Neuro: Grossly intact, nonfocal.    Lab Results: Basic Metabolic Panel:  Basename 06/17/12 0156 06/16/12 1309  NA 140 141  K 4.2 4.4  CL 101 101  CO2 30 30  GLUCOSE 240* 175*  BUN 17 15  CREATININE 0.93 0.78  CALCIUM 9.4 9.3  MG -- --  PHOS -- --   Liver Function Tests:  [facility] 06/17/12 0156 06/15/12 1258  AST 13 22  ALT 15 19  ALKPHOS 58 59  BILITOT 0.2* 0.6  PROT 6.1 7.5  ALBUMIN 3.3* 4.3   CBC:  Basename 06/17/12 0156 06/16/12 1309  WBC 7.7 8.1  NEUTROABS -- 6.0  HGB 11.7* 11.8*  HCT 36.0 35.9*  MCV 91.1 90.4  PLT 200 206   Cardiac Enzymes:  Basename 06/17/12 0156 06/16/12 1951 06/16/12 1311  CKTOTAL -- -- --  CKMB -- -- --  CKMBINDEX -- -- --  TROPONINI <0.30 <0.30 <0.30   BNP:  Basename 06/16/12 1311  PROBNP 666.7*   D-Dimer:  Basename 06/16/12 1951  DDIMER 0.75*   CBG:  Basename 06/17/12 0738 06/16/12 2151  GLUCAP 148* 224*   Hemoglobin A1C:  Basename 06/16/12 1951  HGBA1C 7.4*   Fasting Lipid Panel:  Basename 06/17/12 0156  CHOL 130  HDL 43  LDLCALC 51  TRIG 180*  CHOLHDL  3.0  LDLDIRECT --   Thyroid Function Tests:  Basename 06/16/12 1951  TSH 1.425  T4TOTAL --  FREET4 --  T3FREE --  THYROIDAB --   Urinalysis:  Basename 06/16/12 1503  COLORURINE YELLOW  LABSPEC 1.019  PHURINE 6.0  GLUCOSEU NEGATIVE  HGBUR NEGATIVE  BILIRUBINUR SMALL*  KETONESUR 15*  PROTEINUR NEGATIVE  UROBILINOGEN 1.0  NITRITE NEGATIVE  LEUKOCYTESUR NEGATIVE    Studies/Results: Dg Chest 2 View  06/16/2012  *RADIOLOGY REPORT*  Clinical Data: Cough.  Rule out infiltrate.  History of CHF, free air.  CHEST - 2 VIEW  Comparison: 04/30/2012  Findings: Heart size is enlarged.  There are prominent interstitial markings bilaterally, likely chronic.  There is left basilar pleural thickening or small pleural effusion.  There are no focal consolidations.  No evidence for pulmonary edema.  IMPRESSION:  1.  Chronic pulmonary changes. 2.  No evidence for pulmonary edema or new consolidation. 3.  Left pleural change.   Original Report Authenticated By: Norva Pavlov, M.D.    Ct Angio Chest Pe W/cm &/or Wo Cm  06/17/2012  *RADIOLOGY REPORT*  Clinical Data: Chest pain, elevated D-dimer.  CT ANGIOGRAPHY CHEST  Technique:  Multidetector CT imaging of the chest using the standard protocol during bolus administration of intravenous contrast. Multiplanar reconstructed images  including MIPs were obtained and reviewed to evaluate the vascular anatomy.  Contrast: OMNIPAQUE IOHEXOL 350 MG/ML SOLN  Comparison: 06/16/2012 radiograph, 12/08/2008 CT  Findings: Pulmonary arterial branch filling defects to right middle and lower lobe segmental branches.  No overt evidence for right ventricular heart strain.  The heart is mildly enlarged.  Coronary artery calcification. Aortic valve calcification.  No pleural or pericardial effusion. No intrathoracic lymphadenopathy.  Central airways are patent.  Bilateral lower lobe airspace opacities.  Scattered bullae and centrolobular emphysematous changes.  Scattered  small nodular opacities (for example right upper lobe laterally on image 32 series 5 measuring 7 mm.  Small hiatal hernia.  Limited images through the upper abdomen show no acute finding.  Osteopenia.  No acute osseous finding.  IMPRESSION: Right middle and lower lobe pulmonary emboli. No CT evidence for right ventricular heart strain.  Cardiomegaly with coronary artery calcification.  Centrolobular emphysematous changes.  Bilateral posterior consolidations and scattered nodular opacities measuring up to 7 mm. May be infectious/inflammatory.   Recommend a short-term follow-up chest CT in 2-3 months to document resolution.  Critical Value/emergent results were called by telephone at the time of interpretation on 06/17/2012 at 07:00 a.m. to [facility], who verbally acknowledged these results.   Original Report Authenticated By: Jearld Lesch, M.D.    US Aorta  06/16/2012  *RADIOLOGY REPORT*  Clinical Data:  Back pain  ULTRASOUND OF ABDOMINAL AORTA  Technique:  Ultrasound examination of the abdominal aorta was performed to evaluate for abdominal aortic aneurysm.  Comparison: None.  Abdominal Aorta:  No aneurysm identified.        Maximum AP diameter:  2.1 cm.       Maximum TRV diameter:  1.5 cm.  IMPRESSION: No abdominal aortic aneurysm identified.   Original Report Authenticated By: Charline Bills, M.D.     Medications: Scheduled Meds:   . aspirin EC  325 mg Oral Daily  . atorvastatin  10 mg Oral Daily  . digoxin  0.125 mg Oral Daily  . diltiazem  240 mg Oral Daily  . fluticasone  2 puff Inhalation BID  . insulin aspart  0-9 Units Subcutaneous TID WC  . ipratropium  500 mcg Nebulization QID  . pantoprazole  40 mg Oral Daily  . potassium chloride SA  20 mEq Oral Daily  . sodium chloride  3 mL Intravenous Q12H   Continuous Infusions:   . sodium chloride 75 mL/hr at 06/16/12 2129   PRN Meds:.acetaminophen, acetaminophen, HYDROcodone-acetaminophen, levalbuterol, morphine injection,  nitroGLYCERIN, ondansetron (ZOFRAN) IV, ondansetron  Assessment/Plan:  Principal Problem:  *PE (pulmonary embolism) Active Problems:  HYPERTENSION  Atrial fibrillation  DIABETES MELLITUS, TYPE II  Chest pain   PE -This is the cause of her chest pain. -She has been deemed to not be a suitable coumadin candidate in the past due to severe GI Bleeds. -Have discussed with patient and son, and we have decided the best course of action would be to place an IVC filter.  Chest Pain -2/2 PE. -So far cardiac enzymes have been negative. -EKG without acute ischemic changes.  HTN -Well controlled.  DM-II -DC metformin. -Continue SSI. -Fair CBG control. -Adjust as needed.  Disposition -For IVC filter placement.   Time spent coordinating care: 35 minutes   LOS: 1 day   [facility] Pager: [phone] 06/17/2012, 8:41 AM

## 2012-06-17 NOTE — Progress Notes (Signed)
Inpatient Diabetes Program Recommendations  AACE/ADA: New Consensus Statement on Inpatient Glycemic Control (2013)  Target Ranges:  Prepandial:   less than 140 mg/dL      Peak postprandial:   less than 180 mg/dL (1-2 hours)      Critically ill patients:  140 - 180 mg/dL   Reason for Visit: Results for CAMERYN, SCHUM (MRN [id number]) as of 06/17/2012 12:29  Ref. Range 06/16/2012 21:51 06/17/2012 07:38 06/17/2012 11:46  Glucose-Capillary Latest Range: 70-99 mg/dL 425 (H) 956 (H) 387 (H)   Please change diet to CHO modified.  Also may consider adding Novolog meal coverage 3 units tid with meals to cover CHO intake.

## 2012-06-18 ENCOUNTER — Ambulatory Visit: Payer: Medicare Other | Admitting: Cardiology

## 2012-06-18 DIAGNOSIS — J9620 Acute and chronic respiratory failure, unspecified whether with hypoxia or hypercapnia: Secondary | ICD-10-CM

## 2012-06-18 LAB — CBC
MCH: 30 pg (ref 26.0–34.0)
MCV: 90.1 fL (ref 78.0–100.0)
Platelets: 200 10*3/uL (ref 150–400)
RDW: 13 % (ref 11.5–15.5)

## 2012-06-18 LAB — GLUCOSE, CAPILLARY
Glucose-Capillary: 235 mg/dL — ABNORMAL HIGH (ref 70–99)
Glucose-Capillary: 225 mg/dL — ABNORMAL HIGH (ref 70–99)

## 2012-06-18 LAB — BASIC METABOLIC PANEL
CO2: 31 mEq/L (ref 19–32)
Calcium: 9.5 mg/dL (ref 8.4–10.5)
Creatinine, Ser: 0.73 mg/dL (ref 0.50–1.10)
Glucose, Bld: 164 mg/dL — ABNORMAL HIGH (ref 70–99)

## 2012-06-18 MED ORDER — BIOTENE DRY MOUTH MT LIQD
15.0000 mL | Freq: Two times a day (BID) | OROMUCOSAL | Status: DC
  Administered 2012-06-18: 15 mL via OROMUCOSAL

## 2012-06-18 NOTE — Discharge Summary (Signed)
Physician Discharge Summary  Patient ID: [name] MRN: [id number] DOB/AGE: March 12, 1930 78 y.o.  Admit date: 06/16/2012 Discharge date: 06/18/2012  Primary Care Physician:  Roxy Manns, MD   Discharge Diagnoses:    Principal Problem:  *PE (pulmonary embolism) Active Problems:  HYPERTENSION  Atrial fibrillation  DIABETES MELLITUS, TYPE II  Chest pain      Medication List     As of 06/18/2012  8:38 AM    STOP taking these medications         ipratropium 17 MCG/ACT inhaler   Commonly known as: ATROVENT HFA      TAKE these medications         aspirin 81 MG tablet   Take 81 mg by mouth daily.      atorvastatin 10 MG tablet   Commonly known as: LIPITOR   Take 10 mg by mouth daily.      Budesonide 90 MCG/ACT inhaler   Inhale 2 puffs into the lungs 2 (two) times daily.      digoxin 0.125 MG tablet   Commonly known as: LANOXIN   Take 0.125 mg by mouth daily.      diltiazem 240 MG 24 hr capsule   Commonly known as: CARDIZEM CD   Take 240 mg by mouth daily.      fluticasone 50 MCG/ACT nasal spray   Commonly known as: FLONASE   Place 2 sprays into the nose daily as needed. For allergies      furosemide 40 MG tablet   Commonly known as: LASIX   Take 20-40 mg by mouth daily. 20 mg daily 40 mg if gains 3 lbs or more.      ipratropium 0.02 % nebulizer solution   Commonly known as: ATROVENT   Take 500 mcg by nebulization 3 (three) times daily.      levalbuterol 0.63 MG/3ML nebulizer solution   Commonly known as: XOPENEX   Take 1 ampule by nebulization 3 (three) times daily.      metFORMIN 500 MG tablet   Commonly known as: GLUCOPHAGE   Take 500 mg by mouth 2 (two) times daily before a meal.      ONE TOUCH ULTRA TEST test strip   Generic drug: glucose blood   1 each by Other route as needed. Use as instructed  Check blood sugar daily and as needed      potassium chloride SA 20 MEQ tablet   Commonly known as: K-DUR,KLOR-CON   Take 20 mEq by mouth  daily.      Vitamin D3 2000 UNITS capsule   Take 2,000 Units by mouth daily.         Disposition and Follow-up:  Will be discharged home today in stable and improved condition. Has been instructed to follow up with her PCP in 2 weeks. We will see if she qualifies for home oxygen, and if she does, her PCP will need to assess at follow up continued oxygen necessity.  Consults:  Interventional Radiology, Dr. Lowella Dandy.   Significant Diagnostic Studies:  Dg Chest 2 View  06/16/2012  *RADIOLOGY REPORT*  Clinical Data: Cough.  Rule out infiltrate.  History of CHF, free air.  CHEST - 2 VIEW  Comparison: 04/30/2012  Findings: Heart size is enlarged.  There are prominent interstitial markings bilaterally, likely chronic.  There is left basilar pleural thickening or small pleural effusion.  There are no focal consolidations.  No evidence for pulmonary edema.  IMPRESSION:  1.  Chronic pulmonary changes. 2.  No evidence for pulmonary edema or new consolidation. 3.  Left pleural change.   Original Report Authenticated By: Norva Pavlov, M.D.    Ct Angio Chest Pe W/cm &/or Wo Cm  06/17/2012  *RADIOLOGY REPORT*  Clinical Data: Chest pain, elevated D-dimer.  CT ANGIOGRAPHY CHEST  Technique:  Multidetector CT imaging of the chest using the standard protocol during bolus administration of intravenous contrast. Multiplanar reconstructed images including MIPs were obtained and reviewed to evaluate the vascular anatomy.  Contrast: OMNIPAQUE IOHEXOL 350 MG/ML SOLN  Comparison: 06/16/2012 radiograph, 12/08/2008 CT  Findings: Pulmonary arterial branch filling defects to right middle and lower lobe segmental branches.  No overt evidence for right ventricular heart strain.  The heart is mildly enlarged.  Coronary artery calcification. Aortic valve calcification.  No pleural or pericardial effusion. No intrathoracic lymphadenopathy.  Central airways are patent.  Bilateral lower lobe airspace opacities.  Scattered  bullae and centrolobular emphysematous changes.  Scattered small nodular opacities (for example right upper lobe laterally on image 32 series 5 measuring 7 mm.  Small hiatal hernia.  Limited images through the upper abdomen show no acute finding.  Osteopenia.  No acute osseous finding.  IMPRESSION: Right middle and lower lobe pulmonary emboli. No CT evidence for right ventricular heart strain.  Cardiomegaly with coronary artery calcification.  Centrolobular emphysematous changes.  Bilateral posterior consolidations and scattered nodular opacities measuring up to 7 mm. May be infectious/inflammatory.   Recommend a short-term follow-up chest CT in 2-3 months to document resolution.  Critical Value/emergent results were called by telephone at the time of interpretation on 06/17/2012 at 07:00 a.m. to [facility], who verbally acknowledged these results.   Original Report Authenticated By: Jearld Lesch, M.D.    Ir Ivc Filter Plmt / S&i /img Guid/mod Sed  06/17/2012  *RADIOLOGY REPORT*  Indication: 78 year-old with pulmonary embolism.  The patient is not a candidate for long-term anticoagulation due to history of GI bleed.  PROCEDURE(S): IVC FILTER PLACEMENT; IVC VENOGRAM; ULTRASOUND FOR VASCULAR ACCESS  Physician:  Rachelle Hora. Lowella Dandy, MD  Medications: Versed 4 mg  Fluoroscopy time: 1.8 minutes  Contrast:50 ml Omnipaque-300  Procedure:Informed consent was obtained for an IVC venogram and filter placement.  Ultrasound demonstrated a patent right internal jugular vein.  Ultrasound images were obtained for documentation. The right side of the neck was prepped and draped in a sterile fashion.  Maximal barrier sterile technique was utilized including caps, mask, sterile gowns, sterile gloves, sterile drape, hand hygiene and skin antiseptic.  The skin was anesthetized with 1% lidocaine.  A 21 gauge needle was directed into the vein with ultrasound guidance and a micropuncture dilator set was placed.  A wire was advanced into  the IVC.  The filter sheath was advanced over the wire into the IVC.  An IVC venogram was performed. Fluoroscopic images were obtained for documentation.  A Denali filter was deployed below the lowest renal vein. A follow-up venogram was performed and the vascular sheath was removed with manual compression.  Findings:IVC was patent.  Bilateral renal veins were identified. The filter was deployed below the lowest renal vein.  Follow-up venogram confirmed placement within the IVC and below the renal veins.  Impression:Successful placement of a retrievable IVC filter.   Original Report Authenticated By: Richarda Overlie, M.D.    US Aorta  06/16/2012  *RADIOLOGY REPORT*  Clinical Data:  Back pain  ULTRASOUND OF ABDOMINAL AORTA  Technique:  Ultrasound examination of the abdominal aorta was performed to evaluate for  abdominal aortic aneurysm.  Comparison: None.  Abdominal Aorta:  No aneurysm identified.        Maximum AP diameter:  2.1 cm.       Maximum TRV diameter:  1.5 cm.  IMPRESSION: No abdominal aortic aneurysm identified.   Original Report Authenticated By: Charline Bills, M.D.     Brief H and P: For complete details please refer to admission H and P, but in brief patient is an 78 y.o. female with past medical history of hypertension, DM 2, paroxysmal atrial fibrillation not on Coumadin because of prior GI bleed. Patient came in to the hospital because of chest pain. After patient woke up this morning, she was about to take her medication when she had substernal chest pain, was 10/10, she said she felt like a donkey hit her chest (crushing chest pain), this chest pain was associated with nausea and sweating but no vomiting. Said the chest pain lasted for 10-15 minutes and she came to the hospital for further evaluation. She denies any syncope, palpitations, shortness of breath or lower extremity swelling. Patient's been chest pain free since she's been in the hospital. First set of cardiac enzymes were negative,  EKG showed atrial fibrillation. We were asked to admit her for further evaluation and management.     Hospital Course:  Principal Problem:  *PE (pulmonary embolism) Active Problems:  HYPERTENSION  Atrial fibrillation  DIABETES MELLITUS, TYPE II  Chest pain   Acute PE -Diagnosed via CT Angio of the chest. -She had been deemed in the past to not be a good anticoagulation candidate given her history of severe GI bleeds. -We have elected to place an IVC filter, which has been done successfully.  Chest Pain -Resolved. -Believed 2/2 her PE. -She has ruled out for ACS with negative cardiac enzymes and several EKGs that do not demonstrate acute ischemic abnormalities.  Rest of chronic issues have been stable and her home medications have not been altered.  Time spent on Discharge: Greater than 30 minutes.  SignedChaya Jan [facility] Pager: [phone] 06/18/2012, 8:38 AM

## 2012-06-18 NOTE — Addendum Note (Signed)
Addended by: Boone Master E on: 06/18/2012 10:27 AM   Modules accepted: Orders

## 2012-06-19 ENCOUNTER — Other Ambulatory Visit: Payer: Self-pay | Admitting: Cardiology

## 2012-06-19 MED ORDER — FUROSEMIDE 40 MG PO TABS: 20.0000 mg | ORAL_TABLET | Freq: Every day | ORAL | Status: DC

## 2012-06-19 MED ORDER — DIGOXIN 125 MCG PO TABS: 0.1250 mg | ORAL_TABLET | Freq: Every day | ORAL | Status: DC

## 2012-06-21 ENCOUNTER — Ambulatory Visit (HOSPITAL_BASED_OUTPATIENT_CLINIC_OR_DEPARTMENT_OTHER): Payer: Medicare Other | Attending: Internal Medicine

## 2012-06-21 VITALS — Ht 66.0 in | Wt 140.0 lb

## 2012-06-21 DIAGNOSIS — J9610 Chronic respiratory failure, unspecified whether with hypoxia or hypercapnia: Secondary | ICD-10-CM | POA: Insufficient documentation

## 2012-06-21 DIAGNOSIS — G4733 Obstructive sleep apnea (adult) (pediatric): Secondary | ICD-10-CM

## 2012-07-03 ENCOUNTER — Encounter: Payer: Self-pay | Admitting: Family Medicine

## 2012-07-03 ENCOUNTER — Ambulatory Visit (INDEPENDENT_AMBULATORY_CARE_PROVIDER_SITE_OTHER): Payer: Medicare Other | Admitting: Family Medicine

## 2012-07-03 VITALS — BP 124/62 | HR 68 | Temp 98.5°F | Ht 67.0 in | Wt 142.2 lb

## 2012-07-03 DIAGNOSIS — I10 Essential (primary) hypertension: Secondary | ICD-10-CM

## 2012-07-03 DIAGNOSIS — E119 Type 2 diabetes mellitus without complications: Secondary | ICD-10-CM

## 2012-07-03 DIAGNOSIS — R350 Frequency of micturition: Secondary | ICD-10-CM | POA: Insufficient documentation

## 2012-07-03 DIAGNOSIS — I2699 Other pulmonary embolism without acute cor pulmonale: Secondary | ICD-10-CM

## 2012-07-03 LAB — POCT URINALYSIS DIPSTICK
Bilirubin, UA: NEGATIVE
Ketones, UA: NEGATIVE
Leukocytes, UA: NEGATIVE
Spec Grav, UA: 1.015

## 2012-07-03 NOTE — Patient Instructions (Addendum)
Try to get a urine specimen on the way out  Make sure you are getting enough fluids  No change in medicines  You can check sugar daily- alternating am fasting and after meal  Follow up as planned  We will order her follow up CT at next appt

## 2012-07-03 NOTE — Progress Notes (Signed)
Subjective:    Patient ID: [name], female    DOB: 05-14-1930, 79 y.o.   MRN: [id number]  HPI Here for hospital follow up  Pt hosp 12/1-12/19 for pulmonary emboli (in [facility]) Not anticoag candidate (GI bleed in past)- has hx of afib IVC filter was placed   Pulse ox today is 95% on 2 L   It was recommended she re check CT in 2-3 months   Wt is up 2 lb- but at home has lost some weight  Wearing a lot of clothing   bp good 124/62 Sugar control  Sugars were starting to come down- am as low as 110- coming down gradually -perhaps avg in 130s 2 hours pp- depends on how much she eats -- as high as 295- for the most part in the 200s  Will eat a sandwhich/ pickle / chips or ensure if she is not hungry   Yesterday c/o of fatigue -wanted to lie down and did not want to do her PT (they come out to the house) Some low back pain / and ? Urinary symptoms ? Not drinking enough water   Good days and bad days with confusion and compliance with meds and her O2 and also PT    Patient Active Problem List  Diagnosis  . HYPERCHOLESTEROLEMIA  . ANEMIA  . Former smoker  . MORTON'S NEUROMA  . MACULAR DEGENERATION  . HYPERTENSION  . Atrial fibrillation  . ALLERGIC RHINITIS  . OSTEOPOROSIS  . DIABETES MELLITUS, TYPE II  . UNSPECIFIED VITAMIN D DEFICIENCY  . Mixed incontinence urge and stress  . Dementia  . Acute diastolic CHF (congestive heart failure)  . COPD (chronic obstructive pulmonary disease)  . Acute and chronic respiratory failure  . Esophageal dysmotility suspected  . GERD (gastroesophageal reflux disease)  . Chest pain  . PE (pulmonary embolism)   Past Medical History  Diagnosis Date  . Allergy     allergic Rhintis  . Hyperlipidemia   . Hypertension   . Osteoporosis   . PAF (paroxysmal atrial fibrillation)     a. PAF dates back > 5 yrs, prev on coumadin->d/c 2006 2/2 GIB;   . Carotid stenosis     a. 09/2007: Carotid Doppler stable- stable bilateral stenosis 40-59% (from  06)  . Type II diabetes mellitus   . Tobacco abuse     a. ongoing - 50+ pack yr hx.  . Dementia     Dementia in Hospital //Does O.K. at home.  . Diverticulosis   . GI bleeding     a. when on coumadin in 2006 - colonoscopy @ that time showed 2 large right colon avm's, multiple small bowel avm's, fresh blood in prox small bowel.  . AVM (arteriovenous malformation) of colon     a. 2006 colonscopy  . Chronic respiratory failure   . COPD (chronic obstructive pulmonary disease)   . Chronic diastolic CHF (congestive heart failure)   . Normocytic anemia 03/2012  . Complication of anesthesia   . PONV (postoperative nausea and vomiting)   . Shortness of breath   . Pneumonia     MULTIPLE TIMES"  . Heart murmur   . GERD (gastroesophageal reflux disease)    Past Surgical History  Procedure Date  . Cardiac catheterization 1990's    clear  . Orif tibia & fibula fractures 05/2004  . Small bowel enteroscopy 03/2005    AVMS  . Colonoscopy 08/2004  . Esophagogastroduodenoscopy 08/2004    Multiple    History  Substance Use Topics  . Smoking status: Former Smoker -- 0.5 packs/day for 50 years    Types: Cigarettes    Quit date: 04/21/2012  . Smokeless tobacco: Never Used  . Alcohol Use: No   Family History  Problem Relation Age of Onset  . Stroke Mother     a. believes she died suddenly @ 89.  . Diabetes Mother   . Stroke Father     Died from cerebral hemorrhage @ 24  . Diabetes Father   . Cancer Sister     colon and stomach cancer  . Diabetes Brother   . Heart disease Brother   . Cancer Brother     died from lung cancer  . Heart disease Brother     MI  . Cancer Sister     ? liver//brain cancer  . Heart disease Sister   . Diabetes Sister     diabetes and glaucoma  . Cancer Brother     pancreatic cancer  . Diabetes Son    Allergies  Allergen Reactions  . Azithromycin     REACTION: inc blood sugar  . Codeine Itching  . Coumadin (Warfarin) Other (See Comments)    Causes  bleeding  . Oxybutynin Chloride Er     Constipation/ dizziness/ dry mouth   Current Outpatient Prescriptions on File Prior to Visit  Medication Sig Dispense Refill  . aspirin 81 MG tablet Take 81 mg by mouth daily.        [name] atorvastatin (LIPITOR) 10 MG tablet Take 10 mg by mouth daily.      . Budesonide 90 MCG/ACT inhaler Inhale 2 puffs into the lungs 2 (two) times daily.      . Cholecalciferol (VITAMIN D-3) 1000 UNITS CAPS Take 1 capsule by mouth daily.      . digoxin (LANOXIN) 0.125 MG tablet Take 1 tablet (0.125 mg total) by mouth daily.  30 tablet  5  . diltiazem (CARDIZEM CD) 240 MG 24 hr capsule Take 240 mg by mouth daily.      . fluticasone (FLONASE) 50 MCG/ACT nasal spray Place 2 sprays into the nose daily as needed. For allergies      . furosemide (LASIX) 40 MG tablet Take 0.5-1 tablets (20-40 mg total) by mouth daily. 20 mg daily 40 mg if gains 3 lbs or more.  30 tablet  5  . glucose blood (ONE TOUCH ULTRA TEST) test strip 1 each by Other route as needed. Use as instructed Check blood sugar daily and as needed       . ipratropium (ATROVENT) 0.02 % nebulizer solution Take 500 mcg by nebulization 3 (three) times daily.      [name] levalbuterol (XOPENEX) 0.63 MG/3ML nebulizer solution Take 1 ampule by nebulization 3 (three) times daily.      . metFORMIN (GLUCOPHAGE) 500 MG tablet Take 500 mg by mouth 2 (two) times daily before a meal.      . potassium chloride SA (K-DUR,KLOR-CON) 20 MEQ tablet Take 20 mEq by mouth daily.           Review of Systems Review of Systems  Constitutional: Negative for fever, appetite change, fatigue and unexpected weight change.  Eyes: Negative for pain and visual disturbance.  Respiratory: Negative for cough and shortness of breath. Neg for acute wheezing    Cardiovascular: Negative for cp or palpitations    Gastrointestinal: Negative for nausea, diarrhea and constipation.  Genitourinary: Negative for urgency and pos for frequency/ neg for dysuria or  blood  in urine  Skin: Negative for pallor or rash   Neurological: Negative for weakness, light-headedness, numbness and headaches.  Hematological: Negative for adenopathy. Does not bruise/bleed easily.  Psychiatric/Behavioral: Negative for dysphoric mood. The patient is not nervous/anxious. Pos for memory loss without acute confusion        Objective:   Physical Exam  Constitutional: She appears well-developed and well-nourished. No distress.       Frail appearing elderly female wearing 02 by [place]   HENT:  Head: Normocephalic and atraumatic.  Mouth/Throat: Oropharynx is clear and moist.  Eyes: Conjunctivae normal and EOM are normal. Pupils are equal, round, and reactive to light. Right eye exhibits no discharge. Left eye exhibits no discharge. No scleral icterus.  Neck: Normal range of motion. Neck supple. No JVD present. Carotid bruit is not present. No thyromegaly present.  Cardiovascular: Normal rate, regular rhythm, normal heart sounds and intact distal pulses.  Exam reveals no gallop.   Pulmonary/Chest: Effort normal and breath sounds normal. No respiratory distress. She has no wheezes. She has no rales. She exhibits no tenderness.       Diffusely distant bs  Harsh bs at bases/ no crackles or wheeze today  Air exch is fair  Abdominal: Soft. Bowel sounds are normal. She exhibits no distension and no mass. There is no tenderness.  Musculoskeletal: She exhibits no edema and no tenderness.  Lymphadenopathy:    She has no cervical adenopathy.  Neurological: She is alert. She has normal reflexes. No cranial nerve deficit. She exhibits normal muscle tone. Coordination normal.  Skin: Skin is warm and dry. No rash noted. No erythema. No pallor.  Psychiatric: She has a normal mood and affect.          Assessment & Plan:

## 2012-07-05 NOTE — Assessment & Plan Note (Signed)
bp in fair control at this time  No changes needed  Disc lifstyle change with low sodium diet and exercise   hosp records rev Will continue to follow

## 2012-07-05 NOTE — Assessment & Plan Note (Signed)
DM is improved overall- better sugar control in am, though pm sugars depend on diet Will continue to monitor and f/u as planned in 2-3 mo

## 2012-07-05 NOTE — Assessment & Plan Note (Signed)
ua neg May be due to overactive bladder-will continue to watch - family is caring for her and will be on the lookout for s/s of uti  Did enc to increase her water intake

## 2012-07-05 NOTE — Assessment & Plan Note (Signed)
Pt is doing very well -no resp symptoms at all s/p IVC filter This will likely remain in place indefinitely since pt cannot have anticoag due to prev GI bleed  Pulse ox is stable with 02 by nasal cannula - no change in copd  Follow up planned

## 2012-07-13 ENCOUNTER — Encounter: Payer: Self-pay | Admitting: Internal Medicine

## 2012-07-13 ENCOUNTER — Telehealth: Payer: Self-pay | Admitting: Cardiology

## 2012-07-13 ENCOUNTER — Ambulatory Visit (INDEPENDENT_AMBULATORY_CARE_PROVIDER_SITE_OTHER): Payer: Medicare Other | Admitting: Internal Medicine

## 2012-07-13 VITALS — BP 128/64 | HR 77 | Temp 97.0°F | Ht 67.0 in | Wt 144.4 lb

## 2012-07-13 DIAGNOSIS — J449 Chronic obstructive pulmonary disease, unspecified: Secondary | ICD-10-CM

## 2012-07-13 DIAGNOSIS — Z23 Encounter for immunization: Secondary | ICD-10-CM

## 2012-07-13 NOTE — Telephone Encounter (Signed)
New Problem:    Called in needing Dr. Antoine Poche to sign an order that they need to release the patient's billing.  Please call back.

## 2012-07-13 NOTE — Patient Instructions (Addendum)
#  COPD  - stable; continue o2 and current meds  #Overall fraility   - continue conditioning exercises  #Sleep study/apnea  - test on 06/21/12 shows moderate sleep apnea; this will need specialist input. Please see Dr Shelle Iron for followup  #Followup  - I will call you for discussion on overall health  - see Dr Shelle Iron  - see me in 6 months or sooner if needed

## 2012-07-13 NOTE — Telephone Encounter (Signed)
Spoke to [place] at [facility]. She stated she faxed over a order that needs to be signed by Dr.Hochrein and faxed back to her 07/15/12.Message sent to Dr.Hochrein's nurse.

## 2012-07-13 NOTE — Progress Notes (Signed)
Subjective:    Patient ID: [name], female    DOB: 04-21-1930, 79 y.o.   MRN: [id number]  HPI PCP is Roxy Manns, MD   #Acute Respiratory Failure #Chronic Respiratory Failure / COPD - June 2010 - intubated with acinetobacter (resistant) lower lobe pneumonia following ankle fracture  - Admited 04/21/12 - 05/05/12 - Rx Bipap. Discharged on nocturnal NIPPV. In hospital PCO2 71  #Tobacco Abuse  - quit Oct 2013 post admission  #. Acute on Chronic Diastolic CHF  - EF 55-60% by echo 04/22/12  #Cachexia - Weight 150-->143lbs  - Body mass index is 22.55 kg/(m^2).  # Atrial Fibrillation w/ RVR  - H/o PAF. Not anticoagulation candidate due to h/o AVM/GIB, dementia, and fall risk  - Conservative therapy with rate control   #Other issues   -  Diabetes Mellitus Type 2, Anemia of chronic disease    OV 05/14/12  - Followup followin recent admission above HCAP/AECOPD/Acute on chronic respiratory failure. She is her with son and daughter in law. She is in SNF. She is getting stronger and doing some rehab. She looks better. No resp complaints. She has quit smoking an swears she will never smoke again. Meds reviewed and noticed she is on mdi. SHe looks too frail to do mdi. Son pleased with her progres  06/11/2012 Follow up and Med review  Patient returns for a one-month followup and medication review.  She has brought all of her medications in today for review. She has now been discharged from rehabilitation center back to home. She is living with her son. We reviewed all her medications and organized them into a medication calendar. Unfortunately, she misunderstood instructions were, it was not communicated at discharge to rehabilitation center. She is not taking her nebulizers as recommended from last visit. She is also only taking Pulmicort inhaler once daily. She is slowly getting stronger and is now able to walk with her walker and do independent. Dressing. She does require assistance  with bathing in the shower, mainly with only balance issues. Her dyspnea has decreased in her activity tolerance is slowly improving as long as she wears her oxygen. She continues on CPAP at night and has an upcoming sleep study. Next month. Her family is concerned because they're currently pain, for CPAP out-of-pocket, and this has not been ran to her insurance. She did have hypercarbia during her last admission with a PCO2 of 71. She denies any hemoptysis, orthopnea, PND, or increased leg swelling She was supposed to undergo a pulmonary function test. This morning, however, was unable to complete the test, you to inability to hold her breath  REC Continue oxygen  Continue cpap at night  Increase Pulmicort inhaler 2 puffs twice daily  Increase xopenex nebulizer 4 times daily and Ipratropium nebulizer 4 times daily  Follow med calendar closely and bring to each visit.  follow up Dr. Marchelle Gearing in 4 weeks and As needed  Please contact office for sooner follow up if symptoms do not improve or worsen or seek emergency care      OV 07/13/2012  - her with son and ? D-i-l. In interim PE admit and s/p IVC filter (no coumadin due to fall risk and easy bleed hx). Since then dementia worse. Home PT ongoing for short term. Overall frail. ECOG 3. COPD CAT score is 25 and reflects significant disease burden Needing lot of assist. She is feeling like a caged animal. Family providing constant care and assitance; facing burnout. Patient herself feels like  ccaged animal and feels caregivers not giving her assistance. Yet she cannot live by herself. Resulting in tension. Son privately confiding that dementia worse. They want to talk to me privately. COPD is stable . She uses nocturnal cpap but had sleep study 06/21/12 and wants to know results so it can be paid for. Per Dr Shelle Iron - moderate OSA present but no time to complete titration protocol   CAT COPD Symptom & Quality of Life Score (GSK trademark) 0 is no  burden. 5 is highest burden 07/13/2012   Never Cough -> Cough all the time 4  No phlegm in chest -> Chest is full of phlegm 2  No chest tightness -> Chest feels very tight 2  No dyspnea for 1 flight stairs/hill -> Very dyspneic for 1 flight of stairs 5  No limitations for ADL at home -> Very limited with ADL at home 5  Confident leaving home -> Not at all confident leaving home 3  Sleep soundly -> Do not sleep soundly because of lung condition 1  Lots of Energy -> No energy at all 3  TOTAL Score (max 40)  25    Past, Family, Social reviewed: no change since last visit other than as reviewed above    Review of Systems  Constitutional: Negative for fever and unexpected weight change.  HENT: Negative for ear pain, nosebleeds, congestion, sore throat, rhinorrhea, sneezing, trouble swallowing, dental problem, postnasal drip and sinus pressure.   Eyes: Negative for redness and itching.  Respiratory: Negative for cough, chest tightness, shortness of breath and wheezing.   Cardiovascular: Negative for palpitations and leg swelling.  Gastrointestinal: Negative for nausea and vomiting.  Genitourinary: Negative for dysuria.  Musculoskeletal: Negative for joint swelling.  Skin: Negative for rash.  Neurological: Negative for headaches.  Hematological: Does not bruise/bleed easily.  Psychiatric/Behavioral: Negative for dysphoric mood. The patient is not nervous/anxious.        Objective:   Physical Exam GEN: A/Ox3; pleasant , NAD, elderly and chronically ill appearing   HEENT:  [place]/AT,  EACs-clear, TMs-wnl, NOSE-clear, THROAT-clear, no lesions, no postnasal drip or exudate noted.   NECK:  Supple w/ fair ROM; no JVD; normal carotid impulses w/o bruits; no thyromegaly or nodules palpated; no lymphadenopathy.  RESP  Diminshed BS in bases .no accessory muscle use, no dullness to percussion  CARD:  RRR, no m/r/g  , no peripheral edema, pulses intact, no cyanosis or clubbing.  GI:   Soft & nt;  nml bowel sounds; no organomegaly or masses detected.  Musco: Warm bil, no deformities or joint swelling noted.   Neuro: alert, no focal deficits noted.    Skin: Warm, no lesions or rashes          Assessment & Plan:

## 2012-07-14 DIAGNOSIS — G4710 Hypersomnia, unspecified: Secondary | ICD-10-CM

## 2012-07-14 DIAGNOSIS — G4730 Sleep apnea, unspecified: Secondary | ICD-10-CM

## 2012-07-14 NOTE — Telephone Encounter (Signed)
Order will be signed and faxed by MD as soon as possible

## 2012-07-15 ENCOUNTER — Encounter: Payer: Self-pay | Admitting: Pulmonary Disease

## 2012-07-15 ENCOUNTER — Ambulatory Visit (INDEPENDENT_AMBULATORY_CARE_PROVIDER_SITE_OTHER): Payer: Medicare Other | Admitting: Pulmonary Disease

## 2012-07-15 ENCOUNTER — Telehealth: Payer: Self-pay | Admitting: *Deleted

## 2012-07-15 VITALS — BP 110/62 | HR 105 | Temp 98.0°F | Ht 67.0 in | Wt 144.0 lb

## 2012-07-15 DIAGNOSIS — G4733 Obstructive sleep apnea (adult) (pediatric): Secondary | ICD-10-CM

## 2012-07-15 NOTE — Patient Instructions (Addendum)
Will have your dme set you machine pressure to 10cm, and will also add a heated humidifier Will get you a new mask followup with me in 6 weeks, but call if having issues with cpap.

## 2012-07-15 NOTE — Progress Notes (Signed)
  Subjective:    Patient ID: [name], female    DOB: [birth year], 79 y.o.   MRN: [id number]  HPI The patient is an 79 year old female who been asked to see for management of obstructive sleep apnea.  She was recently in the hospital with acute on chronic respiratory failure, and was felt to have obstructive sleep apnea.  Was sent home on CPAP of unknown pressure level.  She is now had a sleep study that showed moderate OSA, with an AHI of 21 events per hour.  Her pressure was never optimized as part of a split night protocol.  The patient has been noted to have loud snoring, but no one has commented on pauses in her breathing during sleep.  She has frequent awakenings at night, and is on rested most mornings.  She falls asleep easily during the day with periods of inactivity.  Weight is essentially unchanged over the last few years, and her epworthvscore today is 16.  Sleep Questionnaire: What time do you typically go to bed?( Between what hours) 7-8pm How long does it take you to fall asleep? 2-3 hours How many times during the night do you wake up? 6 What time do you get out of bed to start your day? 0900 Do you drive or operate heavy machinery in your occupation? No How much has your weight changed (up or down) over the past two years? (In pounds) 5 lb (2.268 kg) Have you ever had a sleep study before? Yes If yes, location of study? [facility] If yes, date of study? 05/2012 Do you currently use CPAP? Yes If so, what pressure? Unsure Do you wear oxygen at any time? Yes O2 Flow Rate (L/min) 2 L/min 2 L/min 2 L/min    Review of Systems  Constitutional: Negative for fever and unexpected weight change.  HENT: Positive for congestion, trouble swallowing and sinus pressure. Negative for ear pain, nosebleeds, sore throat, rhinorrhea, sneezing, dental problem and postnasal drip.   Eyes: Negative for redness and itching.  Respiratory: Positive for cough and shortness of breath. Negative for chest  tightness and wheezing.   Cardiovascular: Positive for chest pain. Negative for palpitations and leg swelling.  Gastrointestinal: Positive for abdominal pain. Negative for nausea and vomiting.  Genitourinary: Negative for dysuria.  Musculoskeletal: Positive for joint swelling.  Skin: Negative for rash.  Neurological: Negative for headaches.  Hematological: Does not bruise/bleed easily.  Psychiatric/Behavioral: Negative for dysphoric mood. The patient is nervous/anxious.        Objective:   Physical Exam Constitutional:  Well developed, no acute distress  HENT:  Nares patent without discharge, septal deviation to left  Oropharynx without exudate, palate and uvula are elongated  Eyes:  Perrla, eomi, no scleral icterus  Neck:  No JVD, no TMG  Cardiovascular:  Normal rate, irregular rhythm, no rubs or gallops.  No murmurs        Intact distal pulses  Pulmonary :  decreased breath sounds, no stridor or respiratory distress   No rales, rhonchi, or wheezing  Abdominal:  Soft, nondistended, bowel sounds present.  No tenderness noted.   Musculoskeletal:  + lower extremity edema noted.  Lymph Nodes:  No cervical lymphadenopathy noted  Skin:  No cyanosis noted  Neurologic:  Alert, appropriate, moves all 4 extremities without obvious deficit.         Assessment & Plan:

## 2012-07-15 NOTE — Procedures (Signed)
[name], [name]                ACCOUNT NO.:  [id number]  MEDICAL RECORD NO.:  [id number]          PATIENT TYPE:  OUT  LOCATION:  SLEEP CENTER                 FACILITY:  [facility]  PHYSICIAN:  Barbaraann Share, MD,FCCPDATE OF BIRTH:  1931/08/20  DATE OF STUDY:                           NOCTURNAL POLYSOMNOGRAM  REFERRING PHYSICIAN:  Kalman Shan, MD  INDICATION FOR STUDY:  Hypersomnia with sleep apnea.  EPWORTH SLEEPINESS SCORE:  12.  MEDICATIONS:  SLEEP ARCHITECTURE:  The patient had a total sleep time of 366 minutes with a slow-wave sleep and decreased quantity of REM.  Sleep onset latency was normal at 1.5 minutes and REM onset did not occur until the titration portion of the study.  Sleep efficiency was 88% during the diagnostic portion of the study and 89% during the titration portion.  RESPIRATORY DATA:  The patient was found to have 48 obstructive events in the first 137 minutes of sleep.  This gave the patient and apnea- hypopnea index of 21 events per hour during the diagnostic portion of the study.  The events occurred almost all in the supine position, and there was minimal snoring noted throughout.  By protocol, the patient was fitted with a medium Quattro FX full face mask, and CPAP titration was initiated.  She was increased as high as a pressure of 12 cm of water, but continued to have breakthrough.  Further titration was not able to be done secondary to the time constraints of a split night study.  OXYGEN DATA:  There was O2 desaturation as low as 91% with the patient's obstructive events.  It should be noted the patient's study was done on 2 L nasal oxygen and she wears at home.  CARDIAC DATA:  The patient was noted to be in atrial fibrillation with a controlled ventricular response throughout.  MOVEMENT-PARASOMNIA:  The patient had no significant leg jerks or other abnormal behaviors noted.  IMPRESSIONS-RECOMMENDATIONS: 1. Moderate obstructive sleep  apnea/hypopnea syndrome with an AHI of     21 events per hour and oxygen desaturation is 91% during the     diagnostic portion of the study.  The patient was then fitted with     a medium Quattro FX full face mask, but unfortunately there was not     adequate time to achieve therapeutic pressure.  I would recommend     either a return to the sleep center for formal titration study or     using an autotitration device as an outpatient.  Clinical     correlation is suggested. 2. It should be noted the patient study was done on 2 L of nasal     oxygen as she wears at home. 3. The patient was noted to be in atrial fibrillation with a     controlled ventricular response throughout the night.     Barbaraann Share, MD,FCCP Diplomate, American Board of Sleep Medicine    [facility]/MEDQ  D:  07/14/2012 18:29:41  T:  07/15/2012 02:27:18  Job:  [id number]

## 2012-07-15 NOTE — Telephone Encounter (Signed)
Patient seen today by Dr Shelle Iron and had questions in regards to O2.  Patient states that she is using her O2 at home when moving around but keeps finding it hard to get around at times//getting tangled up with the hose. Patient states that she was told last OV with MR that as long as she was 88% or above she is okay to not use the O2 because 88% is good for her. Her main question or concern is if she can limit her use of O2 while at home, checking her O2 frequently and only using the O2 if drops below 88%.   Per KC, wait on response from MR before making any changes.   Dr Marchelle Gearing please advise. Patient aware that MR out of office until Friday. Patient okay with waiting until then for a response. Thanks.

## 2012-07-15 NOTE — Assessment & Plan Note (Signed)
The patient has moderate obstructive sleep apnea by her recent sleep study, and has significant underlying cardiopulmonary disease that can be affected by her sleep disordered breathing.  I do think it is worth treating her with CPAP, and we'll try and get her current machine adjusted properly.  I had a long discussion with her about sleep apnea, including its impact to her quality of life and cardiovascular health.  We will need to be careful with air trapping, given her level of COPD.

## 2012-07-16 ENCOUNTER — Ambulatory Visit (INDEPENDENT_AMBULATORY_CARE_PROVIDER_SITE_OTHER): Payer: Medicare Other | Admitting: Cardiology

## 2012-07-16 ENCOUNTER — Encounter: Payer: Self-pay | Admitting: Cardiology

## 2012-07-16 VITALS — BP 117/75 | HR 68 | Ht 66.0 in | Wt 141.0 lb

## 2012-07-16 DIAGNOSIS — I5031 Acute diastolic (congestive) heart failure: Secondary | ICD-10-CM

## 2012-07-16 DIAGNOSIS — I509 Heart failure, unspecified: Secondary | ICD-10-CM

## 2012-07-16 DIAGNOSIS — I10 Essential (primary) hypertension: Secondary | ICD-10-CM

## 2012-07-16 NOTE — Patient Instructions (Addendum)
The current medical regimen is effective;  continue present plan and medications.  Follow up as needed with Dr Hochrein 

## 2012-07-16 NOTE — Progress Notes (Signed)
HPI  The patient is an 79 year old female with multiple issues which include PAF and HTN. She was hospitalized late last year with a COPD exacerbation and atrial fib with RVR. Noted to have bilateral PNA. Echo showed EF 55 to 60%. She remains in atrial fib. Rate was controlled. She has not been a Coumadin candidate because of GI bleeding. In fact she was hospitalized in December at this with pulmonary embolism. Anticoagulation could not be used and and a vena cava filter was placed.  The patient is now back home. She does have home O2 at nighttime CPAP. With this she denies any acute shortness of breath. She occasionally feels her heart beating but she denies any presyncope or syncope. She's had no chest pressure, neck or arm discomfort. She's had no weight gain or edema.  Allergies  Allergen Reactions  . Azithromycin     REACTION: inc blood sugar  . Codeine Itching  . Coumadin (Warfarin) Other (See Comments)    Causes bleeding  . Oxybutynin Chloride Er     Constipation/ dizziness/ dry mouth    Current Outpatient Prescriptions  Medication Sig Dispense Refill  . aspirin 81 MG tablet Take 81 mg by mouth daily.        [name] atorvastatin (LIPITOR) 10 MG tablet Take 10 mg by mouth daily.      . Budesonide 90 MCG/ACT inhaler Inhale 2 puffs into the lungs 2 (two) times daily.      . Cholecalciferol (VITAMIN D-3) 1000 UNITS CAPS Take 1 capsule by mouth daily.      . digoxin (LANOXIN) 0.125 MG tablet Take 1 tablet (0.125 mg total) by mouth daily.  30 tablet  5  . diltiazem (CARDIZEM CD) 240 MG 24 hr capsule Take 240 mg by mouth daily.      . fluticasone (FLONASE) 50 MCG/ACT nasal spray Place 2 sprays into the nose daily as needed. For allergies      . furosemide (LASIX) 40 MG tablet Take 0.5-1 tablets (20-40 mg total) by mouth daily. 20 mg daily 40 mg if gains 3 lbs or more.  30 tablet  5  . GLIPIZIDE XL 5 MG 24 hr tablet Take 1 tablet by mouth daily.      [name] glucose blood (ONE TOUCH ULTRA TEST)  test strip 1 each by Other route as needed. Use as instructed Check blood sugar daily and as needed       . ipratropium (ATROVENT) 0.02 % nebulizer solution Take 500 mcg by nebulization 3 (three) times daily.      . lansoprazole (PREVACID) 15 MG capsule Take 15 mg by mouth daily.      [name] levalbuterol (XOPENEX) 0.63 MG/3ML nebulizer solution Take 1 ampule by nebulization 3 (three) times daily.      . metFORMIN (GLUCOPHAGE) 500 MG tablet Take 500 mg by mouth 2 (two) times daily before a meal.      . potassium chloride SA (K-DUR,KLOR-CON) 20 MEQ tablet Take 20 mEq by mouth daily.        Past Medical History  Diagnosis Date  . Allergy     allergic Rhintis  . Hyperlipidemia   . Hypertension   . Osteoporosis   . PAF (paroxysmal atrial fibrillation)     a. PAF dates back > 5 yrs, prev on coumadin->d/c 2006 2/2 GIB;   . Carotid stenosis     a. 09/2007: Carotid Doppler stable- stable bilateral stenosis 40-59% (from 06)  . Type II diabetes mellitus   .  Tobacco abuse     a. ongoing - 50+ pack yr hx.  . Dementia     Dementia in Hospital //Does O.K. at home.  . Diverticulosis   . GI bleeding     a. when on coumadin in 2006 - colonoscopy @ that time showed 2 large right colon avm's, multiple small bowel avm's, fresh blood in prox small bowel.  . AVM (arteriovenous malformation) of colon     a. 2006 colonscopy  . Chronic respiratory failure   . COPD (chronic obstructive pulmonary disease)   . Chronic diastolic CHF (congestive heart failure)   . Normocytic anemia 03/2012  . Complication of anesthesia   . PONV (postoperative nausea and vomiting)   . Shortness of breath   . Pneumonia     MULTIPLE TIMES"  . Heart murmur   . GERD (gastroesophageal reflux disease)     Past Surgical History  Procedure Date  . Cardiac catheterization 1990's    clear  . Orif tibia & fibula fractures 05/2004  . Small bowel enteroscopy 03/2005    AVMS  . Colonoscopy 08/2004  . Esophagogastroduodenoscopy 08/2004      Multiple     ROS:  As stated in the HPI and negative for all other systems.  PHYSICAL EXAM BP 117/75  Pulse 68  Ht 5\' 6"  (1.676 m)  Wt 141 lb (63.957 kg)  BMI 22.76 kg/m2 GEN:  No distress, frail NECK:  No jugular venous distention at 90 degrees, waveform within normal limits, carotid upstroke brisk and symmetric, no bruits, no thyromegaly LYMPHATICS:  No cervical adenopathy LUNGS:  Clear to auscultation bilaterally BACK:  No CVA tenderness CHEST:  Unremarkable HEART:  S1 and S2 within normal limits, no S3, no clicks, no rubs, no murmurs, irregular ABD:  Positive bowel sounds normal in frequency in pitch, no bruits, no rebound, no guarding, unable to assess midline mass or bruit with the patient seated. EXT:  2 plus pulses throughout, moderate edema, no cyanosis no clubbing SKIN:  No rashes no nodules, multiple bruises NEURO:  Cranial nerves II through XII grossly intact, motor grossly intact throughout PSYCH:  Cognitively intact, oriented to person place and time   ASSESSMENT AND PLAN  Acute on chronic respiratory failure -  She seems to be euvolemic at this point. No change in therapy is indicated. She will continue with salt and fluid restriction.  Chronic atrial fib -  She's had reasonable rate control. Because of bleeding she is not a candidate for anticoagulation. No change in therapy is indicated.  Tobacco abuse -  She remains off cigarettes. I applauded this.  Pulmonary embolism - She has a filter and is not an anticoagulation candidate. No change in therapy is indicated.

## 2012-07-17 NOTE — Telephone Encounter (Signed)
At night use o2 continuously regardless  At day time, she can titrate to keep pulse ox > 88%. If it drops with walking then she got to use it. If > 88% at rest ad she feels well, then she can hold off for that short time

## 2012-07-20 NOTE — Telephone Encounter (Signed)
lmomtcb x1 

## 2012-07-21 ENCOUNTER — Telehealth: Payer: Self-pay | Admitting: Family Medicine

## 2012-07-21 ENCOUNTER — Telehealth: Payer: Self-pay | Admitting: Pulmonary Disease

## 2012-07-21 DIAGNOSIS — L602 Onychogryphosis: Secondary | ICD-10-CM

## 2012-07-21 MED ORDER — BUDESONIDE 90 MCG/ACT IN AEPB: 2.0000 | INHALATION_SPRAY | Freq: Two times a day (BID) | RESPIRATORY_TRACT | Status: DC

## 2012-07-21 NOTE — Telephone Encounter (Signed)
Pt wants to know if you can refer her to a podatrist to have her toenails clipped. Thank you.

## 2012-07-21 NOTE — Telephone Encounter (Signed)
[name] aware of MR recs and verbalized understanding.

## 2012-07-21 NOTE — Telephone Encounter (Signed)
Will do podiatry ref

## 2012-07-21 NOTE — Telephone Encounter (Signed)
Pt's daughter-n-law notified referral was made

## 2012-07-21 NOTE — Telephone Encounter (Signed)
Per [facility], the pt will need two Pulmicort inhalers a month to last the 30 days. 1 inhaler only last for a 15 day period. New RX sent.  I also spoke with Tamela Oddi at [facility] and they apologized for missing part of the order that was sent to them on 07/15/12. Per Tamela Oddi, they will get in touch with the p[t and get the humidifier and mask out to her. [name] aware and will call us back in a few days if they do not hear from [facility].

## 2012-07-21 NOTE — Telephone Encounter (Signed)
lmtcb x2 

## 2012-07-31 ENCOUNTER — Telehealth: Payer: Self-pay | Admitting: Internal Medicine

## 2012-07-31 ENCOUNTER — Telehealth: Payer: Self-pay | Admitting: Family Medicine

## 2012-07-31 NOTE — Telephone Encounter (Signed)
Pt's son requesting call back from Dr. Milinda Antis.  He would like to discuss options for his mother.  He says pt is living with him and that she has become combative and it's become difficult to care for her.  He states "my family is falling apart".

## 2012-07-31 NOTE — Assessment & Plan Note (Signed)
#  COPD  - stable; continue o2 and current meds  #Overall fraility   - continue conditioning exercises  #Sleep study/apnea  - test on 06/21/12 shows moderate sleep apnea; this will need specialist input. Please see Dr Shelle Iron for followup  #Followup  - I will call you for discussion on overall health  - see Dr Shelle Iron  - see me in 6 months or sooner if needed   (> 50% of this 15 min visit spent in face to face counseling)

## 2012-07-31 NOTE — Telephone Encounter (Signed)
I listened to his concerns- her dementia is progressing and they may need to consider placement - however her mood is labile and at times she refuses this  I advised he try to get a social work consult through her prev home nurse co. And also that he connect with a lawyer who specializes in eldercare to disc POA/ adv directive and state of her competency, and also f/u with me to discuss memory/ do a MMS exam He was agreeable to this plan

## 2012-07-31 NOTE — Telephone Encounter (Signed)
Call her son

## 2012-08-07 ENCOUNTER — Ambulatory Visit (INDEPENDENT_AMBULATORY_CARE_PROVIDER_SITE_OTHER): Payer: Medicare Other | Admitting: Family Medicine

## 2012-08-07 ENCOUNTER — Encounter: Payer: Self-pay | Admitting: Family Medicine

## 2012-08-07 VITALS — BP 132/64 | HR 84 | Temp 98.1°F | Ht 66.0 in | Wt 138.5 lb

## 2012-08-07 DIAGNOSIS — F0390 Unspecified dementia without behavioral disturbance: Secondary | ICD-10-CM

## 2012-08-07 LAB — VITAMIN B12: Vitamin B-12: 146 pg/mL — ABNORMAL LOW (ref 211–911)

## 2012-08-07 MED ORDER — DONEPEZIL HCL 5 MG PO TABS: 5.0000 mg | ORAL_TABLET | Freq: Every day | ORAL | Status: DC

## 2012-08-07 NOTE — Progress Notes (Signed)
Subjective:    Patient ID: [name], female    DOB: 10/04/1929, 79 y.o.   MRN: [id number]  HPI Here for some memory issues- has had sundowning at the hospital in the past- and now at home Family is having trouble caring for her   She is having a very hard time remembering to take medicines - especially later in the day Asks repeditively if it is time to do something -and writes things down but still forgets them   She gets anxious over her 02 tubing- this causes a lot of problems Also quite irritable/ almost combative per her family - and changes her mind frequently about what she wants to do  Often disagreeable  4 nights ago - got lost in the house - going from bathroom to kitchen- this was worrisome Nights are worse  Has someone there 24 hour per day  No falls  Did almost let the dogs out  Does not prep meals   MMS exam today 19/30 Clock draw- had numbers right/ forgetting 9 and the hands were not positioned correctly   Patient Active Problem List  Diagnosis  . HYPERCHOLESTEROLEMIA  . ANEMIA  . Former smoker  . MORTON'S NEUROMA  . MACULAR DEGENERATION  . HYPERTENSION  . Atrial fibrillation  . ALLERGIC RHINITIS  . OSTEOPOROSIS  . DIABETES MELLITUS, TYPE II  . UNSPECIFIED VITAMIN D DEFICIENCY  . Mixed incontinence urge and stress  . Dementia  . Acute diastolic CHF (congestive heart failure)  . COPD (chronic obstructive pulmonary disease)  . Acute and chronic respiratory failure  . Esophageal dysmotility suspected  . GERD (gastroesophageal reflux disease)  . Chest pain  . PE (pulmonary embolism)  . Urine frequency  . OSA (obstructive sleep apnea)  . Hypertrophic toenail   Past Medical History  Diagnosis Date  . Allergy     allergic Rhintis  . Hyperlipidemia   . Hypertension   . Osteoporosis   . PAF (paroxysmal atrial fibrillation)     a. PAF dates back > 5 yrs, prev on coumadin->d/c 2006 2/2 GIB;   . Carotid stenosis     a. 09/2007: Carotid Doppler  stable- stable bilateral stenosis 40-59% (from 06)  . Type II diabetes mellitus   . Tobacco abuse     a. ongoing - 50+ pack yr hx.  . Dementia     Dementia in Hospital //Does O.K. at home.  . Diverticulosis   . GI bleeding     a. when on coumadin in 2006 - colonoscopy @ that time showed 2 large right colon avm's, multiple small bowel avm's, fresh blood in prox small bowel.  . AVM (arteriovenous malformation) of colon     a. 2006 colonscopy  . Chronic respiratory failure   . COPD (chronic obstructive pulmonary disease)   . Chronic diastolic CHF (congestive heart failure)   . Normocytic anemia 03/2012  . Complication of anesthesia   . PONV (postoperative nausea and vomiting)   . Shortness of breath   . Pneumonia     MULTIPLE TIMES"  . Heart murmur   . GERD (gastroesophageal reflux disease)    Past Surgical History  Procedure Date  . Cardiac catheterization 1990's    clear  . Orif tibia & fibula fractures 05/2004  . Small bowel enteroscopy 03/2005    AVMS  . Colonoscopy 08/2004  . Esophagogastroduodenoscopy 08/2004    Multiple    History  Substance Use Topics  . Smoking status: Former Smoker -- 0.5 packs/day  for 50 years    Types: Cigarettes    Quit date: 04/21/2012  . Smokeless tobacco: Never Used  . Alcohol Use: No   Family History  Problem Relation Age of Onset  . Stroke Mother     a. believes she died suddenly @ 3.  . Diabetes Mother   . Stroke Father     Died from cerebral hemorrhage @ 56  . Diabetes Father   . Cancer Sister     colon and stomach cancer  . Diabetes Brother   . Heart disease Brother   . Cancer Brother     died from lung cancer  . Heart disease Brother     MI  . Cancer Sister     ? liver//brain cancer  . Heart disease Sister   . Diabetes Sister     diabetes and glaucoma  . Cancer Brother     pancreatic cancer  . Diabetes Son    Allergies  Allergen Reactions  . Azithromycin     REACTION: inc blood sugar  . Codeine Itching  .  Coumadin (Warfarin) Other (See Comments)    Causes bleeding  . Oxybutynin Chloride Er     Constipation/ dizziness/ dry mouth   Current Outpatient Prescriptions on File Prior to Visit  Medication Sig Dispense Refill  . aspirin 81 MG tablet Take 81 mg by mouth daily.        [name] atorvastatin (LIPITOR) 10 MG tablet Take 10 mg by mouth daily.      . Budesonide 90 MCG/ACT inhaler Inhale 2 puffs into the lungs 2 (two) times daily.  2 Inhaler  5  . Cholecalciferol (VITAMIN D-3) 1000 UNITS CAPS Take 1 capsule by mouth daily.      . digoxin (LANOXIN) 0.125 MG tablet Take 1 tablet (0.125 mg total) by mouth daily.  30 tablet  5  . diltiazem (CARDIZEM CD) 240 MG 24 hr capsule Take 240 mg by mouth daily.      . fluticasone (FLONASE) 50 MCG/ACT nasal spray Place 2 sprays into the nose daily as needed. For allergies      . furosemide (LASIX) 40 MG tablet Take 0.5-1 tablets (20-40 mg total) by mouth daily. 20 mg daily 40 mg if gains 3 lbs or more.  30 tablet  5  . GLIPIZIDE XL 5 MG 24 hr tablet Take 1 tablet by mouth daily.      [name] glucose blood (ONE TOUCH ULTRA TEST) test strip 1 each by Other route as needed. Use as instructed Check blood sugar daily and as needed       . ipratropium (ATROVENT) 0.02 % nebulizer solution Take 500 mcg by nebulization 3 (three) times daily.      . lansoprazole (PREVACID) 15 MG capsule Take 15 mg by mouth daily.      [name] levalbuterol (XOPENEX) 0.63 MG/3ML nebulizer solution Take 1 ampule by nebulization 3 (three) times daily.      . metFORMIN (GLUCOPHAGE) 500 MG tablet Take 500 mg by mouth 2 (two) times daily before a meal.      . potassium chloride SA (K-DUR,KLOR-CON) 20 MEQ tablet Take 20 mEq by mouth daily.            Review of Systems Review of Systems  Constitutional: Negative for fever, appetite change, fatigue and unexpected weight change.  Eyes: Negative for pain and visual disturbance.  Respiratory: Negative for cough and pos for baseline sob from copd  (poor  endurance) Cardiovascular: Negative for cp or  palpitations    Gastrointestinal: Negative for nausea, diarrhea and constipation.  Genitourinary: Negative for urgency and frequency.  Skin: Negative for pallor or rash   Neurological: Negative for weakness, light-headedness, numbness and headaches. pos for generally poor balance but neg for focal weakness Hematological: Negative for adenopathy. Does not bruise/bleed easily.  Psychiatric/Behavioral: Negative for dysphoric mood. The patient is not nervous/anxious.         Objective:   Physical Exam  Constitutional: She appears well-developed and well-nourished. No distress.  Frail appearing elderly female wearing oxygen  HENT:  Head: Normocephalic and atraumatic.  Eyes: Conjunctivae and EOM are normal. Pupils are equal, round, and reactive to light.  Neck: Normal range of motion. Neck supple. No JVD present. No thyromegaly present.  Cardiovascular: Normal rate and regular rhythm.   Pulmonary/Chest: Effort normal and breath sounds normal. No respiratory distress. She has no wheezes.  Diffusely distant bs   Lymphadenopathy:    She has no cervical adenopathy.  Neurological: She is alert. She displays tremor. No cranial nerve deficit. She exhibits normal muscle tone. Coordination normal.  Oriented times 1 Slow wide based gait   Skin: Skin is warm and dry. No erythema. No pallor.  Psychiatric: Thought content is not delusional. Cognition and memory are impaired. She exhibits abnormal recent memory.  MMS 19/30 score today  Concentration seems better than memory Not seemingly confused Pleasant/not agitated today Does at times become argumentative with family She is inattentive.          Assessment & Plan:

## 2012-08-07 NOTE — Patient Instructions (Addendum)
Start aricept to slow down your memory loss  It may be a good time to pick out a place to live in the future that is safe for you- in light of your memory/ dementia problems Designate a power of attourney and living will - and you may need a lawyer to help with this  Follow up with me in about 2 months - to see how the aricept is working and increase dose if needed  You should not be driving at all

## 2012-08-09 NOTE — Assessment & Plan Note (Signed)
In elderly pt with copd/ sleep apnea and other chronic diseases  This is progressive since last hosp and family is overwhelmed with her care  MMS 19/30 today - pt is not correctly oriented- she has wandered and become lost also Will start aricept 5 - to titrate to 10 at next visit if tol wel - disc goals of slowing down memory loss  Disc imp of brain activity/ puzzles/reading Long disc about safety/ need for POA and option of assisted living, which I recommend  Family will work on all of this  I did also recommend she no longer drive  >96 min spent with face to face with patient, >50% counseling and/or coordinating care

## 2012-08-11 ENCOUNTER — Telehealth: Payer: Self-pay | Admitting: Internal Medicine

## 2012-08-11 NOTE — Telephone Encounter (Signed)
I spoke with pt son. He stated [facility] never receive RX for pulmicort from 07/21/12. [facility]. I gave verbal order for this. Attempt to call pt son to make him aware of this. NA and unable to leave [facility]

## 2012-08-12 NOTE — Telephone Encounter (Signed)
I spoke with pt son and is aware of rx was phone in. Nothing further was needed

## 2012-08-18 ENCOUNTER — Telehealth: Payer: Self-pay | Admitting: Family Medicine

## 2012-08-18 NOTE — Telephone Encounter (Signed)
Recent med list and last OV note faxed to Duwayne Heck, Charity fundraiser at [facility].  Fax# [phone].  Phone # [phone] ext [phone].

## 2012-08-26 ENCOUNTER — Ambulatory Visit (INDEPENDENT_AMBULATORY_CARE_PROVIDER_SITE_OTHER): Payer: Medicare Other | Admitting: Pulmonary Disease

## 2012-08-26 ENCOUNTER — Encounter: Payer: Self-pay | Admitting: Pulmonary Disease

## 2012-08-26 VITALS — BP 96/60 | HR 105 | Temp 97.9°F | Ht 66.0 in | Wt 137.4 lb

## 2012-08-26 DIAGNOSIS — G4733 Obstructive sleep apnea (adult) (pediatric): Secondary | ICD-10-CM

## 2012-08-26 NOTE — Assessment & Plan Note (Signed)
The patient feels that she is better since wearing CPAP, but is having significant issues with mask fit.  She will obviously need a new CPAP mask, and we will also need to optimize her pressure on the automatic setting.  She also wishes to be referred to a new medical equipment company.

## 2012-08-26 NOTE — Patient Instructions (Addendum)
Will get you referred to a new dme.  We need to get you a better fitting mask, and also optimize your pressure. If doing well, will see you back in 6mos.  Please call me if you continue to have issues.

## 2012-08-26 NOTE — Progress Notes (Signed)
  Subjective:    Patient ID: [name], female    DOB: 1930/01/23, 79 y.o.   MRN: [id number]  HPI Patient comes in today for followup of her obstructive sleep apnea.  The patient has been wearing her CPAP compliantly, but has had issues with her mask fit.  She now has a pressure sore on her bridge, and feels the mass is not the right fit.  She does feel like her sleep is better, as is her daytime alertness since being on CPAP.  She also never got her heated humidifier.  She is complaining of very poor service from advanced home care.   Review of Systems  Constitutional: Negative for fever and unexpected weight change.  HENT: Positive for rhinorrhea. Negative for ear pain, nosebleeds, congestion, sore throat, sneezing, trouble swallowing, dental problem, postnasal drip and sinus pressure.   Eyes: Negative for redness and itching.  Respiratory: Positive for shortness of breath. Negative for cough, chest tightness and wheezing.   Cardiovascular: Negative for palpitations and leg swelling.  Gastrointestinal: Positive for nausea, vomiting ( x 1 day ago) and diarrhea ( x 1 day ago).  Genitourinary: Positive for frequency. Negative for dysuria.  Musculoskeletal: Negative for joint swelling.  Skin: Negative for rash.  Neurological: Positive for headaches ( in the mornings).  Hematological: Does not bruise/bleed easily.  Psychiatric/Behavioral: Negative for dysphoric mood. The patient is not nervous/anxious.        Objective:   Physical Exam Wd female in nad Mild excoriation over bridge of nose, but no necrosis Nose without purulence or discharge noted. Neck without LN or TMG Lower extremities with mild edema, no cyanosis Alert and oriented, moves all 4 extremities.       Assessment & Plan:

## 2012-09-01 ENCOUNTER — Telehealth: Payer: Self-pay | Admitting: Family Medicine

## 2012-09-01 NOTE — Telephone Encounter (Signed)
Does she think the aricept is causing some of this ? - it can cause nausea --that could contribute to wt loss and eating

## 2012-09-01 NOTE — Telephone Encounter (Signed)
**Note De-Identified  Obfuscation** [name], pt's daughter calling to give update on pt.  Says she is having difficulty eating, losing weight, and is staying in her bedroom most of the day.  She has an appt on Thursday, 09/03/12.  [name] wanted to know if she needed to move the appointment up sooner and is requesting call back.

## 2012-09-01 NOTE — Telephone Encounter (Signed)
Pt's daughter doesn't think its the aricept because pt hasn't complained of any nausea, she just says she has already eaten when she hasn't and refuses to drink the ensures because she says they are to sweet, pt only had a b12 inj appt on 09/03/12 and her f/u appt with you wasn't until 09/14/12, pt's daughter didn't want to wait they long so we rescheduled her f/u appt to 09/04/12 and she will just get her b12 injection at that appt

## 2012-09-01 NOTE — Telephone Encounter (Signed)
That is fine- I will see her then - just encourage her to eat whatever she will and will discuss further , also try to encourage her to have some social contact (that may be a struggle) Thanks for the update

## 2012-09-03 ENCOUNTER — Ambulatory Visit: Payer: Medicare Other

## 2012-09-04 ENCOUNTER — Ambulatory Visit: Payer: Medicare Other | Admitting: Family Medicine

## 2012-09-08 ENCOUNTER — Ambulatory Visit (INDEPENDENT_AMBULATORY_CARE_PROVIDER_SITE_OTHER): Payer: Medicare Other | Admitting: Family Medicine

## 2012-09-08 ENCOUNTER — Encounter: Payer: Self-pay | Admitting: Family Medicine

## 2012-09-08 VITALS — BP 118/68 | HR 63 | Temp 98.4°F

## 2012-09-08 DIAGNOSIS — F0390 Unspecified dementia without behavioral disturbance: Secondary | ICD-10-CM

## 2012-09-08 MED ORDER — DONEPEZIL HCL 10 MG PO TABS: 10.0000 mg | ORAL_TABLET | Freq: Every evening | ORAL | Status: DC | PRN

## 2012-09-08 MED ORDER — CYANOCOBALAMIN 1000 MCG/ML IJ SOLN
1000.0000 ug | Freq: Once | INTRAMUSCULAR | Status: AC
  Administered 2012-09-08: 1000 ug via INTRAMUSCULAR

## 2012-09-08 NOTE — Assessment & Plan Note (Signed)
Overall stable - still agitated at times but seemed quite lucid at times [facility] for improvement with cpap and better oxygenation also  Is loosing appetite- dementia or even depression could play a role in this - will watch closely- disc meals and nutrition in detail Increase aricept to 10 mg if tolerated (family will watch for nausea or other side eff) F/u 6 weeks Family is working on [facility] status and living will as well

## 2012-09-08 NOTE — Progress Notes (Signed)
Subjective:    Patient ID: DEMYAH SMYRE, female    DOB: Jun 06, 1930, 79 y.o.   MRN: [id number]  HPI Here for f/u of dementia and B12 def and other chronic problems   Chronic pain issues  Knees and back  Headache some of the time (working on adjusting cpap)- and adjusting settings -pending new mask and humidification  Is waiting on new mask and equiptment Sees Dr Shelle Iron   Needs B12 shot Lab Results  Component Value Date   VITAMINB12 146* 08/07/2012   Started aricept 5 mg at last visit  Family was going to approach issue of power of attourney and placement if needed  They are working on all that- she has had several meetings with a lawyer Side eff  Not eating Wt 129 (down from 144 since hosp) Refuses ensure since it is too sweet Not much appetite-pt denies that she has nausea  DM Lab Results  Component Value Date   HGBA1C 7.4* 06/16/2012     Patient Active Problem List  Diagnosis  . HYPERCHOLESTEROLEMIA  . ANEMIA  . Former smoker  . MORTON'S NEUROMA  . MACULAR DEGENERATION  . HYPERTENSION  . Atrial fibrillation  . ALLERGIC RHINITIS  . OSTEOPOROSIS  . DIABETES MELLITUS, TYPE II  . UNSPECIFIED VITAMIN D DEFICIENCY  . Mixed incontinence urge and stress  . Dementia  . Acute diastolic CHF (congestive heart failure)  . COPD (chronic obstructive pulmonary disease)  . Acute and chronic respiratory failure  . Esophageal dysmotility suspected  . GERD (gastroesophageal reflux disease)  . Chest pain  . PE (pulmonary embolism)  . Urine frequency  . OSA (obstructive sleep apnea)  . Hypertrophic toenail  . Vitamin B12 deficiency   Past Medical History  Diagnosis Date  . Allergy     allergic Rhintis  . Hyperlipidemia   . Hypertension   . Osteoporosis   . PAF (paroxysmal atrial fibrillation)     a. PAF dates back > 5 yrs, prev on coumadin->d/c 2006 2/2 GIB;   . Carotid stenosis     a. 09/2007: Carotid Doppler stable- stable bilateral stenosis 40-59% (from 06)  .  Type II diabetes mellitus   . Tobacco abuse     a. ongoing - 50+ pack yr hx.  . Dementia     Dementia in Hospital //Does O.K. at home.  . Diverticulosis   . GI bleeding     a. when on coumadin in 2006 - colonoscopy @ that time showed 2 large right colon avm's, multiple small bowel avm's, fresh blood in prox small bowel.  . AVM (arteriovenous malformation) of colon     a. 2006 colonscopy  . Chronic respiratory failure   . COPD (chronic obstructive pulmonary disease)   . Chronic diastolic CHF (congestive heart failure)   . Normocytic anemia 03/2012  . Complication of anesthesia   . PONV (postoperative nausea and vomiting)   . Shortness of breath   . Pneumonia     MULTIPLE TIMES"  . Heart murmur   . GERD (gastroesophageal reflux disease)    Past Surgical History  Procedure Laterality Date  . Cardiac catheterization  1990's    clear  . Orif tibia & fibula fractures  05/2004  . Small bowel enteroscopy  03/2005    AVMS  . Colonoscopy  08/2004  . Esophagogastroduodenoscopy  08/2004    Multiple    History  Substance Use Topics  . Smoking status: Former Smoker -- 0.50 packs/day for 50 years  Types: Cigarettes    Quit date: 04/21/2012  . Smokeless tobacco: Never Used  . Alcohol Use: No   Family History  Problem Relation Age of Onset  . Stroke Mother     a. believes she died suddenly @ 92.  . Diabetes Mother   . Stroke Father     Died from cerebral hemorrhage @ 66  . Diabetes Father   . Cancer Sister     colon and stomach cancer  . Diabetes Brother   . Heart disease Brother   . Cancer Brother     died from lung cancer  . Heart disease Brother     MI  . Cancer Sister     ? liver//brain cancer  . Heart disease Sister   . Diabetes Sister     diabetes and glaucoma  . Cancer Brother     pancreatic cancer  . Diabetes Son    Allergies  Allergen Reactions  . Azithromycin     REACTION: inc blood sugar  . Codeine Itching  . Coumadin (Warfarin) Other (See Comments)     Causes bleeding  . Oxybutynin Chloride Er     Constipation/ dizziness/ dry mouth   Current Outpatient Prescriptions on File Prior to Visit  Medication Sig Dispense Refill  . aspirin 81 MG tablet Take 81 mg by mouth daily.        [name] atorvastatin (LIPITOR) 10 MG tablet Take 10 mg by mouth daily.      . Budesonide 90 MCG/ACT inhaler Inhale 2 puffs into the lungs 2 (two) times daily.  2 Inhaler  5  . Cholecalciferol (VITAMIN D-3) 1000 UNITS CAPS Take 1 capsule by mouth daily.      . digoxin (LANOXIN) 0.125 MG tablet Take 1 tablet (0.125 mg total) by mouth daily.  30 tablet  5  . diltiazem (CARDIZEM CD) 240 MG 24 hr capsule Take 240 mg by mouth daily.      . fluticasone (FLONASE) 50 MCG/ACT nasal spray Place 2 sprays into the nose daily as needed. For allergies      . furosemide (LASIX) 40 MG tablet Take 0.5-1 tablets (20-40 mg total) by mouth daily. 20 mg daily 40 mg if gains 3 lbs or more.  30 tablet  5  . GLIPIZIDE XL 5 MG 24 hr tablet Take 1 tablet by mouth daily.      [name] glucose blood (ONE TOUCH ULTRA TEST) test strip 1 each by Other route as needed. Use as instructed Check blood sugar daily and as needed       . ipratropium (ATROVENT) 0.02 % nebulizer solution Take 500 mcg by nebulization 3 (three) times daily.      . lansoprazole (PREVACID) 15 MG capsule Take 15 mg by mouth daily.      [name] levalbuterol (XOPENEX) 0.63 MG/3ML nebulizer solution Take 1 ampule by nebulization 3 (three) times daily.      . metFORMIN (GLUCOPHAGE) 500 MG tablet Take 500 mg by mouth 2 (two) times daily before a meal.      . potassium chloride SA (K-DUR,KLOR-CON) 20 MEQ tablet Take 20 mEq by mouth daily.       No current facility-administered medications on file prior to visit.    Review of Systems Review of Systems  Constitutional: Negative for fever, appetite change, and unexpected weight change.  Eyes: Negative for pain and visual disturbance.  Respiratory: Negative for cough and pos for baseline  sob Cardiovascular: Negative for cp or palpitations    Gastrointestinal:  Negative for nausea, diarrhea and constipation.  Genitourinary: Negative for urgency and frequency.  Skin: Negative for pallor or rash   Neurological: Negative for weakness, light-headedness, numbness and headaches.  Hematological: Negative for adenopathy. Does not bruise/bleed easily.  Psychiatric/Behavioral:intermittently pos  for dysphoric mood. The patient is intermittently anxious and agitated          Objective:   Physical Exam  Constitutional: She appears well-developed and well-nourished. No distress.  HENT:  Head: Normocephalic and atraumatic.  Mouth/Throat: Oropharynx is clear and moist.  Eyes: Conjunctivae and EOM are normal. Pupils are equal, round, and reactive to light. Right eye exhibits no discharge. Left eye exhibits no discharge. No scleral icterus.  Neck: Normal range of motion. Neck supple. No JVD present. Carotid bruit is not present. No thyromegaly present.  Cardiovascular: Normal rate, regular rhythm, normal heart sounds and intact distal pulses.  Exam reveals no gallop.   Pulmonary/Chest: Effort normal and breath sounds normal. No respiratory distress. She has no wheezes.  Diffusely distant bs  Fair air exch  Abdominal: Soft. Bowel sounds are normal. She exhibits no distension, no abdominal bruit and no mass. There is no tenderness.  Musculoskeletal: She exhibits no edema and no tenderness.  Lymphadenopathy:    She has no cervical adenopathy.  Neurological: She is alert. She has normal reflexes. She displays tremor. No cranial nerve deficit. She exhibits normal muscle tone. Coordination normal.  Skin: Skin is warm and dry. No rash noted. No pallor.  Psychiatric: She has a normal mood and affect.  Fairly lucid and talkative today          Assessment & Plan:

## 2012-09-08 NOTE — Assessment & Plan Note (Signed)
First injection today - plan is one weekly for 4 weeks or until we run out due to the shortage and then re check level Hope this will help memory issues and energy level  Nurse visit appt made for 1 week for next shot  Disc eating a balanced diet  Hopefully will be able to orally supplement in the future

## 2012-09-08 NOTE — Patient Instructions (Addendum)
Try glucerna if that tastes better to you  Peanut butter is a good meal subs if you are not hungry  Protein powder is also great  Encourage more social interaction Increase the aricept to 10 mg if tolerated well  Let me know if the headache does not improve with the change in cpap  B 12 shot Schedule nurse visit for B12 shot in 1 week (if we have any left at that point)  Change visit with me on Monday to that nurse visit instead  Follow up with me in 6 weeks

## 2012-09-14 ENCOUNTER — Ambulatory Visit: Payer: Medicare Other | Admitting: Family Medicine

## 2012-09-15 ENCOUNTER — Ambulatory Visit: Payer: Medicare Other

## 2012-09-17 ENCOUNTER — Ambulatory Visit (INDEPENDENT_AMBULATORY_CARE_PROVIDER_SITE_OTHER): Payer: Medicare Other | Admitting: *Deleted

## 2012-09-17 MED ORDER — CYANOCOBALAMIN 1000 MCG/ML IJ SOLN
1000.0000 ug | Freq: Once | INTRAMUSCULAR | Status: AC
  Administered 2012-09-17: 1000 ug via INTRAMUSCULAR

## 2012-09-18 ENCOUNTER — Telehealth: Payer: Self-pay | Admitting: Family Medicine

## 2012-09-18 NOTE — Telephone Encounter (Signed)
Patient Information:  Caller Name: [name]  Phone: [phone]  Patient: Lauren, Morgan  Gender: Female  DOB: 1930-04-09  Age: 79 Years  PCP: Roxy Manns [facility])  Office Follow Up:  Does the office need to follow up with this patient?: Yes  Instructions For The Office: The son would like a callback from MD if possible.   Symptoms  Reason For Call & Symptoms: [name]/Son is calling to let Dr. Milinda Antis know that his Mom had a "bad episode" this am. She has dementia and stays in her bedroom usually. She was in the office yesterday for a B12 injection. Pt used to be interactive but has gradually (since the last visit 2 weeks ago) become reclusive. She woke up this am and refused foods and her meds. Her son has gotten her to take the pills now and eaten and is back in her bedroom. Son is wanting to know over the w/e if she refuses food and meds what is his best recourse? Should her force her or does she have the right to refuse. He would like to speak with MD if possible regarding this to understand is role better.  Reviewed Health History In EMR: Yes  Reviewed Medications In EMR: Yes  Reviewed Allergies In EMR: Yes  Reviewed Surgeries / Procedures: Yes  Date of Onset of Symptoms: 09/18/2012  Guideline(s) Used:  No Protocol Available - Information Only  Disposition Per Guideline:   Discuss with PCP and Callback by Nurse Today  Reason For Disposition Reached:   Nursing judgment  Advice Given:  Call Back If:  New symptoms develop  You become worse.  Patient Will Follow Care Advice:  YES

## 2012-09-21 NOTE — Telephone Encounter (Signed)
Spoke with her son- was able to get a living will with DNR status and legally things are organized now  Pt's confusion waxes and wanes- they are looking for a facility in future to better assure safety Cannot force her to eat/ drink or take med-aware of that - disc supportive care along with opt of neuro or psych consult in future and they will disc that as a family  If they feel she is medically unstable 2ndary to ref of med -will let me know  Aware that hospice would be the last step  Will see how she does with aricept and also B12 shots

## 2012-09-21 NOTE — Telephone Encounter (Signed)
Left message with voicemail- will try again later

## 2012-09-25 ENCOUNTER — Encounter: Payer: Self-pay | Admitting: Family Medicine

## 2012-09-25 ENCOUNTER — Ambulatory Visit (INDEPENDENT_AMBULATORY_CARE_PROVIDER_SITE_OTHER): Payer: Medicare Other | Admitting: Family Medicine

## 2012-09-25 VITALS — BP 130/64 | HR 71 | Temp 97.1°F | Ht 66.0 in | Wt 134.2 lb

## 2012-09-25 DIAGNOSIS — R109 Unspecified abdominal pain: Secondary | ICD-10-CM

## 2012-09-25 DIAGNOSIS — E538 Deficiency of other specified B group vitamins: Secondary | ICD-10-CM

## 2012-09-25 DIAGNOSIS — R1030 Lower abdominal pain, unspecified: Secondary | ICD-10-CM | POA: Insufficient documentation

## 2012-09-25 DIAGNOSIS — F0390 Unspecified dementia without behavioral disturbance: Secondary | ICD-10-CM

## 2012-09-25 DIAGNOSIS — R627 Adult failure to thrive: Secondary | ICD-10-CM | POA: Insufficient documentation

## 2012-09-25 DIAGNOSIS — F329 Major depressive disorder, single episode, unspecified: Secondary | ICD-10-CM

## 2012-09-25 MED ORDER — CYANOCOBALAMIN 1000 MCG/ML IJ SOLN
1000.0000 ug | Freq: Once | INTRAMUSCULAR | Status: AC
  Administered 2012-09-25: 1000 ug via INTRAMUSCULAR

## 2012-09-25 MED ORDER — SERTRALINE HCL 50 MG PO TABS: 50.0000 mg | ORAL_TABLET | Freq: Every day | ORAL | Status: DC

## 2012-09-25 NOTE — Patient Instructions (Addendum)
Think about a hernia belt or girdle and let me know if the abdominal problem persists  Keep working on legal issues and search for facility  Schedule nurse visit in 1-2 weeks for a B12 shot Continue the aricept  Try zoloft 50 mg , start with 1/2 pill once daily in evening for 2 weeks and then go up to 1 whole pill- stop if side effects or worse

## 2012-09-25 NOTE — Progress Notes (Signed)
Subjective:    Patient ID: [name], female    DOB: [birth year], 79 y.o.   MRN: [id number]  HPI Here for f/u of dementia and also for B12 inj and also to discuss a fall    Per phone call this week- pt has more disorientation later in the day (good in ams) , more agitation and still wants to stay shut in her room At times refuses to eat On aricept 10 No side effects - no nausea  Wt is down 3 lb  Eats a good breakfast and a fair dinner  Mixing glucerna with protein powder and milk   Breathing has been better Not sleeping well overall   Mood has been bad - she gets irritable and angry at times Good and bad days    B12 injection - 3rd one   She did roll out of her bed - and did not injure herself - happened reaching for her glasses  Now props a pillow on the edge   There is an area on R abdomen- she was worried about hernia  ? If there is a lump there  It bothers her at times No n/v or other symptoms   Patient Active Problem List  Diagnosis  . HYPERCHOLESTEROLEMIA  . ANEMIA  . Former smoker  . MORTON'S NEUROMA  . MACULAR DEGENERATION  . HYPERTENSION  . Atrial fibrillation  . ALLERGIC RHINITIS  . OSTEOPOROSIS  . DIABETES MELLITUS, TYPE II  . UNSPECIFIED VITAMIN D DEFICIENCY  . Mixed incontinence urge and stress  . Dementia  . Acute diastolic CHF (congestive heart failure)  . COPD (chronic obstructive pulmonary disease)  . Acute and chronic respiratory failure  . Esophageal dysmotility suspected  . GERD (gastroesophageal reflux disease)  . Chest pain  . PE (pulmonary embolism)  . Urine frequency  . OSA (obstructive sleep apnea)  . Hypertrophic toenail  . Vitamin B12 deficiency  . Failure to thrive in adult   Past Medical History  Diagnosis Date  . Allergy     allergic Rhintis  . Hyperlipidemia   . Hypertension   . Osteoporosis   . PAF (paroxysmal atrial fibrillation)     a. PAF dates back > 5 yrs, prev on coumadin->d/c 2006 2/2 GIB;   . Carotid  stenosis     a. 09/2007: Carotid Doppler stable- stable bilateral stenosis 40-59% (from 06)  . Type II diabetes mellitus   . Tobacco abuse     a. ongoing - 50+ pack yr hx.  . Dementia     Dementia in Hospital //Does O.K. at home.  . Diverticulosis   . GI bleeding     a. when on coumadin in 2006 - colonoscopy @ that time showed 2 large right colon avm's, multiple small bowel avm's, fresh blood in prox small bowel.  . AVM (arteriovenous malformation) of colon     a. 2006 colonscopy  . Chronic respiratory failure   . COPD (chronic obstructive pulmonary disease)   . Chronic diastolic CHF (congestive heart failure)   . Normocytic anemia 03/2012  . Complication of anesthesia   . PONV (postoperative nausea and vomiting)   . Shortness of breath   . Pneumonia     MULTIPLE TIMES"  . Heart murmur   . GERD (gastroesophageal reflux disease)    Past Surgical History  Procedure Laterality Date  . Cardiac catheterization  1990's    clear  . Orif tibia & fibula fractures  05/2004  . Small bowel enteroscopy  03/2005    AVMS  . Colonoscopy  08/2004  . Esophagogastroduodenoscopy  08/2004    Multiple    History  Substance Use Topics  . Smoking status: Former Smoker -- 0.50 packs/day for 50 years    Types: Cigarettes    Quit date: 04/21/2012  . Smokeless tobacco: Never Used  . Alcohol Use: No   Family History  Problem Relation Age of Onset  . Stroke Mother     a. believes she died suddenly @ 41.  . Diabetes Mother   . Stroke Father     Died from cerebral hemorrhage @ 70  . Diabetes Father   . Cancer Sister     colon and stomach cancer  . Diabetes Brother   . Heart disease Brother   . Cancer Brother     died from lung cancer  . Heart disease Brother     MI  . Cancer Sister     ? liver//brain cancer  . Heart disease Sister   . Diabetes Sister     diabetes and glaucoma  . Cancer Brother     pancreatic cancer  . Diabetes Son    Allergies  Allergen Reactions  . Azithromycin      REACTION: inc blood sugar  . Codeine Itching  . Coumadin (Warfarin) Other (See Comments)    Causes bleeding  . Oxybutynin Chloride Er     Constipation/ dizziness/ dry mouth   Current Outpatient Prescriptions on File Prior to Visit  Medication Sig Dispense Refill  . aspirin 81 MG tablet Take 81 mg by mouth daily.        [name] atorvastatin (LIPITOR) 10 MG tablet Take 10 mg by mouth daily.      . Budesonide 90 MCG/ACT inhaler Inhale 2 puffs into the lungs 2 (two) times daily.  2 Inhaler  5  . Cholecalciferol (VITAMIN D-3) 1000 UNITS CAPS Take 1 capsule by mouth daily.      . digoxin (LANOXIN) 0.125 MG tablet Take 1 tablet (0.125 mg total) by mouth daily.  30 tablet  5  . diltiazem (CARDIZEM CD) 240 MG 24 hr capsule Take 240 mg by mouth daily.      [name] donepezil (ARICEPT) 10 MG tablet Take 1 tablet (10 mg total) by mouth at bedtime as needed.  30 tablet  11  . fluticasone (FLONASE) 50 MCG/ACT nasal spray Place 2 sprays into the nose daily as needed. For allergies      . furosemide (LASIX) 40 MG tablet Take 0.5-1 tablets (20-40 mg total) by mouth daily. 20 mg daily 40 mg if gains 3 lbs or more.  30 tablet  5  . GLIPIZIDE XL 5 MG 24 hr tablet Take 1 tablet by mouth daily.      [name] glucose blood (ONE TOUCH ULTRA TEST) test strip 1 each by Other route as needed. Use as instructed Check blood sugar daily and as needed       . ipratropium (ATROVENT) 0.02 % nebulizer solution Take 500 mcg by nebulization 3 (three) times daily.      . lansoprazole (PREVACID) 15 MG capsule Take 15 mg by mouth daily.      [name] levalbuterol (XOPENEX) 0.63 MG/3ML nebulizer solution Take 1 ampule by nebulization 3 (three) times daily.      . metFORMIN (GLUCOPHAGE) 500 MG tablet Take 500 mg by mouth 2 (two) times daily before a meal.      . potassium chloride SA (K-DUR,KLOR-CON) 20 MEQ tablet Take 20 mEq by  mouth daily.       No current facility-administered medications on file prior to visit.     Review of Systems Review of  Systems  Constitutional: Negative for fever, appetite change,  and unexpected weight change. pos for loss of appetite Eyes: Negative for pain and visual disturbance.  Respiratory: Negative for cough and pos for baseline sob on exertion Cardiovascular: Negative for cp or palpitations    Gastrointestinal: Negative for nausea, diarrhea and constipation.  Genitourinary: Negative for urgency and frequency.  Skin: Negative for pallor or rash   Neurological: Negative for weakness, light-headedness, numbness and pos for occas headache Hematological: Negative for adenopathy. Does not bruise/bleed easily.  Psychiatric/Behavioral: pos for dysphoric mood. The patient is not nervous/anxious.  pos for dementia with labile confusion and short term memory loss        Objective:   Physical Exam  Constitutional: She appears well-developed and well-nourished. No distress.  HENT:  Head: Normocephalic and atraumatic.  Mouth/Throat: Oropharynx is clear and moist.  Eyes: Conjunctivae and EOM are normal. Pupils are equal, round, and reactive to light. Right eye exhibits no discharge. Left eye exhibits no discharge. No scleral icterus.  Neck: Normal range of motion. Neck supple. No JVD present. Carotid bruit is not present. No thyromegaly present.  Cardiovascular: Normal rate, regular rhythm, normal heart sounds and intact distal pulses.   Pulmonary/Chest: Effort normal and breath sounds normal. No respiratory distress. She has no wheezes.  Diffusely distant bs   Abdominal: Soft. Bowel sounds are normal. She exhibits no distension, no abdominal bruit and no mass. There is no tenderness. There is no rebound and no guarding.  No abdominal masses or obvious hernia noted  Musculoskeletal: She exhibits no tenderness.  Lymphadenopathy:    She has no cervical adenopathy.  Neurological: She is alert. She has normal reflexes. No cranial nerve deficit. She exhibits normal muscle tone. Coordination normal.  Mild hand  tremor   Skin: Skin is warm and dry. No rash noted. No erythema. No pallor.  Psychiatric: Her behavior is normal. Her mood appears anxious. Her affect is labile. Her speech is not tangential. Cognition and memory are impaired. She exhibits abnormal recent memory.  Talkative and tangential today          Assessment & Plan:

## 2012-09-27 NOTE — Assessment & Plan Note (Signed)
Again rev need for protien in diet regularly and need to eat regular meals Pt has poor appetite- family unable to force her to eat Will continue to follow

## 2012-09-27 NOTE — Assessment & Plan Note (Signed)
Shot today Last one in series of 4 in 1-2 weeks and then re check level at f/u in April Hope this will help memory and energy level

## 2012-09-27 NOTE — Assessment & Plan Note (Signed)
This is worsening in face of dementia Trial of zoloft 25- titrate to 50 Will watch for side eff or hyponatremia or worsening of symptoms  Family will update  F/u in about a month

## 2012-09-27 NOTE — Assessment & Plan Note (Signed)
Nl exam today  Pt wonders about hernia  Disc use of hernia belt or girdle if needed-will continue to watch  Disc s/s to watch for -pain or bulge

## 2012-09-27 NOTE — Assessment & Plan Note (Signed)
Tolerating aricept 10 Feel she would be safer in a facility at this time- family agrees/ still working out legal issues currently but pt does have living will and POA

## 2012-09-29 ENCOUNTER — Telehealth: Payer: Self-pay | Admitting: Family Medicine

## 2012-09-29 NOTE — Telephone Encounter (Signed)
Patient Information:  Caller Name: [name]  Phone: [phone]  Patient: [name], [name]  Gender: Female  DOB: Jun 09, 1930  Age: 79 Years  PCP: Roxy Manns [facility])  Office Follow Up:  Does the office need to follow up with this patient?: No  Instructions For The Office: N/A  RN Note:  Called for appointment after ED visit 09/28/12. Fainted while dressing  09/28/12 at 0920. No symptoms 09/29/12. Urine panel, CT of head, CXR, cardiac enzymes and basic blood panel were negative. FBS 136 yesterday and 132 09/29/12.  EKG showed (chronic) A-fib. O2 sat 97% this morning. No appointments remain for 09/29/12. RN was going to send message to [facility] for possible work in appointment 09/29/12, but due to travel distance, [name] requested appoinment be scheduled for 09/30/12.  Notiffied office scheduler/Carrie.   Symptoms  Reason For Call & Symptoms: Called for appointment for follow up from ED visit 09/28/12 for syncope.  All tests negative. Not given any medication.  Instructed to follow up with PCP ASAP.  Reviewed Health History In EMR: Yes  Reviewed Medications In EMR: Yes  Reviewed Allergies In EMR: Yes  Reviewed Surgeries / Procedures: Yes  Date of Onset of Symptoms: 09/28/2012  Guideline(s) Used:  Fainting  Disposition Per Guideline:   See Today in Office  Reason For Disposition Reached:   All other patients, and now alert and feels fine (Exception: SIMPLE FAINT due to stress, pain, prolonged standing, or suddenly standing)  Advice Given:  N/A  Patient Will Follow Care Advice:  YES

## 2012-09-29 NOTE — Telephone Encounter (Signed)
I will see her then  

## 2012-09-30 ENCOUNTER — Encounter: Payer: Self-pay | Admitting: Family Medicine

## 2012-09-30 ENCOUNTER — Ambulatory Visit (INDEPENDENT_AMBULATORY_CARE_PROVIDER_SITE_OTHER): Payer: Medicare Other | Admitting: Family Medicine

## 2012-09-30 VITALS — BP 110/70 | HR 78 | Temp 98.3°F | Ht 66.0 in | Wt 124.5 lb

## 2012-09-30 DIAGNOSIS — E538 Deficiency of other specified B group vitamins: Secondary | ICD-10-CM

## 2012-09-30 DIAGNOSIS — R51 Headache: Secondary | ICD-10-CM

## 2012-09-30 DIAGNOSIS — R519 Headache, unspecified: Secondary | ICD-10-CM | POA: Insufficient documentation

## 2012-09-30 DIAGNOSIS — F0390 Unspecified dementia without behavioral disturbance: Secondary | ICD-10-CM

## 2012-09-30 DIAGNOSIS — R55 Syncope and collapse: Secondary | ICD-10-CM | POA: Insufficient documentation

## 2012-09-30 MED ORDER — CYANOCOBALAMIN 1000 MCG/ML IJ SOLN
1000.0000 ug | Freq: Once | INTRAMUSCULAR | Status: AC
  Administered 2012-09-30: 1000 ug via INTRAMUSCULAR

## 2012-09-30 NOTE — Assessment & Plan Note (Signed)
With recent pre syncope Imp some with stopping humidification of 02 for cpap CT nl  Neuro ref

## 2012-09-30 NOTE — Assessment & Plan Note (Signed)
On aricept Also treating depression Poor appetite-fail to thrive  Ref to neuro

## 2012-09-30 NOTE — Assessment & Plan Note (Signed)
See HPI- rev ER records and studies in detail Suspect vasovagal  Issues with po intake- but no hypotension or hypoglycemia or dehydration  Will be ref to neurol for this and dementia and headaches

## 2012-09-30 NOTE — Assessment & Plan Note (Signed)
4th B12 shot today Will get level at end of mo and make plan

## 2012-09-30 NOTE — Patient Instructions (Addendum)
We will do a neurology referral at check out  Try to work as hard as you can to encourage regular meals and fluids  If any more incidents please let us know

## 2012-09-30 NOTE — Progress Notes (Signed)
Subjective:    Patient ID: [name], female    DOB: [birth year], 79 y.o.   MRN: [id number]  HPI Here for f/u from ER visit on 3/31 for near syncope  Records rev state -pt fell forward during voiding on toilet and did not totally loose consciousness Family corrects that -- was sitting on the bed putting on her underwear and pants - then went to lift 2nd leg to put it in her pants -- went limp but not totally unconscious - unresponsive  No seizure activity/ no jerking and no vomiting  Once EMS came (very quickly)- within 10 minutes - she was talking and acting like herself  Episode was brief and no assoc symptoms  W/u nl in ER incl CT head and sinuses (in light of c/o of ha), cxr, ua, ekg (baseline afib), cardiac enzymes, basic labs, sugar and vitals incl bp and pulse ox   Is on aricept for dementia Just started on zoloft    Wt loss of at leas 5 lb at home  Per family- it is still difficult to get her to eat  Tries to get PB crackers into her   On zoloft She did complain of more headache over the weekend  Has had more headaches lately since starting humidified cpap -- has tried no humidification for 2 days an that has improved   Patient Active Problem List  Diagnosis  . HYPERCHOLESTEROLEMIA  . ANEMIA  . Former smoker  . MORTON'S NEUROMA  . MACULAR DEGENERATION  . HYPERTENSION  . Atrial fibrillation  . ALLERGIC RHINITIS  . OSTEOPOROSIS  . DIABETES MELLITUS, TYPE II  . UNSPECIFIED VITAMIN D DEFICIENCY  . Mixed incontinence urge and stress  . Dementia  . Acute diastolic CHF (congestive heart failure)  . COPD (chronic obstructive pulmonary disease)  . Acute and chronic respiratory failure  . Esophageal dysmotility suspected  . GERD (gastroesophageal reflux disease)  . Chest pain  . PE (pulmonary embolism)  . Urine frequency  . OSA (obstructive sleep apnea)  . Hypertrophic toenail  . Vitamin B12 deficiency  . Failure to thrive in adult  . Depression  . Abdominal  pain, lower  . Near syncope   Past Medical History  Diagnosis Date  . Allergy     allergic Rhintis  . Hyperlipidemia   . Hypertension   . Osteoporosis   . PAF (paroxysmal atrial fibrillation)     a. PAF dates back > 5 yrs, prev on coumadin->d/c 2006 2/2 GIB;   . Carotid stenosis     a. 09/2007: Carotid Doppler stable- stable bilateral stenosis 40-59% (from 06)  . Type II diabetes mellitus   . Tobacco abuse     a. ongoing - 50+ pack yr hx.  . Dementia     Dementia in Hospital //Does O.K. at home.  . Diverticulosis   . GI bleeding     a. when on coumadin in 2006 - colonoscopy @ that time showed 2 large right colon avm's, multiple small bowel avm's, fresh blood in prox small bowel.  . AVM (arteriovenous malformation) of colon     a. 2006 colonscopy  . Chronic respiratory failure   . COPD (chronic obstructive pulmonary disease)   . Chronic diastolic CHF (congestive heart failure)   . Normocytic anemia 03/2012  . Complication of anesthesia   . PONV (postoperative nausea and vomiting)   . Shortness of breath   . Pneumonia     MULTIPLE TIMES"  . Heart murmur   .  GERD (gastroesophageal reflux disease)    Past Surgical History  Procedure Laterality Date  . Cardiac catheterization  1990's    clear  . Orif tibia & fibula fractures  05/2004  . Small bowel enteroscopy  03/2005    AVMS  . Colonoscopy  08/2004  . Esophagogastroduodenoscopy  08/2004    Multiple    History  Substance Use Topics  . Smoking status: Former Smoker -- 0.50 packs/day for 50 years    Types: Cigarettes    Quit date: 04/21/2012  . Smokeless tobacco: Never Used  . Alcohol Use: No   Family History  Problem Relation Age of Onset  . Stroke Mother     a. believes she died suddenly @ 75.  . Diabetes Mother   . Stroke Father     Died from cerebral hemorrhage @ 65  . Diabetes Father   . Cancer Sister     colon and stomach cancer  . Diabetes Brother   . Heart disease Brother   . Cancer Brother     died  from lung cancer  . Heart disease Brother     MI  . Cancer Sister     ? liver//brain cancer  . Heart disease Sister   . Diabetes Sister     diabetes and glaucoma  . Cancer Brother     pancreatic cancer  . Diabetes Son    Allergies  Allergen Reactions  . Azithromycin     REACTION: inc blood sugar  . Codeine Itching  . Coumadin (Warfarin) Other (See Comments)    Causes bleeding  . Oxybutynin Chloride Er     Constipation/ dizziness/ dry mouth   Current Outpatient Prescriptions on File Prior to Visit  Medication Sig Dispense Refill  . aspirin 81 MG tablet Take 81 mg by mouth daily.        [name] atorvastatin (LIPITOR) 10 MG tablet Take 10 mg by mouth daily.      . Budesonide 90 MCG/ACT inhaler Inhale 2 puffs into the lungs 2 (two) times daily.  2 Inhaler  5  . Cholecalciferol (VITAMIN D-3) 1000 UNITS CAPS Take 1 capsule by mouth daily.      . digoxin (LANOXIN) 0.125 MG tablet Take 1 tablet (0.125 mg total) by mouth daily.  30 tablet  5  . diltiazem (CARDIZEM CD) 240 MG 24 hr capsule Take 240 mg by mouth daily.      [name] donepezil (ARICEPT) 10 MG tablet Take 1 tablet (10 mg total) by mouth at bedtime as needed.  30 tablet  11  . fluticasone (FLONASE) 50 MCG/ACT nasal spray Place 2 sprays into the nose daily as needed. For allergies      . furosemide (LASIX) 40 MG tablet Take 0.5-1 tablets (20-40 mg total) by mouth daily. 20 mg daily 40 mg if gains 3 lbs or more.  30 tablet  5  . GLIPIZIDE XL 5 MG 24 hr tablet Take 1 tablet by mouth daily.      [name] glucose blood (ONE TOUCH ULTRA TEST) test strip 1 each by Other route as needed. Use as instructed Check blood sugar daily and as needed       . ipratropium (ATROVENT) 0.02 % nebulizer solution Take 500 mcg by nebulization 3 (three) times daily.      . lansoprazole (PREVACID) 15 MG capsule Take 15 mg by mouth daily.      [name] levalbuterol (XOPENEX) 0.63 MG/3ML nebulizer solution Take 1 ampule by nebulization 3 (three) times daily.      [name]  metFORMIN  (GLUCOPHAGE) 500 MG tablet Take 500 mg by mouth 2 (two) times daily before a meal.      . potassium chloride SA (K-DUR,KLOR-CON) 20 MEQ tablet Take 20 mEq by mouth daily.      . sertraline (ZOLOFT) 50 MG tablet Take 1 tablet (50 mg total) by mouth daily.  30 tablet  11   No current facility-administered medications on file prior to visit.     Review of Systems Review of Systems  Constitutional: Negative for fever, appetite change, fatigue and unexpected weight change.  Eyes: Negative for pain and visual disturbance.  Respiratory: Negative for cough and shortness of breath.   Cardiovascular: Negative for cp or palpitations    Gastrointestinal: Negative for nausea, diarrhea and constipation.  Genitourinary: Negative for urgency and frequency.  Skin: Negative for pallor or rash   Neurological: Negative for weakness, light-headedness, numbness and pos for headaches .  Hematological: Negative for adenopathy. Does not bruise/bleed easily.  Psychiatric/Behavioral: pos for depression that is stable with occ agitation, pos for occ anxiety and short term memory loss       Objective:   Physical Exam  Constitutional: She appears well-developed and well-nourished. No distress.  HENT:  Head: Normocephalic and atraumatic.  Mouth/Throat: Oropharynx is clear and moist.  Eyes: Conjunctivae and EOM are normal. Pupils are equal, round, and reactive to light. Right eye exhibits no discharge. Left eye exhibits no discharge. No scleral icterus.  Neck: Normal range of motion. Neck supple. No JVD present. Carotid bruit is not present. No thyromegaly present.  Cardiovascular: Normal rate, regular rhythm, normal heart sounds and intact distal pulses.  Exam reveals no gallop.   Pulmonary/Chest: Effort normal and breath sounds normal. No respiratory distress. She has no wheezes.  Abdominal: Soft. Bowel sounds are normal. She exhibits no distension, no abdominal bruit and no mass. There is no tenderness.   Musculoskeletal: She exhibits no edema.  Lymphadenopathy:    She has no cervical adenopathy.  Neurological: She has normal reflexes. She displays no atrophy and no tremor. No sensory deficit. Coordination normal.  Walks slowly with walker- wide based gait and balance seems fair  Skin: Skin is warm and dry. No rash noted. No erythema. No pallor.  Psychiatric: Her mood appears anxious. Her affect is labile. Her speech is tangential. She is not agitated, not aggressive, not slowed and not withdrawn. Thought content is not paranoid. Cognition and memory are impaired. She exhibits abnormal recent memory.  Pt is at times mentally sharp and towards the end of the interview rambling and tangential - family indicates this is baseline She talks about things that happened in her past Not actively paranoid today, is slt anxious but pleasant          Assessment & Plan:

## 2012-10-05 ENCOUNTER — Telehealth: Payer: Self-pay | Admitting: Family Medicine

## 2012-10-05 NOTE — Telephone Encounter (Signed)
Patient Information:  Caller Name: [name]  Phone: [phone]  Patient: Lauren, Morgan  Gender: Female  DOB: 08-31-1929  Age: 79 Years  PCP: Roxy Manns [facility])  Office Follow Up:  Does the office need to follow up with this patient?: Yes  Instructions For The Office: They would appreciate any information about finding an Assisted Living or Nursing Home.  Is there a Child psychotherapist they can access?  They have been doing research on their own.  I told them the family usually has to just do the research.  Can you please call and reinforce this or give them any information the office may have on the process.     Symptoms  Reason For Call & Symptoms: [name]. Daughter-In -Law calling that pt  has Dementia, wt loss, and poor appetite.  Dr. Milinda Antis wanted to be updated ion pt.  This AM she was very dizzy and almost fell out of her chair.  BG wass 163 and pulse Ox was 98%.   Anything that requires energy really tires the pt out.  Also how do they find out about Respite Care or Assisted Living/Nursing care placement?  Reviewed Health History In EMR: Yes  Reviewed Medications In EMR: Yes  Reviewed Allergies In EMR: Yes  Reviewed Surgeries / Procedures: Yes  Date of Onset of Symptoms: 09/04/2012  Treatments Tried: Supplement drinks with Protein powder  Treatments Tried Worked: No  Guideline(s) Used:  Weakness (Generalized) and Fatigue  Disposition Per Guideline:   Home Care  Reason For Disposition Reached:   Weakness from poor fluid intake  Advice Given:  Reassurance  Not drinking enough fluids and being a little dehydrated is a common cause of mild weakness.  Fluids  : Drink several glasses of fruit juice, other clear fluids, or water. This will improve hydration and blood glucose.  Rest  : Lie down with feet elevated for 1 hour. This will improve blood flow and increase blood flow to the brain.  Patient Will Follow Care Advice:  YES

## 2012-10-09 ENCOUNTER — Ambulatory Visit: Payer: Medicare Other

## 2012-10-12 ENCOUNTER — Telehealth: Payer: Self-pay | Admitting: *Deleted

## 2012-10-12 LAB — PROTIME-INR: INR: 1.1 (ref 0.9–1.1)

## 2012-10-12 NOTE — Telephone Encounter (Signed)
Patients daughter in law called to let you know patient in [facility].

## 2012-10-12 NOTE — Telephone Encounter (Signed)
Thanks for letting me know - does she mean the hospital? If so please send for records-thanks

## 2012-10-13 NOTE — Telephone Encounter (Signed)
Pt was at hospital, called hospital (medical records) and requested records

## 2012-10-14 ENCOUNTER — Ambulatory Visit: Payer: Medicare Other | Admitting: Neurology

## 2012-10-14 NOTE — Telephone Encounter (Signed)
Please call family and review what I just wrote-thanks

## 2012-10-14 NOTE — Telephone Encounter (Signed)
Reviewed Dr. Royden Purl comments/recommendations, [name] (daughter n law) verbalized understanding

## 2012-10-14 NOTE — Telephone Encounter (Signed)
Great-I just reviewed her discharge summary- aware they changed her zoloft to remeron (this may help her appetite hopefully-but watch for sedation with it) They d/c her glipizide in case of low blood sugar and they d/c her lasix because blood pressure was low and also decreased her digoxin dose  Sounds like work up overall was ok  Please keep track of her blood sugar and daily weights and I will see her as planned on 4/23 unless they need to change that date [facility], [place] she is feeling better!

## 2012-10-15 ENCOUNTER — Telehealth: Payer: Self-pay | Admitting: Pulmonary Disease

## 2012-10-15 ENCOUNTER — Telehealth: Payer: Self-pay | Admitting: Cardiology

## 2012-10-15 ENCOUNTER — Telehealth: Payer: Self-pay | Admitting: Neurology

## 2012-10-15 NOTE — Telephone Encounter (Signed)
Spoke with daughter-in-law...aware that these messages will be forwarded to the Dr requested. I explained to her that they are both out of office until Monday morning and we are closed tomorrow. Both messages are FYI for each Dr.   Additional Lorain Childes for MR--patient was hospitalized at [facility].

## 2012-10-15 NOTE — Telephone Encounter (Signed)
New Prob     Was hospitalized Sunday at [facility] for a fall, had a syncope episode. Doctor DCed (LASIX) prescribed by Dr. Antoine Poche and altered one of the meds (DIGOXIN). Would like to speak to nurse.

## 2012-10-15 NOTE — Telephone Encounter (Signed)
As requested by the patient, I have faxed a request to [facility] Records to get copies of her hosp notes and CT/MRI reports from her recent hospital stay. I have also asked that they mail a copy of the CT/MRI disc for review.   Medical records fax# [phone]  Roanna Raider S.

## 2012-10-15 NOTE — Telephone Encounter (Signed)
Pt son, [name], wanted Dr. Antoine Poche to know pt had been in the hospital at [facility] Sunday due to a fall & near syncopal episode. Mainly that medications lasix & digoxin had been changed.  Son states dig level was 1.7 & instructions were given for pt to take digoxin every other day. Son also states due to dehydration & CT scan showing no signs of CHF   pts' lasix was discontinued at that time.  Son states pt seems to be doing well. He asked for parameters related to HR for Digoxin administration & will call back if HR decreases. He is aware pt has appt 10/21/12 with her pcp to follow-up post hospital.  I will forward this message to Dr. Antoine Poche & his nurse Elita Quick. Mylo Red RN

## 2012-10-19 ENCOUNTER — Encounter: Payer: Self-pay | Admitting: *Deleted

## 2012-10-19 NOTE — Telephone Encounter (Signed)
Pt daughter is aware of KC recommendations. She agreed and verbalized understanding.

## 2012-10-19 NOTE — Telephone Encounter (Signed)
.   Posthospitalization followup with either myself or nurse practitioner Rikki Spearing first available soon as possible  Dr. Kalman Shan, M.D., [facility].C.P Pulmonary and Critical Care Medicine Staff Physician [facility] Pager: [phone], If no answer or between  15:00h - 7:00h: call [phone]  10/19/2012 12:42 PM

## 2012-10-19 NOTE — Telephone Encounter (Signed)
May be getting too much moisture with humidifier.  Rather than stop using, just turn heat down or off. Regarding their other question, not sure how to answer except that cpap helps her get into deeper sleep, and when she awakens, may be more disoriented coming from that depth of sleep?

## 2012-10-19 NOTE — Telephone Encounter (Signed)
Spoke with pt's daughter in law Scheduled HFU with TP for 10/22/12  Daughter in law states pt unable to tolerate CPAP She started having HA's when using the humidifier, so stopped this and the HA went away Then, she started feeling more confused when she would use CPAP, woke up after approx 1 hour seeing things in her room and struggling to pull of the mask She states that she does not do this whenever she sleeps without the CPAP Please advise recs thanks

## 2012-10-20 ENCOUNTER — Ambulatory Visit: Payer: Medicare Other | Admitting: Family Medicine

## 2012-10-21 ENCOUNTER — Ambulatory Visit: Payer: Medicare Other | Admitting: Family Medicine

## 2012-10-21 ENCOUNTER — Encounter: Payer: Self-pay | Admitting: Family Medicine

## 2012-10-21 ENCOUNTER — Ambulatory Visit (INDEPENDENT_AMBULATORY_CARE_PROVIDER_SITE_OTHER): Payer: Medicare Other | Admitting: Family Medicine

## 2012-10-21 VITALS — BP 122/66 | HR 86 | Temp 98.2°F | Ht 66.0 in | Wt 132.5 lb

## 2012-10-21 DIAGNOSIS — I2699 Other pulmonary embolism without acute cor pulmonale: Secondary | ICD-10-CM

## 2012-10-21 DIAGNOSIS — I4891 Unspecified atrial fibrillation: Secondary | ICD-10-CM

## 2012-10-21 DIAGNOSIS — F0390 Unspecified dementia without behavioral disturbance: Secondary | ICD-10-CM

## 2012-10-21 DIAGNOSIS — I10 Essential (primary) hypertension: Secondary | ICD-10-CM

## 2012-10-21 DIAGNOSIS — D649 Anemia, unspecified: Secondary | ICD-10-CM

## 2012-10-21 DIAGNOSIS — R627 Adult failure to thrive: Secondary | ICD-10-CM

## 2012-10-21 DIAGNOSIS — I5031 Acute diastolic (congestive) heart failure: Secondary | ICD-10-CM

## 2012-10-21 DIAGNOSIS — E538 Deficiency of other specified B group vitamins: Secondary | ICD-10-CM

## 2012-10-21 DIAGNOSIS — I509 Heart failure, unspecified: Secondary | ICD-10-CM

## 2012-10-21 LAB — VITAMIN B12: Vitamin B-12: 367 pg/mL (ref 211–911)

## 2012-10-21 MED ORDER — METFORMIN HCL 500 MG PO TABS: 500.0000 mg | ORAL_TABLET | Freq: Three times a day (TID) | ORAL | Status: DC

## 2012-10-21 MED ORDER — FLUTICASONE PROPIONATE 50 MCG/ACT NA SUSP: 2.0000 | Freq: Every day | NASAL | Status: DC | PRN

## 2012-10-21 NOTE — Progress Notes (Signed)
Subjective:    Patient ID: [name], female    DOB: 08-Dec-1929, 79 y.o.   MRN: [id number]  HPI Here for f/u hosp at [facility]- weakness and othrostatic hypotension  Also to check B12 after shots  Dig level high in hosp- dose cut to QOD and wil check level today  Lasix stopped due to orhostatic hypotension and dehydration (low po intake) No problems with CHF symptoms  Wt is up 8 lb- ? Fluid or eating more By their scales is 128-130 lb - not much of a change  Changed zoloft to remeron for sleep an appetite Appetite is some better Still not sleeping well at night - sleeps better during the day   MS changes have not improved during the hospitalization  Stopped glipizide for low po intake a1c 6.1 there Sugars are a bit higher 2 hours after a meal 180 , fasting in the 120-150  Seeing pulmonary tomorrow at 3 pm  Seeing Tammi- still having cpap issues   Increased her metformin to tid -no diarrhea from this   Needs refil on flonase   Biggest problem is still dementia progression and also paranoia - she thinks there is a machine in her bedroom, sometimes not oriented to place or person at times  More agitation -especially at night   Patient Active Problem List  Diagnosis  . HYPERCHOLESTEROLEMIA  . ANEMIA  . Former smoker  . MORTON'S NEUROMA  . MACULAR DEGENERATION  . HYPERTENSION  . Atrial fibrillation  . ALLERGIC RHINITIS  . OSTEOPOROSIS  . DIABETES MELLITUS, TYPE II  . UNSPECIFIED VITAMIN D DEFICIENCY  . Mixed incontinence urge and stress  . Dementia  . Acute diastolic CHF (congestive heart failure)  . COPD (chronic obstructive pulmonary disease)  . Acute and chronic respiratory failure  . Esophageal dysmotility suspected  . GERD (gastroesophageal reflux disease)  . Chest pain  . PE (pulmonary embolism)  . Urine frequency  . OSA (obstructive sleep apnea)  . Hypertrophic toenail  . Vitamin B12 deficiency  . Failure to thrive in adult  . Depression  .  Abdominal pain, lower  . Near syncope  . Headache   Past Medical History  Diagnosis Date  . Allergy     allergic Rhintis  . Hyperlipidemia   . Hypertension   . Osteoporosis   . PAF (paroxysmal atrial fibrillation)     a. PAF dates back > 5 yrs, prev on coumadin->d/c 2006 2/2 GIB;   . Carotid stenosis     a. 09/2007: Carotid Doppler stable- stable bilateral stenosis 40-59% (from 06)  . Type II diabetes mellitus   . Tobacco abuse     a. ongoing - 50+ pack yr hx.  . Dementia     Dementia in Hospital //Does O.K. at home.  . Diverticulosis   . GI bleeding     a. when on coumadin in 2006 - colonoscopy @ that time showed 2 large right colon avm's, multiple small bowel avm's, fresh blood in prox small bowel.  . AVM (arteriovenous malformation) of colon     a. 2006 colonscopy  . Chronic respiratory failure   . COPD (chronic obstructive pulmonary disease)   . Chronic diastolic CHF (congestive heart failure)   . Normocytic anemia 03/2012  . Complication of anesthesia   . PONV (postoperative nausea and vomiting)   . Shortness of breath   . Pneumonia     MULTIPLE TIMES"  . Heart murmur   . GERD (gastroesophageal reflux disease)  Past Surgical History  Procedure Laterality Date  . Cardiac catheterization  1990's    clear  . Orif tibia & fibula fractures  05/2004  . Small bowel enteroscopy  03/2005    AVMS  . Colonoscopy  08/2004  . Esophagogastroduodenoscopy  08/2004    Multiple    History  Substance Use Topics  . Smoking status: Former Smoker -- 0.50 packs/day for 50 years    Types: Cigarettes    Quit date: 04/21/2012  . Smokeless tobacco: Never Used  . Alcohol Use: No   Family History  Problem Relation Age of Onset  . Stroke Mother     a. believes she died suddenly @ 72.  . Diabetes Mother   . Stroke Father     Died from cerebral hemorrhage @ 78  . Diabetes Father   . Cancer Sister     colon and stomach cancer  . Diabetes Brother   . Heart disease Brother   .  Cancer Brother     died from lung cancer  . Heart disease Brother     MI  . Cancer Sister     ? liver//brain cancer  . Heart disease Sister   . Diabetes Sister     diabetes and glaucoma  . Cancer Brother     pancreatic cancer  . Diabetes Son    Allergies  Allergen Reactions  . Azithromycin     REACTION: inc blood sugar  . Codeine Itching  . Coumadin (Warfarin) Other (See Comments)    Causes bleeding  . Oxybutynin Chloride Er     Constipation/ dizziness/ dry mouth   Current Outpatient Prescriptions on File Prior to Visit  Medication Sig Dispense Refill  . aspirin 81 MG tablet Take 81 mg by mouth daily.        [name] atorvastatin (LIPITOR) 10 MG tablet Take 10 mg by mouth daily.      . Budesonide 90 MCG/ACT inhaler Inhale 2 puffs into the lungs 2 (two) times daily.  2 Inhaler  5  . Cholecalciferol (VITAMIN D-3) 1000 UNITS CAPS Take 1 capsule by mouth daily.      . digoxin (LANOXIN) 0.125 MG tablet Take 1 tablet (0.125 mg total) by mouth daily.  30 tablet  5  . diltiazem (CARDIZEM CD) 240 MG 24 hr capsule Take 240 mg by mouth daily.      [name] donepezil (ARICEPT) 10 MG tablet Take 1 tablet (10 mg total) by mouth at bedtime as needed.  30 tablet  11  . glucose blood (ONE TOUCH ULTRA TEST) test strip 1 each by Other route as needed. Use as instructed Check blood sugar daily and as needed       . lansoprazole (PREVACID) 15 MG capsule Take 15 mg by mouth daily.      [name] levalbuterol (XOPENEX) 0.63 MG/3ML nebulizer solution Take 1 ampule by nebulization 2 (two) times daily.        No current facility-administered medications on file prior to visit.    Review of Systems Review of Systems  Constitutional: Negative for fever, appetite change,  Pos for fatigue and poor appetite Eyes: Negative for pain and visual disturbance.  Respiratory: Negative for cough and shortness of breath.   Cardiovascular: Negative for cp or palpitations    Gastrointestinal: Negative for nausea, diarrhea and  constipation.  Genitourinary: Negative for urgency and frequency.  Skin: Negative for pallor or rash   Neurological: Negative for weakness, light-headedness, numbness and headaches. (no longer dizzy) Hematological: Negative  for adenopathy. Does not bruise/bleed easily.  Psychiatric/Behavioral: pos for dysphoric mood with paranoia/ anxiety and dementia       Objective:   Physical Exam  Constitutional: She appears well-developed and well-nourished. No distress.  Elderly female- seems mildly confused   HENT:  Head: Normocephalic and atraumatic.  Right Ear: External ear normal.  Left Ear: External ear normal.  Mouth/Throat: Oropharynx is clear and moist. No oropharyngeal exudate.  Eyes: Conjunctivae and EOM are normal. Pupils are equal, round, and reactive to light. Right eye exhibits no discharge. Left eye exhibits no discharge. No scleral icterus.  Neck: Normal range of motion. Neck supple. No JVD present. Carotid bruit is not present. No tracheal deviation present. No thyromegaly present.  Cardiovascular: Normal rate, regular rhythm and normal heart sounds.  Exam reveals no gallop.   Pulmonary/Chest: Effort normal and breath sounds normal. No respiratory distress. She has no wheezes. She has no rales.  Diffusely distant bs  Good air exch overall  Abdominal: Soft. Bowel sounds are normal. She exhibits no distension and no mass. There is no tenderness.  Musculoskeletal: Normal range of motion. She exhibits no edema and no tenderness.  Varicosities noted   Lymphadenopathy:    She has no cervical adenopathy.  Neurological: She is alert. She has normal reflexes. No cranial nerve deficit. She exhibits normal muscle tone. Coordination normal.  Skin: Skin is warm and dry. No rash noted. No erythema. No pallor.  Psychiatric: Her mood appears anxious. Her affect is blunt. Her speech is tangential. Her speech is not rapid and/or pressured. She is withdrawn. Thought content is paranoid. Thought  content is not delusional. She exhibits a depressed mood. She expresses no homicidal and no suicidal ideation. She exhibits abnormal recent memory.  Pt rambles  today - she seems generally fatigued as well She is inattentive.          Assessment & Plan:

## 2012-10-21 NOTE — Patient Instructions (Addendum)
Lab today for digoxin level and B12 I sent px to the pharmacy  Let me know when you decide on a facility - and get me any paperwork necessary  No change in medicines for now

## 2012-10-22 ENCOUNTER — Encounter: Payer: Self-pay | Admitting: Adult Health

## 2012-10-22 ENCOUNTER — Ambulatory Visit (INDEPENDENT_AMBULATORY_CARE_PROVIDER_SITE_OTHER): Payer: Medicare Other | Admitting: Adult Health

## 2012-10-22 VITALS — BP 102/64 | HR 62 | Temp 98.1°F | Ht 66.0 in | Wt 134.0 lb

## 2012-10-22 DIAGNOSIS — J449 Chronic obstructive pulmonary disease, unspecified: Secondary | ICD-10-CM

## 2012-10-22 LAB — DIGOXIN LEVEL: Digoxin Level: 0.8 ng/mL (ref 0.8–2.0)

## 2012-10-22 NOTE — Assessment & Plan Note (Signed)
Dig level high nl while in the hospital-dose was cut, re check this today Appetite improved  No cardiac changes

## 2012-10-22 NOTE — Progress Notes (Signed)
Subjective:    Patient ID: [name], female    DOB: 28-Jan-1930, 79 y.o.   MRN: [id number]  HPI PCP is Roxy Manns, MD   #Acute Respiratory Failure #Chronic Respiratory Failure / COPD - June 2010 - intubated with acinetobacter (resistant) lower lobe pneumonia following ankle fracture  - Admited 04/21/12 - 05/05/12 - Rx Bipap. Discharged on nocturnal NIPPV. In hospital PCO2 71  #Tobacco Abuse  - quit Oct 2013 post admission  #. Acute on Chronic Diastolic CHF  - EF 55-60% by echo 04/22/12  #Cachexia - Weight 150-->143lbs  - Body mass index is 22.55 kg/(m^2).  # Atrial Fibrillation w/ RVR  - H/o PAF. Not anticoagulation candidate due to h/o AVM/GIB, dementia, and fall risk  - Conservative therapy with rate control   #Other issues   -  Diabetes Mellitus Type 2, Anemia of chronic disease    OV 05/14/12  - Followup followin recent admission above HCAP/AECOPD/Acute on chronic respiratory failure. She is her with son and daughter in law. She is in SNF. She is getting stronger and doing some rehab. She looks better. No resp complaints. She has quit smoking an swears she will never smoke again. Meds reviewed and noticed she is on mdi. SHe looks too frail to do mdi. Son pleased with her progres  06/11/2012 Follow up and Med review  Patient returns for a one-month followup and medication review.  She has brought all of her medications in today for review. She has now been discharged from rehabilitation center back to home. She is living with her son. We reviewed all her medications and organized them into a medication calendar. Unfortunately, she misunderstood instructions were, it was not communicated at discharge to rehabilitation center. She is not taking her nebulizers as recommended from last visit. She is also only taking Pulmicort inhaler once daily. She is slowly getting stronger and is now able to walk with her walker and do independent. Dressing. She does require assistance  with bathing in the shower, mainly with only balance issues. Her dyspnea has decreased in her activity tolerance is slowly improving as long as she wears her oxygen. She continues on CPAP at night and has an upcoming sleep study. Next month. Her family is concerned because they're currently pain, for CPAP out-of-pocket, and this has not been ran to her insurance. She did have hypercarbia during her last admission with a PCO2 of 71. She denies any hemoptysis, orthopnea, PND, or increased leg swelling She was supposed to undergo a pulmonary function test. This morning, however, was unable to complete the test, you to inability to hold her breath  REC Continue oxygen  Continue cpap at night  Increase Pulmicort inhaler 2 puffs twice daily  Increase xopenex nebulizer 4 times daily and Ipratropium nebulizer 4 times daily  Follow med calendar closely and bring to each visit.  follow up Dr. Marchelle Gearing in 4 weeks and As needed  Please contact office for sooner follow up if symptoms do not improve or worsen or seek emergency care      OV 07/13/2012  - her with son and ? D-i-l. In interim PE admit and s/p IVC filter (no coumadin due to fall risk and easy bleed hx). Since then dementia worse. Home PT ongoing for short term. Overall frail. ECOG 3. COPD CAT score is 25 and reflects significant disease burden Needing lot of assist. She is feeling like a caged animal. Family providing constant care and assitance; facing burnout. Patient herself feels like  ccaged animal and feels caregivers not giving her assistance. Yet she cannot live by herself. Resulting in tension. Son privately confiding that dementia worse. They want to talk to me privately. COPD is stable . She uses nocturnal cpap but had sleep study 06/21/12 and wants to know results so it can be paid for. Per Dr Shelle Iron - moderate OSA present but no time to complete titration protocol   CAT COPD Symptom & Quality of Life Score (GSK trademark) 0 is no  burden. 5 is highest burden 07/13/2012   Never Cough -> Cough all the time 4  No phlegm in chest -> Chest is full of phlegm 2  No chest tightness -> Chest feels very tight 2  No dyspnea for 1 flight stairs/hill -> Very dyspneic for 1 flight of stairs 5  No limitations for ADL at home -> Very limited with ADL at home 5  Confident leaving home -> Not at all confident leaving home 3  Sleep soundly -> Do not sleep soundly because of lung condition 1  Lots of Energy -> No energy at all 3  TOTAL Score (max 40)  25    10/22/2012 Follow up  Pt returns for COPD follow up  She recently was admitted after a  fall few weeks ago , at discharge she was changed to Spriva from Ipratropium neb and Xopenex neb decreased Twice daily  . She continues on pulmicort inhaler Twice daily   .  She is accompanied by her family . Says her COPD is at baseline with no increased cough or wheezing.  She wears O2 on/off At bedtime  . Uses CPAP some, gets confused at night (hx of dementia) .  No hemoptysis, edema, n/v. Or chest pain.       Review of Systems  Constitutional: Negative for fever and unexpected weight change.  HENT: Negative for ear pain, nosebleeds, congestion, sore throat, rhinorrhea, sneezing, trouble swallowing, dental problem, postnasal drip and sinus pressure.   Eyes: Negative for redness and itching.  Respiratory: Negative for cough, chest tightness, shortness of breath and wheezing.   Cardiovascular: Negative for palpitations and leg swelling.  Gastrointestinal: Negative for nausea and vomiting.  Genitourinary: Negative for dysuria.  Musculoskeletal: Negative for joint swelling.  Skin: Negative for rash.  Neurological: Negative for headaches.  Hematological: Does not bruise/bleed easily.  Psychiatric/Behavioral: Negative for dysphoric mood. The patient is not nervous/anxious.        Objective:   Physical Exam GEN: A/Ox3; pleasant , NAD, elderly and chronically ill appearing   HEENT:   [place]/AT,  EACs-clear, TMs-wnl, NOSE-clear, THROAT-clear, no lesions, no postnasal drip or exudate noted.   NECK:  Supple w/ fair ROM; no JVD; normal carotid impulses w/o bruits; no thyromegaly or nodules palpated; no lymphadenopathy.  RESP  Diminshed BS in bases .no accessory muscle use, no dullness to percussion  CARD:  RRR, no m/r/g  , no peripheral edema, pulses intact, no cyanosis or clubbing.  GI:   Soft & nt; nml bowel sounds; no organomegaly or masses detected.  Musco: Warm bil, no deformities or joint swelling noted.   Neuro: alert, no focal deficits noted.    Skin: Warm, no lesions or rashes          Assessment & Plan:

## 2012-10-22 NOTE — Assessment & Plan Note (Signed)
Due in part to dementia (progressive) and other chronic dz Enc food and fluids (has been orthostatic in past) remeron seems to help appetite a bit No GI c/o at all

## 2012-10-22 NOTE — Assessment & Plan Note (Signed)
bp ok off lasix  Continue to watch

## 2012-10-22 NOTE — Assessment & Plan Note (Signed)
Family is having more difficulty caring for her and actively looking for a facility remeron helps appetitie -but no mood change yet  Some safety concerns at night and sundowning Rev hospital records in detail Also has neurology visit upcoming

## 2012-10-22 NOTE — Patient Instructions (Addendum)
Continue on Pulmicort inhaler 2 puffs twice daily Continue on Xopenex nebulizer Twice daily   Continue on Spiriva 1 puff daily  Follow up Dr. Marchelle Gearing in 2 months  Please contact office for sooner follow up if symptoms do not improve or worsen or seek emergency care

## 2012-10-22 NOTE — Assessment & Plan Note (Signed)
This seems stable off lasix- some wt gain but no edema noted (she is eating more and on remeron)

## 2012-10-22 NOTE — Assessment & Plan Note (Addendum)
Compensated on present regimen   Plan  Continue on Pulmicort inhaler 2 puffs twice daily Continue on Xopenex nebulizer Twice daily   Continue on Spiriva 1 puff daily  Follow up Dr. Marchelle Gearing in 2 months  Please contact office for sooner follow up if symptoms do not improve or worsen or seek emergency care

## 2012-10-22 NOTE — Assessment & Plan Note (Signed)
Check level today after 4 B12 shots Will transition to oral repl now that shots are in national shortage No numbness or other symptoms (besides dementia)

## 2012-10-28 ENCOUNTER — Encounter: Payer: Self-pay | Admitting: Neurology

## 2012-10-28 ENCOUNTER — Ambulatory Visit (INDEPENDENT_AMBULATORY_CARE_PROVIDER_SITE_OTHER): Payer: Medicare Other | Admitting: Neurology

## 2012-10-28 VITALS — BP 128/70 | HR 78 | Temp 98.2°F | Resp 12 | Wt 132.0 lb

## 2012-10-28 DIAGNOSIS — F09 Unspecified mental disorder due to known physiological condition: Secondary | ICD-10-CM

## 2012-10-28 DIAGNOSIS — F079 Unspecified personality and behavioral disorder due to known physiological condition: Secondary | ICD-10-CM

## 2012-10-28 NOTE — Progress Notes (Signed)
[name] is an 79 year old woman with COPD and episodes of pneumonia and syncope requiring hospitalization in the last 6 months.  She does not have a family history of dementia, but she started out with her parents ages significantly.  Her son had noticed mild memory issues in the past couple of years but nothing that really alarmed them. She lived alone and was fairly independent.  He went to visit her last fall and she had passed out.  When the medics came her oxygen saturations were in the 60s. She had pneumonia and was on a ventilator.  At that time she was sleeping poorly and she was placed on BiPAP for sleep apnea.  She is now on CPAP at 10 cm water pressure without a humidifier and she is sleeping much more peacefully.  She just actually be fairly clear from 9:00 in the morning until about one or 2 in the afternoon.  She definitely tends to sundown and see things that aren't there and did not recognize was going on and who is really around her.  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  In the hospital she had sundowning and whenever she got back in the hospital she dropped down a couple notches in her ability to function and remember things.  This happened at least during 2 of her recent hospitalizations.  She is now on Aricept and there is no obvious indication that it has helped her to function much better, but she continues to take her without any significant side effects.  She is living with her son and she would like to be placed into a nursing home and they are investigating the possibility.  Review of systems is positive for disorientation episodes for short-term memory, limited appetite and shortness of breath with exertion.  Remainder of review of systems is unremarkable.  Past Medical History  Diagnosis Date  . Allergy     allergic Rhintis  . Hyperlipidemia   . Hypertension   . Osteoporosis   . PAF (paroxysmal atrial fibrillation)     a. PAF dates  back > 5 yrs, prev on coumadin->d/c 2006 2/2 GIB;   . Carotid stenosis     a. 09/2007: Carotid Doppler stable- stable bilateral stenosis 40-59% (from 06)  . Type II diabetes mellitus   . Tobacco abuse     a. ongoing - 50+ pack yr hx.  . Dementia     Dementia in Hospital //Does O.K. at home.  . Diverticulosis   . GI bleeding     a. when on coumadin in 2006 - colonoscopy @ that time showed 2 large right colon avm's, multiple small bowel avm's, fresh blood in prox small bowel.  . AVM (arteriovenous malformation) of colon     a. 2006 colonscopy  . Chronic respiratory failure   . COPD (chronic obstructive pulmonary disease)   . Chronic diastolic CHF (congestive heart failure)   . Normocytic anemia 03/2012  . Complication of anesthesia   . PONV (postoperative nausea and vomiting)   . Shortness of breath   . Pneumonia     MULTIPLE TIMES"  . Heart murmur   . GERD (gastroesophageal reflux disease)     Current Outpatient Prescriptions on File Prior to Visit  Medication Sig Dispense Refill  . aspirin 81 MG tablet Take 81 mg by mouth daily.        [name] atorvastatin (LIPITOR) 10 MG tablet Take 10 mg by mouth daily.      [name]  Budesonide 90 MCG/ACT inhaler Inhale 2 puffs into the lungs 2 (two) times daily.  2 Inhaler  5  . Cholecalciferol (VITAMIN D-3) 1000 UNITS CAPS Take 1 capsule by mouth daily.      . digoxin (LANOXIN) 0.125 MG tablet Take 0.125 mg by mouth every other day.      . diltiazem (CARDIZEM CD) 240 MG 24 hr capsule Take 240 mg by mouth daily.      [name] donepezil (ARICEPT) 10 MG tablet Take 10 mg by mouth at bedtime.      . fluticasone (FLONASE) 50 MCG/ACT nasal spray Place 2 sprays into the nose daily as needed for rhinitis or allergies. For allergies  16 g  11  . glucose blood (ONE TOUCH ULTRA TEST) test strip 1 each by Other route as needed. Use as instructed Check blood sugar daily and as needed       . lansoprazole (PREVACID) 15 MG capsule Take 15 mg by mouth daily.      [name] levalbuterol  (XOPENEX) 0.63 MG/3ML nebulizer solution Take 1 ampule by nebulization 2 (two) times daily.       . metFORMIN (GLUCOPHAGE) 500 MG tablet Take 1 tablet (500 mg total) by mouth 3 (three) times daily.  90 tablet  11  . mirtazapine (REMERON) 15 MG tablet Take 0.5 tablets by mouth daily.       [name] SPIRIVA HANDIHALER 18 MCG inhalation capsule daily.      . Wheat Germ Oil CAPS Take 1 capsule by mouth daily.       No current facility-administered medications on file prior to visit.   Azithromycin; Codeine; Coumadin; and Oxybutynin chloride er allergies  History   Social History  . Marital Status: Widowed    Spouse Name: N/A    Number of Children: N/A  . Years of Education: N/A   Occupational History  . Not on file.   Social History Main Topics  . Smoking status: Former Smoker -- 0.50 packs/day for 50 years    Types: Cigarettes    Quit date: 04/21/2012  . Smokeless tobacco: Never Used  . Alcohol Use: No  . Drug Use: No  . Sexually Active: No   Other Topics Concern  . Not on file   Social History Narrative   Lives in [place] by herself.    Family History  Problem Relation Age of Onset  . Stroke Mother     a. believes she died suddenly @ 31.  . Diabetes Mother   . Stroke Father     Died from cerebral hemorrhage @ 12  . Diabetes Father   . Cancer Sister     colon and stomach cancer  . Diabetes Brother   . Heart disease Brother   . Cancer Brother     died from lung cancer  . Heart disease Brother     MI  . Cancer Sister     ? liver//brain cancer  . Heart disease Sister   . Diabetes Sister     diabetes and glaucoma  . Cancer Brother     pancreatic cancer  . Diabetes Son     BP 128/70  Pulse 78  Temp(Src) 98.2 F (36.8 C)  Resp 12  Wt 132 lb (59.875 kg)  BMI 21.32 kg/m2   Alert and oriented x 2.  Memory function appears to bepoor for shrt term.  Concentration and attention are fair for educational level and background.  Speech is fluent but with mild word  finding difficulty.  Has mild difficulty with clock draw and prverb interp. No carotid bruits detected.  Cranial nerve II through XII are within normal limits.  This includes normal optic discs and acuity, EOMI, PERLA, facial movement and sensation intact, hearing grossly intact, gag intact,Uvula raises symmetrically and tongue protrudes evenly. Motor strength is 4+ over 5 throughout all limbs.  No atrophy, abnormal tone or tremors. Reflexes are1+ and symmetric in the upper and lower extremities Sensory exam is intact. Coordination is intact for fine movements and rapid alternating movements in all limbs Patient can stand for weight and arrives in wheelchair.   impression  1. 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  I think if her medical health is doing fantastic, she will tend to be more stable.  I think as her medical health causes more problems especially with breathing issues, she will tend to have more problems with dementia.  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  Plan: 1. It is okay for her to continue the Aricept as she is tolerating it well and it may be of some slight benefit. 2. Discussed nursing home placement and that would appear to beThe best option. 3. Return p.r.n.

## 2012-10-29 ENCOUNTER — Telehealth: Payer: Self-pay | Admitting: Family Medicine

## 2012-10-29 NOTE — Telephone Encounter (Signed)
[name] did advise me that they were leaning toward skilled care but found a place that has assisted living but would still help her with meal prep, medications, and bathing/dressing, so they may put her in that assisted living facility

## 2012-10-29 NOTE — Telephone Encounter (Signed)
[facility] Triage Call Report Triage Record Num: [id number] Operator: Griselda Miner Patient Name: [name] Call Date & Time: 10/28/2012 5:25:28PM Patient Phone: [phone] PCP: Idamae Schuller [facility] Patient Gender: Female PCP Fax : Patient DOB: 1929/10/05 Practice Name: [facility] Reason for Call: Caller: Paula/Other; PCP: Roxy Manns [facility]); CB#: [phone]; Call regarding some issue; Daughter in law calls to let Dr. Milinda Antis know that patient saw the neurologist today. Also, with the financial situation being sorted through by an attorney-it is request that a FL2 form be completed to certify what level of care that she needs-assisted living or skilled care. Please follow-up with family regarding this and if Dr. Milinda Antis will be able to complete it. Protocol(s) Used: Office Note Recommended Outcome per Protocol: Information Noted and Sent to Office Reason for Outcome: Caller information to office Care Advice: ~ 10/28/2012 5:30:48PM Page 1 of 1 [email or web address]

## 2012-10-29 NOTE — Telephone Encounter (Signed)
[name] said they don't have an fl2 form yet they just needed to know where to get one from, I advise them that they would get one from the nursing home they choose, [name] said they will drop the FL2 from off once they decide which place they are going to choose

## 2012-10-29 NOTE — Telephone Encounter (Signed)
I can complete it tomorrow when I am back in the office - were they leaning towards skilled care?

## 2012-11-04 ENCOUNTER — Telehealth: Payer: Self-pay | Admitting: Family Medicine

## 2012-11-04 MED ORDER — MIRTAZAPINE 15 MG PO TABS: 15.0000 mg | ORAL_TABLET | Freq: Every day | ORAL | Status: DC

## 2012-11-04 NOTE — Telephone Encounter (Signed)
Patient Information:  Caller Name: [name]  Phone: [phone]  Patient: [name], [name]  Gender: Female  DOB: 03-31-1930  Age: 79 Years  PCP: Roxy Manns [facility])  Office Follow Up:  Does the office need to follow up with this patient?: Yes  Instructions For The Office: Needs to know if Remeron needs to be increased and if there is something Shalandria can take for her appetite  RN Note:  has questions about Remeron; taking 0.5mg  daily; says she thought they were supposed to increase it to 1mg  daily, but could not find it in her d/c notes; checked epic and found no notes on the subject; also wondering if Derian may need something to help with her appetite  Symptoms  Reason For Call & Symptoms: [name] is concerned about Amera's condition for the past 1-2wks; said she is confused and weak,which is not new; concerned about her intake; says she wants to eat less and less; says she had 10-12oz of fluid intake 5/6; has had 8-10oz of water today, along with some sips of Ensure; had cereal, yogurt and some fruit for breakfast; couple of bites of a pot pie for lunch; voided 2x today; says her urine is a little darker and is concerned because she was dehydrated when she went in the hospital a month ago; mouth moist; denies any other changes in her  Reviewed Health History In EMR: Yes  Reviewed Medications In EMR: Yes  Reviewed Allergies In EMR: Yes  Reviewed Surgeries / Procedures: Yes  Date of Onset of Symptoms: 10/24/2012  Guideline(s) Used:  Weakness (Generalized) and Fatigue  Disposition Per Guideline:   See Within 3 Days in Office  Reason For Disposition Reached:   Fatigue (i.e., tires easily, decreased energy) and persists > 1 week  Advice Given:  N/A  RN Overrode Recommendation:  Follow Up With Office Later  would like call back from office

## 2012-11-04 NOTE — Telephone Encounter (Signed)
Rx called in as prescribed and [name] notified and advise of Dr. Royden Purl comments of increased dose

## 2012-11-04 NOTE — Telephone Encounter (Signed)
We can increase from .5 to 1 pill - let me know if any side effects or if that sedates too much, hope it will help appetitie Px written for call in

## 2012-11-10 ENCOUNTER — Telehealth: Payer: Self-pay | Admitting: *Deleted

## 2012-11-10 MED ORDER — BUDESONIDE 180 MCG/ACT IN AEPB: 1.0000 | INHALATION_SPRAY | Freq: Two times a day (BID) | RESPIRATORY_TRACT | Status: DC

## 2012-11-10 NOTE — Telephone Encounter (Signed)
recevied fax asking if ok to substitute pulmicort 180 1 puff bid for pulmicort 90 because it is not available. Per MR ok to do so. New rx sent per pharmacy requests. Carron Curie, CMA

## 2012-11-11 ENCOUNTER — Telehealth: Payer: Self-pay | Admitting: *Deleted

## 2012-11-11 DIAGNOSIS — Z0279 Encounter for issue of other medical certificate: Secondary | ICD-10-CM

## 2012-11-11 NOTE — Telephone Encounter (Signed)
Form placed in your inbox

## 2012-11-11 NOTE — Telephone Encounter (Signed)
Done and in IN box 

## 2012-11-11 NOTE — Telephone Encounter (Signed)
Son Darryl Barritt dropped off [place] -2 papers for Dr. Milinda Antis to sign.  Please call Darryl at [phone] when complete.

## 2012-11-12 NOTE — Telephone Encounter (Signed)
Pt's son left message on form requesting the results of pt's Tb skin test done in hospital back in Oct 2013. I can't find any record of a Tb skin test or the blood test to check for Tb in pt's record, I had another CMA and the lab tech check and we couldn't find any record of Tb test, do you want pt to come to office to get another test done, please advise

## 2012-11-12 NOTE — Telephone Encounter (Signed)
Yes- (though I know they live far from here and can get it closer to where they live that is fine also )- cannot do on a Thursday here due to need to have it read 2 d later

## 2012-11-13 ENCOUNTER — Telehealth: Payer: Self-pay | Admitting: Family Medicine

## 2012-11-13 NOTE — Telephone Encounter (Signed)
Pt's son notified forms ready for pick up and advise of the fee, I did advise son that there was no record of Tb test so pt would need another one

## 2012-11-13 NOTE — Telephone Encounter (Signed)
Pt's daughter in law says pt needs a PPD b/c she is going into a nursing home. Is it ok to schedule this? Thank you.

## 2012-11-13 NOTE — Telephone Encounter (Signed)
Yes- schedule nurse visit on a day when they can return 2 d later to have it read

## 2012-11-13 NOTE — Telephone Encounter (Signed)
Nurse appt scheduled for 11/17/12

## 2012-11-17 ENCOUNTER — Ambulatory Visit (INDEPENDENT_AMBULATORY_CARE_PROVIDER_SITE_OTHER): Payer: Medicare Other | Admitting: *Deleted

## 2012-11-17 DIAGNOSIS — Z201 Contact with and (suspected) exposure to tuberculosis: Secondary | ICD-10-CM

## 2012-11-17 DIAGNOSIS — Z111 Encounter for screening for respiratory tuberculosis: Secondary | ICD-10-CM

## 2012-11-17 MED ORDER — TUBERCULIN PPD 5 UNIT/0.1ML ID SOLN
5.0000 [IU] | Freq: Once | INTRADERMAL | Status: DC
  Administered 2012-11-17: 5 [IU] via INTRADERMAL

## 2012-11-20 ENCOUNTER — Telehealth: Payer: Self-pay | Admitting: Internal Medicine

## 2012-11-20 MED ORDER — LANSOPRAZOLE 15 MG PO CPDR: 15.0000 mg | DELAYED_RELEASE_CAPSULE | Freq: Every day | ORAL | Status: DC

## 2012-11-20 NOTE — Telephone Encounter (Signed)
Faxed rx for the Prevacid to [phone] as requested.

## 2012-11-24 ENCOUNTER — Telehealth: Payer: Self-pay | Admitting: Internal Medicine

## 2012-11-24 NOTE — Telephone Encounter (Signed)
Daughter in law calling about xopenex inhaler that was listed on some paperwork back in November and wanted to know if pt should use this and if so would need this called in, daughter in law states pt has never had this med and if not necessary does not want to add, she states they have appt with MR on 6/16 and would prefer to discuss at this time. Told daughter in law that would be fine and if in the meantime they felt anything was needed to please give [place] a call back, but she does have a nebulizer that she uses twice a day.

## 2012-11-26 ENCOUNTER — Telehealth: Payer: Self-pay | Admitting: Family Medicine

## 2012-11-26 ENCOUNTER — Other Ambulatory Visit: Payer: Self-pay | Admitting: Family Medicine

## 2012-11-26 NOTE — Telephone Encounter (Signed)
received request from [facility] living- pt is having some constipation. Recommended start colace 100mg  bid prn constipation, hold for diarrhea and miralax 17gm in 8 oz fluid qd prn constipation hold for diarrhea. Placed in my out box to fax back.

## 2012-11-27 ENCOUNTER — Telehealth: Payer: Self-pay | Admitting: Family Medicine

## 2012-11-27 NOTE — Telephone Encounter (Signed)
Patient Information:  Caller Name: [name]  Phone: [phone]  Patient: Lauren, Morgan  Gender: Female  DOB: 1929-07-26  Age: 79 Years  PCP: Roxy Manns [facility])  Office Follow Up:  Does the office need to follow up with this patient?: No  Instructions For The Office: N/A  RN Note:  Advised that Colace is over the counter. Will call back with any problems.  Symptoms  Reason For Call & Symptoms: Daughter in law calling about pt's constipation. States MD recommended and sent and order for Miralax and Colace. Miralax is available on site but Colace is not.  Reviewed Health History In EMR: N/A  Reviewed Medications In EMR: N/A  Reviewed Allergies In EMR: N/A  Reviewed Surgeries / Procedures: N/A  Date of Onset of Symptoms: 11/27/2012  Guideline(s) Used:  No Protocol Available - Sick Adult  Disposition Per Guideline:   Home Care  Reason For Disposition Reached:   Patient's symptoms are safe to treat at home per nursing judgment  Advice Given:  N/A  Patient Will Follow Care Advice:  YES

## 2012-11-30 ENCOUNTER — Telehealth: Payer: Self-pay | Admitting: Internal Medicine

## 2012-11-30 MED ORDER — TIOTROPIUM BROMIDE MONOHYDRATE 18 MCG IN CAPS: 18.0000 ug | ORAL_CAPSULE | Freq: Every day | RESPIRATORY_TRACT | Status: DC

## 2012-11-30 NOTE — Telephone Encounter (Signed)
RX has been sent to the pharmacy for spiriva. I called and made son aware. Nothing further was needed

## 2012-12-14 ENCOUNTER — Ambulatory Visit (INDEPENDENT_AMBULATORY_CARE_PROVIDER_SITE_OTHER): Payer: Medicare Other | Admitting: Internal Medicine

## 2012-12-18 ENCOUNTER — Telehealth: Payer: Self-pay | Admitting: Internal Medicine

## 2012-12-18 NOTE — Telephone Encounter (Signed)
I spoke with pt son. He stated pt is wanting to stay at [facility] place for respite care. They are wanting to charge her over $900 just to administer the nebulizer medication. He is wanting to know if we can change her neb meds to inhalers that would work just as well to help save pt some money. Please advise MR thanks  --he is aware MR not back until next week.   Allergies  Allergen Reactions  . Azithromycin     REACTION: inc blood sugar  . Codeine Itching  . Coumadin (Warfarin) Other (See Comments)    Causes bleeding  . Oxybutynin Chloride Er     Constipation/ dizziness/ dry mouth

## 2012-12-19 NOTE — Telephone Encounter (Signed)
Confused.  Does he want MDI? If so what?  Does he want nebs?  The messages are confusing bcause nebs are typically cheaper    Dr. Kalman Shan, M.D., [facility].C.P Pulmonary and Critical Care Medicine Staff Physician [place] [facility] [place] [facility] Pager: [phone], If no answer or between  15:00h - 7:00h: call [phone]  12/19/2012 4:31 PM

## 2012-12-21 NOTE — Telephone Encounter (Signed)
The pt son is requesting the pt be changed over to MDI because the nursing home she is living in will charge them over $900 to administer the neb treatments each time. Its not the medications that are expensive, it is the charge for administering the neb. The pt is only using one neb medication and that is xopenex twice daily. She is also on pulmicort flexhaler and spiriva daily. So the question is if it is ok to change xopenex from nebulizer to MDI? Carron Curie, CMA

## 2012-12-21 NOTE — Telephone Encounter (Addendum)
Spoke with pt's son and notified of recs per MR He verbalized understanding and denied any questions Order was faxed to [employer] for d/c nebs and start xopenex HFA Nothing further needed

## 2012-12-21 NOTE — Telephone Encounter (Signed)
Yes that is tiotally fine.Change xopenex neb to albuterol mdi ior xopenex mdi (former cheaper)  I think is ridicuolus that $900 is charge to administer neb   Dr. Kalman Shan, M.D., [facility].C.P Pulmonary and Critical Care Medicine Staff Physician [place] [facility] [place] [facility] Critical [facility] Pager: [phone], If no answer or between  15:00h - 7:00h: call [phone]  12/21/2012 12:59 PM

## 2013-01-08 ENCOUNTER — Encounter: Payer: Self-pay | Admitting: Internal Medicine

## 2013-01-08 ENCOUNTER — Ambulatory Visit (INDEPENDENT_AMBULATORY_CARE_PROVIDER_SITE_OTHER): Payer: Medicare Other | Admitting: Internal Medicine

## 2013-01-08 VITALS — BP 102/60 | HR 85 | Temp 97.8°F | Wt 137.8 lb

## 2013-01-08 DIAGNOSIS — J449 Chronic obstructive pulmonary disease, unspecified: Secondary | ICD-10-CM

## 2013-01-08 NOTE — Progress Notes (Signed)
Subjective:    Patient ID: [name], female    DOB: January 28, 1930, 79 y.o.   MRN: [id number]  HPI  HPI PCP is Roxy Manns, MD   #Acute Respiratory Failure #Chronic Respiratory Failure / COPD - June 2010 - intubated with acinetobacter (resistant) lower lobe pneumonia following ankle fracture  - Admited 04/21/12 - 05/05/12 - Rx Bipap. Discharged on nocturnal NIPPV. In hospital PCO2 71  #Tobacco Abuse  - quit Oct 2013 post admission  #. Acute on Chronic Diastolic CHF  - EF 55-60% by echo 04/22/12  #Cachexia - Weight 150-->143lbs   - weight 137# on 01/08/2013 with Body mass index is 22.25 kg/(m^2).    # Atrial Fibrillation w/ RVR  - H/o PAF. Not anticoagulation candidate due to h/o AVM/GIB, dementia, and fall risk  - Conservative therapy with rate control   #Other issues   -  Diabetes Mellitus Type 2, Anemia of chronic disease  #Lives in [facility] - Memory Care Unit - since summer 2014   OV 05/14/12  - Followup followin recent admission above HCAP/AECOPD/Acute on chronic respiratory failure. She is her with son and daughter in law. She is in SNF. She is getting stronger and doing some rehab. She looks better. No resp complaints. She has quit smoking an swears she will never smoke again. Meds reviewed and noticed she is on mdi. SHe looks too frail to do mdi. Son pleased with her progres  06/11/2012 Follow up and Med review  Patient returns for a one-month followup and medication review.  She has brought all of her medications in today for review. She has now been discharged from rehabilitation center back to home. She is living with her son. We reviewed all her medications and organized them into a medication calendar. Unfortunately, she misunderstood instructions were, it was not communicated at discharge to rehabilitation center. She is not taking her nebulizers as recommended from last visit. She is also only taking Pulmicort inhaler once daily. She is slowly  getting stronger and is now able to walk with her walker and do independent. Dressing. She does require assistance with bathing in the shower, mainly with only balance issues. Her dyspnea has decreased in her activity tolerance is slowly improving as long as she wears her oxygen. She continues on CPAP at night and has an upcoming sleep study. Next month. Her family is concerned because they're currently pain, for CPAP out-of-pocket, and this has not been ran to her insurance. She did have hypercarbia during her last admission with a PCO2 of 71. She denies any hemoptysis, orthopnea, PND, or increased leg swelling She was supposed to undergo a pulmonary function test. This morning, however, was unable to complete the test, you to inability to hold her breath  REC Continue oxygen  Continue cpap at night  Increase Pulmicort inhaler 2 puffs twice daily  Increase xopenex nebulizer 4 times daily and Ipratropium nebulizer 4 times daily  Follow med calendar closely and bring to each visit.  follow up Dr. Marchelle Gearing in 4 weeks and As needed  Please contact office for sooner follow up if symptoms do not improve or worsen or seek emergency care      OV 07/13/2012  - her with son and ? D-i-l. In interim PE admit and s/p IVC filter (no coumadin due to fall risk and easy bleed hx). Since then dementia worse. Home PT ongoing for short term. Overall frail. ECOG 3. COPD CAT score is 25 and reflects significant disease burden Needing  lot of assist. She is feeling like a caged animal. Family providing constant care and assitance; facing burnout. Patient herself feels like ccaged animal and feels caregivers not giving her assistance. Yet she cannot live by herself. Resulting in tension. Son privately confiding that dementia worse. They want to talk to me privately. COPD is stable . She uses nocturnal cpap but had sleep study 06/21/12 and wants to know results so it can be paid for. Per Dr Shelle Iron - moderate OSA present  but no time to complete titration protocol   10/22/2012 Follow up  Pt returns for COPD follow up  She recently was admitted after a  fall few weeks ago , at discharge she was changed to Spriva from Ipratropium neb and Xopenex neb decreased Twice daily  . She continues on pulmicort inhaler Twice daily   .  She is accompanied by her family . Says her COPD is at baseline with no increased cough or wheezing.  She wears O2 on/off At bedtime  . Uses CPAP some, gets confused at night (hx of dementia) .  No hemoptysis, edema, n/v. Or chest pain.   REC Continue on Pulmicort inhaler 2 puffs twice daily Continue on Xopenex nebulizer Twice daily   Continue on Spiriva 1 puff daily  Follow up Dr. Marchelle Gearing in 2 months  Please contact office for sooner follow up if symptoms do not improve or worsen or seek emergency care    OV 01/08/2013 FU COPD  COPD is stable per son and dauhter. Patinet now living in memory care unit due to progressive dementia. THey want cpap stopped due to inability to comply despite various efforts. IT is expensive as well. No new issues. She is on mdi spiriva and pulmicort because this is cheaper to give in the memory care unit  CAT COPD Symptom & Quality of Life Score (GSK trademark) 0 is no burden. 5 is highest burden 07/13/2012   Never Cough -> Cough all the time 4  No phlegm in chest -> Chest is full of phlegm 2  No chest tightness -> Chest feels very tight 2  No dyspnea for 1 flight stairs/hill -> Very dyspneic for 1 flight of stairs 5  No limitations for ADL at home -> Very limited with ADL at home 5  Confident leaving home -> Not at all confident leaving home 3  Sleep soundly -> Do not sleep soundly because of lung condition 1  Lots of Energy -> No energy at all 3  TOTAL Score (max 40)  25      Review of Systems  Constitutional: Negative for fever and unexpected weight change.  HENT: Negative for ear pain, nosebleeds, congestion, sore throat, rhinorrhea,  sneezing, trouble swallowing, dental problem, postnasal drip and sinus pressure.   Eyes: Negative for redness and itching.  Respiratory: Negative for cough, chest tightness, shortness of breath and wheezing.   Cardiovascular: Negative for palpitations and leg swelling.  Gastrointestinal: Negative for nausea and vomiting.  Genitourinary: Negative for dysuria.  Musculoskeletal: Negative for joint swelling.  Skin: Negative for rash.  Neurological: Positive for headaches.  Hematological: Bruises/bleeds easily.  Psychiatric/Behavioral: Negative for dysphoric mood. The patient is not nervous/anxious.    Current outpatient prescriptions:aspirin 81 MG tablet, Take 81 mg by mouth daily.  , Disp: , Rfl: ;  budesonide (PULMICORT FLEXHALER) 180 MCG/ACT inhaler, Inhale 1 puff into the lungs 2 (two) times daily., Disp: 1 Inhaler, Rfl: 6;  Cholecalciferol (VITAMIN D-3) 1000 UNITS CAPS, Take 1 capsule by mouth  daily., Disp: , Rfl: ;  digoxin (LANOXIN) 0.125 MG tablet, Take 0.125 mg by mouth every other day., Disp: , Rfl:  diltiazem (CARDIZEM CD) 240 MG 24 hr capsule, Take 240 mg by mouth daily., Disp: , Rfl: ;  docusate sodium (COLACE) 100 MG capsule, Take 1 capsule (100 mg total) by mouth 2 (two) times daily as needed for constipation., Disp: , Rfl: ;  donepezil (ARICEPT) 10 MG tablet, Take 10 mg by mouth at bedtime., Disp: , Rfl: ;  lansoprazole (PREVACID) 15 MG capsule, Take 1 capsule (15 mg total) by mouth daily., Disp: 90 capsule, Rfl: 1 levalbuterol (XOPENEX HFA) 45 MCG/ACT inhaler, Inhale 1-2 puffs into the lungs every 4 (four) hours as needed for wheezing., Disp: , Rfl: ;  levalbuterol (XOPENEX) 0.63 MG/3ML nebulizer solution, Take 1 ampule by nebulization 2 (two) times daily. , Disp: , Rfl: ;  metFORMIN (GLUCOPHAGE) 500 MG tablet, Take 1 tablet (500 mg total) by mouth 3 (three) times daily., Disp: 90 tablet, Rfl: 11 mirtazapine (REMERON) 15 MG tablet, Take 1 tablet (15 mg total) by mouth daily., Disp: 30  tablet, Rfl: 11;  polyethylene glycol powder (GLYCOLAX/MIRALAX) powder, Take 17 g by mouth daily as needed., Disp: , Rfl: ;  tiotropium (SPIRIVA HANDIHALER) 18 MCG inhalation capsule, Place 1 capsule (18 mcg total) into inhaler and inhale daily., Disp: 30 capsule, Rfl: 5 vitamin B-12 (CYANOCOBALAMIN) 1000 MCG tablet, Take 1,000 mcg by mouth daily., Disp: , Rfl:      Objective:   Physical Exam  Filed Vitals:   01/08/13 1412  Weight: 137 lb 12.8 oz (62.506 kg)   Filed Vitals:   01/08/13 1412  BP: 102/60  Pulse: 85  Temp: 97.8 F (36.6 C)  TempSrc: Oral  Weight: 137 lb 12.8 oz (62.506 kg)  SpO2: 97%     Physical Exam GEN: A/Ox3; pleasant , NAD, elderly and chronically ill appearing   HEENT:  [place]/AT,  EACs-clear, TMs-wnl, NOSE-clear, THROAT-clear, no lesions, no postnasal drip or exudate noted.   NECK:  Supple w/ fair ROM; no JVD; normal carotid impulses w/o bruits; no thyromegaly or nodules palpated; no lymphadenopathy.  RESP  Diminshed BS in bases .no accessory muscle use, no dullness to percussion  CARD:  RRR, no m/r/g  , no peripheral edema, pulses intact, no cyanosis or clubbing.  GI:   Soft & nt; nml bowel sounds; no organomegaly or masses detected.  Musco: Warm bil, no deformities or joint swelling noted.   Neuro: alert, no focal deficits noted.    Skin: Warm, no lesions or rashes          Assessment & Plan:        Assessment & Plan:

## 2013-01-08 NOTE — Patient Instructions (Addendum)
#  Sleep apnea  - due to inability to comply, and affecting quality of life - stop and discontinue CPAP  #COPD  use spiriva and pulmicort mdi per schedule  use xopenex mdi as neeeded  flu shot in fall  #Followup  6-9 months COPD CAT score at followup

## 2013-01-09 NOTE — Assessment & Plan Note (Signed)
#  Sleep apnea  - due to inability to comply, and affecting quality of life - stop and discontinue CPAP  #COPD  use spiriva and pulmicort mdi per schedule  use xopenex mdi as neeeded  flu shot in fall  #Followup  6-9 months COPD CAT score at followup 

## 2013-01-28 ENCOUNTER — Encounter (HOSPITAL_COMMUNITY): Payer: Self-pay | Admitting: Emergency Medicine

## 2013-01-28 ENCOUNTER — Emergency Department (HOSPITAL_COMMUNITY)
Admission: EM | Admit: 2013-01-28 | Discharge: 2013-01-28 | Disposition: A | Discharge status: Home / Self Care | Payer: Medicare Other | Attending: Emergency Medicine | Admitting: Emergency Medicine

## 2013-01-28 DIAGNOSIS — Y9389 Activity, other specified: Secondary | ICD-10-CM | POA: Insufficient documentation

## 2013-01-28 DIAGNOSIS — F0390 Unspecified dementia without behavioral disturbance: Secondary | ICD-10-CM

## 2013-01-28 DIAGNOSIS — Z8701 Personal history of pneumonia (recurrent): Secondary | ICD-10-CM | POA: Insufficient documentation

## 2013-01-28 DIAGNOSIS — Z8739 Personal history of other diseases of the musculoskeletal system and connective tissue: Secondary | ICD-10-CM | POA: Insufficient documentation

## 2013-01-28 DIAGNOSIS — K219 Gastro-esophageal reflux disease without esophagitis: Secondary | ICD-10-CM | POA: Insufficient documentation

## 2013-01-28 DIAGNOSIS — W19XXXA Unspecified fall, initial encounter: Secondary | ICD-10-CM

## 2013-01-28 DIAGNOSIS — J449 Chronic obstructive pulmonary disease, unspecified: Secondary | ICD-10-CM | POA: Insufficient documentation

## 2013-01-28 DIAGNOSIS — R42 Dizziness and giddiness: Secondary | ICD-10-CM | POA: Insufficient documentation

## 2013-01-28 DIAGNOSIS — I5032 Chronic diastolic (congestive) heart failure: Secondary | ICD-10-CM | POA: Insufficient documentation

## 2013-01-28 DIAGNOSIS — J4489 Other specified chronic obstructive pulmonary disease: Secondary | ICD-10-CM | POA: Insufficient documentation

## 2013-01-28 DIAGNOSIS — Y929 Unspecified place or not applicable: Secondary | ICD-10-CM | POA: Insufficient documentation

## 2013-01-28 DIAGNOSIS — Z8719 Personal history of other diseases of the digestive system: Secondary | ICD-10-CM | POA: Insufficient documentation

## 2013-01-28 DIAGNOSIS — E119 Type 2 diabetes mellitus without complications: Secondary | ICD-10-CM | POA: Insufficient documentation

## 2013-01-28 DIAGNOSIS — R011 Cardiac murmur, unspecified: Secondary | ICD-10-CM | POA: Insufficient documentation

## 2013-01-28 DIAGNOSIS — Z862 Personal history of diseases of the blood and blood-forming organs and certain disorders involving the immune mechanism: Secondary | ICD-10-CM | POA: Insufficient documentation

## 2013-01-28 DIAGNOSIS — I4891 Unspecified atrial fibrillation: Secondary | ICD-10-CM | POA: Insufficient documentation

## 2013-01-28 DIAGNOSIS — W1789XA Other fall from one level to another, initial encounter: Secondary | ICD-10-CM | POA: Insufficient documentation

## 2013-01-28 DIAGNOSIS — Z043 Encounter for examination and observation following other accident: Secondary | ICD-10-CM | POA: Insufficient documentation

## 2013-01-28 DIAGNOSIS — Z8639 Personal history of other endocrine, nutritional and metabolic disease: Secondary | ICD-10-CM | POA: Insufficient documentation

## 2013-01-28 DIAGNOSIS — Z79899 Other long term (current) drug therapy: Secondary | ICD-10-CM | POA: Insufficient documentation

## 2013-01-28 DIAGNOSIS — Z8679 Personal history of other diseases of the circulatory system: Secondary | ICD-10-CM | POA: Insufficient documentation

## 2013-01-28 DIAGNOSIS — I10 Essential (primary) hypertension: Secondary | ICD-10-CM | POA: Insufficient documentation

## 2013-01-28 DIAGNOSIS — I482 Chronic atrial fibrillation, unspecified: Secondary | ICD-10-CM

## 2013-01-28 DIAGNOSIS — Z8709 Personal history of other diseases of the respiratory system: Secondary | ICD-10-CM | POA: Insufficient documentation

## 2013-01-28 DIAGNOSIS — Z87891 Personal history of nicotine dependence: Secondary | ICD-10-CM | POA: Insufficient documentation

## 2013-01-28 LAB — URINALYSIS, ROUTINE W REFLEX MICROSCOPIC
Ketones, ur: NEGATIVE mg/dL
Leukocytes, UA: NEGATIVE
Nitrite: NEGATIVE
Protein, ur: NEGATIVE mg/dL
Urobilinogen, UA: 0.2 mg/dL (ref 0.0–1.0)

## 2013-01-28 LAB — COMPREHENSIVE METABOLIC PANEL
Albumin: 3.7 g/dL (ref 3.5–5.2)
BUN: 19 mg/dL (ref 6–23)
Chloride: 101 mEq/L (ref 96–112)
Creatinine, Ser: 0.88 mg/dL (ref 0.50–1.10)
GFR calc Af Amer: 70 mL/min — ABNORMAL LOW (ref >90)
GFR calc non Af Amer: 60 mL/min — ABNORMAL LOW (ref >90)
Glucose, Bld: 182 mg/dL — ABNORMAL HIGH (ref 70–99)
Total Bilirubin: 0.2 mg/dL — ABNORMAL LOW (ref 0.3–1.2)

## 2013-01-28 LAB — CBC
HCT: 37.6 % (ref 36.0–46.0)
Hemoglobin: 12.5 g/dL (ref 12.0–15.0)
MCV: 90.8 fL (ref 78.0–100.0)
RDW: 12.5 % (ref 11.5–15.5)
WBC: 6.3 10*3/uL (ref 4.0–10.5)

## 2013-01-28 LAB — DIGOXIN LEVEL: Digoxin Level: 0.3 ng/mL — ABNORMAL LOW (ref 0.8–2.0)

## 2013-01-28 NOTE — ED Notes (Signed)
Patient from [facility] was lying on couch and fell off.  This was unwitnessed.  EMS reports patient is in Alzheimers Unit but that she has been more confused than usual recently.  Not sure if she fell while trying to stand or just fell in her sleep.

## 2013-01-28 NOTE — ED Notes (Signed)
[id number]<AV> Expected date:<BR> Expected time:<BR> Means of arrival:<BR> Comments:<BR> EMS-UTI

## 2013-01-28 NOTE — ED Notes (Signed)
Patient taken off backboard using three person assist.  No c/o pain

## 2013-01-28 NOTE — ED Notes (Signed)
MD at bedside. 

## 2013-01-28 NOTE — ED Provider Notes (Signed)
CSN: [id number]     Arrival date & time 01/28/13  1057 History     First MD Initiated Contact with Patient 01/28/13 1106     Chief Complaint  Patient presents with  . Fall   (Consider location/radiation/quality/duration/timing/severity/associated sxs/prior Treatment) Patient is a 79 y.o. female presenting with fall. The history is provided by the patient and the EMS personnel. The history is limited by the condition of the patient.  Fall  pt with hx dementia, presents post fall at ecf.  Pt was watching tv lying on a couch, she had fallen asleep. Pt was found next to couch on ground. Pt with hx dementia, unable to give much additional information - level 5 caveat.  Pt denies feeling faint or dizzy. No cp or sob. No abd pain. No headache, neck or back pain.     Past Medical History  Diagnosis Date  . Allergy     allergic Rhintis  . Hyperlipidemia   . Hypertension   . Osteoporosis   . PAF (paroxysmal atrial fibrillation)     a. PAF dates back > 5 yrs, prev on coumadin->d/c 2006 2/2 GIB;   . Carotid stenosis     a. 09/2007: Carotid Doppler stable- stable bilateral stenosis 40-59% (from 06)  . Type II diabetes mellitus   . Tobacco abuse     a. ongoing - 50+ pack yr hx.  . Dementia     Dementia in Hospital //Does O.K. at home.  . Diverticulosis   . GI bleeding     a. when on coumadin in 2006 - colonoscopy @ that time showed 2 large right colon avm's, multiple small bowel avm's, fresh blood in prox small bowel.  . AVM (arteriovenous malformation) of colon     a. 2006 colonscopy  . Chronic respiratory failure   . COPD (chronic obstructive pulmonary disease)   . Chronic diastolic CHF (congestive heart failure)   . Normocytic anemia 03/2012  . Complication of anesthesia   . PONV (postoperative nausea and vomiting)   . Shortness of breath   . Pneumonia     MULTIPLE TIMES"  . Heart murmur   . GERD (gastroesophageal reflux disease)    Past Surgical History  Procedure Laterality  Date  . Cardiac catheterization  1990's    clear  . Orif tibia & fibula fractures  05/2004  . Small bowel enteroscopy  03/2005    AVMS  . Colonoscopy  08/2004  . Esophagogastroduodenoscopy  08/2004    Multiple    Family History  Problem Relation Age of Onset  . Stroke Mother     a. believes she died suddenly @ 44.  . Diabetes Mother   . Stroke Father     Died from cerebral hemorrhage @ 87  . Diabetes Father   . Cancer Sister     colon and stomach cancer  . Diabetes Brother   . Heart disease Brother   . Cancer Brother     died from lung cancer  . Heart disease Brother     MI  . Cancer Sister     ? liver//brain cancer  . Heart disease Sister   . Diabetes Sister     diabetes and glaucoma  . Cancer Brother     pancreatic cancer  . Diabetes Son    History  Substance Use Topics  . Smoking status: Former Smoker -- 0.50 packs/day for 50 years    Types: Cigarettes    Quit date: 04/21/2012  . Smokeless tobacco:  Never Used  . Alcohol Use: No   OB History   Grav Para Term Preterm Abortions TAB SAB Ect Mult Living                 Review of Systems  Unable to perform ROS: Dementia  level 5 caveat.   Allergies  Azithromycin; Codeine; Coumadin; and Oxybutynin chloride er  Home Medications   Current Outpatient Rx  Name  Route  Sig  Dispense  Refill  . aspirin 81 MG tablet   Oral   Take 81 mg by mouth every morning.          . budesonide (PULMICORT FLEXHALER) 180 MCG/ACT inhaler   Inhalation   Inhale 1 puff into the lungs 2 (two) times daily.   1 Inhaler   6   . Cholecalciferol (VITAMIN D-3) 1000 UNITS CAPS   Oral   Take 1 capsule by mouth every morning.          . digoxin (LANOXIN) 0.125 MG tablet   Oral   Take 0.125 mg by mouth every other day.         . diltiazem (CARDIZEM CD) 240 MG 24 hr capsule   Oral   Take 240 mg by mouth every morning.          . docusate sodium (COLACE) 100 MG capsule   Oral   Take 1 capsule (100 mg total) by mouth 2  (two) times daily as needed for constipation.         [name] donepezil (ARICEPT) 10 MG tablet   Oral   Take 10 mg by mouth at bedtime.         . fluticasone (FLONASE) 50 MCG/ACT nasal spray   Nasal   Place 2 sprays into the nose daily as needed for allergies.         [name] lansoprazole (PREVACID) 15 MG capsule   Oral   Take 1 capsule (15 mg total) by mouth daily.   90 capsule   1   . levalbuterol (XOPENEX HFA) 45 MCG/ACT inhaler   Inhalation   Inhale 1-2 puffs into the lungs every 4 (four) hours as needed for wheezing or shortness of breath.          . magnesium hydroxide (MILK OF MAGNESIA) 400 MG/5ML suspension   Oral   Take 15 mLs by mouth daily as needed for constipation.         . metFORMIN (GLUCOPHAGE) 500 MG tablet   Oral   Take 1 tablet (500 mg total) by mouth 3 (three) times daily.   90 tablet   11   . mirtazapine (REMERON) 15 MG tablet   Oral   Take 15 mg by mouth at bedtime.         . polyethylene glycol powder (GLYCOLAX/MIRALAX) powder   Oral   Take 17 g by mouth daily as needed (constipation).          [name] tiotropium (SPIRIVA HANDIHALER) 18 MCG inhalation capsule   Inhalation   Place 1 capsule (18 mcg total) into inhaler and inhale daily.   30 capsule   5   . vitamin B-12 (CYANOCOBALAMIN) 1000 MCG tablet   Oral   Take 1,000 mcg by mouth every morning.          . levalbuterol (XOPENEX) 0.63 MG/3ML nebulizer solution   Nebulization   Take 1 ampule by nebulization 2 (two) times daily.           BP 152/91  Pulse 78  Temp(Src) 97.6 F (36.4 C) (Oral)  Resp 18  SpO2 99% Physical Exam  Nursing note and vitals reviewed. Constitutional: She appears well-developed and well-nourished. No distress.  HENT:  Head: Atraumatic.  Mouth/Throat: Oropharynx is clear and moist.  Eyes: Conjunctivae are normal. Pupils are equal, round, and reactive to light. No scleral icterus.  Neck: Normal range of motion. Neck supple. No tracheal deviation present.   Cardiovascular: Normal rate, regular rhythm, normal heart sounds and intact distal pulses.   Pulmonary/Chest: Effort normal and breath sounds normal. No respiratory distress. She exhibits no tenderness.  Abdominal: Soft. Normal appearance. She exhibits no distension. There is no tenderness.  Musculoskeletal: She exhibits no edema and no tenderness.  Good rom bil ext, no focal bony tenderness.  CTLS spine, non tender, aligned, no step off.   Neurological: She is alert.  Smiling, alert, content. Moves bil ext purposefully w good strength.   Skin: Skin is warm and dry. No rash noted.  Psychiatric: She has a normal mood and affect.    ED Course   Procedures (including critical care time)  Results for orders placed during the hospital encounter of 01/28/13  CBC      Result Value Range   WBC 6.3  4.0 - 10.5 K/uL   RBC 4.14  3.87 - 5.11 MIL/uL   Hemoglobin 12.5  12.0 - 15.0 g/dL   HCT 45.4  09.8 - 11.9 %   MCV 90.8  78.0 - 100.0 fL   MCH 30.2  26.0 - 34.0 pg   MCHC 33.2  30.0 - 36.0 g/dL   RDW 14.7  82.9 - 56.2 %   Platelets 232  150 - 400 K/uL  COMPREHENSIVE METABOLIC PANEL      Result Value Range   Sodium 140  135 - 145 mEq/L   Potassium 4.0  3.5 - 5.1 mEq/L   Chloride 101  96 - 112 mEq/L   CO2 31  19 - 32 mEq/L   Glucose, Bld 182 (*) 70 - 99 mg/dL   BUN 19  6 - 23 mg/dL   Creatinine, Ser 1.30  0.50 - 1.10 mg/dL   Calcium 9.9  8.4 - 86.5 mg/dL   Total Protein 6.4  6.0 - 8.3 g/dL   Albumin 3.7  3.5 - 5.2 g/dL   AST 10  0 - 37 U/L   ALT 7  0 - 35 U/L   Alkaline Phosphatase 57  39 - 117 U/L   Total Bilirubin 0.2 (*) 0.3 - 1.2 mg/dL   GFR calc non Af Amer 60 (*) >90 mL/min   GFR calc Af Amer 70 (*) >90 mL/min  URINALYSIS, ROUTINE W REFLEX MICROSCOPIC      Result Value Range   Color, Urine YELLOW  YELLOW   APPearance CLOUDY (*) CLEAR   Specific Gravity, Urine 1.014  1.005 - 1.030   pH 7.0  5.0 - 8.0   Glucose, UA NEGATIVE  NEGATIVE mg/dL   Hgb urine dipstick NEGATIVE   NEGATIVE   Bilirubin Urine NEGATIVE  NEGATIVE   Ketones, ur NEGATIVE  NEGATIVE mg/dL   Protein, ur NEGATIVE  NEGATIVE mg/dL   Urobilinogen, UA 0.2  0.0 - 1.0 mg/dL   Nitrite NEGATIVE  NEGATIVE   Leukocytes, UA NEGATIVE  NEGATIVE  DIGOXIN LEVEL      Result Value Range   Digoxin Level 0.3 (*) 0.8 - 2.0 ng/mL        MDM  Labs.  Reviewed nursing notes and  prior charts for additional history.   Recheck remains awake and alert. No sign of trauma on exam, no sts, tenderness or c/o pain. pts mental status c/w baseline.   Labs neg acute.   bp 152/91, hr 78, rr 18, pulse ox 99%.  Pt appears stable for d/c.   Suzi Roots, MD 01/28/13 1310

## 2013-02-11 ENCOUNTER — Telehealth: Payer: Self-pay | Admitting: *Deleted

## 2013-02-11 NOTE — Telephone Encounter (Signed)
Pt's daughter-n-law called back and requested that we send that form to Dr. Kalman Shan since he manages her inhalers so he can decide if we should change inhaler or do a PA on pt's Xopenex.  Form faxed to Dr. Jane Canary office to review

## 2013-02-11 NOTE — Telephone Encounter (Signed)
Received fax from pharmacy that pt's insurance will not cover Xopenex 45 mcg, but it will cover ProAir 90 mcg with the same directions. Do you authorize the change? If so they need new rx.

## 2013-02-11 NOTE — Telephone Encounter (Signed)
Spoke with [facility] and they are okay with changing inhaler to ProAir , form placed back in your inbox.   [name] also said that pt is having labs drawn at nursing home that should be sent to you soon, [name] request that we call them with the results and let them know if she needs to make a f/u appt with you based on her lab results since they want to keep you as her PCP even though pt is in nursing home

## 2013-02-12 ENCOUNTER — Encounter: Payer: Self-pay | Admitting: Family Medicine

## 2013-02-15 ENCOUNTER — Encounter: Payer: Self-pay | Admitting: *Deleted

## 2013-02-24 ENCOUNTER — Ambulatory Visit: Payer: Medicare Other | Admitting: Pulmonary Disease

## 2013-03-04 NOTE — Telephone Encounter (Signed)
PA for xxopenex signed and sent to Carron Curie my CMA  Dr. Kalman Shan, M.D., [facility].C.P Pulmonary and Critical Care Medicine Staff Physician [facility] [place] [facility] Pager: [phone], If no answer or between  15:00h - 7:00h: call [phone]  03/04/2013 4:55 PM

## 2013-03-05 ENCOUNTER — Telehealth: Payer: Self-pay | Admitting: Internal Medicine

## 2013-03-05 NOTE — Telephone Encounter (Signed)
After reviewing the form it was not a PA there was a number to call to initiate PA [id number]. I gave all clinical info over the phone and PA was sent to pharmacy to review. I will await approval/denial.  Confirmation # [id number]  Pt ID # [id number]

## 2013-03-05 NOTE — Telephone Encounter (Signed)
Ok go woth pro-air 2 puff prn  Dr. Kalman Shan, M.D., [facility].C.P Pulmonary and Critical Care Medicine Staff Physician [facility] [place] [facility] Pager: [phone], If no answer or between  15:00h - 7:00h: call [phone]  03/05/2013 4:47 PM

## 2013-03-05 NOTE — Telephone Encounter (Signed)
This is a duplicate message. I am working on getting medication approved. Pt daughter aware. Carron Curie, CMA

## 2013-03-05 NOTE — Telephone Encounter (Signed)
I spoke with pt daughter. She stated the xopenex is no longer covered. Alternative is proair. Please advise MR thanks

## 2013-03-09 NOTE — Telephone Encounter (Signed)
Received approval for medication. Pharmacy is aware. Carron Curie, CMA

## 2013-05-12 ENCOUNTER — Emergency Department (HOSPITAL_COMMUNITY)
Admission: EM | Admit: 2013-05-12 | Discharge: 2013-05-12 | Disposition: A | Discharge status: Home / Self Care | Payer: Medicare Other | Attending: Emergency Medicine | Admitting: Emergency Medicine

## 2013-05-12 ENCOUNTER — Emergency Department (HOSPITAL_COMMUNITY): Payer: Medicare Other

## 2013-05-12 ENCOUNTER — Encounter (HOSPITAL_COMMUNITY): Payer: Self-pay | Admitting: Emergency Medicine

## 2013-05-12 DIAGNOSIS — K219 Gastro-esophageal reflux disease without esophagitis: Secondary | ICD-10-CM | POA: Insufficient documentation

## 2013-05-12 DIAGNOSIS — E119 Type 2 diabetes mellitus without complications: Secondary | ICD-10-CM | POA: Insufficient documentation

## 2013-05-12 DIAGNOSIS — R079 Chest pain, unspecified: Secondary | ICD-10-CM

## 2013-05-12 DIAGNOSIS — Z7982 Long term (current) use of aspirin: Secondary | ICD-10-CM | POA: Insufficient documentation

## 2013-05-12 DIAGNOSIS — I509 Heart failure, unspecified: Secondary | ICD-10-CM | POA: Insufficient documentation

## 2013-05-12 DIAGNOSIS — Z87891 Personal history of nicotine dependence: Secondary | ICD-10-CM | POA: Insufficient documentation

## 2013-05-12 DIAGNOSIS — E7800 Pure hypercholesterolemia, unspecified: Secondary | ICD-10-CM | POA: Insufficient documentation

## 2013-05-12 DIAGNOSIS — R55 Syncope and collapse: Secondary | ICD-10-CM | POA: Insufficient documentation

## 2013-05-12 DIAGNOSIS — J441 Chronic obstructive pulmonary disease with (acute) exacerbation: Secondary | ICD-10-CM | POA: Insufficient documentation

## 2013-05-12 DIAGNOSIS — Z79899 Other long term (current) drug therapy: Secondary | ICD-10-CM | POA: Insufficient documentation

## 2013-05-12 DIAGNOSIS — R011 Cardiac murmur, unspecified: Secondary | ICD-10-CM | POA: Insufficient documentation

## 2013-05-12 DIAGNOSIS — I10 Essential (primary) hypertension: Secondary | ICD-10-CM | POA: Insufficient documentation

## 2013-05-12 LAB — COMPREHENSIVE METABOLIC PANEL
ALT: 8 U/L (ref 0–35)
BUN: 24 mg/dL — ABNORMAL HIGH (ref 6–23)
CO2: 29 mEq/L (ref 19–32)
Calcium: 9.4 mg/dL (ref 8.4–10.5)
GFR calc Af Amer: 70 mL/min — ABNORMAL LOW (ref >90)
GFR calc non Af Amer: 60 mL/min — ABNORMAL LOW (ref >90)
Glucose, Bld: 127 mg/dL — ABNORMAL HIGH (ref 70–99)
Sodium: 141 mEq/L (ref 135–145)

## 2013-05-12 LAB — CBC WITH DIFFERENTIAL/PLATELET
Eosinophils Relative: 1 % (ref 0–5)
Lymphocytes Relative: 32 % (ref 12–46)
Lymphs Abs: 2.2 10*3/uL (ref 0.7–4.0)
MCH: 30.3 pg (ref 26.0–34.0)
MCHC: 33.5 g/dL (ref 30.0–36.0)
Monocytes Absolute: 0.4 10*3/uL (ref 0.1–1.0)
Monocytes Relative: 6 % (ref 3–12)
Platelets: 231 10*3/uL (ref 150–400)
RDW: 12.8 % (ref 11.5–15.5)

## 2013-05-12 LAB — POCT I-STAT TROPONIN I

## 2013-05-12 NOTE — ED Notes (Addendum)
Pt arrived by [employer] and [facility] from [facility] alzheimer's unit. Pt was walking with walker and started to become dizzy with left sided chest pressure and nausea. Pt then fell against wall and slid down to a sitting position against wall. Fall was witness by staff. Denies any pain at this time, no injuries noted. CP and nausea went away on its own. No diaphoresis or vomiting. Pt Afib on the monitor. EMS administered ASA 324mg . 20G Left AC.  BP-130/90 O2sat-90%ra 98%2lpm [id number]

## 2013-05-12 NOTE — ED Notes (Signed)
Family at bedside. 

## 2013-05-12 NOTE — ED Provider Notes (Signed)
CSN: [id number]     Arrival date & time 05/12/13  4098 History   First MD Initiated Contact with Patient 05/12/13 [phone]     Chief Complaint  Patient presents with  . Near Syncope  . Chest Pain   (Consider location/radiation/quality/duration/timing/severity/associated sxs/prior Treatment) HPI Comments: Patient is an 79 year old female with extensive past medical history including hypertension, atrial fibrillation, COPD, dementia. She is brought today by EMS after experiencing an episode of what is believed to be chest pain. She was using her walker to ambulate to the dining area of her extended care facility when she stated she had discomfort in her chest then leaned up against the wall and slid to the floor. She denies any injury or trauma. She denies any current chest pain, shortness of breath and has no complaints at present. Due to her dementia, she has little recollection of the event and a level V caveat applies.  The history is provided by the patient.    Past Medical History  Diagnosis Date  . Allergy     allergic Rhintis  . Hyperlipidemia   . Hypertension   . Osteoporosis   . PAF (paroxysmal atrial fibrillation)     a. PAF dates back > 5 yrs, prev on coumadin->d/c 2006 2/2 GIB;   . Carotid stenosis     a. 09/2007: Carotid Doppler stable- stable bilateral stenosis 40-59% (from 06)  . Type II diabetes mellitus   . Tobacco abuse     a. ongoing - 50+ pack yr hx.  . Dementia     Dementia in Hospital //Does O.K. at home.  . Diverticulosis   . GI bleeding     a. when on coumadin in 2006 - colonoscopy @ that time showed 2 large right colon avm's, multiple small bowel avm's, fresh blood in prox small bowel.  . AVM (arteriovenous malformation) of colon     a. 2006 colonscopy  . Chronic respiratory failure   . COPD (chronic obstructive pulmonary disease)   . Chronic diastolic CHF (congestive heart failure)   . Normocytic anemia 03/2012  . Complication of anesthesia   . PONV  (postoperative nausea and vomiting)   . Shortness of breath   . Pneumonia     MULTIPLE TIMES"  . Heart murmur   . GERD (gastroesophageal reflux disease)    Past Surgical History  Procedure Laterality Date  . Cardiac catheterization  1990's    clear  . Orif tibia & fibula fractures  05/2004  . Small bowel enteroscopy  03/2005    AVMS  . Colonoscopy  08/2004  . Esophagogastroduodenoscopy  08/2004    Multiple    Family History  Problem Relation Age of Onset  . Stroke Mother     a. believes she died suddenly @ 34.  . Diabetes Mother   . Stroke Father     Died from cerebral hemorrhage @ 26  . Diabetes Father   . Cancer Sister     colon and stomach cancer  . Diabetes Brother   . Heart disease Brother   . Cancer Brother     died from lung cancer  . Heart disease Brother     MI  . Cancer Sister     ? liver//brain cancer  . Heart disease Sister   . Diabetes Sister     diabetes and glaucoma  . Cancer Brother     pancreatic cancer  . Diabetes Son    History  Substance Use Topics  . Smoking status:  Former Smoker -- 0.50 packs/day for 50 years    Types: Cigarettes    Quit date: 04/21/2012  . Smokeless tobacco: Never Used  . Alcohol Use: No   OB History   Grav Para Term Preterm Abortions TAB SAB Ect Mult Living                 Review of Systems  All other systems reviewed and are negative.    Allergies  Azithromycin; Codeine; Coumadin; and Oxybutynin chloride er  Home Medications   Current Outpatient Rx  Name  Route  Sig  Dispense  Refill  . aspirin 81 MG tablet   Oral   Take 81 mg by mouth every morning.          . budesonide (PULMICORT FLEXHALER) 180 MCG/ACT inhaler   Inhalation   Inhale 1 puff into the lungs 2 (two) times daily.   1 Inhaler   6   . Cholecalciferol (VITAMIN D-3) 1000 UNITS CAPS   Oral   Take 1 capsule by mouth daily.          . digoxin (LANOXIN) 0.125 MG tablet   Oral   Take 0.125 mg by mouth every other day.         .  diltiazem (CARDIZEM CD) 240 MG 24 hr capsule   Oral   Take 240 mg by mouth every morning.          . docusate sodium (COLACE) 100 MG capsule   Oral   Take 1 capsule (100 mg total) by mouth 2 (two) times daily as needed for constipation.         [name] donepezil (ARICEPT) 10 MG tablet   Oral   Take 10 mg by mouth daily.          . fluticasone (FLONASE) 50 MCG/ACT nasal spray   Nasal   Place 2 sprays into the nose daily as needed for allergies.         [name] lansoprazole (PREVACID) 15 MG capsule   Oral   Take 1 capsule (15 mg total) by mouth daily.   90 capsule   1   . levalbuterol (XOPENEX HFA) 45 MCG/ACT inhaler   Inhalation   Inhale 1-2 puffs into the lungs every 4 (four) hours as needed for wheezing or shortness of breath.          . magnesium hydroxide (MILK OF MAGNESIA) 400 MG/5ML suspension   Oral   Take 15 mLs by mouth daily as needed for constipation.         . metFORMIN (GLUCOPHAGE) 500 MG tablet   Oral   Take 1 tablet (500 mg total) by mouth 3 (three) times daily.   90 tablet   11   . mirtazapine (REMERON) 15 MG tablet   Oral   Take 15 mg by mouth at bedtime.         . polyethylene glycol powder (GLYCOLAX/MIRALAX) powder   Oral   Take 17 g by mouth daily as needed (constipation).          . Protein POWD   Oral   Take 2 scoop by mouth 3 (three) times daily.         [name] tiotropium (SPIRIVA HANDIHALER) 18 MCG inhalation capsule   Inhalation   Place 1 capsule (18 mcg total) into inhaler and inhale daily.   30 capsule   5   . vitamin B-12 (CYANOCOBALAMIN) 1000 MCG tablet   Oral   Take 1,000 mcg by  mouth every morning.           There were no vitals taken for this visit. Physical Exam  Nursing note and vitals reviewed. Constitutional: She is oriented to person, place, and time. She appears well-developed and well-nourished. No distress.  HENT:  Head: Normocephalic and atraumatic.  Neck: Normal range of motion. Neck supple.  Cardiovascular:  Exam reveals no gallop and no friction rub.   No murmur heard. Heart is irregularly irregular.  Pulmonary/Chest: Effort normal and breath sounds normal. No respiratory distress. She has no wheezes.  Abdominal: Soft. Bowel sounds are normal. She exhibits no distension. There is no tenderness.  Musculoskeletal: Normal range of motion.  Neurological: She is alert and oriented to person, place, and time.  Skin: Skin is warm and dry. She is not diaphoretic.  Psychiatric: She has a normal mood and affect.    ED Course  Procedures (including critical care time) Labs Review Labs Reviewed  CBC WITH DIFFERENTIAL  COMPREHENSIVE METABOLIC PANEL  TROPONIN I   Imaging Review No results found.  EKG Interpretation     Ventricular Rate:  72 PR Interval:    QRS Duration: 83 QT Interval:  428 QTC Calculation: 468 R Axis:   16 Text Interpretation:  Atrial fibrillation Anteroseptal infarct, age indeterminate            MDM  No diagnosis found. Patient is an 79 year old female with history of dementia and diabetes. She was brought here after an episode near syncope and possible chest discomfort that occurred at the extended care facility. She appears well vital signs are unremarkable. She has no complaints and a normal physical examination. Workup reveals an unchanged EKG and troponin which is negative x2. She is remained pain-free while in the emergency department. At this point I feel as though she is stable for discharge. She is to followup as needed if she experiences any problems.    Geoffery Lyons, MD 05/12/13 1235

## 2013-06-01 ENCOUNTER — Encounter: Payer: Self-pay | Admitting: Family Medicine

## 2013-06-01 ENCOUNTER — Ambulatory Visit (INDEPENDENT_AMBULATORY_CARE_PROVIDER_SITE_OTHER): Payer: Medicare Other | Admitting: Family Medicine

## 2013-06-01 VITALS — BP 122/76 | HR 75 | Temp 97.8°F | Ht 65.0 in | Wt 138.5 lb

## 2013-06-01 DIAGNOSIS — F329 Major depressive disorder, single episode, unspecified: Secondary | ICD-10-CM

## 2013-06-01 DIAGNOSIS — E7800 Pure hypercholesterolemia, unspecified: Secondary | ICD-10-CM

## 2013-06-01 DIAGNOSIS — I10 Essential (primary) hypertension: Secondary | ICD-10-CM

## 2013-06-01 DIAGNOSIS — F0390 Unspecified dementia without behavioral disturbance: Secondary | ICD-10-CM

## 2013-06-01 DIAGNOSIS — E119 Type 2 diabetes mellitus without complications: Secondary | ICD-10-CM

## 2013-06-01 LAB — COMPREHENSIVE METABOLIC PANEL
ALT: 10 U/L (ref 0–35)
CO2: 29 mEq/L (ref 19–32)
Creatinine, Ser: 1 mg/dL (ref 0.4–1.2)
GFR: 56.5 mL/min — ABNORMAL LOW (ref >60.00)
Glucose, Bld: 124 mg/dL — ABNORMAL HIGH (ref 70–99)
Total Bilirubin: 0.6 mg/dL (ref 0.3–1.2)

## 2013-06-01 LAB — HEMOGLOBIN A1C: Hgb A1c MFr Bld: 7.4 % — ABNORMAL HIGH (ref 4.6–6.5)

## 2013-06-01 MED ORDER — ATORVASTATIN CALCIUM 10 MG PO TABS: 10.0000 mg | ORAL_TABLET | Freq: Every day | ORAL | Status: DC

## 2013-06-01 NOTE — Patient Instructions (Addendum)
Continue the meals and supplements Encourage use of walker at all times Labs today  Check sugar if needed Start back on lipitor 10 mg Follow up with me in 6 months please   For pain- please give tylenol 650 mg up to every 4-6 hours as needed

## 2013-06-01 NOTE — Progress Notes (Signed)
Subjective:    Patient ID: [name], female    DOB: 13-Aug-1929, 79 y.o.   MRN: [id number]  HPI Here for f/u of chronic problems  In a facility now - and is eating regular meals  Also carnation instant bkfast and protien powder  Her wt is up 2-3 lb -overall doing much better    Dementia-worsening  On aricept and tolerates that  More active and social now that she is in a facility however  remeron seems to be helping mood and appetite a bit    Is having some pain in R hip /pelvis area and knee and elbow -- ? Unsure if that was from the fall  One trip to ED- cp and fall - everything checks out ok  She actually leaned against a wall and slid down -no impact and did not hit head or spine    Lab Results  Component Value Date   CHOL 130 06/17/2012   CHOL 138 06/15/2012   CHOL 129 04/22/2012   Lab Results  Component Value Date   HDL 43 06/17/2012   HDL 46.90 06/15/2012   HDL 51 04/22/2012   Lab Results  Component Value Date   LDLCALC 51 06/17/2012   LDLCALC 64 06/15/2012   LDLCALC 54 04/22/2012   Lab Results  Component Value Date   TRIG 180* 06/17/2012   TRIG 134.0 06/15/2012   TRIG 121 04/22/2012   Lab Results  Component Value Date   CHOLHDL 3.0 06/17/2012   CHOLHDL 3 06/15/2012   CHOLHDL 2.5 04/22/2012   No results found for this basename: LDLDIRECT   cholesterol - had [facility] draw last week  Trig 164 HDL 59 LDL 157 Will put her back on atorvastain 10 mg and update if side eff    afib seems stable   bp is stable today  No cp or palpitations or headaches or edema  No side effects to medicines  BP Readings from Last 3 Encounters:  06/01/13 122/76  05/12/13 113/70  01/28/13 160/74       Lab Results  Component Value Date   HGBA1C 7.4* 06/16/2012    Getting metformin - due for A1C Only checking sugar at her facility if she seems to have symptoms of hypoglycemia (no reported hypoglycemia)  She has furosemide to take as needed - does not feel  very swollen and has not needed it   Patient Active Problem List   Diagnosis Date Noted  . Near syncope 09/30/2012  . Headache 09/30/2012  . Failure to thrive in adult 09/25/2012  . Depression 09/25/2012  . Abdominal pain, lower 09/25/2012  . Vitamin B12 deficiency 09/08/2012  . Hypertrophic toenail 07/21/2012  . OSA (obstructive sleep apnea) 07/15/2012  . Urine frequency 07/03/2012  . PE (pulmonary embolism) 06/17/2012  . Chest pain 06/16/2012  . Esophageal dysmotility suspected 06/15/2012  . GERD (gastroesophageal reflux disease) 06/15/2012  . Acute and chronic respiratory failure 04/23/2012  . Dementia 04/21/2012  . Acute diastolic CHF (congestive heart failure) 04/21/2012  . COPD (chronic obstructive pulmonary disease) 04/21/2012  . Mixed incontinence urge and stress 01/04/2011  . UNSPECIFIED VITAMIN D DEFICIENCY 07/14/2010  . ANEMIA 01/24/2009  . Former smoker 11/04/2007  . MORTON'S NEUROMA 11/03/2007  . MACULAR DEGENERATION 11/03/2007  . HYPERTENSION 11/03/2007  . Atrial fibrillation 11/03/2007  . ALLERGIC RHINITIS 11/03/2007  . OSTEOPOROSIS 11/03/2007  . DIABETES MELLITUS, TYPE II 11/03/2007  . HYPERCHOLESTEROLEMIA 11/25/2006   Past Medical History  Diagnosis Date  . Allergy  allergic Rhintis  . Hyperlipidemia   . Hypertension   . Osteoporosis   . PAF (paroxysmal atrial fibrillation)     a. PAF dates back > 5 yrs, prev on coumadin->d/c 2006 2/2 GIB;   . Carotid stenosis     a. 09/2007: Carotid Doppler stable- stable bilateral stenosis 40-59% (from 06)  . Type II diabetes mellitus   . Tobacco abuse     a. ongoing - 50+ pack yr hx.  . Dementia     Dementia in Hospital //Does O.K. at home.  . Diverticulosis   . GI bleeding     a. when on coumadin in 2006 - colonoscopy @ that time showed 2 large right colon avm's, multiple small bowel avm's, fresh blood in prox small bowel.  . AVM (arteriovenous malformation) of colon     a. 2006 colonscopy  . Chronic  respiratory failure   . COPD (chronic obstructive pulmonary disease)   . Chronic diastolic CHF (congestive heart failure)   . Normocytic anemia 03/2012  . Complication of anesthesia   . PONV (postoperative nausea and vomiting)   . Shortness of breath   . Pneumonia     MULTIPLE TIMES"  . Heart murmur   . GERD (gastroesophageal reflux disease)    Past Surgical History  Procedure Laterality Date  . Cardiac catheterization  1990's    clear  . Orif tibia & fibula fractures  05/2004  . Small bowel enteroscopy  03/2005    AVMS  . Colonoscopy  08/2004  . Esophagogastroduodenoscopy  08/2004    Multiple    History  Substance Use Topics  . Smoking status: Former Smoker -- 0.50 packs/day for 50 years    Types: Cigarettes    Quit date: 04/21/2012  . Smokeless tobacco: Never Used  . Alcohol Use: No   Family History  Problem Relation Age of Onset  . Stroke Mother     a. believes she died suddenly @ 55.  . Diabetes Mother   . Stroke Father     Died from cerebral hemorrhage @ 5  . Diabetes Father   . Cancer Sister     colon and stomach cancer  . Diabetes Brother   . Heart disease Brother   . Cancer Brother     died from lung cancer  . Heart disease Brother     MI  . Cancer Sister     ? liver//brain cancer  . Heart disease Sister   . Diabetes Sister     diabetes and glaucoma  . Cancer Brother     pancreatic cancer  . Diabetes Son    Allergies  Allergen Reactions  . Azithromycin Other (See Comments)    inc blood sugar  . Codeine Itching  . Coumadin [Warfarin] Other (See Comments)    Causes bleeding  . Oxybutynin Chloride Er Other (See Comments)    Constipation/ dizziness/ dry mouth   Current Outpatient Prescriptions on File Prior to Visit  Medication Sig Dispense Refill  . aspirin 81 MG tablet Take 81 mg by mouth every morning.       . budesonide (PULMICORT FLEXHALER) 180 MCG/ACT inhaler Inhale 1 puff into the lungs 2 (two) times daily.  1 Inhaler  6  .  Cholecalciferol (VITAMIN D-3) 1000 UNITS CAPS Take 1 capsule by mouth daily.       . digoxin (LANOXIN) 0.125 MG tablet Take 0.125 mg by mouth every other day.      . diltiazem (CARDIZEM CD) 240 MG 24  hr capsule Take 240 mg by mouth every morning.       . docusate sodium (COLACE) 100 MG capsule Take 1 capsule (100 mg total) by mouth 2 (two) times daily as needed for constipation.      [name] donepezil (ARICEPT) 10 MG tablet Take 10 mg by mouth daily.       . fluticasone (FLONASE) 50 MCG/ACT nasal spray Place 2 sprays into the nose daily as needed for allergies.      [name] lansoprazole (PREVACID) 15 MG capsule Take 1 capsule (15 mg total) by mouth daily.  90 capsule  1  . levalbuterol (XOPENEX HFA) 45 MCG/ACT inhaler Inhale 1-2 puffs into the lungs every 4 (four) hours as needed for wheezing or shortness of breath.       . magnesium hydroxide (MILK OF MAGNESIA) 400 MG/5ML suspension Take 15 mLs by mouth daily as needed for constipation.      . metFORMIN (GLUCOPHAGE) 500 MG tablet Take 1 tablet (500 mg total) by mouth 3 (three) times daily.  90 tablet  11  . mirtazapine (REMERON) 15 MG tablet Take 15 mg by mouth at bedtime.      . polyethylene glycol powder (GLYCOLAX/MIRALAX) powder Take 17 g by mouth daily as needed (constipation).       . Protein POWD Take 2 scoop by mouth 3 (three) times daily.      [name] tiotropium (SPIRIVA HANDIHALER) 18 MCG inhalation capsule Place 1 capsule (18 mcg total) into inhaler and inhale daily.  30 capsule  5  . vitamin B-12 (CYANOCOBALAMIN) 1000 MCG tablet Take 1,000 mcg by mouth every morning.        No current facility-administered medications on file prior to visit.     Review of Systems Review of Systems  Constitutional: Negative for fever, appetite change, fatigue and unexpected weight change. (wt has improved) Eyes: Negative for pain and visual disturbance.  Respiratory: Negative for cough and shortness of breath.   Cardiovascular: Negative for cp or palpitations      Gastrointestinal: Negative for nausea, diarrhea and constipation.  Genitourinary: Negative for urgency and frequency.  Skin: Negative for pallor or rash   MSK pt c/o R leg and elbow pain / neg for swollen joints or gait change  Neurological: Negative for weakness, light-headedness, numbness and headaches.  Hematological: Negative for adenopathy. Does not bruise/bleed easily.  Psychiatric/Behavioral: pos for depression anxiety and also dementia with some agitation at times- overall improved on current medicines         Objective:   Physical Exam  Constitutional: She appears well-developed and well-nourished. No distress.  Frail appearing elderly female with walker - appearance is improved from last time overall - brighter affect today  HENT:  Head: Normocephalic and atraumatic.  Mouth/Throat: Oropharynx is clear and moist.  Eyes: Conjunctivae and EOM are normal. Pupils are equal, round, and reactive to light. Right eye exhibits no discharge. Left eye exhibits no discharge. No scleral icterus.  Neck: Normal range of motion. Neck supple. No JVD present. Carotid bruit is not present. No thyromegaly present.  Cardiovascular: Normal rate and intact distal pulses.   afib   Pulmonary/Chest: Effort normal and breath sounds normal. No respiratory distress. She has no wheezes. She has no rales.  Diffusely distant bs Overall air exchange has improved since quitting smoking   Abdominal: Soft. Bowel sounds are normal. She exhibits no distension and no mass. There is no tenderness.  Musculoskeletal: She exhibits no edema and no tenderness.  No  R elbow tenderness/swelling/crepitus or rom   No hip or trochanteric or pelvic tenderness Nl rom R hip and knee  Nl gait with walker  No knee tenderness/ swelling/eff or crepitus Nl rom of knee Nl gait with walker   Lymphadenopathy:    She has no cervical adenopathy.  Neurological: She is alert. She has normal reflexes. No cranial nerve deficit. She  exhibits normal muscle tone. Coordination normal.  Skin: Skin is warm and dry. No rash noted. No erythema. No pallor.  Psychiatric:  Pleasant today Dementia apparent-pt rambles in speech tangentially Per family- this is a good day          Assessment & Plan:

## 2013-06-01 NOTE — Progress Notes (Signed)
Pre-visit discussion using our clinic review tool. No additional management support is needed unless otherwise documented below in the visit note.  

## 2013-06-02 NOTE — Assessment & Plan Note (Signed)
With rate controlled afib BP: 122/76 mmHg  bp in fair control at this time  No changes needed Disc lifstyle change with low sodium diet and exercise

## 2013-06-02 NOTE — Assessment & Plan Note (Signed)
A1C today Eating more and gaining weight No hypoglycemia Her residence will check sugar prn if needed

## 2013-06-02 NOTE — Assessment & Plan Note (Signed)
Better on remeron and in care facility  Is more active and social  He dementia progresses

## 2013-06-02 NOTE — Assessment & Plan Note (Signed)
Reviewed lipids Will re start lipitor generic 10 mg daily  If any side effects or problems will update  Will re check at 6 mo f/u  Enc wt gain and good nutrition, so no extra diet recommendations made

## 2013-06-02 NOTE — Assessment & Plan Note (Signed)
On aricept Gradually worsening  Less agitation with remeron however Is in a facility now- happier and better nutrition as well

## 2013-06-03 ENCOUNTER — Encounter: Payer: Self-pay | Admitting: *Deleted

## 2013-06-30 ENCOUNTER — Ambulatory Visit (INDEPENDENT_AMBULATORY_CARE_PROVIDER_SITE_OTHER)
Admission: RE | Admit: 2013-06-30 | Discharge: 2013-06-30 | Disposition: A | Discharge status: Home / Self Care | Payer: Medicare Other | Source: Ambulatory Visit | Attending: Family Medicine | Admitting: Family Medicine

## 2013-06-30 ENCOUNTER — Ambulatory Visit (INDEPENDENT_AMBULATORY_CARE_PROVIDER_SITE_OTHER): Payer: Medicare Other | Admitting: Family Medicine

## 2013-06-30 ENCOUNTER — Encounter: Payer: Self-pay | Admitting: Family Medicine

## 2013-06-30 VITALS — BP 124/60 | HR 74 | Temp 97.9°F | Ht 65.0 in | Wt 141.8 lb

## 2013-06-30 DIAGNOSIS — M25551 Pain in right hip: Secondary | ICD-10-CM

## 2013-06-30 DIAGNOSIS — W19XXXA Unspecified fall, initial encounter: Secondary | ICD-10-CM

## 2013-06-30 DIAGNOSIS — M25559 Pain in unspecified hip: Secondary | ICD-10-CM

## 2013-06-30 NOTE — Progress Notes (Signed)
Pre-visit discussion using our clinic review tool. No additional management support is needed unless otherwise documented below in the visit note.  

## 2013-06-30 NOTE — Progress Notes (Signed)
Subjective:    Patient ID: [name], female    DOB: [birth year], 79 y.o.   MRN: [id number]  HPI Had a fall on 12/26 - pt states she tripped over her foot (she says she was using walker)  [facility] called - and said she was out in the main room and lost balance (witnessed) and landed on R hip  No pain immediately - got up and went to bed  By Monday c/o pain over hip and groin on the R and favored her L leg   Her pain tolerance is generally high - so wanted to get checked out  Dementia continues to progress   Patient Active Problem List   Diagnosis Date Noted  . Near syncope 09/30/2012  . Headache 09/30/2012  . Failure to thrive in adult 09/25/2012  . Depression 09/25/2012  . Abdominal pain, lower 09/25/2012  . Vitamin B12 deficiency 09/08/2012  . Hypertrophic toenail 07/21/2012  . OSA (obstructive sleep apnea) 07/15/2012  . PE (pulmonary embolism) 06/17/2012  . Chest pain 06/16/2012  . Esophageal dysmotility suspected 06/15/2012  . GERD (gastroesophageal reflux disease) 06/15/2012  . Acute and chronic respiratory failure 04/23/2012  . Dementia 04/21/2012  . Acute diastolic CHF (congestive heart failure) 04/21/2012  . COPD (chronic obstructive pulmonary disease) 04/21/2012  . Mixed incontinence urge and stress 01/04/2011  . UNSPECIFIED VITAMIN D DEFICIENCY 07/14/2010  . ANEMIA 01/24/2009  . Former smoker 11/04/2007  . MORTON'S NEUROMA 11/03/2007  . MACULAR DEGENERATION 11/03/2007  . HYPERTENSION 11/03/2007  . Atrial fibrillation 11/03/2007  . ALLERGIC RHINITIS 11/03/2007  . OSTEOPOROSIS 11/03/2007  . DIABETES MELLITUS, TYPE II 11/03/2007  . HYPERCHOLESTEROLEMIA 11/25/2006   Past Medical History  Diagnosis Date  . Allergy     allergic Rhintis  . Hyperlipidemia   . Hypertension   . Osteoporosis   . PAF (paroxysmal atrial fibrillation)     a. PAF dates back > 5 yrs, prev on coumadin->d/c 2006 2/2 GIB;   . Carotid stenosis     a. 09/2007: Carotid Doppler stable-  stable bilateral stenosis 40-59% (from 06)  . Type II diabetes mellitus   . Tobacco abuse     a. ongoing - 50+ pack yr hx.  . Dementia     Dementia in Hospital //Does O.K. at home.  . Diverticulosis   . GI bleeding     a. when on coumadin in 2006 - colonoscopy @ that time showed 2 large right colon avm's, multiple small bowel avm's, fresh blood in prox small bowel.  . AVM (arteriovenous malformation) of colon     a. 2006 colonscopy  . Chronic respiratory failure   . COPD (chronic obstructive pulmonary disease)   . Chronic diastolic CHF (congestive heart failure)   . Normocytic anemia 03/2012  . Complication of anesthesia   . PONV (postoperative nausea and vomiting)   . Shortness of breath   . Pneumonia     MULTIPLE TIMES"  . Heart murmur   . GERD (gastroesophageal reflux disease)    Past Surgical History  Procedure Laterality Date  . Cardiac catheterization  1990's    clear  . Orif tibia & fibula fractures  05/2004  . Small bowel enteroscopy  03/2005    AVMS  . Colonoscopy  08/2004  . Esophagogastroduodenoscopy  08/2004    Multiple    History  Substance Use Topics  . Smoking status: Former Smoker -- 0.50 packs/day for 50 years    Types: Cigarettes    Quit date:  04/21/2012  . Smokeless tobacco: Never Used  . Alcohol Use: No   Family History  Problem Relation Age of Onset  . Stroke Mother     a. believes she died suddenly @ 107.  . Diabetes Mother   . Stroke Father     Died from cerebral hemorrhage @ 30  . Diabetes Father   . Cancer Sister     colon and stomach cancer  . Diabetes Brother   . Heart disease Brother   . Cancer Brother     died from lung cancer  . Heart disease Brother     MI  . Cancer Sister     ? liver//brain cancer  . Heart disease Sister   . Diabetes Sister     diabetes and glaucoma  . Cancer Brother     pancreatic cancer  . Diabetes Son    Allergies  Allergen Reactions  . Azithromycin Other (See Comments)    inc blood sugar  .  Codeine Itching  . Coumadin [Warfarin] Other (See Comments)    Causes bleeding  . Oxybutynin Chloride Er Other (See Comments)    Constipation/ dizziness/ dry mouth   Current Outpatient Prescriptions on File Prior to Visit  Medication Sig Dispense Refill  . aspirin 81 MG tablet Take 81 mg by mouth every morning.       [name] atorvastatin (LIPITOR) 10 MG tablet Take 1 tablet (10 mg total) by mouth daily.  90 tablet  3  . budesonide (PULMICORT FLEXHALER) 180 MCG/ACT inhaler Inhale 1 puff into the lungs 2 (two) times daily.  1 Inhaler  6  . Cholecalciferol (VITAMIN D-3) 1000 UNITS CAPS Take 1 capsule by mouth daily.       . digoxin (LANOXIN) 0.125 MG tablet Take 0.125 mg by mouth every other day.      . diltiazem (CARDIZEM CD) 240 MG 24 hr capsule Take 240 mg by mouth every morning.       . docusate sodium (COLACE) 100 MG capsule Take 1 capsule (100 mg total) by mouth 2 (two) times daily as needed for constipation.      [name] donepezil (ARICEPT) 10 MG tablet Take 10 mg by mouth daily.       . fluticasone (FLONASE) 50 MCG/ACT nasal spray Place 2 sprays into the nose daily as needed for allergies.      [name] lansoprazole (PREVACID) 15 MG capsule Take 1 capsule (15 mg total) by mouth daily.  90 capsule  1  . levalbuterol (XOPENEX HFA) 45 MCG/ACT inhaler Inhale 1-2 puffs into the lungs every 4 (four) hours as needed for wheezing or shortness of breath.       . magnesium hydroxide (MILK OF MAGNESIA) 400 MG/5ML suspension Take 15 mLs by mouth daily as needed for constipation.      . metFORMIN (GLUCOPHAGE) 500 MG tablet Take 1 tablet (500 mg total) by mouth 3 (three) times daily.  90 tablet  11  . mirtazapine (REMERON) 15 MG tablet Take 15 mg by mouth at bedtime.      . polyethylene glycol powder (GLYCOLAX/MIRALAX) powder Take 17 g by mouth daily as needed (constipation).       . Protein POWD Take 2 scoop by mouth 3 (three) times daily.      [name] tiotropium (SPIRIVA HANDIHALER) 18 MCG inhalation capsule Place 1 capsule  (18 mcg total) into inhaler and inhale daily.  30 capsule  5  . vitamin B-12 (CYANOCOBALAMIN) 1000 MCG tablet Take 1,000 mcg by mouth  every morning.        No current facility-administered medications on file prior to visit.     Review of Systems Review of Systems  Constitutional: Negative for fever, appetite change, fatigue and unexpected weight change.  Eyes: Negative for pain and visual disturbance.  Respiratory: Negative for cough and shortness of breath.   Cardiovascular: Negative for cp or palpitations    Gastrointestinal: Negative for nausea, diarrhea and constipation.  Genitourinary: Negative for urgency and frequency.  Skin: Negative for pallor or rash   MSK pos for R hip and groin pain/ neg for joint swelling  Neurological: Negative for weakness, light-headedness, numbness and headaches.  Hematological: Negative for adenopathy. Does not bruise/bleed easily.  Psychiatric/Behavioral: Negative for dysphoric mood. The patient is at times agitated/ anxious , pos for dementia and poor short term memory         Objective:   Physical Exam  Constitutional: She appears well-developed and well-nourished. No distress.  Frail app elderly female with dementia in no distress   HENT:  Head: Normocephalic and atraumatic.  Eyes: Conjunctivae and EOM are normal. Pupils are equal, round, and reactive to light. Right eye exhibits no discharge. Left eye exhibits no discharge.  Neck: Normal range of motion. Neck supple.  Cardiovascular: Normal rate and regular rhythm.   Pulmonary/Chest: Effort normal and breath sounds normal.  Diffusely distant bs   Abdominal: Soft. Bowel sounds are normal. She exhibits no mass.  No suprapubic tenderness or fullness    Musculoskeletal:  No pelvic tenderness Slight trochanteric tenderness on R Pt is resistant to full int rot of hip Nl hip flexion and nl ext rotation Pt walks with R foot ext rotated (per family this is baseline for her) (leg is not shortened  however) Gait is slow with walker and she does favor L leg   Lymphadenopathy:    She has no cervical adenopathy.  Neurological: She is alert.  Skin: Skin is warm and dry. No rash noted. No erythema.  Psychiatric:  Baseline dementia with confusion  Pleasant and content today          Assessment & Plan:

## 2013-06-30 NOTE — Patient Instructions (Signed)
Xray of hip today Warm compresses are ok  Tylenol as needed We will call you today with result

## 2013-07-01 NOTE — Assessment & Plan Note (Signed)
R hip pain after fall  Can still bear wt  Pain started 1-2 d after fall  Xray now and update  Tylenol prn - she is resistant to taking  Warm compresses prn

## 2013-07-01 NOTE — Assessment & Plan Note (Signed)
Pt uses walker-still baseline poor balance  Disc fall prev and need for closer supervision-esp at night  Hip pain- xray today

## 2013-07-12 ENCOUNTER — Telehealth: Payer: Self-pay

## 2013-07-12 NOTE — Telephone Encounter (Signed)
Left voicemail on [name]'s phone giving the verbal order for PT and OT due to recent fall

## 2013-07-12 NOTE — Telephone Encounter (Signed)
Victorino DikeJennifer nurse with [facility] left v/m requesting verbal orders to open pt to PT and OT home health due to recent falls.

## 2013-07-12 NOTE — Telephone Encounter (Signed)
Please verbally ok that -thanks  

## 2013-07-15 ENCOUNTER — Telehealth: Payer: Self-pay

## 2013-07-15 NOTE — Telephone Encounter (Signed)
Please give the verbal orders, thanks

## 2013-07-15 NOTE — Telephone Encounter (Signed)
[facility] requesting verbal orders for OT 1 x a week for 1 week and 2 x a week for 3 weeks for safety, strength, coordination and balance with walker. Please advise.

## 2013-07-15 NOTE — Telephone Encounter (Signed)
Verbal order given  

## 2013-07-19 ENCOUNTER — Telehealth: Payer: Self-pay

## 2013-07-19 NOTE — Telephone Encounter (Signed)
Please give the verbal order, thanks

## 2013-07-19 NOTE — Telephone Encounter (Signed)
Left voicemail giving Elisa the Verbal orders for physical therapy 1 x a week for 1 week and 2 x a week for 4 weeks for gait training,safety awareness, balance and care giver training

## 2013-07-19 NOTE — Telephone Encounter (Signed)
[facility]m requesting verbal order for physical therapy 1 x a week for 1 week and 2 x a week for 4 weeks for gait training,safety awareness, balance and care giver training.Please advise.

## 2013-07-22 ENCOUNTER — Telehealth: Payer: Self-pay | Admitting: Internal Medicine

## 2013-07-22 NOTE — Telephone Encounter (Signed)
called patient to make appointment x3. Sent letter 07/21/13 °

## 2013-07-26 DIAGNOSIS — F329 Major depressive disorder, single episode, unspecified: Secondary | ICD-10-CM

## 2013-07-26 DIAGNOSIS — Z5189 Encounter for other specified aftercare: Secondary | ICD-10-CM

## 2013-07-26 DIAGNOSIS — F03918 Unspecified dementia, unspecified severity, with other behavioral disturbance: Secondary | ICD-10-CM

## 2013-07-26 DIAGNOSIS — R279 Unspecified lack of coordination: Secondary | ICD-10-CM

## 2013-07-26 DIAGNOSIS — F0391 Unspecified dementia with behavioral disturbance: Secondary | ICD-10-CM

## 2013-07-26 DIAGNOSIS — F3289 Other specified depressive episodes: Secondary | ICD-10-CM

## 2013-07-28 ENCOUNTER — Telehealth: Payer: Self-pay | Admitting: *Deleted

## 2013-07-28 NOTE — Telephone Encounter (Signed)
Spoke to [name] [facility] who states that he does not see a Dx for neuropathy. He has looked at her med list and does not show anything but pt has s/s in hands and toes. He is questioning if pt needs to be prescribed something for neuropathy as pt has been complaining;pt does have DM. He can be contacted at the telephone # listed if needed.

## 2013-07-28 NOTE — Telephone Encounter (Signed)
If she is developing DM neuropathy - the problem lies in treatment - medicines that treat this tend to increase dizziness and falls and I would like to avoid them She also has a hx of B12 def and this can cause similar symptoms Please make sure she is getting her oral B12 daily- if so -they can double the dose and see if this helps In any case-keep me updated please-thanks

## 2013-07-29 NOTE — Telephone Encounter (Signed)
[facility] know Dr. Royden Purlower's comments: "If she is developing DM neuropathy - the problem lies in treatment - medicines that treat this tend to increase dizziness and falls and I would like to avoid them, She also has a hx of B12 def and this can cause similar symptoms. Please make sure she is getting her oral B12 daily- if so -they can double the dose and see if this helps. In any case-keep me updated please-thanks"

## 2013-08-26 ENCOUNTER — Emergency Department (HOSPITAL_COMMUNITY): Payer: Medicare Other

## 2013-08-26 ENCOUNTER — Encounter (HOSPITAL_COMMUNITY): Payer: Self-pay | Admitting: Emergency Medicine

## 2013-08-26 ENCOUNTER — Observation Stay (HOSPITAL_COMMUNITY)
Admission: EM | Admit: 2013-08-26 | Discharge: 2013-08-27 | Disposition: A | Discharge status: Skilled Nursing Facility | Payer: Medicare Other | Attending: Internal Medicine | Admitting: Internal Medicine

## 2013-08-26 ENCOUNTER — Ambulatory Visit: Payer: Medicare Other | Admitting: Internal Medicine

## 2013-08-26 DIAGNOSIS — J449 Chronic obstructive pulmonary disease, unspecified: Secondary | ICD-10-CM | POA: Insufficient documentation

## 2013-08-26 DIAGNOSIS — J4489 Other specified chronic obstructive pulmonary disease: Secondary | ICD-10-CM | POA: Insufficient documentation

## 2013-08-26 DIAGNOSIS — Z885 Allergy status to narcotic agent status: Secondary | ICD-10-CM | POA: Insufficient documentation

## 2013-08-26 DIAGNOSIS — E860 Dehydration: Secondary | ICD-10-CM | POA: Insufficient documentation

## 2013-08-26 DIAGNOSIS — F0390 Unspecified dementia without behavioral disturbance: Secondary | ICD-10-CM | POA: Insufficient documentation

## 2013-08-26 DIAGNOSIS — I5032 Chronic diastolic (congestive) heart failure: Secondary | ICD-10-CM | POA: Insufficient documentation

## 2013-08-26 DIAGNOSIS — E119 Type 2 diabetes mellitus without complications: Secondary | ICD-10-CM | POA: Insufficient documentation

## 2013-08-26 DIAGNOSIS — Z881 Allergy status to other antibiotic agents status: Secondary | ICD-10-CM | POA: Insufficient documentation

## 2013-08-26 DIAGNOSIS — K219 Gastro-esophageal reflux disease without esophagitis: Secondary | ICD-10-CM | POA: Insufficient documentation

## 2013-08-26 DIAGNOSIS — R55 Syncope and collapse: Secondary | ICD-10-CM | POA: Insufficient documentation

## 2013-08-26 DIAGNOSIS — I4891 Unspecified atrial fibrillation: Secondary | ICD-10-CM | POA: Insufficient documentation

## 2013-08-26 DIAGNOSIS — R627 Adult failure to thrive: Secondary | ICD-10-CM

## 2013-08-26 DIAGNOSIS — E785 Hyperlipidemia, unspecified: Secondary | ICD-10-CM | POA: Insufficient documentation

## 2013-08-26 DIAGNOSIS — I498 Other specified cardiac arrhythmias: Principal | ICD-10-CM | POA: Insufficient documentation

## 2013-08-26 DIAGNOSIS — I509 Heart failure, unspecified: Secondary | ICD-10-CM | POA: Insufficient documentation

## 2013-08-26 DIAGNOSIS — Z7982 Long term (current) use of aspirin: Secondary | ICD-10-CM | POA: Insufficient documentation

## 2013-08-26 DIAGNOSIS — M810 Age-related osteoporosis without current pathological fracture: Secondary | ICD-10-CM | POA: Insufficient documentation

## 2013-08-26 DIAGNOSIS — I10 Essential (primary) hypertension: Secondary | ICD-10-CM | POA: Insufficient documentation

## 2013-08-26 DIAGNOSIS — I480 Paroxysmal atrial fibrillation: Secondary | ICD-10-CM

## 2013-08-26 DIAGNOSIS — R001 Bradycardia, unspecified: Secondary | ICD-10-CM

## 2013-08-26 DIAGNOSIS — Z888 Allergy status to other drugs, medicaments and biological substances status: Secondary | ICD-10-CM | POA: Insufficient documentation

## 2013-08-26 DIAGNOSIS — I6529 Occlusion and stenosis of unspecified carotid artery: Secondary | ICD-10-CM | POA: Insufficient documentation

## 2013-08-26 DIAGNOSIS — Z87891 Personal history of nicotine dependence: Secondary | ICD-10-CM | POA: Insufficient documentation

## 2013-08-26 DIAGNOSIS — G4733 Obstructive sleep apnea (adult) (pediatric): Secondary | ICD-10-CM | POA: Diagnosis present

## 2013-08-26 DIAGNOSIS — I2699 Other pulmonary embolism without acute cor pulmonale: Secondary | ICD-10-CM

## 2013-08-26 HISTORY — DX: Other pulmonary embolism without acute cor pulmonale: I26.99

## 2013-08-26 LAB — URINALYSIS, ROUTINE W REFLEX MICROSCOPIC
Glucose, UA: NEGATIVE mg/dL
Hgb urine dipstick: NEGATIVE
Ketones, ur: 15 mg/dL — AB
LEUKOCYTES UA: NEGATIVE
Nitrite: NEGATIVE
PH: 6.5 (ref 5.0–8.0)
PROTEIN: 30 mg/dL — AB
Specific Gravity, Urine: 1.023 (ref 1.005–1.030)
Urobilinogen, UA: 1 mg/dL (ref 0.0–1.0)

## 2013-08-26 LAB — BASIC METABOLIC PANEL
BUN: 26 mg/dL — AB (ref 6–23)
CALCIUM: 9.5 mg/dL (ref 8.4–10.5)
CO2: 27 mEq/L (ref 19–32)
CREATININE: 0.9 mg/dL (ref 0.50–1.10)
Chloride: 105 mEq/L (ref 96–112)
GFR, EST AFRICAN AMERICAN: 68 mL/min — AB (ref >90)
GFR, EST NON AFRICAN AMERICAN: 58 mL/min — AB (ref >90)
Glucose, Bld: 185 mg/dL — ABNORMAL HIGH (ref 70–99)
Potassium: 4.4 mEq/L (ref 3.7–5.3)
Sodium: 144 mEq/L (ref 137–147)

## 2013-08-26 LAB — CBC
HEMATOCRIT: 36.2 % (ref 36.0–46.0)
Hemoglobin: 12.2 g/dL (ref 12.0–15.0)
MCH: 30.5 pg (ref 26.0–34.0)
MCHC: 33.7 g/dL (ref 30.0–36.0)
MCV: 90.5 fL (ref 78.0–100.0)
PLATELETS: 189 10*3/uL (ref 150–400)
RBC: 4 MIL/uL (ref 3.87–5.11)
RDW: 12.8 % (ref 11.5–15.5)
WBC: 10 10*3/uL (ref 4.0–10.5)

## 2013-08-26 LAB — GLUCOSE, CAPILLARY
Glucose-Capillary: 116 mg/dL — ABNORMAL HIGH (ref 70–99)
Glucose-Capillary: 123 mg/dL — ABNORMAL HIGH (ref 70–99)

## 2013-08-26 LAB — TROPONIN I

## 2013-08-26 LAB — URINE MICROSCOPIC-ADD ON

## 2013-08-26 LAB — PRO B NATRIURETIC PEPTIDE: Pro B Natriuretic peptide (BNP): 431.8 pg/mL (ref 0–450)

## 2013-08-26 LAB — MRSA PCR SCREENING: MRSA BY PCR: NEGATIVE

## 2013-08-26 MED ORDER — DONEPEZIL HCL 10 MG PO TABS
10.0000 mg | ORAL_TABLET | Freq: Every day | ORAL | Status: DC
  Administered 2013-08-26: 10 mg via ORAL
  Filled 2013-08-26 (×2): qty 1

## 2013-08-26 MED ORDER — ASPIRIN 81 MG PO CHEW
81.0000 mg | CHEWABLE_TABLET | Freq: Every day | ORAL | Status: DC
  Administered 2013-08-26 – 2013-08-27 (×2): 81 mg via ORAL
  Filled 2013-08-26 (×2): qty 1

## 2013-08-26 MED ORDER — DOCUSATE SODIUM 100 MG PO CAPS
100.0000 mg | ORAL_CAPSULE | Freq: Two times a day (BID) | ORAL | Status: DC | PRN
  Filled 2013-08-26: qty 1

## 2013-08-26 MED ORDER — ACETAMINOPHEN 325 MG PO TABS: 650.0000 mg | ORAL_TABLET | Freq: Four times a day (QID) | ORAL | Status: DC | PRN

## 2013-08-26 MED ORDER — POLYETHYLENE GLYCOL 3350 17 G PO PACK
17.0000 g | PACK | Freq: Every day | ORAL | Status: DC | PRN
  Filled 2013-08-26: qty 1

## 2013-08-26 MED ORDER — PANTOPRAZOLE SODIUM 20 MG PO TBEC
20.0000 mg | DELAYED_RELEASE_TABLET | Freq: Every day | ORAL | Status: DC
  Administered 2013-08-27: 20 mg via ORAL
  Filled 2013-08-26: qty 1

## 2013-08-26 MED ORDER — POLYETHYLENE GLYCOL 3350 17 GM/SCOOP PO POWD
17.0000 g | Freq: Every day | ORAL | Status: DC | PRN
Start: 2013-08-26 — End: 2013-08-26
  Filled 2013-08-26: qty 255

## 2013-08-26 MED ORDER — SODIUM CHLORIDE 0.9 % IJ SOLN
3.0000 mL | Freq: Two times a day (BID) | INTRAMUSCULAR | Status: DC
  Administered 2013-08-26 – 2013-08-27 (×3): 3 mL via INTRAVENOUS

## 2013-08-26 MED ORDER — TIOTROPIUM BROMIDE MONOHYDRATE 18 MCG IN CAPS
18.0000 ug | ORAL_CAPSULE | Freq: Every day | RESPIRATORY_TRACT | Status: DC
  Administered 2013-08-27: 18 ug via RESPIRATORY_TRACT
  Filled 2013-08-26: qty 5

## 2013-08-26 MED ORDER — INSULIN ASPART 100 UNIT/ML ~~LOC~~ SOLN
0.0000 [IU] | Freq: Three times a day (TID) | SUBCUTANEOUS | Status: DC
  Administered 2013-08-27: 1 [IU] via SUBCUTANEOUS

## 2013-08-26 MED ORDER — FLUTICASONE PROPIONATE HFA 44 MCG/ACT IN AERO
2.0000 | INHALATION_SPRAY | Freq: Two times a day (BID) | RESPIRATORY_TRACT | Status: DC
  Administered 2013-08-27: 2 via RESPIRATORY_TRACT
  Filled 2013-08-26 (×2): qty 10.6

## 2013-08-26 MED ORDER — MIRTAZAPINE 15 MG PO TABS
15.0000 mg | ORAL_TABLET | Freq: Every day | ORAL | Status: DC
  Administered 2013-08-26: 15 mg via ORAL
  Filled 2013-08-26 (×2): qty 1

## 2013-08-26 MED ORDER — ALBUTEROL SULFATE (2.5 MG/3ML) 0.083% IN NEBU: 2.5000 mg | INHALATION_SOLUTION | RESPIRATORY_TRACT | Status: DC | PRN

## 2013-08-26 MED ORDER — ACETAMINOPHEN 650 MG RE SUPP: 650.0000 mg | Freq: Four times a day (QID) | RECTAL | Status: DC | PRN

## 2013-08-26 MED ORDER — LEVALBUTEROL TARTRATE 45 MCG/ACT IN AERO: 1.0000 | INHALATION_SPRAY | RESPIRATORY_TRACT | Status: DC | PRN

## 2013-08-26 MED ORDER — HYDRALAZINE HCL 25 MG PO TABS
25.0000 mg | ORAL_TABLET | Freq: Three times a day (TID) | ORAL | Status: DC
  Administered 2013-08-26 – 2013-08-27 (×4): 25 mg via ORAL
  Filled 2013-08-26 (×7): qty 1

## 2013-08-26 MED ORDER — ATORVASTATIN CALCIUM 10 MG PO TABS
10.0000 mg | ORAL_TABLET | Freq: Every day | ORAL | Status: DC
  Administered 2013-08-26 – 2013-08-27 (×2): 10 mg via ORAL
  Filled 2013-08-26 (×2): qty 1

## 2013-08-26 MED ORDER — METFORMIN HCL 500 MG PO TABS
500.0000 mg | ORAL_TABLET | Freq: Three times a day (TID) | ORAL | Status: DC
  Administered 2013-08-26 – 2013-08-27 (×3): 500 mg via ORAL
  Filled 2013-08-26 (×5): qty 1

## 2013-08-26 NOTE — H&P (Signed)
[name] is an 80 y.o. female.   Chief Complaint:  Syncope HPI:    80 y/o female, who has dementia and lives at [facility], with h/o of multiple syncopal episodes, PAF, previously on coumadin however this was d/c'd in 2006 2/2 colonic AVM's. Her history also includes orthostatic hypotension, COPD, Chronic diastolic HF, PE with filter placement, HTN, HLD, tobacco abuse, PAD with carotid artery stenosis, DMII.  She takes digoxin every other day, Cardizem 213m.    Her last echo was 10/13 and EF was 55-60% with normal WM, peak PA pressure 321mg.  EKG from November 2014 was afib with a rate of 72bpm.     The patient present today after having a syncopal episode.  While on telemetry she is having brief episodes of PACs and sinus bradycardia into the 30's.  She quickly recovers to ~60.     Medications:  Prior to Admission medications   Medication Sig Start Date End Date Taking? Authorizing Provider  acetaminophen (TYLENOL) 325 MG tablet Take 650 mg by mouth every 4 (four) hours as needed for mild pain or headache.   Yes Historical Provider, MD  aspirin 81 MG chewable tablet Chew 81 mg by mouth daily.   Yes Historical Provider, MD  atorvastatin (LIPITOR) 10 MG tablet Take 1 tablet (10 mg total) by mouth daily. 06/01/13  Yes MaFarleyMD  budesonide (PULMICORT FLEXHALER) 180 MCG/ACT inhaler Inhale 1 puff into the lungs 2 (two) times daily. 11/10/12  Yes MuBrand MalesMD  Cholecalciferol (VITAMIN D-3) 1000 UNITS CAPS Take 1,000 Units by mouth daily.    Yes Historical Provider, MD  digoxin (LANOXIN) 0.125 MG tablet Take 0.125 mg by mouth every other day. 06/19/12  Yes JaMinus BreedingMD  diltiazem (CARDIZEM CD) 240 MG 24 hr capsule Take 240 mg by mouth every morning.  06/15/12  Yes MaAbner GreenspanMD  docusate sodium (COLACE) 100 MG capsule Take 1 capsule (100 mg total) by mouth 2 (two) times daily as needed for constipation. 11/26/12  Yes JaRia BushMD  donepezil  (ARICEPT) 10 MG tablet Take 10 mg by mouth daily.  09/08/12  Yes MaAbner GreenspanMD  fluticasone (FLONASE) 50 MCG/ACT nasal spray Place 2 sprays into the nose daily as needed for allergies.   Yes Historical Provider, MD  lansoprazole (PREVACID) 15 MG capsule Take 1 capsule (15 mg total) by mouth daily. 11/20/12  Yes CaGatha MayerMD  levalbuterol ([facility]) 45 MCG/ACT inhaler Inhale 1-2 puffs into the lungs every 4 (four) hours as needed for wheezing or shortness of breath.    Yes Historical Provider, MD  magnesium hydroxide (MILK OF MAGNESIA) 400 MG/5ML suspension Take 15 mLs by mouth daily as needed for constipation.   Yes Historical Provider, MD  metFORMIN (GLUCOPHAGE) 500 MG tablet Take 1 tablet (500 mg total) by mouth 3 (three) times daily. 10/21/12  Yes MaAbner GreenspanMD  mirtazapine (REMERON) 15 MG tablet Take 15 mg by mouth at bedtime.   Yes Historical Provider, MD  polyethylene glycol powder (GLYCOLAX/MIRALAX) powder Take 17 g by mouth daily as needed (constipation).  11/26/12  Yes JaRia BushMD  Protein POWD Take 2 scoop by mouth 3 (three) times daily.   Yes Historical Provider, MD  tiotropium (SPIRIVA HANDIHALER) 18 MCG inhalation capsule Place 1 capsule (18 mcg total) into inhaler and inhale daily. 11/30/12  Yes MuBrand MalesMD  vitamin B-12 (CYANOCOBALAMIN) 1000 MCG tablet Take 2,000 mcg by  mouth every morning.    Yes Historical Provider, MD     Past Medical History  Diagnosis Date  . Allergy     allergic Rhintis  . Hyperlipidemia   . Hypertension   . Osteoporosis   . PAF (paroxysmal atrial fibrillation)     a. PAF dates back > 5 yrs, prev on coumadin->d/c 2006 2/2 GIB;   . Carotid stenosis     a. 09/2007: Carotid Doppler stable- stable bilateral stenosis 40-59% (from 06)  . Type II diabetes mellitus   . Tobacco abuse     a. ongoing - 50+ pack yr hx.  . Dementia     Dementia in Hospital //Does O.K. at home.  . Diverticulosis   . GI bleeding     a. when on  coumadin in 2006 - colonoscopy @ that time showed 2 large right colon avm's, multiple small bowel avm's, fresh blood in prox small bowel.  . AVM (arteriovenous malformation) of colon     a. 2006 colonscopy  . Chronic respiratory failure   . COPD (chronic obstructive pulmonary disease)   . Chronic diastolic CHF (congestive heart failure)   . Normocytic anemia 03/2012  . Complication of anesthesia   . PONV (postoperative nausea and vomiting)   . GERD (gastroesophageal reflux disease)   . Pulmonary embolism     SP IVC filter    Past Surgical History  Procedure Laterality Date  . Cardiac catheterization  1990's    clear  . Orif tibia & fibula fractures  05/2004  . Small bowel enteroscopy  03/2005    AVMS  . Colonoscopy  08/2004  . Esophagogastroduodenoscopy  08/2004    Multiple     Family History  Problem Relation Age of Onset  . Stroke Mother     a. believes she died suddenly @ 52.  . Diabetes Mother   . Stroke Father     Died from cerebral hemorrhage @ 65  . Diabetes Father   . Cancer Sister     colon and stomach cancer  . Diabetes Brother   . Heart disease Brother   . Cancer Brother     died from lung cancer  . Heart disease Brother     MI  . Cancer Sister     ? liver//brain cancer  . Heart disease Sister   . Diabetes Sister     diabetes and glaucoma  . Cancer Brother     pancreatic cancer  . Diabetes Son    Social History:  reports that she quit smoking about 16 months ago. Her smoking use included Cigarettes. She has a 25 pack-year smoking history. She has never used smokeless tobacco. She reports that she does not drink alcohol or use illicit drugs.  Allergies:  Allergies  Allergen Reactions  . Azithromycin Other (See Comments)    inc blood sugar  . Codeine Itching  . Coumadin [Warfarin] Other (See Comments)    Causes bleeding  . Oxybutynin Chloride Er Other (See Comments)    Constipation/ dizziness/ dry mouth     (Not in a hospital  admission)  Results for orders placed during the hospital encounter of 08/26/13 (from the past 48 hour(s))  CBC     Status: None   Collection Time    08/26/13 10:16 AM      Result Value Ref Range   WBC 10.0  4.0 - 10.5 K/uL   RBC 4.00  3.87 - 5.11 MIL/uL   Hemoglobin 12.2  12.0 - 15.0  g/dL   HCT 36.2  36.0 - 46.0 %   MCV 90.5  78.0 - 100.0 fL   MCH 30.5  26.0 - 34.0 pg   MCHC 33.7  30.0 - 36.0 g/dL   RDW 12.8  11.5 - 15.5 %   Platelets 189  150 - 400 K/uL  BASIC METABOLIC PANEL     Status: Abnormal   Collection Time    08/26/13 10:16 AM      Result Value Ref Range   Sodium 144  137 - 147 mEq/L   Potassium 4.4  3.7 - 5.3 mEq/L   Chloride 105  96 - 112 mEq/L   CO2 27  19 - 32 mEq/L   Glucose, Bld 185 (*) 70 - 99 mg/dL   BUN 26 (*) 6 - 23 mg/dL   Creatinine, Ser 0.90  0.50 - 1.10 mg/dL   Calcium 9.5  8.4 - 10.5 mg/dL   GFR calc non Af Amer 58 (*) >90 mL/min   GFR calc Af Amer 68 (*) >90 mL/min   Comment: (NOTE)     The eGFR has been calculated using the CKD EPI equation.     This calculation has not been validated in all clinical situations.     eGFR's persistently <90 mL/min signify possible Chronic Kidney     Disease.  TROPONIN I     Status: None   Collection Time    08/26/13 10:16 AM      Result Value Ref Range   Troponin I <0.30  <0.30 ng/mL   Comment:            Due to the release kinetics of cTnI,     a negative result within the first hours     of the onset of symptoms does not rule out     myocardial infarction with certainty.     If myocardial infarction is still suspected,     repeat the test at appropriate intervals.  PRO B NATRIURETIC PEPTIDE     Status: None   Collection Time    08/26/13 10:16 AM      Result Value Ref Range   Pro B Natriuretic peptide (BNP) 431.8  0 - 450 pg/mL  URINALYSIS, ROUTINE W REFLEX MICROSCOPIC     Status: Abnormal   Collection Time    08/26/13 10:37 AM      Result Value Ref Range   Color, Urine AMBER (*) YELLOW   Comment:  BIOCHEMICALS MAY BE AFFECTED BY COLOR   APPearance CLEAR  CLEAR   Specific Gravity, Urine 1.023  1.005 - 1.030   pH 6.5  5.0 - 8.0   Glucose, UA NEGATIVE  NEGATIVE mg/dL   Hgb urine dipstick NEGATIVE  NEGATIVE   Bilirubin Urine SMALL (*) NEGATIVE   Ketones, ur 15 (*) NEGATIVE mg/dL   Protein, ur 30 (*) NEGATIVE mg/dL   Urobilinogen, UA 1.0  0.0 - 1.0 mg/dL   Nitrite NEGATIVE  NEGATIVE   Leukocytes, UA NEGATIVE  NEGATIVE  URINE MICROSCOPIC-ADD ON     Status: Abnormal   Collection Time    08/26/13 10:37 AM      Result Value Ref Range   Squamous Epithelial / LPF FEW (*) RARE   WBC, UA 0-2  <3 WBC/hpf   Bacteria, UA FEW (*) RARE   Casts HYALINE CASTS (*) NEGATIVE   Dg Chest Portable 1 View  08/26/2013   CLINICAL DATA:  Bradycardia.  Shortness of breath.  EXAM: PORTABLE CHEST - 1 VIEW  COMPARISON:  DG CHEST 2 VIEW dated 05/12/2013; DG CHEST 1V PORT dated 04/21/2012  FINDINGS: Cardiomegaly with pulmonary vascular prominence and interstitial prominence with Kerley B-lines noted. Small left pleural effusion. These findings are consistent with congestive heart failure. No pneumothorax. No acute osseous abnormality.  IMPRESSION: Congestive heart failure with pulmonary interstitial edema and tiny left pleural effusion.   Electronically Signed   By: Marcello Moores  Register   On: 08/26/2013 10:36    Review of Systems  Unable to perform ROS: dementia  Constitutional: Negative for fever.  Cardiovascular: Negative for chest pain.  Gastrointestinal: Negative for abdominal pain.  Neurological: Positive for dizziness. Negative for weakness.    Blood pressure 163/49, pulse 56, temperature 98.1 F (36.7 C), temperature source Oral, resp. rate 20, height _0  (1.676 m), weight 146 lb (66.225 kg), SpO2 100.00%. Physical Exam  Constitutional: She appears well-developed and well-nourished. No distress.  HENT:  Head: Normocephalic and atraumatic.  Eyes: EOM are normal. Pupils are equal, round, and reactive to  light. No scleral icterus.  Neck: Normal range of motion. Neck supple. No JVD present.  Cardiovascular: Regular rhythm, S1 normal and S2 normal.  Bradycardia present.   No murmur heard. Pulses:      Radial pulses are 1+ on the right side, and 1+ on the left side.       Dorsalis pedis pulses are 1+ on the right side, and 1+ on the left side.  Left carotid Bruit  Respiratory: Effort normal and breath sounds normal. She has no wheezes. She has no rales.  GI: Soft. Bowel sounds are normal. She exhibits no distension. There is no tenderness.  Musculoskeletal: She exhibits no edema.  No LEE   Lymphadenopathy:    She has no cervical adenopathy.  Neurological: She is alert. She exhibits normal muscle tone.  Skin: Skin is warm and dry.  Psychiatric: She has a normal mood and affect.     Assessment/Plan Principal Problem:   Syncope Active Problems:   HYPERTENSION   Paroxysmal atrial fibrillation   Bradycardia   Former smoker   DIABETES MELLITUS, TYPE II   Dementia   COPD (chronic obstructive pulmonary disease)   OSA (obstructive sleep apnea)  Plan:  The patient will be admitted overnight for observation.  Monitor on telemetry for arrythmia which may have caused her syncope.  We will discontinue her digoxin and cardizem.   Add hydralazine for HTN.  Check orthostatic BPs in the AM.   Follow CBG and continue metformin.  Consider long term monitoring with loop recorder.  HAGER, BRYAN 08/26/2013, 12:53 PM   I have seen, examined the patient, and reviewed the above assessment and plan.  Changes to above are made where necessary.  The patient presents with recurrent syncope of unclear etiology.  Possibly due to bradycardia, though I cannot be certain.  She has a h/o orthostasis also. I have reviewed her records and do not see any prior episodes of tachycardia.  I therefore think that the best approach is to stop digoxin and diltiazem.  Observation overnight is planned. We will need to check  and follow orthostatic vitals. CHADS2VASC score is at least 5 and she also has a h/o PTE.  She is felt to not be a candidate for anticoagulation due to GI bleeding.  Co Sign: Thompson Grayer, MD 08/26/2013 1:43 PM

## 2013-08-26 NOTE — ED Notes (Signed)
Report given to rn on floor. Pt awaiting transport upstairs.

## 2013-08-26 NOTE — ED Notes (Addendum)
Pt put on bedpan.  100 ml urine noted.

## 2013-08-26 NOTE — ED Provider Notes (Signed)
CSN: [id number]     Arrival date & time 08/26/13  1006 History   First MD Initiated Contact with Patient 08/26/13 1015     Chief Complaint  Patient presents with  . Bradycardia     (Consider location/radiation/quality/duration/timing/severity/associated sxs/prior Treatment) Patient is a 80 y.o. female presenting with syncope. The history is provided by the EMS personnel and the patient.  Loss of Consciousness Episode history:  Single Most recent episode:  Today Timing:  Constant Progression:  Resolved Chronicity:  New Context comment:  Eating Relieved by:  Nothing Ineffective treatments:  None tried Associated symptoms: no chest pain and no fever     Past Medical History  Diagnosis Date  . Allergy     allergic Rhintis  . Hyperlipidemia   . Hypertension   . Osteoporosis   . PAF (paroxysmal atrial fibrillation)     a. PAF dates back > 5 yrs, prev on coumadin->d/c 2006 2/2 GIB;   . Carotid stenosis     a. 09/2007: Carotid Doppler stable- stable bilateral stenosis 40-59% (from 06)  . Type II diabetes mellitus   . Tobacco abuse     a. ongoing - 50+ pack yr hx.  . Dementia     Dementia in Hospital //Does O.K. at home.  . Diverticulosis   . GI bleeding     a. when on coumadin in 2006 - colonoscopy @ that time showed 2 large right colon avm's, multiple small bowel avm's, fresh blood in prox small bowel.  . AVM (arteriovenous malformation) of colon     a. 2006 colonscopy  . Chronic respiratory failure   . COPD (chronic obstructive pulmonary disease)   . Chronic diastolic CHF (congestive heart failure)   . Normocytic anemia 03/2012  . Complication of anesthesia   . PONV (postoperative nausea and vomiting)   . GERD (gastroesophageal reflux disease)   . Pulmonary embolism     SP IVC filter   Past Surgical History  Procedure Laterality Date  . Cardiac catheterization  1990's    clear  . Orif tibia & fibula fractures  05/2004  . Small bowel enteroscopy  03/2005    AVMS   . Colonoscopy  08/2004  . Esophagogastroduodenoscopy  08/2004    Multiple    Family History  Problem Relation Age of Onset  . Stroke Mother     a. believes she died suddenly @ 57.  . Diabetes Mother   . Stroke Father     Died from cerebral hemorrhage @ 30  . Diabetes Father   . Cancer Sister     colon and stomach cancer  . Diabetes Brother   . Heart disease Brother   . Cancer Brother     died from lung cancer  . Heart disease Brother     MI  . Cancer Sister     ? liver//brain cancer  . Heart disease Sister   . Diabetes Sister     diabetes and glaucoma  . Cancer Brother     pancreatic cancer  . Diabetes Son    History  Substance Use Topics  . Smoking status: Former Smoker -- 0.50 packs/day for 50 years    Types: Cigarettes    Quit date: 04/21/2012  . Smokeless tobacco: Never Used  . Alcohol Use: No   OB History   Grav Para Term Preterm Abortions TAB SAB Ect Mult Living                 Review of Systems  Constitutional: Negative for fever.  Respiratory: Negative for cough.   Cardiovascular: Positive for syncope. Negative for chest pain.  All other systems reviewed and are negative.      Allergies  Azithromycin; Codeine; Coumadin; and Oxybutynin chloride er  Home Medications   Current Outpatient Rx  Name  Route  Sig  Dispense  Refill  . acetaminophen (TYLENOL) 325 MG tablet   Oral   Take 650 mg by mouth every 4 (four) hours as needed for mild pain or headache.         [name] aspirin 81 MG chewable tablet   Oral   Chew 81 mg by mouth daily.         [name] atorvastatin (LIPITOR) 10 MG tablet   Oral   Take 1 tablet (10 mg total) by mouth daily.   90 tablet   3   . budesonide (PULMICORT FLEXHALER) 180 MCG/ACT inhaler   Inhalation   Inhale 1 puff into the lungs 2 (two) times daily.   1 Inhaler   6   . Cholecalciferol (VITAMIN D-3) 1000 UNITS CAPS   Oral   Take 1,000 Units by mouth daily.          . digoxin (LANOXIN) 0.125 MG tablet   Oral    Take 0.125 mg by mouth every other day.         . diltiazem (CARDIZEM CD) 240 MG 24 hr capsule   Oral   Take 240 mg by mouth every morning.          . docusate sodium (COLACE) 100 MG capsule   Oral   Take 1 capsule (100 mg total) by mouth 2 (two) times daily as needed for constipation.         [name] donepezil (ARICEPT) 10 MG tablet   Oral   Take 10 mg by mouth daily.          . fluticasone (FLONASE) 50 MCG/ACT nasal spray   Nasal   Place 2 sprays into the nose daily as needed for allergies.         [name] lansoprazole (PREVACID) 15 MG capsule   Oral   Take 1 capsule (15 mg total) by mouth daily.   90 capsule   1   . levalbuterol (XOPENEX HFA) 45 MCG/ACT inhaler   Inhalation   Inhale 1-2 puffs into the lungs every 4 (four) hours as needed for wheezing or shortness of breath.          . magnesium hydroxide (MILK OF MAGNESIA) 400 MG/5ML suspension   Oral   Take 15 mLs by mouth daily as needed for constipation.         . metFORMIN (GLUCOPHAGE) 500 MG tablet   Oral   Take 1 tablet (500 mg total) by mouth 3 (three) times daily.   90 tablet   11   . mirtazapine (REMERON) 15 MG tablet   Oral   Take 15 mg by mouth at bedtime.         . polyethylene glycol powder (GLYCOLAX/MIRALAX) powder   Oral   Take 17 g by mouth daily as needed (constipation).          . Protein POWD   Oral   Take 2 scoop by mouth 3 (three) times daily.         [name] tiotropium (SPIRIVA HANDIHALER) 18 MCG inhalation capsule   Inhalation   Place 1 capsule (18 mcg total) into inhaler and inhale daily.   30 capsule  5   . vitamin B-12 (CYANOCOBALAMIN) 1000 MCG tablet   Oral   Take 2,000 mcg by mouth every morning.           BP 163/49  Pulse 56  Temp(Src) 98.1 F (36.7 C) (Oral)  Resp 20  Ht 5\' 6"  (1.676 m)  Wt 146 lb (66.225 kg)  BMI 23.58 kg/m2  SpO2 100% Physical Exam  Nursing note and vitals reviewed. Constitutional: She is oriented to person, place, and time. She appears  well-developed and well-nourished. No distress.  HENT:  Head: Normocephalic and atraumatic.  Eyes: EOM are normal. Pupils are equal, round, and reactive to light.  Neck: Normal range of motion. Neck supple.  Cardiovascular: Regular rhythm.  Bradycardia present.  Exam reveals no friction rub.   No murmur heard. HRs fluctuating between 30s and 60s  Pulmonary/Chest: Effort normal and breath sounds normal. No respiratory distress. She has no wheezes. She has no rales.  Abdominal: Soft. She exhibits no distension. There is no tenderness. There is no rebound.  Musculoskeletal: Normal range of motion. She exhibits no edema.  Neurological: She is alert and oriented to person, place, and time.  Skin: She is not diaphoretic.    ED Course  Procedures (including critical care time) Labs Review Labs Reviewed  BASIC METABOLIC PANEL - Abnormal; Notable for the following:    Glucose, Bld 185 (*)    BUN 26 (*)    GFR calc non Af Amer 58 (*)    GFR calc Af Amer 68 (*)    All other components within normal limits  URINALYSIS, ROUTINE W REFLEX MICROSCOPIC - Abnormal; Notable for the following:    Color, Urine AMBER (*)    Bilirubin Urine SMALL (*)    Ketones, ur 15 (*)    Protein, ur 30 (*)    All other components within normal limits  URINE MICROSCOPIC-ADD ON - Abnormal; Notable for the following:    Squamous Epithelial / LPF FEW (*)    Bacteria, UA FEW (*)    Casts HYALINE CASTS (*)    All other components within normal limits  CBC  TROPONIN I  PRO B NATRIURETIC PEPTIDE   Imaging Review Dg Chest Portable 1 View  08/26/2013   CLINICAL DATA:  Bradycardia.  Shortness of breath.  EXAM: PORTABLE CHEST - 1 VIEW  COMPARISON:  DG CHEST 2 VIEW dated 05/12/2013; DG CHEST 1V PORT dated 04/21/2012  FINDINGS: Cardiomegaly with pulmonary vascular prominence and interstitial prominence with Kerley B-lines noted. Small left pleural effusion. These findings are consistent with congestive heart failure. No  pneumothorax. No acute osseous abnormality.  IMPRESSION: Congestive heart failure with pulmonary interstitial edema and tiny left pleural effusion.   Electronically Signed   By: Maisie Fushomas  Register   On: 08/26/2013 10:36    EKG Interpretation    Date/Time:  Thursday August 26 2013 10:09:04 EST Ventricular Rate:  58 PR Interval:  162 QRS Duration: 92 QT Interval:  425 QTC Calculation: 417 R Axis:   50 Text Interpretation:  Sinus rhythm Multiple premature complexes, vent  Borderline T abnormalities, anterior leads Similar to previous Confirmed by Gwendolyn GrantWALDEN  MD, Deanta Mincey ([id number]) on 08/26/2013 10:15:29 AM            MDM   Final diagnoses:  Syncope  Bradycardia    58F presents with syncope and bradycardia. Known CHF, followed by Dr. Antoine PocheHochrein. At her nursing home had syncopal episode while eating, had HR in the 30s at that time. Here relaxing comfortably,  following commands, denying any pain or SOB. Mild dementia. HRs fluctuating between 30s and 60s. Labs normal. Cards consulted and will admit for observation.   Dagmar Hait, MD 08/26/13 [phone]

## 2013-08-26 NOTE — ED Notes (Signed)
EMS-pt from [facility], had syncopal episode this am.  Per ems pt was hypotensive and bradycardic(40s) when they arrived there they gave a fluid bolus and it came up to 55 before arrival.

## 2013-08-26 NOTE — ED Notes (Signed)
Cardiologist at bedside to see pt

## 2013-08-27 ENCOUNTER — Ambulatory Visit: Payer: Medicare Other | Admitting: Internal Medicine

## 2013-08-27 DIAGNOSIS — R627 Adult failure to thrive: Secondary | ICD-10-CM

## 2013-08-27 DIAGNOSIS — E860 Dehydration: Secondary | ICD-10-CM

## 2013-08-27 LAB — GLUCOSE, CAPILLARY
GLUCOSE-CAPILLARY: 124 mg/dL — AB (ref 70–99)
GLUCOSE-CAPILLARY: 142 mg/dL — AB (ref 70–99)
Glucose-Capillary: 120 mg/dL — ABNORMAL HIGH (ref 70–99)

## 2013-08-27 LAB — TSH: TSH: 1.38 u[IU]/mL (ref 0.350–4.500)

## 2013-08-27 MED ORDER — HYDRALAZINE HCL 25 MG PO TABS: 25.0000 mg | ORAL_TABLET | Freq: Three times a day (TID) | ORAL | Status: DC

## 2013-08-27 NOTE — Discharge Summary (Signed)
Physician Discharge Summary     Patient ID: [name] MRN: [id number] DOB/AGE: [birth year] 80 y.o.  Cardiologist:  Hochrein  Admit date: 08/26/2013 Discharge date: 08/27/2013  Admission Diagnoses:  Syncope, Bradycardia  Discharge Diagnoses:  Principal Problem:   Syncope Active Problems:   HYPERTENSION   Paroxysmal atrial fibrillation   Bradycardia   Former smoker   DIABETES MELLITUS, TYPE II   Dementia   COPD (chronic obstructive pulmonary disease)   OSA (obstructive sleep apnea)   Dehydration  Discharged Condition: stable  Hospital Course:   80 y/o female, who has dementia and lives at [facility], with h/o of multiple syncopal episodes, PAF, previously on coumadin however this was d/c'd in 2006 2/2 colonic AVM's. Her history also includes orthostatic hypotension, COPD, Chronic diastolic HF, PE with filter placement, HTN, HLD, tobacco abuse, PAD with carotid artery stenosis, DMII. She takes digoxin every other day, Cardizem 240mg . Her last echo was 10/13 and EF was 55-60% with normal WM, peak PA pressure . EKG from November 2014 was afib with a rate of 72bpm.   The patient present today after having a syncopal episode. While on telemetry she was having brief episodes of PACs and sinus bradycardia into the 30's. She quickly recovered to ~60.  She was admitted for observation.  Cardizem and digoxin were discontinued.  Hydralazine was added for HTN.  Given her age and propensity for dizziness, it was recommended against aggressive BP control. She was not orthostatic when vitals were checked.  CXR indicated CHF but clinically she was not.  Pro BNP was 431.  She had resolution of severe bradycardia. The patient was seen by Dr. Johney Frame who felt she was stable for DC home.  Follow up with Dr. Antoine Poche.    Consults: None  Significant Diagnostic Studies:  EXAM: PORTABLE CHEST - 1 VIEW  COMPARISON: DG CHEST 2 VIEW dated 05/12/2013; DG CHEST 1V PORT dated  04/21/2012  FINDINGS: Cardiomegaly with pulmonary vascular prominence and interstitial prominence with Kerley B-lines noted. Small left pleural effusion. These findings are consistent with congestive heart failure. No pneumothorax. No acute osseous abnormality.  IMPRESSION: Congestive heart failure with pulmonary interstitial edema and tiny left pleural effusion.  Treatments: Discontinued digoxin and cardizem.  Hydralazine added  Discharge Exam: Blood pressure 166/88, pulse 85, temperature 97.5 F (36.4 C), temperature source Oral, resp. rate 20, height 5\' 6"  (1.676 m), weight 140 lb 10.5 oz (63.8 kg), SpO2 97.00%.   Disposition: 01-Home or Self Care      Discharge Orders   Future Appointments Provider Department Dept Phone   08/27/2013 10:30 AM Kalman Shan, MD [facility] [phone]   11/30/2013 12:00 PM Judy Pimple, MD [facility] [place] [facility] [phone]   Future Orders Complete By Expires   Diet - low sodium heart healthy  As directed    Increase activity slowly  As directed        Medication List    STOP taking these medications       digoxin 0.125 MG tablet  Commonly known as:  LANOXIN     diltiazem 240 MG 24 hr capsule  Commonly known as:  CARDIZEM CD      TAKE these medications       acetaminophen 325 MG tablet  Commonly known as:  TYLENOL  Take 650 mg by mouth every 4 (four) hours as needed for mild pain or headache.     aspirin 81 MG chewable tablet  Chew 81 mg by mouth daily.  atorvastatin 10 MG tablet  Commonly known as:  LIPITOR  Take 1 tablet (10 mg total) by mouth daily.     budesonide 180 MCG/ACT inhaler  Commonly known as:  PULMICORT FLEXHALER  Inhale 1 puff into the lungs 2 (two) times daily.     COLACE 100 MG capsule  Generic drug:  docusate sodium  Take 1 capsule (100 mg total) by mouth 2 (two) times daily as needed for constipation.     donepezil 10 MG tablet  Commonly known as:  ARICEPT  Take 10 mg  by mouth daily.     fluticasone 50 MCG/ACT nasal spray  Commonly known as:  FLONASE  Place 2 sprays into the nose daily as needed for allergies.     hydrALAZINE 25 MG tablet  Commonly known as:  APRESOLINE  Take 1 tablet (25 mg total) by mouth every 8 (eight) hours.     lansoprazole 15 MG capsule  Commonly known as:  PREVACID  Take 1 capsule (15 mg total) by mouth daily.     levalbuterol 45 MCG/ACT inhaler  Commonly known as:  XOPENEX HFA  Inhale 1-2 puffs into the lungs every 4 (four) hours as needed for wheezing or shortness of breath.     magnesium hydroxide 400 MG/5ML suspension  Commonly known as:  MILK OF MAGNESIA  Take 15 mLs by mouth daily as needed for constipation.     metFORMIN 500 MG tablet  Commonly known as:  GLUCOPHAGE  Take 1 tablet (500 mg total) by mouth 3 (three) times daily.     mirtazapine 15 MG tablet  Commonly known as:  REMERON  Take 15 mg by mouth at bedtime.     polyethylene glycol packet  Commonly known as:  MIRALAX / GLYCOLAX  Take 17 g by mouth daily as needed (constipation).     Protein Powd  Take 2 scoop by mouth 3 (three) times daily.     tiotropium 18 MCG inhalation capsule  Commonly known as:  SPIRIVA HANDIHALER  Place 1 capsule (18 mcg total) into inhaler and inhale daily.     vitamin B-12 1000 MCG tablet  Commonly known as:  CYANOCOBALAMIN  Take 2,000 mcg by mouth every morning.      ASK your doctor about these medications       Vitamin D-3 1000 UNITS Caps  Take 1,000 Units by mouth daily.       Follow-up Information   Follow up with Rollene RotundaJames Hochrein, MD. (The office scheduler will call with the appointment date and time. )    Specialty:  Cardiology   Contact information:   [address], [address] [place] [address] [phone]       Signed: Wilburt FinlayHAGER, BRYAN 08/27/2013, 10:25 AM  Hillis RangeJames Lacheryl Niesen MD

## 2013-08-27 NOTE — Progress Notes (Signed)
Patient orthostatic VS negative. Patient unable to stand for 3 minutes for last BP. Pt states she did not feel dizzy or lightheaded with standing. Huel Coventryosenberger, Sahan Pen A, RN

## 2013-08-27 NOTE — Progress Notes (Signed)
SUBJECTIVE: The patient states that she is still having intermittent dizziness, but has not ambulated.  Orthostatic vital signs negative yesterday.  Patient unable to stand for 3 minute measurements.     CURRENT MEDICATIONS: . aspirin  81 mg Oral Daily  . atorvastatin  10 mg Oral Daily  . donepezil  10 mg Oral QHS  . fluticasone  2 puff Inhalation BID  . hydrALAZINE  25 mg Oral 3 times per day  . insulin aspart  0-9 Units Subcutaneous TID WC  . metFORMIN  500 mg Oral TID WC  . mirtazapine  15 mg Oral QHS  . pantoprazole  20 mg Oral Daily  . sodium chloride  3 mL Intravenous Q12H  . tiotropium  18 mcg Inhalation Daily      OBJECTIVE: Physical Exam: Filed Vitals:   08/27/13 0454 08/27/13 0455 08/27/13 0456 08/27/13 0550  BP: 168/84 164/90 166/88   Pulse: 70 71 85   Temp: 97.5 F (36.4 C)     TempSrc: Oral     Resp: 20     Height:      Weight:   137 lb 12.6 oz (62.5 kg) 140 lb 10.5 oz (63.8 kg)  SpO2: 100%       Intake/Output Summary (Last 24 hours) at 08/27/13 0716 Last data filed at 08/27/13 0002  Gross per 24 hour  Intake    243 ml  Output    726 ml  Net   -483 ml    Telemetry reveals sinus rhythm with PAC's, rates 50-70, no significant pauses  GEN- The patient is well appearing, alert but very confused Head- normocephalic, atraumatic Eyes-  Sclera clear, conjunctiva pink Ears- hearing intact Oropharynx- clear Neck- supple  Lungs- Clear to ausculation bilaterally, normal work of breathing Heart- Regular rate and rhythm, no murmurs, rubs or gallops, PMI not laterally displaced GI- soft, NT, ND, + BS Extremities- no clubbing, cyanosis, or edema Skin- no rash or lesion Psych- euthymic mood, full affect Neuro- strength and sensation are intact  LABS: Basic Metabolic Panel:  Recent Labs  14/78/2902/26/15 1016  NA 144  K 4.4  CL 105  CO2 27  GLUCOSE 185*  BUN 26*  CREATININE 0.90  CALCIUM 9.5   CBC:  Recent Labs  08/26/13 1016  WBC 10.0  HGB 12.2    HCT 36.2  MCV 90.5  PLT 189   Cardiac Enzymes:  Recent Labs  08/26/13 1016  TROPONINI <0.30   Thyroid Function Tests:  Recent Labs  08/26/13 1635  TSH 1.380    RADIOLOGY: Dg Chest Portable 1 View 08/26/2013   CLINICAL DATA:  Bradycardia.  Shortness of breath.  EXAM: PORTABLE CHEST - 1 VIEW  COMPARISON:  DG CHEST 2 VIEW dated 05/12/2013; DG CHEST 1V PORT dated 04/21/2012  FINDINGS: Cardiomegaly with pulmonary vascular prominence and interstitial prominence with Kerley B-lines noted. Small left pleural effusion. These findings are consistent with congestive heart failure. No pneumothorax. No acute osseous abnormality.  IMPRESSION: Congestive heart failure with pulmonary interstitial edema and tiny left pleural effusion.   Electronically Signed   By: Maisie Fushomas  Register   On: 08/26/2013 10:36    ASSESSMENT AND PLAN:  Principal Problem:   Syncope Active Problems:   Former smoker   HYPERTENSION   Paroxysmal atrial fibrillation   DIABETES MELLITUS, TYPE II   Dementia   COPD (chronic obstructive pulmonary disease)   OSA (obstructive sleep apnea)   Bradycardia  1. Syncope Unclear etiology, possibly due to bradycardia (see below)  No further inpatient workup planned   2. Sinus bradycardia Resolved off of digoxin and diltiazem  3. Dehydration Not orthostatic but dry on exam Push po fluids  4. htn Stable No change required today Given advanced age and dizziness, I would not recommend more aggressive control  5. Dementia Advanced Return to memory disorder facility today  Return to see Dr Antoine Poche in 4-6 weeks

## 2013-08-27 NOTE — Progress Notes (Signed)
UR completed 

## 2013-09-16 ENCOUNTER — Encounter: Payer: Self-pay | Admitting: Internal Medicine

## 2013-09-16 ENCOUNTER — Ambulatory Visit (INDEPENDENT_AMBULATORY_CARE_PROVIDER_SITE_OTHER): Payer: Medicare Other | Admitting: Internal Medicine

## 2013-09-16 VITALS — BP 120/72 | HR 56 | Ht 66.0 in | Wt 147.0 lb

## 2013-09-16 DIAGNOSIS — Z23 Encounter for immunization: Secondary | ICD-10-CM

## 2013-09-16 DIAGNOSIS — J449 Chronic obstructive pulmonary disease, unspecified: Secondary | ICD-10-CM

## 2013-09-16 NOTE — Progress Notes (Signed)
Subjective:    Patient ID: [name], female    DOB: Oct 03, 1929, 80 y.o.   MRN: [id number]  HPI  PCP is Roxy Manns, MD   #Acute Respiratory Failure #Chronic Respiratory Failure / COPD - June 2010 - intubated with acinetobacter (resistant) lower lobe pneumonia following ankle fracture  - Admited 04/21/12 - 05/05/12 - Rx Bipap. Discharged on nocturnal NIPPV. In hospital PCO2 71  #Tobacco Abuse  - quit Oct 2013 post admission  #. Acute on Chronic Diastolic CHF  - EF 55-60% by echo 04/22/12  #Cachexia - Weight 150-->143lbs   - weight 137# on 01/08/2013 with Body mass index is 22.25 kg/(m^2).    # Atrial Fibrillation w/ RVR  - H/o PAF. Not anticoagulation candidate due to h/o AVM/GIB, dementia, and fall risk  - Conservative therapy with rate control   #Other issues   -  Diabetes Mellitus Type 2, Anemia of chronic disease  #Lives in [facility] - Memory Care Unit - since summer 2014   OV 05/14/12  - Followup followin recent admission above HCAP/AECOPD/Acute on chronic respiratory failure. She is her with son and daughter in law. She is in SNF. She is getting stronger and doing some rehab. She looks better. No resp complaints. She has quit smoking an swears she will never smoke again. Meds reviewed and noticed she is on mdi. SHe looks too frail to do mdi. Son pleased with her progres  06/11/2012 Follow up and Med review  Patient returns for a one-month followup and medication review.  She has brought all of her medications in today for review. She has now been discharged from rehabilitation center back to home. She is living with her son. We reviewed all her medications and organized them into a medication calendar. Unfortunately, she misunderstood instructions were, it was not communicated at discharge to rehabilitation center. She is not taking her nebulizers as recommended from last visit. She is also only taking Pulmicort inhaler once daily. She is slowly getting  stronger and is now able to walk with her walker and do independent. Dressing. She does require assistance with bathing in the shower, mainly with only balance issues. Her dyspnea has decreased in her activity tolerance is slowly improving as long as she wears her oxygen. She continues on CPAP at night and has an upcoming sleep study. Next month. Her family is concerned because they're currently pain, for CPAP out-of-pocket, and this has not been ran to her insurance. She did have hypercarbia during her last admission with a PCO2 of 71. She denies any hemoptysis, orthopnea, PND, or increased leg swelling She was supposed to undergo a pulmonary function test. This morning, however, was unable to complete the test, you to inability to hold her breath  REC Continue oxygen  Continue cpap at night  Increase Pulmicort inhaler 2 puffs twice daily  Increase xopenex nebulizer 4 times daily and Ipratropium nebulizer 4 times daily  Follow med calendar closely and bring to each visit.  follow up Dr. Marchelle Gearing in 4 weeks and As needed  Please contact office for sooner follow up if symptoms do not improve or worsen or seek emergency care      OV 07/13/2012  - her with son and ? D-i-l. In interim PE admit and s/p IVC filter (no coumadin due to fall risk and easy bleed hx). Since then dementia worse. Home PT ongoing for short term. Overall frail. ECOG 3. COPD CAT score is 25 and reflects significant disease burden Needing  lot of assist. She is feeling like a caged animal. Family providing constant care and assitance; facing burnout. Patient herself feels like ccaged animal and feels caregivers not giving her assistance. Yet she cannot live by herself. Resulting in tension. Son privately confiding that dementia worse. They want to talk to me privately. COPD is stable . She uses nocturnal cpap but had sleep study 06/21/12 and wants to know results so it can be paid for. Per Dr Shelle Iron - moderate OSA present but no  time to complete titration protocol   10/22/2012 Follow up  Pt returns for COPD follow up  She recently was admitted after a  fall few weeks ago , at discharge she was changed to Spriva from Ipratropium neb and Xopenex neb decreased Twice daily  . She continues on pulmicort inhaler Twice daily   .  She is accompanied by her family . Says her COPD is at baseline with no increased cough or wheezing.  She wears O2 on/off At bedtime  . Uses CPAP some, gets confused at night (hx of dementia) .  No hemoptysis, edema, n/v. Or chest pain.   REC Continue on Pulmicort inhaler 2 puffs twice daily Continue on Xopenex nebulizer Twice daily   Continue on Spiriva 1 puff daily  Follow up Dr. Marchelle Gearing in 2 months  Please contact office for sooner follow up if symptoms do not improve or worsen or seek emergency care    OV 01/08/2013 FU COPD  COPD is stable per son and dauhter. Patinet now living in memory care unit due to progressive dementia. THey want cpap stopped due to inability to comply despite various efforts. IT is expensive as well. No new issues. She is on mdi spiriva and pulmicort because this is cheaper to give in the memory care unit  #Sleep apnea  - due to inability to comply, and affecting quality of life - stop and discontinue CPAP  #COPD  use spiriva and pulmicort mdi per schedule  use xopenex mdi as neeeded  flu shot in fall  #Followup  6-9 months COPD CAT score at followup   OV 09/16/2013  Chief Complaint  Patient presents with  . COPD    follow-up. Pt states perfume causes her to have increased SOB. Otherwise she states she does not really have much SOB.    Followup COPD. She continues to live in the memory care unit. Her son and daughter-in-law at test that memory is stable. She has short-term intermittent memory loss but intact long-term memory. She gets around using a walker. She still able to do activities of daily living and administer inhalers herself. She is not  eligible for nebulizers because the memory care unit discharging a lot. The son wants prior authorization for Xopenex. They will have Prevnar vaccine today. Overall COPD stable. COPD cat score is 3 but the low score probably reflects either stability or memory loss loss of executive function  CAT COPD Symptom & Quality of Life Score (GSK trademark) 0 is no burden. 5 is highest burden 07/13/2012  09/16/2013   Never Cough -> Cough all the time 4 1  No phlegm in chest -> Chest is full of phlegm 2 0  No chest tightness -> Chest feels very tight 2 1  No dyspnea for 1 flight stairs/hill -> Very dyspneic for 1 flight of stairs 5 0  No limitations for ADL at home -> Very limited with ADL at home 5 0  Confident leaving home -> Not at all confident  leaving home 3 0  Sleep soundly -> Do not sleep soundly because of lung condition 1 0  Lots of Energy -> No energy at all 3 1  TOTAL Score (max 40)  25 3    Review of Systems  Constitutional: Negative for fever and unexpected weight change.  HENT: Negative for congestion, dental problem, ear pain, nosebleeds, postnasal drip, rhinorrhea, sinus pressure, sneezing, sore throat and trouble swallowing.   Eyes: Negative for redness and itching.  Respiratory: Positive for shortness of breath. Negative for cough, chest tightness and wheezing.   Cardiovascular: Negative for palpitations and leg swelling.  Gastrointestinal: Negative for nausea and vomiting.  Genitourinary: Negative for dysuria.  Musculoskeletal: Negative for joint swelling.  Skin: Negative for rash.  Neurological: Negative for headaches.  Hematological: Does not bruise/bleed easily.  Psychiatric/Behavioral: Negative for dysphoric mood. The patient is not nervous/anxious.        Objective:   Physical Exam     Physical Exam GEN: A/Ox3; pleasant , NAD, elderly and chronically ill appearing   HEENT:  [place]/AT,  EACs-clear, TMs-wnl, NOSE-clear, THROAT-clear, no lesions, no postnasal drip or  exudate noted.   NECK:  Supple w/ fair ROM; no JVD; normal carotid impulses w/o bruits; no thyromegaly or nodules palpated; no lymphadenopathy.  RESP  Diminshed BS in bases .no accessory muscle use, no dullness to percussion  CARD:  RRR, no m/r/g  , no peripheral edema, pulses intact, no cyanosis or clubbing.  GI:   Soft & nt; nml bowel sounds; no organomegaly or masses detected.  Musco: Warm bil, no deformities or joint swelling noted.   Neuro: alert, no focal deficits noted.    Skin: Warm, no lesions or rashes         Assessment & Plan:

## 2013-09-16 NOTE — Patient Instructions (Signed)
#  COPD  use spiriva and pulmicort mdi per schedule  use xopenex mdi as neeeded; will do prior auth PREVNAR vaccine 09/16/2013  flu shot in fall  #Followup 9 months COPD CAT score at followup

## 2013-09-17 NOTE — Assessment & Plan Note (Signed)
#  COPD  use spiriva and pulmicort mdi per schedule  use xopenex mdi as neeeded; will do prior auth PREVNAR vaccine 09/16/2013  flu shot in fall  #Followup 9 months COPD CAT score at followup 

## 2013-09-21 ENCOUNTER — Encounter: Payer: Medicare Other | Admitting: Physician Assistant

## 2013-09-23 ENCOUNTER — Ambulatory Visit (INDEPENDENT_AMBULATORY_CARE_PROVIDER_SITE_OTHER): Payer: Medicare Other | Admitting: Physician Assistant

## 2013-09-23 ENCOUNTER — Encounter: Payer: Self-pay | Admitting: Physician Assistant

## 2013-09-23 VITALS — BP 121/79 | HR 46 | Ht 66.0 in | Wt 149.0 lb

## 2013-09-23 DIAGNOSIS — I10 Essential (primary) hypertension: Secondary | ICD-10-CM

## 2013-09-23 DIAGNOSIS — I4891 Unspecified atrial fibrillation: Secondary | ICD-10-CM

## 2013-09-23 DIAGNOSIS — R001 Bradycardia, unspecified: Secondary | ICD-10-CM

## 2013-09-23 DIAGNOSIS — I480 Paroxysmal atrial fibrillation: Secondary | ICD-10-CM

## 2013-09-23 DIAGNOSIS — I5032 Chronic diastolic (congestive) heart failure: Secondary | ICD-10-CM

## 2013-09-23 DIAGNOSIS — I498 Other specified cardiac arrhythmias: Secondary | ICD-10-CM

## 2013-09-23 DIAGNOSIS — R55 Syncope and collapse: Secondary | ICD-10-CM

## 2013-09-23 MED ORDER — HYDRALAZINE HCL 25 MG PO TABS: 12.5000 mg | ORAL_TABLET | Freq: Two times a day (BID) | ORAL | Status: DC

## 2013-09-23 MED ORDER — HYDRALAZINE HCL 25 MG PO TABS: 25.0000 mg | ORAL_TABLET | Freq: Two times a day (BID) | ORAL | Status: DC

## 2013-09-23 NOTE — Progress Notes (Signed)
[address] [place], [place]  [address] Phone: [phone] Fax:  [phone]  Date:  09/23/2013   ID:  [name], DOB 12-10-1929, MRN [id number]  PCP:  Roxy Manns, MD  Cardiologist:  Dr. Rollene Rotunda     History of Present Illness: [name] is a 80 y.o. female with a hx of dementia and lives at [facility], with h/o of multiple syncopal episodes, PAF, previously on coumadin however this was d/c'd in 2006 2/2 colonic AVM's. Her history also includes orthostatic hypotension, COPD, Chronic diastolic HF, PE with filter placement, HTN, HLD, tobacco abuse, PAD with carotid artery stenosis, diabetes.    Patient was admitted with syncope 2/26-2/27. Digoxin and diltiazem were discontinued secondary to sinus bradycardia (HR into 30s at times on tele). Etiology of syncope was not clear but felt possibly due to bradycardia. Patient did report dizziness. Dr. Johney Frame saw the patient and recommended not to treat blood pressure aggressively secondary to advanced age.  Patient is here son and his wife.  She feels like she is doing better. She still has occasional dizziness with standing. She denies any further syncope. She denies chest pain, significant dyspnea, chronic, PND or edema.  Studies:  - Echo (04/22/12):  EF 55-60%, no RWMA, mild MR, moderate LAE, mild RAE, PASP 38, trivial effusion   Recent Labs: 06/01/2013: ALT 10  08/26/2013: Creatinine 0.90; Hemoglobin 12.2; Potassium 4.4; Pro B Natriuretic peptide (BNP) 431.8; TSH 1.380   Wt Readings from Last 3 Encounters:  09/23/13 149 lb (67.586 kg)  09/16/13 147 lb (66.679 kg)  08/27/13 140 lb 10.5 oz (63.8 kg)     Past Medical History  Diagnosis Date  . Allergy     allergic Rhintis  . Hyperlipidemia   . Hypertension   . Osteoporosis   . PAF (paroxysmal atrial fibrillation)     a. PAF dates back > 5 yrs, prev on coumadin->d/c 2006 2/2 GIB;   . Carotid stenosis     a. 09/2007: Carotid Doppler stable- stable  bilateral stenosis 40-59% (from 06)  . Type II diabetes mellitus   . Tobacco abuse     a. ongoing - 50+ pack yr hx.  . Dementia     Dementia in Hospital //Does O.K. at home.  . Diverticulosis   . GI bleeding     a. when on coumadin in 2006 - colonoscopy @ that time showed 2 large right colon avm's, multiple small bowel avm's, fresh blood in prox small bowel.  . AVM (arteriovenous malformation) of colon     a. 2006 colonscopy  . Chronic respiratory failure   . COPD (chronic obstructive pulmonary disease)   . Chronic diastolic CHF (congestive heart failure)   . Normocytic anemia 03/2012  . Complication of anesthesia   . PONV (postoperative nausea and vomiting)   . GERD (gastroesophageal reflux disease)   . Pulmonary embolism     SP IVC filter    Current Outpatient Prescriptions  Medication Sig Dispense Refill  . acetaminophen (TYLENOL) 325 MG tablet Take 650 mg by mouth every 4 (four) hours as needed for mild pain or headache.      [name] aspirin 81 MG chewable tablet Chew 81 mg by mouth daily.      [name] atorvastatin (LIPITOR) 10 MG tablet Take 1 tablet (10 mg total) by mouth daily.  90 tablet  3  . budesonide (PULMICORT FLEXHALER) 180 MCG/ACT inhaler Inhale 1 puff into the lungs 2 (two) times daily.  1 Inhaler  6  . Cholecalciferol (VITAMIN D-3) 1000 UNITS CAPS Take 1,000 Units by mouth daily.       [name]. docusate sodium (COLACE) 100 MG capsule Take 1 capsule (100 mg total) by mouth 2 (two) times daily as needed for constipation.      [name]. donepezil (ARICEPT) 10 MG tablet Take 10 mg by mouth daily.       . fluticasone (FLONASE) 50 MCG/ACT nasal spray Place 2 sprays into the nose daily as needed for allergies.      . hydrALAZINE (APRESOLINE) 25 MG tablet Take 1 tablet (25 mg total) by mouth every 8 (eight) hours.  90 tablet  5  . lansoprazole (PREVACID) 15 MG capsule Take 1 capsule (15 mg total) by mouth daily.  90 capsule  1  . levalbuterol (XOPENEX HFA) 45 MCG/ACT inhaler Inhale 1-2 puffs into the  lungs every 4 (four) hours as needed for wheezing or shortness of breath.       . magnesium hydroxide (MILK OF MAGNESIA) 400 MG/5ML suspension Take 15 mLs by mouth daily as needed for constipation.      . metFORMIN (GLUCOPHAGE) 500 MG tablet Take 1 tablet (500 mg total) by mouth 3 (three) times daily.  90 tablet  11  . mirtazapine (REMERON) 15 MG tablet Take 15 mg by mouth at bedtime.      . polyethylene glycol (MIRALAX / GLYCOLAX) packet Take 17 g by mouth daily as needed (constipation).      . Protein POWD Take 2 scoop by mouth 3 (three) times daily.      [name]. tiotropium (SPIRIVA HANDIHALER) 18 MCG inhalation capsule Place 1 capsule (18 mcg total) into inhaler and inhale daily.  30 capsule  5  . vitamin B-12 (CYANOCOBALAMIN) 1000 MCG tablet Take 2,000 mcg by mouth every morning.        No current facility-administered medications for this visit.    Allergies:   Azithromycin; Codeine; Coumadin; and Oxybutynin chloride er   Social History:  The patient  reports that she quit smoking about 17 months ago. Her smoking use included Cigarettes. She has a 25 pack-year smoking history. She has never used smokeless tobacco. She reports that she does not drink alcohol or use illicit drugs.   Family History:  The patient's family history includes Cancer in her brother, brother, sister, and sister; Diabetes in her brother, father, mother, sister, and son; Heart disease in her brother, brother, and sister; Stroke in her father and mother.   ROS:  Please see the history of present illness.      All other systems reviewed and negative.   PHYSICAL EXAM: VS:  BP 121/79  Pulse 46  Ht 5\' 6"  (1.676 m)  Wt 149 lb (67.586 kg)  BMI 24.06 kg/m2 Well nourished, well developed, in no acute distress HEENT: normal Neck: no JVD Cardiac:  normal S1, S2; RRR; no murmur Lungs:  clear to auscultation bilaterally, no wheezing, rhonchi or rales Abd: soft, nontender, no hepatomegaly Ext: no edema Skin: warm and  dry Neuro:  CNs 2-12 intact, no focal abnormalities noted  EKG:  Sinus bradycardia, HR 46, normal axis, no ST changes     ASSESSMENT AND PLAN:  1. Syncope:  No further episodes. 2. Bradycardia:  Continues to have sinus bradycardia.  She is no longer on AV nodal blocking agents. She remains on Aricept. This may be contributing to bradycardia. I have asked her to follow up with her primary care physician to consider changing  to a different medicine for dementia that would not likely cause bradycardia. If she continues to have bradycardia off of this drug, consider placing her on a Holter monitor. 3. Paroxysmal Atrial Fibrillation:  Maintaining NSR.  She is not an anticoagulation candidate 2/2 hx of GI bleeding.   4. Hypertension:  Controlled. I will decrease her hydralazine to twice a day dosing to allow more blood pressure and hopefully decrease some of her dizziness 5. Chronic Diastolic CHF:  Volume stable. 6. History of Pulmonary Embolism, s/p IVC Filter:   She is not an anticoagulation candidate 2/2 hx of GI bleeding. 7. Dementia:  She stays in the memory care unit at [facility]. F/u with PCP to possibly change Aricept. 8. Disposition:  F/u with Dr. Rollene Rotunda in 6 weeks.   Signed, Tereso Newcomer, PA-C  09/23/2013 10:21 AM

## 2013-09-23 NOTE — Patient Instructions (Addendum)
DECREASE HYDRALAZINE TO 25 MG TWICE DAILY (EVERY 12 HOURS)  YOU WILL NEED TO FOLLOW UP WITH YOUR PRIMARY CARE PHYSICIAN WITHIN ABOUT 2 WEEKS TO DISCUSS ABOUT POSSIBLE CHANGE FROM ARICEPT TO ANOTHER MEDICATION THAT MAY NOT CAUSE BRADYCARDIA PER SCOTT WEAVER, [facility]  Your physician recommends that you schedule a follow-up appointment ON 11/09/13 @ 9:45 WITH DR. HOCHREIN

## 2013-10-05 ENCOUNTER — Telehealth: Payer: Self-pay | Admitting: *Deleted

## 2013-10-05 NOTE — Telephone Encounter (Signed)
Form completed, faxed to [phone]. Will await approval/denial. Carron CurieJennifer Castillo, CMA

## 2013-10-05 NOTE — Telephone Encounter (Signed)
Received a fax that the pt Xopenex inhaler needs a PA. Pt ID # [id number], contact 3 is [phone]. Pt has history of A-fib so she cannot take albuterol. I called the # given and initiated PA over the phone. Will await fax top triage fax. Carron CurieJennifer Luann Aspinwall, CMA

## 2013-10-07 NOTE — Telephone Encounter (Signed)
Received approval for xopenex inhaler through 06-30-14. Pharmacy advised. Carron CurieJennifer Castillo, CMA

## 2013-10-31 NOTE — Progress Notes (Signed)
[facility]. Ok per MD for return to facility today. [facility]. Patient wants to return there; her son Onalee HuaDavid was notified and stated that he would come and pick patient up to return to facilty.  Fl2 not required due to observation status.  Nursing notified. CSW signing off.  Lorri Frederickonna T. West PughCrowder, LCSWA  [phone]

## 2013-11-09 ENCOUNTER — Ambulatory Visit: Payer: Medicare Other | Admitting: Cardiology

## 2013-11-30 ENCOUNTER — Encounter (INDEPENDENT_AMBULATORY_CARE_PROVIDER_SITE_OTHER): Payer: Self-pay

## 2013-11-30 ENCOUNTER — Ambulatory Visit (INDEPENDENT_AMBULATORY_CARE_PROVIDER_SITE_OTHER): Payer: Medicare Other | Admitting: Family Medicine

## 2013-11-30 ENCOUNTER — Encounter: Payer: Self-pay | Admitting: Family Medicine

## 2013-11-30 VITALS — BP 136/74 | HR 75 | Temp 98.3°F | Ht 66.0 in | Wt 154.5 lb

## 2013-11-30 DIAGNOSIS — I498 Other specified cardiac arrhythmias: Secondary | ICD-10-CM

## 2013-11-30 DIAGNOSIS — I10 Essential (primary) hypertension: Secondary | ICD-10-CM

## 2013-11-30 DIAGNOSIS — R001 Bradycardia, unspecified: Secondary | ICD-10-CM

## 2013-11-30 DIAGNOSIS — J449 Chronic obstructive pulmonary disease, unspecified: Secondary | ICD-10-CM

## 2013-11-30 DIAGNOSIS — E119 Type 2 diabetes mellitus without complications: Secondary | ICD-10-CM

## 2013-11-30 DIAGNOSIS — F0390 Unspecified dementia without behavioral disturbance: Secondary | ICD-10-CM

## 2013-11-30 LAB — COMPREHENSIVE METABOLIC PANEL
ALT: 17 U/L (ref 0–35)
AST: 16 U/L (ref 0–37)
Albumin: 3.9 g/dL (ref 3.5–5.2)
Alkaline Phosphatase: 64 U/L (ref 39–117)
BILIRUBIN TOTAL: 0.3 mg/dL (ref 0.2–1.2)
BUN: 18 mg/dL (ref 6–23)
CO2: 30 mEq/L (ref 19–32)
CREATININE: 0.9 mg/dL (ref 0.4–1.2)
Calcium: 9.8 mg/dL (ref 8.4–10.5)
Chloride: 100 mEq/L (ref 96–112)
GFR: 65.4 mL/min (ref >60.00)
Glucose, Bld: 121 mg/dL — ABNORMAL HIGH (ref 70–99)
Potassium: 4.1 mEq/L (ref 3.5–5.1)
SODIUM: 139 meq/L (ref 135–145)
TOTAL PROTEIN: 6.9 g/dL (ref 6.0–8.3)

## 2013-11-30 LAB — CBC WITH DIFFERENTIAL/PLATELET
Basophils Absolute: 0.1 10*3/uL (ref 0.0–0.1)
Basophils Relative: 0.7 % (ref 0.0–3.0)
EOS ABS: 0.1 10*3/uL (ref 0.0–0.7)
Eosinophils Relative: 1.6 % (ref 0.0–5.0)
HEMATOCRIT: 34.4 % — AB (ref 36.0–46.0)
HEMOGLOBIN: 11.5 g/dL — AB (ref 12.0–15.0)
Lymphocytes Relative: 27.6 % (ref 12.0–46.0)
Lymphs Abs: 2.3 10*3/uL (ref 0.7–4.0)
MCHC: 33.4 g/dL (ref 30.0–36.0)
MCV: 89.5 fl (ref 78.0–100.0)
MONO ABS: 0.6 10*3/uL (ref 0.1–1.0)
Monocytes Relative: 7.6 % (ref 3.0–12.0)
NEUTROS ABS: 5.2 10*3/uL (ref 1.4–7.7)
Neutrophils Relative %: 62.5 % (ref 43.0–77.0)
Platelets: 268 10*3/uL (ref 150.0–400.0)
RBC: 3.85 Mil/uL — ABNORMAL LOW (ref 3.87–5.11)
RDW: 13.3 % (ref 11.5–15.5)
WBC: 8.4 10*3/uL (ref 4.0–10.5)

## 2013-11-30 LAB — HEMOGLOBIN A1C: Hgb A1c MFr Bld: 7.4 % — ABNORMAL HIGH (ref 4.6–6.5)

## 2013-11-30 LAB — LIPID PANEL
CHOL/HDL RATIO: 3
Cholesterol: 162 mg/dL (ref 0–200)
HDL: 59.1 mg/dL (ref >39.00)
LDL Cholesterol: 72 mg/dL (ref 0–99)
NonHDL: 102.9
Triglycerides: 156 mg/dL — ABNORMAL HIGH (ref 0.0–149.0)
VLDL: 31.2 mg/dL (ref 0.0–40.0)

## 2013-11-30 LAB — TSH: TSH: 1.74 u[IU]/mL (ref 0.35–4.50)

## 2013-11-30 NOTE — Patient Instructions (Signed)
I'm glad you are doing better  Alert me if any fainting or near fainting  Please check pulse at your residence daily at rest for a month and send me a record Lab today I'm not going to change aricept unless we see evidence of low pulse (namenda would be another option) Follow up in about 6 months

## 2013-11-30 NOTE — Progress Notes (Signed)
Pre visit review using our clinic review tool, if applicable. No additional management support is needed unless otherwise documented below in the visit note. 

## 2013-11-30 NOTE — Progress Notes (Signed)
Subjective:    Patient ID: [name], female    DOB: [birth year], 80 y.o.   MRN: [id number]  HPI Here for f/u of chronic medical issues  Wt is up 5 lb with bmi of 24 Pt states "they make me eat 5 times per day" She does not love the food at her residence   bp is stable today  Cardiology did dec her hydralazine  No cp or palpitations or headaches or edema  No side effects to medicines  BP Readings from Last 3 Encounters:  11/30/13 136/74  09/23/13 121/79  09/16/13 120/72     Some concern re; aricept causing bradycardia - pulse is fine today   (but she is generally bradycardic when she goes to the hospital) This side eff is over 1% of pts  Family thinks she is doing much better  Thinks dig was the problem originally  No more syncope- and more perky since stopping dig and dec hydralazine   Copd- doing well / last visit to pulmonary was good   DM Lab Results  Component Value Date   HGBA1C 7.4* 06/01/2013    Due for an A1C  C/o being cold all the time  No longer feels light headed   Patient Active Problem List   Diagnosis Date Noted  . Syncope 08/26/2013  . Bradycardia 08/26/2013  . Hip pain 06/30/2013  . Fall at nursing home 06/30/2013  . Near syncope 09/30/2012  . Headache 09/30/2012  . Failure to thrive in adult 09/25/2012  . Depression 09/25/2012  . Abdominal pain, lower 09/25/2012  . Vitamin B12 deficiency 09/08/2012  . Hypertrophic toenail 07/21/2012  . OSA (obstructive sleep apnea) 07/15/2012  . PE (pulmonary embolism) 06/17/2012  . Chest pain 06/16/2012  . Esophageal dysmotility suspected 06/15/2012  . GERD (gastroesophageal reflux disease) 06/15/2012  . Acute and chronic respiratory failure 04/23/2012  . Dementia 04/21/2012  . Chronic diastolic heart failure 04/21/2012  . COPD (chronic obstructive pulmonary disease) 04/21/2012  . Mixed incontinence urge and stress 01/04/2011  . UNSPECIFIED VITAMIN D DEFICIENCY 07/14/2010  . ANEMIA 01/24/2009  .  Former smoker 11/04/2007  . MORTON'S NEUROMA 11/03/2007  . MACULAR DEGENERATION 11/03/2007  . HYPERTENSION 11/03/2007  . Paroxysmal atrial fibrillation 11/03/2007  . ALLERGIC RHINITIS 11/03/2007  . OSTEOPOROSIS 11/03/2007  . DIABETES MELLITUS, TYPE II 11/03/2007  . HYPERCHOLESTEROLEMIA 11/25/2006   Past Medical History  Diagnosis Date  . Allergy     allergic Rhintis  . Hyperlipidemia   . Hypertension   . Osteoporosis   . PAF (paroxysmal atrial fibrillation)     a. PAF dates back > 5 yrs, prev on coumadin->d/c 2006 2/2 GIB;   . Carotid stenosis     a. 09/2007: Carotid Doppler stable- stable bilateral stenosis 40-59% (from 06)  . Type II diabetes mellitus   . Tobacco abuse     a. ongoing - 50+ pack yr hx.  . Dementia     Dementia in Hospital //Does O.K. at home.  . Diverticulosis   . GI bleeding     a. when on coumadin in 2006 - colonoscopy @ that time showed 2 large right colon avm's, multiple small bowel avm's, fresh blood in prox small bowel.  . AVM (arteriovenous malformation) of colon     a. 2006 colonscopy  . Chronic respiratory failure   . COPD (chronic obstructive pulmonary disease)   . Chronic diastolic CHF (congestive heart failure)   . Normocytic anemia 03/2012  . Complication of anesthesia   .  PONV (postoperative nausea and vomiting)   . GERD (gastroesophageal reflux disease)   . Pulmonary embolism     SP IVC filter   Past Surgical History  Procedure Laterality Date  . Cardiac catheterization  1990's    clear  . Orif tibia & fibula fractures  05/2004  . Small bowel enteroscopy  03/2005    AVMS  . Colonoscopy  08/2004  . Esophagogastroduodenoscopy  08/2004    Multiple    History  Substance Use Topics  . Smoking status: Former Smoker -- 0.50 packs/day for 50 years    Types: Cigarettes    Quit date: 04/21/2012  . Smokeless tobacco: Never Used  . Alcohol Use: No   Family History  Problem Relation Age of Onset  . Stroke Mother     a. believes she died  suddenly @ 27.  . Diabetes Mother   . Stroke Father     Died from cerebral hemorrhage @ 80  . Diabetes Father   . Cancer Sister     colon and stomach cancer  . Diabetes Brother   . Heart disease Brother   . Cancer Brother     died from lung cancer  . Heart disease Brother     MI  . Cancer Sister     ? liver//brain cancer  . Heart disease Sister   . Diabetes Sister     diabetes and glaucoma  . Cancer Brother     pancreatic cancer  . Diabetes Son    Allergies  Allergen Reactions  . Azithromycin Other (See Comments)    inc blood sugar  . Codeine Itching  . Coumadin [Warfarin] Other (See Comments)    Causes bleeding  . Oxybutynin Chloride Er Other (See Comments)    Constipation/ dizziness/ dry mouth   Current Outpatient Prescriptions on File Prior to Visit  Medication Sig Dispense Refill  . acetaminophen (TYLENOL) 325 MG tablet Take 650 mg by mouth every 4 (four) hours as needed for mild pain or headache.      [name] aspirin 81 MG chewable tablet Chew 81 mg by mouth daily.      [name] atorvastatin (LIPITOR) 10 MG tablet Take 1 tablet (10 mg total) by mouth daily.  90 tablet  3  . budesonide (PULMICORT FLEXHALER) 180 MCG/ACT inhaler Inhale 1 puff into the lungs 2 (two) times daily.  1 Inhaler  6  . Cholecalciferol (VITAMIN D-3) 1000 UNITS CAPS Take 1,000 Units by mouth daily.       [name] docusate sodium (COLACE) 100 MG capsule Take 1 capsule (100 mg total) by mouth 2 (two) times daily as needed for constipation.      [name] donepezil (ARICEPT) 10 MG tablet Take 10 mg by mouth daily.       . fluticasone (FLONASE) 50 MCG/ACT nasal spray Place 2 sprays into the nose daily as needed for allergies.      . hydrALAZINE (APRESOLINE) 25 MG tablet Take 1 tablet (25 mg total) by mouth 2 (two) times daily.      . lansoprazole (PREVACID) 15 MG capsule Take 1 capsule (15 mg total) by mouth daily.  90 capsule  1  . levalbuterol (XOPENEX HFA) 45 MCG/ACT inhaler Inhale 1-2 puffs into the lungs every 4 (four)  hours as needed for wheezing or shortness of breath.       . magnesium hydroxide (MILK OF MAGNESIA) 400 MG/5ML suspension Take 15 mLs by mouth daily as needed for constipation.      . metFORMIN (  GLUCOPHAGE) 500 MG tablet Take 1 tablet (500 mg total) by mouth 3 (three) times daily.  90 tablet  11  . mirtazapine (REMERON) 15 MG tablet Take 15 mg by mouth at bedtime.      . polyethylene glycol (MIRALAX / GLYCOLAX) packet Take 17 g by mouth daily as needed (constipation).      . Protein POWD Take 2 scoop by mouth 3 (three) times daily.      [name]. tiotropium (SPIRIVA HANDIHALER) 18 MCG inhalation capsule Place 1 capsule (18 mcg total) into inhaler and inhale daily.  30 capsule  5  . vitamin B-12 (CYANOCOBALAMIN) 1000 MCG tablet Take 2,000 mcg by mouth every morning.        No current facility-administered medications on file prior to visit.    Review of Systems    Review of Systems  Constitutional: Negative for fever, , fatigue and unexpected weight change. pos for improved affect and appetite  Eyes: Negative for pain and visual disturbance.  Respiratory: Negative for cough and shortness of breath.   Cardiovascular: Negative for cp or palpitations    Gastrointestinal: Negative for nausea, diarrhea and constipation.  Genitourinary: Negative for urgency and frequency.  Skin: Negative for pallor or rash   Neurological: Negative for weakness, light-headedness, numbness and headaches.  Hematological: Negative for adenopathy. Does not bruise/bleed easily.  Psychiatric/Behavioral: Negative for dysphoric mood. The patient is intermittently anxious , pos for short term memory loss      Objective:   Physical Exam  Constitutional: She appears well-developed and well-nourished. No distress.  Frail app elderly female with walker   HENT:  Head: Normocephalic and atraumatic.  Mouth/Throat: Oropharynx is clear and moist.  Eyes: Conjunctivae and EOM are normal. Pupils are equal, round, and reactive to light.  No scleral icterus.  Neck: Normal range of motion. Neck supple. No JVD present. No thyromegaly present.  Cardiovascular: Normal rate and intact distal pulses.  Exam reveals no gallop.   Pulmonary/Chest: Effort normal and breath sounds normal. No respiratory distress. She has no wheezes. She has no rales.  Abdominal: Soft. Bowel sounds are normal. She exhibits no distension and no mass. There is no tenderness.  Musculoskeletal: She exhibits no edema.  Lymphadenopathy:    She has no cervical adenopathy.  Neurological: She is alert. She has normal reflexes. No cranial nerve deficit. She exhibits normal muscle tone.  Skin: Skin is warm and dry. No rash noted. No erythema. No pallor.  Psychiatric:  Improved affect  Talkative and interactive Poor short term memory            Assessment & Plan:   Problem List Items Addressed This Visit     Cardiovascular and Mediastinum   HYPERTENSION - Primary      bp in fair control at this time  BP Readings from Last 1 Encounters:  11/30/13 136/74   No changes needed Disc lifstyle change with low sodium diet and exercise  Lab today      Relevant Orders      Comprehensive metabolic panel (Completed)      CBC with Differential (Completed)      TSH (Completed)      Lipid panel (Completed)     Respiratory   COPD (chronic obstructive pulmonary disease)     Overall stable  No change in med  Exam reassuring       Endocrine   DIABETES MELLITUS, TYPE II     A1C today  Doing better overall with diet and eating -  failure to thrive is no longer a problem Willing to let A1c be slt high in order to allow her to eat what she wants (with dementia)    Relevant Orders      Hemoglobin A1c (Completed)     Nervous and Auditory   Dementia     Much improved with recent stop of digoxin/ and dec of hydralazine from her cardiologist Bradycardia is better/ energy level/ vitality and interest in socializing and eating are much imp  Will watch heart  rate- have is monitored at her residence daily-if stable I do not want to change aricept  If have to change-would consider namenda       Other   Bradycardia     Imp after stopping dig (along with general well being) Monitor daily at her residence  If stable will not change aricept

## 2013-12-01 ENCOUNTER — Telehealth: Payer: Self-pay | Admitting: Family Medicine

## 2013-12-01 NOTE — Telephone Encounter (Signed)
Relevant patient education mailed to patient.  

## 2013-12-02 NOTE — Assessment & Plan Note (Signed)
Imp after stopping dig (along with general well being) Monitor daily at her residence  If stable will not change aricept

## 2013-12-02 NOTE — Assessment & Plan Note (Signed)
bp in fair control at this time  BP Readings from Last 1 Encounters:  11/30/13 136/74   No changes needed Disc lifstyle change with low sodium diet and exercise  Lab today

## 2013-12-02 NOTE — Assessment & Plan Note (Signed)
Overall stable  No change in med  Exam reassuring

## 2013-12-02 NOTE — Assessment & Plan Note (Signed)
A1C today  Doing better overall with diet and eating - failure to thrive is no longer a problem Willing to let A1c be slt high in order to allow her to eat what she wants (with dementia)

## 2013-12-02 NOTE — Assessment & Plan Note (Signed)
Much improved with recent stop of digoxin/ and dec of hydralazine from her cardiologist Bradycardia is better/ energy level/ vitality and interest in socializing and eating are much imp  Will watch heart rate- have is monitored at her residence daily-if stable I do not want to change aricept  If have to change-would consider namenda

## 2014-05-04 ENCOUNTER — Encounter: Payer: Self-pay | Admitting: Family Medicine

## 2014-06-03 ENCOUNTER — Encounter: Payer: Self-pay | Admitting: Family Medicine

## 2014-06-03 ENCOUNTER — Ambulatory Visit (INDEPENDENT_AMBULATORY_CARE_PROVIDER_SITE_OTHER): Payer: Medicare Other | Admitting: Family Medicine

## 2014-06-03 VITALS — BP 124/74 | HR 78 | Temp 98.0°F | Ht 66.0 in | Wt 162.0 lb

## 2014-06-03 DIAGNOSIS — E538 Deficiency of other specified B group vitamins: Secondary | ICD-10-CM

## 2014-06-03 DIAGNOSIS — I10 Essential (primary) hypertension: Secondary | ICD-10-CM

## 2014-06-03 DIAGNOSIS — F0390 Unspecified dementia without behavioral disturbance: Secondary | ICD-10-CM

## 2014-06-03 DIAGNOSIS — E785 Hyperlipidemia, unspecified: Secondary | ICD-10-CM

## 2014-06-03 DIAGNOSIS — E119 Type 2 diabetes mellitus without complications: Secondary | ICD-10-CM

## 2014-06-03 NOTE — Progress Notes (Signed)
Subjective:    Patient ID: [name], female    DOB: 07/03/1929, 80 y.o.   MRN: [id number]  HPI Overall doing fairly well  [facility]  Dementia - her symptoms wax and wane  No downward turn  Sometimes agitated  Confused a fair amount of the time - but has clear moments Does confabulate and mood is changeable  Her residence plans to have neuropsyche eval when avail - will do that    Had a urine cx in nov-was normal   On aricept and namenda -no changes    Wt is up 8 lb - eating well  Getting protein powder also   bp is stable today  No cp or palpitations or headaches or edema  No side effects to medicines  BP Readings from Last 3 Encounters:  06/03/14 124/74  11/30/13 136/74  09/23/13 121/79     DM due for A1C May be up with inc calorie intake Lab Results  Component Value Date   HGBA1C 7.4* 11/30/2013     Copd -no changes - no worsening   Due for B12 check Lab Results  Component Value Date   VITAMINB12 367 10/21/2012    Takes 1000 mcg once daily  Per son - her care place for eval from psych - she may have that done soon    Patient Active Problem List   Diagnosis Date Noted  . Syncope 08/26/2013  . Bradycardia 08/26/2013  . Hip pain 06/30/2013  . Fall at nursing home 06/30/2013  . Near syncope 09/30/2012  . Headache 09/30/2012  . Failure to thrive in adult 09/25/2012  . Depression 09/25/2012  . Abdominal pain, lower 09/25/2012  . Vitamin B12 deficiency 09/08/2012  . Hypertrophic toenail 07/21/2012  . OSA (obstructive sleep apnea) 07/15/2012  . PE (pulmonary embolism) 06/17/2012  . Chest pain 06/16/2012  . Esophageal dysmotility suspected 06/15/2012  . GERD (gastroesophageal reflux disease) 06/15/2012  . Acute and chronic respiratory failure 04/23/2012  . Dementia 04/21/2012  . Chronic diastolic heart failure 04/21/2012  . COPD (chronic obstructive pulmonary disease) 04/21/2012  . Mixed incontinence urge and stress  01/04/2011  . UNSPECIFIED VITAMIN D DEFICIENCY 07/14/2010  . ANEMIA 01/24/2009  . Former smoker 11/04/2007  . MORTON'S NEUROMA 11/03/2007  . MACULAR DEGENERATION 11/03/2007  . Essential hypertension 11/03/2007  . Paroxysmal atrial fibrillation 11/03/2007  . ALLERGIC RHINITIS 11/03/2007  . OSTEOPOROSIS 11/03/2007  . Diabetes type 2, controlled 11/03/2007  . HYPERCHOLESTEROLEMIA 11/25/2006   Past Medical History  Diagnosis Date  . Allergy     allergic Rhintis  . Hyperlipidemia   . Hypertension   . Osteoporosis   . PAF (paroxysmal atrial fibrillation)     a. PAF dates back > 5 yrs, prev on coumadin->d/c 2006 2/2 GIB;   . Carotid stenosis     a. 09/2007: Carotid Doppler stable- stable bilateral stenosis 40-59% (from 06)  . Type II diabetes mellitus   . Tobacco abuse     a. ongoing - 50+ pack yr hx.  . Dementia     Dementia in Hospital //Does O.K. at home.  . Diverticulosis   . GI bleeding     a. when on coumadin in 2006 - colonoscopy @ that time showed 2 large right colon avm's, multiple small bowel avm's, fresh blood in prox small bowel.  . AVM (arteriovenous malformation) of colon     a. 2006 colonscopy  . Chronic respiratory failure   . COPD (chronic obstructive pulmonary disease)   .  Chronic diastolic CHF (congestive heart failure)   . Normocytic anemia 03/2012  . Complication of anesthesia   . PONV (postoperative nausea and vomiting)   . GERD (gastroesophageal reflux disease)   . Pulmonary embolism     SP IVC filter   Past Surgical History  Procedure Laterality Date  . Cardiac catheterization  1990's    clear  . Orif tibia & fibula fractures  05/2004  . Small bowel enteroscopy  03/2005    AVMS  . Colonoscopy  08/2004  . Esophagogastroduodenoscopy  08/2004    Multiple    History  Substance Use Topics  . Smoking status: Former Smoker -- 0.50 packs/day for 50 years    Types: Cigarettes    Quit date: 04/21/2012  . Smokeless tobacco: Never Used  . Alcohol Use: No    Family History  Problem Relation Age of Onset  . Stroke Mother     a. believes she died suddenly @ 45.  . Diabetes Mother   . Stroke Father     Died from cerebral hemorrhage @ 53  . Diabetes Father   . Cancer Sister     colon and stomach cancer  . Diabetes Brother   . Heart disease Brother   . Cancer Brother     died from lung cancer  . Heart disease Brother     MI  . Cancer Sister     ? liver//brain cancer  . Heart disease Sister   . Diabetes Sister     diabetes and glaucoma  . Cancer Brother     pancreatic cancer  . Diabetes Son    Allergies  Allergen Reactions  . Azithromycin Other (See Comments)    inc blood sugar  . Codeine Itching  . Coumadin [Warfarin] Other (See Comments)    Causes bleeding  . Oxybutynin Chloride Er Other (See Comments)    Constipation/ dizziness/ dry mouth   Current Outpatient Prescriptions on File Prior to Visit  Medication Sig Dispense Refill  . acetaminophen (TYLENOL) 325 MG tablet Take 650 mg by mouth every 4 (four) hours as needed for mild pain or headache.    [name] aspirin 81 MG chewable tablet Chew 81 mg by mouth daily.    [name] atorvastatin (LIPITOR) 10 MG tablet Take 1 tablet (10 mg total) by mouth daily. 90 tablet 3  . budesonide (PULMICORT FLEXHALER) 180 MCG/ACT inhaler Inhale 1 puff into the lungs 2 (two) times daily. 1 Inhaler 6  . Cholecalciferol (VITAMIN D-3) 1000 UNITS CAPS Take 1,000 Units by mouth daily.     [name] docusate sodium (COLACE) 100 MG capsule Take 1 capsule (100 mg total) by mouth 2 (two) times daily as needed for constipation.    [name] donepezil (ARICEPT) 10 MG tablet Take 10 mg by mouth daily.     . fluticasone (FLONASE) 50 MCG/ACT nasal spray Place 2 sprays into the nose daily as needed for allergies.    . hydrALAZINE (APRESOLINE) 25 MG tablet Take 1 tablet (25 mg total) by mouth 2 (two) times daily.    . lansoprazole (PREVACID) 15 MG capsule Take 1 capsule (15 mg total) by mouth daily. 90 capsule 1  . levalbuterol (XOPENEX  HFA) 45 MCG/ACT inhaler Inhale 1-2 puffs into the lungs every 4 (four) hours as needed for wheezing or shortness of breath.     . magnesium hydroxide (MILK OF MAGNESIA) 400 MG/5ML suspension Take 15 mLs by mouth daily as needed for constipation.    . metFORMIN (GLUCOPHAGE) 500 MG tablet  Take 1 tablet (500 mg total) by mouth 3 (three) times daily. 90 tablet 11  . mirtazapine (REMERON) 15 MG tablet Take 15 mg by mouth at bedtime.    . polyethylene glycol (MIRALAX / GLYCOLAX) packet Take 17 g by mouth daily as needed (constipation).    . Protein POWD Take 2 scoop by mouth 3 (three) times daily.    [name]. tiotropium (SPIRIVA HANDIHALER) 18 MCG inhalation capsule Place 1 capsule (18 mcg total) into inhaler and inhale daily. 30 capsule 5  . vitamin B-12 (CYANOCOBALAMIN) 1000 MCG tablet Take 1,000 mcg by mouth every morning.      No current facility-administered medications on file prior to visit.    Review of Systems    Review of Systems  Constitutional: Negative for fever, appetite change, fatigue and unexpected weight change. (pos for expected wt gain from inc po intake) Eyes: Negative for pain and visual disturbance.  Respiratory: Negative for cough and shortness of breath.   Cardiovascular: Negative for cp or palpitations    Gastrointestinal: Negative for nausea, diarrhea and constipation.  Genitourinary: Negative for urgency and frequency.  Skin: Negative for pallor or rash   Neurological: Negative for weakness, light-headedness, numbness and headaches.  Hematological: Negative for adenopathy. Does not bruise/bleed easily.  Psychiatric/Behavioral: Negative for dysphoric mood.  Pos for intermittent anxiety and agitation , pos for dementia .      Objective:   Physical Exam  Constitutional: She appears well-developed and well-nourished. No distress.  Well appearing elderly female with dementia   HENT:  Head: Normocephalic and atraumatic.  Mouth/Throat: Oropharynx is clear and moist.  Eyes:  Conjunctivae and EOM are normal. Pupils are equal, round, and reactive to light. Right eye exhibits no discharge. No scleral icterus.  Neck: Normal range of motion. Neck supple. No JVD present. No thyromegaly present.  Cardiovascular: Normal rate, regular rhythm, normal heart sounds and intact distal pulses.  Exam reveals no gallop.   Pulmonary/Chest: Effort normal and breath sounds normal. No respiratory distress. She has no wheezes. She has no rales.  Abdominal: Soft. Bowel sounds are normal. She exhibits no distension and no mass. There is no tenderness.  Musculoskeletal: She exhibits no edema or tenderness.  Kyphosis noted   Lymphadenopathy:    She has no cervical adenopathy.  Neurological: She is alert. She has normal reflexes. No cranial nerve deficit. She exhibits normal muscle tone. Coordination normal.  Skin: Skin is warm and dry. No rash noted. No erythema. No pallor.  Psychiatric: She has a normal mood and affect.  Very talkative  Confabulates often Tangential Pleasant and cheerful           Assessment & Plan:   Problem List Items Addressed This Visit      Cardiovascular and Mediastinum   Essential hypertension - Primary    bp in fair control at this time  BP Readings from Last 1 Encounters:  06/03/14 124/74   No changes needed Disc lifstyle change with low sodium diet and exercise   Lab today    Relevant Orders      Comprehensive metabolic panel     Digestive   Vitamin B12 deficiency    Getting oral B12 1000 mcg daily Check level today    Relevant Orders      Vitamin B12     Endocrine   Diabetes type 2, controlled    Lab Results  Component Value Date   HGBA1C 7.4* 11/30/2013    A1C today- expect this may be up  due to inc po intake and wt gain Will adj tx accordingly  Is on Dm diet at her facility      Relevant Orders      Hemoglobin A1c      Lipid panel     Nervous and Auditory   Dementia    This appears to be stable  Mood is changeable -  she will have psych eval at her facility when they come out  Continue current medicines Enc to stay social as well

## 2014-06-03 NOTE — Patient Instructions (Signed)
Follow up in 6 months for annual exam  Labs today  Vitals stable

## 2014-06-03 NOTE — Progress Notes (Signed)
Pre visit review using our clinic review tool, if applicable. No additional management support is needed unless otherwise documented below in the visit note. 

## 2014-06-05 LAB — LIPID PANEL
CHOL/HDL RATIO: 4
Cholesterol: 180 mg/dL (ref 0–200)
HDL: 48.8 mg/dL (ref >39.00)
NonHDL: 131.2
Triglycerides: 271 mg/dL — ABNORMAL HIGH (ref 0.0–149.0)
VLDL: 54.2 mg/dL — ABNORMAL HIGH (ref 0.0–40.0)

## 2014-06-05 LAB — HEMOGLOBIN A1C: Hgb A1c MFr Bld: 7.6 % — ABNORMAL HIGH (ref 4.6–6.5)

## 2014-06-05 LAB — COMPREHENSIVE METABOLIC PANEL
ALT: 25 U/L (ref 0–35)
AST: 23 U/L (ref 0–37)
Albumin: 4.2 g/dL (ref 3.5–5.2)
Alkaline Phosphatase: 59 U/L (ref 39–117)
BUN: 28 mg/dL — ABNORMAL HIGH (ref 6–23)
CALCIUM: 9.8 mg/dL (ref 8.4–10.5)
CO2: 24 meq/L (ref 19–32)
Chloride: 106 mEq/L (ref 96–112)
Creatinine, Ser: 1 mg/dL (ref 0.4–1.2)
GFR: 53.86 mL/min — ABNORMAL LOW (ref >60.00)
Glucose, Bld: 161 mg/dL — ABNORMAL HIGH (ref 70–99)
POTASSIUM: 4.6 meq/L (ref 3.5–5.1)
Sodium: 143 mEq/L (ref 135–145)
Total Bilirubin: 0.4 mg/dL (ref 0.2–1.2)
Total Protein: 6.8 g/dL (ref 6.0–8.3)

## 2014-06-05 LAB — LDL CHOLESTEROL, DIRECT: Direct LDL: 101.3 mg/dL

## 2014-06-05 LAB — VITAMIN B12: Vitamin B-12: 823 pg/mL (ref 211–911)

## 2014-06-05 NOTE — Assessment & Plan Note (Signed)
bp in fair control at this time  BP Readings from Last 1 Encounters:  06/03/14 124/74   No changes needed Disc lifstyle change with low sodium diet and exercise   Lab today

## 2014-06-05 NOTE — Assessment & Plan Note (Signed)
This appears to be stable  Mood is changeable - she will have psych eval at her facility when they come out  Continue current medicines Enc to stay social as well

## 2014-06-05 NOTE — Assessment & Plan Note (Signed)
Lab Results  Component Value Date   HGBA1C 7.4* 11/30/2013    A1C today- expect this may be up due to inc po intake and wt gain Will adj tx accordingly  Is on Dm diet at her facility

## 2014-06-05 NOTE — Assessment & Plan Note (Signed)
Getting oral B12 1000 mcg daily Check level today

## 2014-06-09 ENCOUNTER — Telehealth: Payer: Self-pay | Admitting: *Deleted

## 2014-06-09 MED ORDER — METFORMIN HCL 1000 MG PO TABS: 1000.0000 mg | ORAL_TABLET | Freq: Two times a day (BID) | ORAL | Status: AC

## 2014-06-09 NOTE — Telephone Encounter (Signed)
Pt's daughter Pearline Cablesaula Michalik notified of Dr. Royden Purlower's comments on pt's labs. [facility]'s pharmacy, [facility].  Gunnar Fusiaula wanted me to ask you do you think pt's protein powder shakes she takes to help her weight has a lot of sugar and maybe the reason her glucose/a1c is high. Also since pt's a1c/glucose/triglycerides are high do you think that her nursing home should start checking her blood sugar on a routinely basis (once a day or more?), as of right now they only check it if she starts to feel lightheaded or passes out to make sure they are not low, but they are not checking it daily to make sure it's not high, please advise

## 2014-06-09 NOTE — Telephone Encounter (Signed)
-----   Message from Judy PimpleMarne A Tower, MD sent at 06/06/2014  9:24 PM EST ----- Please result to a family member Glucose is up and triglycerides (with inc calorie intake)  Stop current metformin and change to metformin 1000 mg bid Please call in metformin 1000 mg 1 po bid # [phone] ref  Re check A1C  And bmet in 3 mo please (dx DM) Watch sugar in diet best you can B12 level is ok

## 2014-06-09 NOTE — Telephone Encounter (Signed)
She probably does not need the protein shakes anymore - has gained weight nicely (will watch this)  Do increase metformin  I do not think we need to start checking glucose regularly quite yet - but if things do not improve, we can Thanks

## 2014-06-09 NOTE — Telephone Encounter (Signed)
Pt's daughter notified of Dr. Royden Purlower's comments/recommendations. [facility] draw her labs in 3 months instead of bringing pt to our office.

## 2014-06-10 NOTE — Telephone Encounter (Signed)
Done and in IN box 

## 2014-06-16 NOTE — Telephone Encounter (Signed)
Orders faxed

## 2014-07-13 ENCOUNTER — Ambulatory Visit (INDEPENDENT_AMBULATORY_CARE_PROVIDER_SITE_OTHER): Payer: Medicare Other | Admitting: Internal Medicine

## 2014-07-13 ENCOUNTER — Encounter: Payer: Self-pay | Admitting: Internal Medicine

## 2014-07-13 VITALS — BP 128/78 | HR 77 | Ht 67.0 in | Wt 162.0 lb

## 2014-07-13 DIAGNOSIS — J449 Chronic obstructive pulmonary disease, unspecified: Secondary | ICD-10-CM | POA: Diagnosis not present

## 2014-07-13 NOTE — Progress Notes (Signed)
Subjective:    Patient ID: [name], female    DOB: 10/09/1929, 81 y.o.   MRN: [id number]  HPI   #Acute Respiratory Failure #Chronic Respiratory Failure / COPD - June 2010 - intubated with acinetobacter (resistant) lower lobe pneumonia following ankle fracture  - Admited 04/21/12 - 05/05/12 - Rx Bipap. Discharged on nocturnal NIPPV. In hospital PCO2 71  #Tobacco Abuse  - quit Oct 2013 post admission  #. Acute on Chronic Diastolic CHF  - EF 55-60% by echo 04/22/12  #Cachexia - Weight 150-->143lbs   - weight 137# on 01/08/2013 with Body mass index is 22.25 kg/(m^2).    # Atrial Fibrillation w/ RVR  - H/o PAF. Not anticoagulation candidate due to h/o AVM/GIB, dementia, and fall risk  - Conservative therapy with rate control   #Other issues   -  Diabetes Mellitus Type 2, Anemia of chronic disease  #Lives in [facility] - Memory Care Unit - since summer 2014      OV 07/13/2014  Chief Complaint  Patient presents with  . Follow-up    Pt stated her breathing has improved since last OV. Pt Pt c/o DOE. Pt denies CP/tightness and significiant cough.     FU COPD NOS, Sleep apnea - unable to use cpap,demenita 0 livess in memory care unit   This is a 9 month follow-up for COPD not otherwise specified. She is now only on Spiriva once daily. Xopenex as needed. In the past 9 months she has had no admissions for COPD or any other medical problems. No new medical diagnoses. No emergency room visits or urgent care visits or hospitalizations. She most recently followed up with primary care physician in December 2015 and deemed stable. In terms of her dementia that is also stable. COPD stable. COPD cat score which is more accurate today is 11 and his reported baseline. Her son is with her along with? Daughter-in-law. They report being up-to-date with vaccines   Immunization History  Administered Date(s) Administered  . Influenza Split 04/22/2012, 07/13/2012, 03/31/2014  .  Influenza Whole 05/08/2007, 03/28/2008, 03/28/2010  . PPD Test 11/17/2012  . Pneumococcal Conjugate-13 09/16/2013  . Pneumococcal Polysaccharide-23 05/26/2006, 04/22/2012  . Td 09/13/2003  . Zoster 07/01/2006    Past, Family, Social reviewed: no change since last visit     CAT COPD Symptom & Quality of Life Score (GSK trademark) 0 is no burden. 5 is highest burden 07/13/2012  09/16/2013  07/13/2014   Never Cough -> Cough all the time 4 1 0  No phlegm in chest -> Chest is full of phlegm 2 0 0  No chest tightness -> Chest feels very tight No dyspnea for 1 flight stairs/hill -> Very dyspneic for 1 flight of stairs 5 0 1  No limitations for ADL at home -> Very limited with ADL at home 5 0 2  Confident leaving home -> Not at all confident leaving home 3 0 2  Sleep soundly -> Do not sleep soundly because of lung condition 1 0 1  Lots of Energy -> No energy at all TOTAL Score (max 40)  Review of Systems  Constitutional: Negative for fever and unexpected weight change.  HENT: Negative for congestion, dental problem, ear pain, nosebleeds, postnasal drip, rhinorrhea, sinus pressure, sneezing, sore throat and trouble swallowing.   Eyes: Negative for redness and itching.  Respiratory: Positive for shortness of breath. Negative for cough, chest  tightness and wheezing.   Cardiovascular: Negative for palpitations and leg swelling.  Gastrointestinal: Negative for nausea and vomiting.  Genitourinary: Negative for dysuria.  Musculoskeletal: Negative for joint swelling.  Skin: Negative for rash.  Neurological: Negative for headaches.  Hematological: Does not bruise/bleed easily.  Psychiatric/Behavioral: Negative for dysphoric mood. The patient is not nervous/anxious.     Current outpatient prescriptions:  .  acetaminophen (TYLENOL) 325 MG tablet, Take 650 mg by mouth every 4 (four) hours as needed for mild pain or headache., Disp: , Rfl:  .  aspirin 81 MG chewable  tablet, Chew 81 mg by mouth daily., Disp: , Rfl:  .  atorvastatin (LIPITOR) 10 MG tablet, Take 1 tablet (10 mg total) by mouth daily., Disp: 90 tablet, Rfl: 3 .  budesonide (PULMICORT FLEXHALER) 180 MCG/ACT inhaler, Inhale 1 puff into the lungs 2 (two) times daily., Disp: 1 Inhaler, Rfl: 6 .  Cholecalciferol (VITAMIN D-3) 1000 UNITS CAPS, Take 1,000 Units by mouth daily. , Disp: , Rfl:  .  docusate sodium (COLACE) 100 MG capsule, Take 1 capsule (100 mg total) by mouth 2 (two) times daily as needed for constipation., Disp: , Rfl:  .  donepezil (ARICEPT) 10 MG tablet, Take 10 mg by mouth daily. , Disp: , Rfl:  .  fluticasone (FLONASE) 50 MCG/ACT nasal spray, Place 2 sprays into the nose daily as needed for allergies., Disp: , Rfl:  .  hydrALAZINE (APRESOLINE) 25 MG tablet, Take 1 tablet (25 mg total) by mouth 2 (two) times daily., Disp: , Rfl:  .  lansoprazole (PREVACID) 15 MG capsule, Take 1 capsule (15 mg total) by mouth daily., Disp: 90 capsule, Rfl: 1 .  levalbuterol (XOPENEX HFA) 45 MCG/ACT inhaler, Inhale 1-2 puffs into the lungs every 4 (four) hours as needed for wheezing or shortness of breath. , Disp: , Rfl:  .  magnesium hydroxide (MILK OF MAGNESIA) 400 MG/5ML suspension, Take 15 mLs by mouth daily as needed for constipation., Disp: , Rfl:  .  metFORMIN (GLUCOPHAGE) 1000 MG tablet, Take 1 tablet (1,000 mg total) by mouth 2 (two) times daily with a meal., Disp: 60 tablet, Rfl: 5 .  mirtazapine (REMERON) 15 MG tablet, Take 15 mg by mouth at bedtime., Disp: , Rfl:  .  polyethylene glycol (MIRALAX / GLYCOLAX) packet, Take 17 g by mouth daily as needed (constipation)., Disp: , Rfl:  .  tiotropium (SPIRIVA HANDIHALER) 18 MCG inhalation capsule, Place 1 capsule (18 mcg total) into inhaler and inhale daily., Disp: 30 capsule, Rfl: 5 .  vitamin B-12 (CYANOCOBALAMIN) 1000 MCG tablet, Take 1,000 mcg by mouth every morning. , Disp: , Rfl:       Objective:   Physical Exam  Filed Vitals:   07/13/14  1339  BP: 128/78  Pulse: 77  Height:  (1.702 m)  Weight: 73.483 kg (162 lb)  SpO2: 95%     Physical Exam GEN: A/Ox3; pleasant , NAD, elderly and chronically ill appearing   HEENT:  [place]/AT,  EACs-clear, TMs-wnl, NOSE-clear, THROAT-clear, no lesions, no postnasal drip or exudate noted.   NECK:  Supple w/ fair ROM; no JVD; normal carotid impulses w/o bruits; no thyromegaly or nodules palpated; no lymphadenopathy.  RESP  Diminshed BS in bases .no accessory muscle use, no dullness to percussion  CARD:  RRR, no m/r/g  , no peripheral edema, pulses intact, no cyanosis or clubbing.  GI:   Soft & nt; nml bowel sounds; no organomegaly or masses detected.  Musco: Warm bil, no deformities  or joint swelling noted.   Neuro: alert, no focal deficits noted.  Dementia - uses walker  Skin: Warm, no lesions or rashes          Assessment & Plan:     ICD-9-CM ICD-10-CM   1. Chronic obstructive pulmonary disease, unspecified COPD, unspecified chronic bronchitis type 496 J44.9   #COPD  use spiriva  per schedule  use xopenex mdi as neeeded; will do prior auth Glad uptodate with respiratory vaccines  #Followup 12 months COPD CAT score at followup REturn sooner if needed  Dr. Kalman ShanMurali Marchele Decock, M.D., [facility] Pulmonary and Critical Care Medicine Staff Physician [facility] [place] [facility] Pager: [phone], If no answer or between  15:00h - 7:00h: call [phone]  07/13/2014 1:56 PM

## 2014-07-13 NOTE — Patient Instructions (Addendum)
#  COPD  use spiriva  per schedule  use xopenex mdi as neeeded; will do prior auth Glad uptodate with respiratory vaccines  #Followup 12 months COPD CAT score at followup REturn sooner if needed

## 2014-07-21 ENCOUNTER — Telehealth: Payer: Self-pay | Admitting: Family Medicine

## 2014-07-21 NOTE — Telephone Encounter (Signed)
Please ok that verbal order and send what they need, thanks

## 2014-07-21 NOTE — Telephone Encounter (Signed)
Delice Bisonara @ [facility] called  Needed to verbal for nursing and physical therapy Need last office note faxed [phone] She also needs the last a1c faxed also

## 2014-07-25 NOTE — Telephone Encounter (Signed)
[facility] with [facility] at [facility].  She states Zella BallRobin has already given her the verbal order for Nursing and Physical Therapy.  She just needs the last office note and A1c faxed to [phone].  Office note and lab result faxed.

## 2014-07-26 DIAGNOSIS — Z7982 Long term (current) use of aspirin: Secondary | ICD-10-CM | POA: Diagnosis not present

## 2014-07-26 DIAGNOSIS — M25562 Pain in left knee: Secondary | ICD-10-CM | POA: Diagnosis not present

## 2014-07-26 DIAGNOSIS — F0390 Unspecified dementia without behavioral disturbance: Secondary | ICD-10-CM | POA: Diagnosis not present

## 2014-07-26 DIAGNOSIS — J449 Chronic obstructive pulmonary disease, unspecified: Secondary | ICD-10-CM | POA: Diagnosis not present

## 2014-07-26 DIAGNOSIS — M810 Age-related osteoporosis without current pathological fracture: Secondary | ICD-10-CM | POA: Diagnosis not present

## 2014-07-26 DIAGNOSIS — Z9181 History of falling: Secondary | ICD-10-CM | POA: Diagnosis not present

## 2014-07-26 DIAGNOSIS — E119 Type 2 diabetes mellitus without complications: Secondary | ICD-10-CM | POA: Diagnosis not present

## 2014-07-26 DIAGNOSIS — I5032 Chronic diastolic (congestive) heart failure: Secondary | ICD-10-CM | POA: Diagnosis not present

## 2014-07-26 DIAGNOSIS — F329 Major depressive disorder, single episode, unspecified: Secondary | ICD-10-CM | POA: Diagnosis not present

## 2014-07-26 DIAGNOSIS — M25561 Pain in right knee: Secondary | ICD-10-CM | POA: Diagnosis not present

## 2014-07-26 DIAGNOSIS — I480 Paroxysmal atrial fibrillation: Secondary | ICD-10-CM | POA: Diagnosis not present

## 2014-07-26 DIAGNOSIS — Z86711 Personal history of pulmonary embolism: Secondary | ICD-10-CM | POA: Diagnosis not present

## 2014-07-26 DIAGNOSIS — R260 Ataxic gait: Secondary | ICD-10-CM | POA: Diagnosis not present

## 2014-07-26 DIAGNOSIS — I10 Essential (primary) hypertension: Secondary | ICD-10-CM | POA: Diagnosis not present

## 2014-07-27 ENCOUNTER — Telehealth: Payer: Self-pay | Admitting: Family Medicine

## 2014-07-27 NOTE — Telephone Encounter (Signed)
Lyla Sonarrie [facility] left v/m requesting verbal orders for home health nursing services for pain mgt 2 x a week for 3 weeks; 1 x a week for 3 weeks. Please advise.

## 2014-07-27 NOTE — Telephone Encounter (Signed)
Please give verbal ok for that  Thanks

## 2014-07-27 NOTE — Telephone Encounter (Signed)
Earlene PlaterDavis who is Mrs Kuwahara's Physical Therapist just called and is needing a verbal order for the frequency on her PT. He said that it should be 3 week 2, 2 week 5 and 1 week 2. Please give him a call back @ [phone].

## 2014-07-27 NOTE — Telephone Encounter (Signed)
Left message to ok the verbal order for Dr. Milinda Antisower.

## 2014-07-27 NOTE — Telephone Encounter (Signed)
Spoken to Lyla Sonarrie, the [facility] with [facility]. Royden Purlower's comments.

## 2014-07-27 NOTE — Telephone Encounter (Signed)
Please ok that verbal order Thanks  

## 2014-07-28 DIAGNOSIS — E119 Type 2 diabetes mellitus without complications: Secondary | ICD-10-CM | POA: Diagnosis not present

## 2014-07-28 DIAGNOSIS — F0390 Unspecified dementia without behavioral disturbance: Secondary | ICD-10-CM | POA: Diagnosis not present

## 2014-07-28 DIAGNOSIS — R260 Ataxic gait: Secondary | ICD-10-CM | POA: Diagnosis not present

## 2014-07-28 DIAGNOSIS — Z7982 Long term (current) use of aspirin: Secondary | ICD-10-CM | POA: Diagnosis not present

## 2014-07-28 DIAGNOSIS — F329 Major depressive disorder, single episode, unspecified: Secondary | ICD-10-CM | POA: Diagnosis not present

## 2014-07-28 DIAGNOSIS — I10 Essential (primary) hypertension: Secondary | ICD-10-CM | POA: Diagnosis not present

## 2014-07-28 DIAGNOSIS — Z86711 Personal history of pulmonary embolism: Secondary | ICD-10-CM | POA: Diagnosis not present

## 2014-07-28 DIAGNOSIS — J449 Chronic obstructive pulmonary disease, unspecified: Secondary | ICD-10-CM | POA: Diagnosis not present

## 2014-07-28 DIAGNOSIS — M25561 Pain in right knee: Secondary | ICD-10-CM | POA: Diagnosis not present

## 2014-07-28 DIAGNOSIS — Z9181 History of falling: Secondary | ICD-10-CM | POA: Diagnosis not present

## 2014-07-28 DIAGNOSIS — M810 Age-related osteoporosis without current pathological fracture: Secondary | ICD-10-CM | POA: Diagnosis not present

## 2014-07-28 DIAGNOSIS — I480 Paroxysmal atrial fibrillation: Secondary | ICD-10-CM | POA: Diagnosis not present

## 2014-07-28 DIAGNOSIS — I5032 Chronic diastolic (congestive) heart failure: Secondary | ICD-10-CM | POA: Diagnosis not present

## 2014-07-28 DIAGNOSIS — M25562 Pain in left knee: Secondary | ICD-10-CM | POA: Diagnosis not present

## 2014-07-29 DIAGNOSIS — E119 Type 2 diabetes mellitus without complications: Secondary | ICD-10-CM | POA: Diagnosis not present

## 2014-07-29 DIAGNOSIS — Z86711 Personal history of pulmonary embolism: Secondary | ICD-10-CM | POA: Diagnosis not present

## 2014-07-29 DIAGNOSIS — F0390 Unspecified dementia without behavioral disturbance: Secondary | ICD-10-CM | POA: Diagnosis not present

## 2014-07-29 DIAGNOSIS — M810 Age-related osteoporosis without current pathological fracture: Secondary | ICD-10-CM | POA: Diagnosis not present

## 2014-07-29 DIAGNOSIS — M25561 Pain in right knee: Secondary | ICD-10-CM | POA: Diagnosis not present

## 2014-07-29 DIAGNOSIS — R260 Ataxic gait: Secondary | ICD-10-CM | POA: Diagnosis not present

## 2014-07-29 DIAGNOSIS — Z7982 Long term (current) use of aspirin: Secondary | ICD-10-CM | POA: Diagnosis not present

## 2014-07-29 DIAGNOSIS — I10 Essential (primary) hypertension: Secondary | ICD-10-CM | POA: Diagnosis not present

## 2014-07-29 DIAGNOSIS — F329 Major depressive disorder, single episode, unspecified: Secondary | ICD-10-CM | POA: Diagnosis not present

## 2014-07-29 DIAGNOSIS — Z9181 History of falling: Secondary | ICD-10-CM | POA: Diagnosis not present

## 2014-07-29 DIAGNOSIS — J449 Chronic obstructive pulmonary disease, unspecified: Secondary | ICD-10-CM | POA: Diagnosis not present

## 2014-07-29 DIAGNOSIS — I480 Paroxysmal atrial fibrillation: Secondary | ICD-10-CM | POA: Diagnosis not present

## 2014-07-29 DIAGNOSIS — M25562 Pain in left knee: Secondary | ICD-10-CM | POA: Diagnosis not present

## 2014-07-29 DIAGNOSIS — I5032 Chronic diastolic (congestive) heart failure: Secondary | ICD-10-CM | POA: Diagnosis not present

## 2014-08-01 DIAGNOSIS — I5032 Chronic diastolic (congestive) heart failure: Secondary | ICD-10-CM | POA: Diagnosis not present

## 2014-08-01 DIAGNOSIS — M25562 Pain in left knee: Secondary | ICD-10-CM | POA: Diagnosis not present

## 2014-08-01 DIAGNOSIS — F0390 Unspecified dementia without behavioral disturbance: Secondary | ICD-10-CM | POA: Diagnosis not present

## 2014-08-01 DIAGNOSIS — J449 Chronic obstructive pulmonary disease, unspecified: Secondary | ICD-10-CM | POA: Diagnosis not present

## 2014-08-01 DIAGNOSIS — I10 Essential (primary) hypertension: Secondary | ICD-10-CM | POA: Diagnosis not present

## 2014-08-01 DIAGNOSIS — Z86711 Personal history of pulmonary embolism: Secondary | ICD-10-CM | POA: Diagnosis not present

## 2014-08-01 DIAGNOSIS — E119 Type 2 diabetes mellitus without complications: Secondary | ICD-10-CM | POA: Diagnosis not present

## 2014-08-01 DIAGNOSIS — M25561 Pain in right knee: Secondary | ICD-10-CM | POA: Diagnosis not present

## 2014-08-01 DIAGNOSIS — Z7982 Long term (current) use of aspirin: Secondary | ICD-10-CM | POA: Diagnosis not present

## 2014-08-01 DIAGNOSIS — I480 Paroxysmal atrial fibrillation: Secondary | ICD-10-CM | POA: Diagnosis not present

## 2014-08-01 DIAGNOSIS — M810 Age-related osteoporosis without current pathological fracture: Secondary | ICD-10-CM | POA: Diagnosis not present

## 2014-08-01 DIAGNOSIS — F329 Major depressive disorder, single episode, unspecified: Secondary | ICD-10-CM | POA: Diagnosis not present

## 2014-08-01 DIAGNOSIS — Z9181 History of falling: Secondary | ICD-10-CM | POA: Diagnosis not present

## 2014-08-01 DIAGNOSIS — R260 Ataxic gait: Secondary | ICD-10-CM | POA: Diagnosis not present

## 2014-08-03 DIAGNOSIS — Z9181 History of falling: Secondary | ICD-10-CM | POA: Diagnosis not present

## 2014-08-03 DIAGNOSIS — Z7982 Long term (current) use of aspirin: Secondary | ICD-10-CM | POA: Diagnosis not present

## 2014-08-03 DIAGNOSIS — E119 Type 2 diabetes mellitus without complications: Secondary | ICD-10-CM | POA: Diagnosis not present

## 2014-08-03 DIAGNOSIS — M25562 Pain in left knee: Secondary | ICD-10-CM | POA: Diagnosis not present

## 2014-08-03 DIAGNOSIS — I480 Paroxysmal atrial fibrillation: Secondary | ICD-10-CM | POA: Diagnosis not present

## 2014-08-03 DIAGNOSIS — M810 Age-related osteoporosis without current pathological fracture: Secondary | ICD-10-CM | POA: Diagnosis not present

## 2014-08-03 DIAGNOSIS — F0390 Unspecified dementia without behavioral disturbance: Secondary | ICD-10-CM | POA: Diagnosis not present

## 2014-08-03 DIAGNOSIS — M25561 Pain in right knee: Secondary | ICD-10-CM | POA: Diagnosis not present

## 2014-08-03 DIAGNOSIS — Z86711 Personal history of pulmonary embolism: Secondary | ICD-10-CM | POA: Diagnosis not present

## 2014-08-03 DIAGNOSIS — J449 Chronic obstructive pulmonary disease, unspecified: Secondary | ICD-10-CM | POA: Diagnosis not present

## 2014-08-03 DIAGNOSIS — F329 Major depressive disorder, single episode, unspecified: Secondary | ICD-10-CM | POA: Diagnosis not present

## 2014-08-03 DIAGNOSIS — I10 Essential (primary) hypertension: Secondary | ICD-10-CM | POA: Diagnosis not present

## 2014-08-03 DIAGNOSIS — R260 Ataxic gait: Secondary | ICD-10-CM | POA: Diagnosis not present

## 2014-08-03 DIAGNOSIS — I5032 Chronic diastolic (congestive) heart failure: Secondary | ICD-10-CM | POA: Diagnosis not present

## 2014-08-04 DIAGNOSIS — F0390 Unspecified dementia without behavioral disturbance: Secondary | ICD-10-CM | POA: Diagnosis not present

## 2014-08-04 DIAGNOSIS — M810 Age-related osteoporosis without current pathological fracture: Secondary | ICD-10-CM | POA: Diagnosis not present

## 2014-08-04 DIAGNOSIS — Z9181 History of falling: Secondary | ICD-10-CM | POA: Diagnosis not present

## 2014-08-04 DIAGNOSIS — M25561 Pain in right knee: Secondary | ICD-10-CM | POA: Diagnosis not present

## 2014-08-04 DIAGNOSIS — J449 Chronic obstructive pulmonary disease, unspecified: Secondary | ICD-10-CM | POA: Diagnosis not present

## 2014-08-04 DIAGNOSIS — F329 Major depressive disorder, single episode, unspecified: Secondary | ICD-10-CM | POA: Diagnosis not present

## 2014-08-04 DIAGNOSIS — Z86711 Personal history of pulmonary embolism: Secondary | ICD-10-CM | POA: Diagnosis not present

## 2014-08-04 DIAGNOSIS — I480 Paroxysmal atrial fibrillation: Secondary | ICD-10-CM | POA: Diagnosis not present

## 2014-08-04 DIAGNOSIS — I5032 Chronic diastolic (congestive) heart failure: Secondary | ICD-10-CM | POA: Diagnosis not present

## 2014-08-04 DIAGNOSIS — R260 Ataxic gait: Secondary | ICD-10-CM | POA: Diagnosis not present

## 2014-08-04 DIAGNOSIS — Z7982 Long term (current) use of aspirin: Secondary | ICD-10-CM | POA: Diagnosis not present

## 2014-08-04 DIAGNOSIS — E119 Type 2 diabetes mellitus without complications: Secondary | ICD-10-CM | POA: Diagnosis not present

## 2014-08-04 DIAGNOSIS — I10 Essential (primary) hypertension: Secondary | ICD-10-CM | POA: Diagnosis not present

## 2014-08-04 DIAGNOSIS — M25562 Pain in left knee: Secondary | ICD-10-CM | POA: Diagnosis not present

## 2014-08-05 DIAGNOSIS — Z86711 Personal history of pulmonary embolism: Secondary | ICD-10-CM | POA: Diagnosis not present

## 2014-08-05 DIAGNOSIS — I10 Essential (primary) hypertension: Secondary | ICD-10-CM | POA: Diagnosis not present

## 2014-08-05 DIAGNOSIS — J449 Chronic obstructive pulmonary disease, unspecified: Secondary | ICD-10-CM | POA: Diagnosis not present

## 2014-08-05 DIAGNOSIS — R260 Ataxic gait: Secondary | ICD-10-CM | POA: Diagnosis not present

## 2014-08-05 DIAGNOSIS — Z7982 Long term (current) use of aspirin: Secondary | ICD-10-CM | POA: Diagnosis not present

## 2014-08-05 DIAGNOSIS — E119 Type 2 diabetes mellitus without complications: Secondary | ICD-10-CM | POA: Diagnosis not present

## 2014-08-05 DIAGNOSIS — M810 Age-related osteoporosis without current pathological fracture: Secondary | ICD-10-CM | POA: Diagnosis not present

## 2014-08-05 DIAGNOSIS — I480 Paroxysmal atrial fibrillation: Secondary | ICD-10-CM | POA: Diagnosis not present

## 2014-08-05 DIAGNOSIS — M25561 Pain in right knee: Secondary | ICD-10-CM | POA: Diagnosis not present

## 2014-08-05 DIAGNOSIS — Z9181 History of falling: Secondary | ICD-10-CM | POA: Diagnosis not present

## 2014-08-05 DIAGNOSIS — F0390 Unspecified dementia without behavioral disturbance: Secondary | ICD-10-CM | POA: Diagnosis not present

## 2014-08-05 DIAGNOSIS — M25562 Pain in left knee: Secondary | ICD-10-CM | POA: Diagnosis not present

## 2014-08-05 DIAGNOSIS — I5032 Chronic diastolic (congestive) heart failure: Secondary | ICD-10-CM | POA: Diagnosis not present

## 2014-08-05 DIAGNOSIS — F329 Major depressive disorder, single episode, unspecified: Secondary | ICD-10-CM | POA: Diagnosis not present

## 2014-08-09 DIAGNOSIS — M25561 Pain in right knee: Secondary | ICD-10-CM

## 2014-08-09 DIAGNOSIS — I480 Paroxysmal atrial fibrillation: Secondary | ICD-10-CM | POA: Diagnosis not present

## 2014-08-09 DIAGNOSIS — M25562 Pain in left knee: Secondary | ICD-10-CM

## 2014-08-09 DIAGNOSIS — Z7982 Long term (current) use of aspirin: Secondary | ICD-10-CM | POA: Diagnosis not present

## 2014-08-09 DIAGNOSIS — Z86711 Personal history of pulmonary embolism: Secondary | ICD-10-CM | POA: Diagnosis not present

## 2014-08-09 DIAGNOSIS — F329 Major depressive disorder, single episode, unspecified: Secondary | ICD-10-CM | POA: Diagnosis not present

## 2014-08-09 DIAGNOSIS — M810 Age-related osteoporosis without current pathological fracture: Secondary | ICD-10-CM | POA: Diagnosis not present

## 2014-08-09 DIAGNOSIS — Z9181 History of falling: Secondary | ICD-10-CM | POA: Diagnosis not present

## 2014-08-09 DIAGNOSIS — R260 Ataxic gait: Secondary | ICD-10-CM | POA: Diagnosis not present

## 2014-08-09 DIAGNOSIS — E119 Type 2 diabetes mellitus without complications: Secondary | ICD-10-CM | POA: Diagnosis not present

## 2014-08-09 DIAGNOSIS — J449 Chronic obstructive pulmonary disease, unspecified: Secondary | ICD-10-CM | POA: Diagnosis not present

## 2014-08-09 DIAGNOSIS — I5032 Chronic diastolic (congestive) heart failure: Secondary | ICD-10-CM | POA: Diagnosis not present

## 2014-08-09 DIAGNOSIS — F0390 Unspecified dementia without behavioral disturbance: Secondary | ICD-10-CM | POA: Diagnosis not present

## 2014-08-09 DIAGNOSIS — I10 Essential (primary) hypertension: Secondary | ICD-10-CM

## 2014-08-10 DIAGNOSIS — I10 Essential (primary) hypertension: Secondary | ICD-10-CM | POA: Diagnosis not present

## 2014-08-10 DIAGNOSIS — J449 Chronic obstructive pulmonary disease, unspecified: Secondary | ICD-10-CM | POA: Diagnosis not present

## 2014-08-10 DIAGNOSIS — I5032 Chronic diastolic (congestive) heart failure: Secondary | ICD-10-CM | POA: Diagnosis not present

## 2014-08-10 DIAGNOSIS — M25561 Pain in right knee: Secondary | ICD-10-CM | POA: Diagnosis not present

## 2014-08-10 DIAGNOSIS — Z86711 Personal history of pulmonary embolism: Secondary | ICD-10-CM | POA: Diagnosis not present

## 2014-08-10 DIAGNOSIS — Z7982 Long term (current) use of aspirin: Secondary | ICD-10-CM | POA: Diagnosis not present

## 2014-08-10 DIAGNOSIS — E119 Type 2 diabetes mellitus without complications: Secondary | ICD-10-CM | POA: Diagnosis not present

## 2014-08-10 DIAGNOSIS — F329 Major depressive disorder, single episode, unspecified: Secondary | ICD-10-CM | POA: Diagnosis not present

## 2014-08-10 DIAGNOSIS — R260 Ataxic gait: Secondary | ICD-10-CM | POA: Diagnosis not present

## 2014-08-10 DIAGNOSIS — M25562 Pain in left knee: Secondary | ICD-10-CM | POA: Diagnosis not present

## 2014-08-10 DIAGNOSIS — Z9181 History of falling: Secondary | ICD-10-CM | POA: Diagnosis not present

## 2014-08-10 DIAGNOSIS — F0390 Unspecified dementia without behavioral disturbance: Secondary | ICD-10-CM | POA: Diagnosis not present

## 2014-08-10 DIAGNOSIS — M810 Age-related osteoporosis without current pathological fracture: Secondary | ICD-10-CM | POA: Diagnosis not present

## 2014-08-10 DIAGNOSIS — I480 Paroxysmal atrial fibrillation: Secondary | ICD-10-CM | POA: Diagnosis not present

## 2014-08-11 DIAGNOSIS — F329 Major depressive disorder, single episode, unspecified: Secondary | ICD-10-CM | POA: Diagnosis not present

## 2014-08-11 DIAGNOSIS — Z86711 Personal history of pulmonary embolism: Secondary | ICD-10-CM | POA: Diagnosis not present

## 2014-08-11 DIAGNOSIS — M25562 Pain in left knee: Secondary | ICD-10-CM | POA: Diagnosis not present

## 2014-08-11 DIAGNOSIS — F0390 Unspecified dementia without behavioral disturbance: Secondary | ICD-10-CM | POA: Diagnosis not present

## 2014-08-11 DIAGNOSIS — I10 Essential (primary) hypertension: Secondary | ICD-10-CM | POA: Diagnosis not present

## 2014-08-11 DIAGNOSIS — J449 Chronic obstructive pulmonary disease, unspecified: Secondary | ICD-10-CM | POA: Diagnosis not present

## 2014-08-11 DIAGNOSIS — E119 Type 2 diabetes mellitus without complications: Secondary | ICD-10-CM | POA: Diagnosis not present

## 2014-08-11 DIAGNOSIS — M25561 Pain in right knee: Secondary | ICD-10-CM | POA: Diagnosis not present

## 2014-08-11 DIAGNOSIS — I480 Paroxysmal atrial fibrillation: Secondary | ICD-10-CM | POA: Diagnosis not present

## 2014-08-11 DIAGNOSIS — Z9181 History of falling: Secondary | ICD-10-CM | POA: Diagnosis not present

## 2014-08-11 DIAGNOSIS — M810 Age-related osteoporosis without current pathological fracture: Secondary | ICD-10-CM | POA: Diagnosis not present

## 2014-08-11 DIAGNOSIS — I5032 Chronic diastolic (congestive) heart failure: Secondary | ICD-10-CM | POA: Diagnosis not present

## 2014-08-11 DIAGNOSIS — R260 Ataxic gait: Secondary | ICD-10-CM | POA: Diagnosis not present

## 2014-08-11 DIAGNOSIS — Z7982 Long term (current) use of aspirin: Secondary | ICD-10-CM | POA: Diagnosis not present

## 2014-08-12 DIAGNOSIS — J449 Chronic obstructive pulmonary disease, unspecified: Secondary | ICD-10-CM | POA: Diagnosis not present

## 2014-08-12 DIAGNOSIS — M25561 Pain in right knee: Secondary | ICD-10-CM | POA: Diagnosis not present

## 2014-08-12 DIAGNOSIS — M25562 Pain in left knee: Secondary | ICD-10-CM | POA: Diagnosis not present

## 2014-08-12 DIAGNOSIS — F329 Major depressive disorder, single episode, unspecified: Secondary | ICD-10-CM | POA: Diagnosis not present

## 2014-08-12 DIAGNOSIS — Z9181 History of falling: Secondary | ICD-10-CM | POA: Diagnosis not present

## 2014-08-12 DIAGNOSIS — I10 Essential (primary) hypertension: Secondary | ICD-10-CM | POA: Diagnosis not present

## 2014-08-12 DIAGNOSIS — I5032 Chronic diastolic (congestive) heart failure: Secondary | ICD-10-CM | POA: Diagnosis not present

## 2014-08-12 DIAGNOSIS — R260 Ataxic gait: Secondary | ICD-10-CM | POA: Diagnosis not present

## 2014-08-12 DIAGNOSIS — Z86711 Personal history of pulmonary embolism: Secondary | ICD-10-CM | POA: Diagnosis not present

## 2014-08-12 DIAGNOSIS — I480 Paroxysmal atrial fibrillation: Secondary | ICD-10-CM | POA: Diagnosis not present

## 2014-08-12 DIAGNOSIS — M810 Age-related osteoporosis without current pathological fracture: Secondary | ICD-10-CM | POA: Diagnosis not present

## 2014-08-12 DIAGNOSIS — F0390 Unspecified dementia without behavioral disturbance: Secondary | ICD-10-CM | POA: Diagnosis not present

## 2014-08-12 DIAGNOSIS — E119 Type 2 diabetes mellitus without complications: Secondary | ICD-10-CM | POA: Diagnosis not present

## 2014-08-12 DIAGNOSIS — Z7982 Long term (current) use of aspirin: Secondary | ICD-10-CM | POA: Diagnosis not present

## 2014-08-15 DIAGNOSIS — Z86711 Personal history of pulmonary embolism: Secondary | ICD-10-CM | POA: Diagnosis not present

## 2014-08-15 DIAGNOSIS — F329 Major depressive disorder, single episode, unspecified: Secondary | ICD-10-CM | POA: Diagnosis not present

## 2014-08-15 DIAGNOSIS — Z9181 History of falling: Secondary | ICD-10-CM | POA: Diagnosis not present

## 2014-08-15 DIAGNOSIS — I10 Essential (primary) hypertension: Secondary | ICD-10-CM | POA: Diagnosis not present

## 2014-08-15 DIAGNOSIS — M810 Age-related osteoporosis without current pathological fracture: Secondary | ICD-10-CM | POA: Diagnosis not present

## 2014-08-15 DIAGNOSIS — E119 Type 2 diabetes mellitus without complications: Secondary | ICD-10-CM | POA: Diagnosis not present

## 2014-08-15 DIAGNOSIS — Z7982 Long term (current) use of aspirin: Secondary | ICD-10-CM | POA: Diagnosis not present

## 2014-08-15 DIAGNOSIS — I5032 Chronic diastolic (congestive) heart failure: Secondary | ICD-10-CM | POA: Diagnosis not present

## 2014-08-15 DIAGNOSIS — J449 Chronic obstructive pulmonary disease, unspecified: Secondary | ICD-10-CM | POA: Diagnosis not present

## 2014-08-15 DIAGNOSIS — M25561 Pain in right knee: Secondary | ICD-10-CM | POA: Diagnosis not present

## 2014-08-15 DIAGNOSIS — I480 Paroxysmal atrial fibrillation: Secondary | ICD-10-CM | POA: Diagnosis not present

## 2014-08-15 DIAGNOSIS — R260 Ataxic gait: Secondary | ICD-10-CM | POA: Diagnosis not present

## 2014-08-15 DIAGNOSIS — M25562 Pain in left knee: Secondary | ICD-10-CM | POA: Diagnosis not present

## 2014-08-15 DIAGNOSIS — F0390 Unspecified dementia without behavioral disturbance: Secondary | ICD-10-CM | POA: Diagnosis not present

## 2014-08-17 DIAGNOSIS — R260 Ataxic gait: Secondary | ICD-10-CM | POA: Diagnosis not present

## 2014-08-17 DIAGNOSIS — F329 Major depressive disorder, single episode, unspecified: Secondary | ICD-10-CM | POA: Diagnosis not present

## 2014-08-17 DIAGNOSIS — Z86711 Personal history of pulmonary embolism: Secondary | ICD-10-CM | POA: Diagnosis not present

## 2014-08-17 DIAGNOSIS — M810 Age-related osteoporosis without current pathological fracture: Secondary | ICD-10-CM | POA: Diagnosis not present

## 2014-08-17 DIAGNOSIS — Z7982 Long term (current) use of aspirin: Secondary | ICD-10-CM | POA: Diagnosis not present

## 2014-08-17 DIAGNOSIS — I5032 Chronic diastolic (congestive) heart failure: Secondary | ICD-10-CM | POA: Diagnosis not present

## 2014-08-17 DIAGNOSIS — I480 Paroxysmal atrial fibrillation: Secondary | ICD-10-CM | POA: Diagnosis not present

## 2014-08-17 DIAGNOSIS — E119 Type 2 diabetes mellitus without complications: Secondary | ICD-10-CM | POA: Diagnosis not present

## 2014-08-17 DIAGNOSIS — I10 Essential (primary) hypertension: Secondary | ICD-10-CM | POA: Diagnosis not present

## 2014-08-17 DIAGNOSIS — J449 Chronic obstructive pulmonary disease, unspecified: Secondary | ICD-10-CM | POA: Diagnosis not present

## 2014-08-17 DIAGNOSIS — M25562 Pain in left knee: Secondary | ICD-10-CM | POA: Diagnosis not present

## 2014-08-17 DIAGNOSIS — F0390 Unspecified dementia without behavioral disturbance: Secondary | ICD-10-CM | POA: Diagnosis not present

## 2014-08-17 DIAGNOSIS — M25561 Pain in right knee: Secondary | ICD-10-CM | POA: Diagnosis not present

## 2014-08-17 DIAGNOSIS — Z9181 History of falling: Secondary | ICD-10-CM | POA: Diagnosis not present

## 2014-08-18 DIAGNOSIS — F329 Major depressive disorder, single episode, unspecified: Secondary | ICD-10-CM | POA: Diagnosis not present

## 2014-08-18 DIAGNOSIS — I5032 Chronic diastolic (congestive) heart failure: Secondary | ICD-10-CM | POA: Diagnosis not present

## 2014-08-18 DIAGNOSIS — E119 Type 2 diabetes mellitus without complications: Secondary | ICD-10-CM | POA: Diagnosis not present

## 2014-08-18 DIAGNOSIS — Z86711 Personal history of pulmonary embolism: Secondary | ICD-10-CM | POA: Diagnosis not present

## 2014-08-18 DIAGNOSIS — J449 Chronic obstructive pulmonary disease, unspecified: Secondary | ICD-10-CM | POA: Diagnosis not present

## 2014-08-18 DIAGNOSIS — F0390 Unspecified dementia without behavioral disturbance: Secondary | ICD-10-CM | POA: Diagnosis not present

## 2014-08-18 DIAGNOSIS — I480 Paroxysmal atrial fibrillation: Secondary | ICD-10-CM | POA: Diagnosis not present

## 2014-08-18 DIAGNOSIS — M25562 Pain in left knee: Secondary | ICD-10-CM | POA: Diagnosis not present

## 2014-08-18 DIAGNOSIS — M25561 Pain in right knee: Secondary | ICD-10-CM | POA: Diagnosis not present

## 2014-08-18 DIAGNOSIS — Z7982 Long term (current) use of aspirin: Secondary | ICD-10-CM | POA: Diagnosis not present

## 2014-08-18 DIAGNOSIS — M810 Age-related osteoporosis without current pathological fracture: Secondary | ICD-10-CM | POA: Diagnosis not present

## 2014-08-18 DIAGNOSIS — R260 Ataxic gait: Secondary | ICD-10-CM | POA: Diagnosis not present

## 2014-08-18 DIAGNOSIS — I10 Essential (primary) hypertension: Secondary | ICD-10-CM | POA: Diagnosis not present

## 2014-08-18 DIAGNOSIS — Z9181 History of falling: Secondary | ICD-10-CM | POA: Diagnosis not present

## 2014-08-22 ENCOUNTER — Telehealth: Payer: Self-pay | Admitting: Family Medicine

## 2014-08-22 DIAGNOSIS — M810 Age-related osteoporosis without current pathological fracture: Secondary | ICD-10-CM | POA: Diagnosis not present

## 2014-08-22 DIAGNOSIS — M25561 Pain in right knee: Secondary | ICD-10-CM | POA: Diagnosis not present

## 2014-08-22 DIAGNOSIS — I480 Paroxysmal atrial fibrillation: Secondary | ICD-10-CM | POA: Diagnosis not present

## 2014-08-22 DIAGNOSIS — F0390 Unspecified dementia without behavioral disturbance: Secondary | ICD-10-CM | POA: Diagnosis not present

## 2014-08-22 DIAGNOSIS — Z86711 Personal history of pulmonary embolism: Secondary | ICD-10-CM | POA: Diagnosis not present

## 2014-08-22 DIAGNOSIS — E119 Type 2 diabetes mellitus without complications: Secondary | ICD-10-CM | POA: Diagnosis not present

## 2014-08-22 DIAGNOSIS — F329 Major depressive disorder, single episode, unspecified: Secondary | ICD-10-CM | POA: Diagnosis not present

## 2014-08-22 DIAGNOSIS — J449 Chronic obstructive pulmonary disease, unspecified: Secondary | ICD-10-CM | POA: Diagnosis not present

## 2014-08-22 DIAGNOSIS — Z7982 Long term (current) use of aspirin: Secondary | ICD-10-CM | POA: Diagnosis not present

## 2014-08-22 DIAGNOSIS — Z9181 History of falling: Secondary | ICD-10-CM | POA: Diagnosis not present

## 2014-08-22 DIAGNOSIS — R260 Ataxic gait: Secondary | ICD-10-CM | POA: Diagnosis not present

## 2014-08-22 DIAGNOSIS — I10 Essential (primary) hypertension: Secondary | ICD-10-CM | POA: Diagnosis not present

## 2014-08-22 DIAGNOSIS — M25562 Pain in left knee: Secondary | ICD-10-CM | POA: Diagnosis not present

## 2014-08-22 DIAGNOSIS — I5032 Chronic diastolic (congestive) heart failure: Secondary | ICD-10-CM | POA: Diagnosis not present

## 2014-08-22 NOTE — Telephone Encounter (Signed)
[facility] Dandy[facility] bridge of [facility] dropped off orders for pt that need to be signed.  Please call Camello at [phone] when ready to be picked up.  Placing on Shapale's desk

## 2014-08-22 NOTE — Telephone Encounter (Signed)
Place orders on Dr Royden Purlower's inbox.

## 2014-08-23 NOTE — Telephone Encounter (Signed)
Signed and in IN box 

## 2014-08-23 NOTE — Telephone Encounter (Signed)
Called [name] and notified her that the orders are done and ready for pick up. Left in the front office.

## 2014-08-24 DIAGNOSIS — M810 Age-related osteoporosis without current pathological fracture: Secondary | ICD-10-CM | POA: Diagnosis not present

## 2014-08-24 DIAGNOSIS — M25562 Pain in left knee: Secondary | ICD-10-CM | POA: Diagnosis not present

## 2014-08-24 DIAGNOSIS — F329 Major depressive disorder, single episode, unspecified: Secondary | ICD-10-CM | POA: Diagnosis not present

## 2014-08-24 DIAGNOSIS — Z9181 History of falling: Secondary | ICD-10-CM | POA: Diagnosis not present

## 2014-08-24 DIAGNOSIS — F0390 Unspecified dementia without behavioral disturbance: Secondary | ICD-10-CM | POA: Diagnosis not present

## 2014-08-24 DIAGNOSIS — I5032 Chronic diastolic (congestive) heart failure: Secondary | ICD-10-CM | POA: Diagnosis not present

## 2014-08-24 DIAGNOSIS — J449 Chronic obstructive pulmonary disease, unspecified: Secondary | ICD-10-CM | POA: Diagnosis not present

## 2014-08-24 DIAGNOSIS — E119 Type 2 diabetes mellitus without complications: Secondary | ICD-10-CM | POA: Diagnosis not present

## 2014-08-24 DIAGNOSIS — I10 Essential (primary) hypertension: Secondary | ICD-10-CM | POA: Diagnosis not present

## 2014-08-24 DIAGNOSIS — I480 Paroxysmal atrial fibrillation: Secondary | ICD-10-CM | POA: Diagnosis not present

## 2014-08-24 DIAGNOSIS — Z86711 Personal history of pulmonary embolism: Secondary | ICD-10-CM | POA: Diagnosis not present

## 2014-08-24 DIAGNOSIS — Z7982 Long term (current) use of aspirin: Secondary | ICD-10-CM | POA: Diagnosis not present

## 2014-08-24 DIAGNOSIS — R260 Ataxic gait: Secondary | ICD-10-CM | POA: Diagnosis not present

## 2014-08-24 DIAGNOSIS — M25561 Pain in right knee: Secondary | ICD-10-CM | POA: Diagnosis not present

## 2014-08-25 DIAGNOSIS — I480 Paroxysmal atrial fibrillation: Secondary | ICD-10-CM | POA: Diagnosis not present

## 2014-08-25 DIAGNOSIS — Z9181 History of falling: Secondary | ICD-10-CM | POA: Diagnosis not present

## 2014-08-25 DIAGNOSIS — M810 Age-related osteoporosis without current pathological fracture: Secondary | ICD-10-CM | POA: Diagnosis not present

## 2014-08-25 DIAGNOSIS — M25561 Pain in right knee: Secondary | ICD-10-CM | POA: Diagnosis not present

## 2014-08-25 DIAGNOSIS — R260 Ataxic gait: Secondary | ICD-10-CM | POA: Diagnosis not present

## 2014-08-25 DIAGNOSIS — I10 Essential (primary) hypertension: Secondary | ICD-10-CM | POA: Diagnosis not present

## 2014-08-25 DIAGNOSIS — F0390 Unspecified dementia without behavioral disturbance: Secondary | ICD-10-CM | POA: Diagnosis not present

## 2014-08-25 DIAGNOSIS — E119 Type 2 diabetes mellitus without complications: Secondary | ICD-10-CM | POA: Diagnosis not present

## 2014-08-25 DIAGNOSIS — M25562 Pain in left knee: Secondary | ICD-10-CM | POA: Diagnosis not present

## 2014-08-25 DIAGNOSIS — J449 Chronic obstructive pulmonary disease, unspecified: Secondary | ICD-10-CM | POA: Diagnosis not present

## 2014-08-25 DIAGNOSIS — F329 Major depressive disorder, single episode, unspecified: Secondary | ICD-10-CM | POA: Diagnosis not present

## 2014-08-25 DIAGNOSIS — I5032 Chronic diastolic (congestive) heart failure: Secondary | ICD-10-CM | POA: Diagnosis not present

## 2014-08-25 DIAGNOSIS — Z7982 Long term (current) use of aspirin: Secondary | ICD-10-CM | POA: Diagnosis not present

## 2014-08-25 DIAGNOSIS — Z86711 Personal history of pulmonary embolism: Secondary | ICD-10-CM | POA: Diagnosis not present

## 2014-08-29 DIAGNOSIS — M25561 Pain in right knee: Secondary | ICD-10-CM | POA: Diagnosis not present

## 2014-08-29 DIAGNOSIS — I480 Paroxysmal atrial fibrillation: Secondary | ICD-10-CM | POA: Diagnosis not present

## 2014-08-29 DIAGNOSIS — J449 Chronic obstructive pulmonary disease, unspecified: Secondary | ICD-10-CM | POA: Diagnosis not present

## 2014-08-29 DIAGNOSIS — Z7982 Long term (current) use of aspirin: Secondary | ICD-10-CM | POA: Diagnosis not present

## 2014-08-29 DIAGNOSIS — M810 Age-related osteoporosis without current pathological fracture: Secondary | ICD-10-CM | POA: Diagnosis not present

## 2014-08-29 DIAGNOSIS — F329 Major depressive disorder, single episode, unspecified: Secondary | ICD-10-CM | POA: Diagnosis not present

## 2014-08-29 DIAGNOSIS — I5032 Chronic diastolic (congestive) heart failure: Secondary | ICD-10-CM | POA: Diagnosis not present

## 2014-08-29 DIAGNOSIS — I10 Essential (primary) hypertension: Secondary | ICD-10-CM | POA: Diagnosis not present

## 2014-08-29 DIAGNOSIS — Z9181 History of falling: Secondary | ICD-10-CM | POA: Diagnosis not present

## 2014-08-29 DIAGNOSIS — R260 Ataxic gait: Secondary | ICD-10-CM | POA: Diagnosis not present

## 2014-08-29 DIAGNOSIS — E119 Type 2 diabetes mellitus without complications: Secondary | ICD-10-CM | POA: Diagnosis not present

## 2014-08-29 DIAGNOSIS — F0390 Unspecified dementia without behavioral disturbance: Secondary | ICD-10-CM | POA: Diagnosis not present

## 2014-08-29 DIAGNOSIS — Z86711 Personal history of pulmonary embolism: Secondary | ICD-10-CM | POA: Diagnosis not present

## 2014-08-29 DIAGNOSIS — M25562 Pain in left knee: Secondary | ICD-10-CM | POA: Diagnosis not present

## 2014-08-31 ENCOUNTER — Other Ambulatory Visit: Payer: Medicare Other

## 2014-08-31 DIAGNOSIS — I480 Paroxysmal atrial fibrillation: Secondary | ICD-10-CM | POA: Diagnosis not present

## 2014-08-31 DIAGNOSIS — M25561 Pain in right knee: Secondary | ICD-10-CM | POA: Diagnosis not present

## 2014-08-31 DIAGNOSIS — E119 Type 2 diabetes mellitus without complications: Secondary | ICD-10-CM | POA: Diagnosis not present

## 2014-08-31 DIAGNOSIS — R260 Ataxic gait: Secondary | ICD-10-CM | POA: Diagnosis not present

## 2014-08-31 DIAGNOSIS — Z9181 History of falling: Secondary | ICD-10-CM | POA: Diagnosis not present

## 2014-08-31 DIAGNOSIS — J449 Chronic obstructive pulmonary disease, unspecified: Secondary | ICD-10-CM | POA: Diagnosis not present

## 2014-08-31 DIAGNOSIS — F329 Major depressive disorder, single episode, unspecified: Secondary | ICD-10-CM | POA: Diagnosis not present

## 2014-08-31 DIAGNOSIS — F0390 Unspecified dementia without behavioral disturbance: Secondary | ICD-10-CM | POA: Diagnosis not present

## 2014-08-31 DIAGNOSIS — Z86711 Personal history of pulmonary embolism: Secondary | ICD-10-CM | POA: Diagnosis not present

## 2014-08-31 DIAGNOSIS — I5032 Chronic diastolic (congestive) heart failure: Secondary | ICD-10-CM | POA: Diagnosis not present

## 2014-08-31 DIAGNOSIS — I10 Essential (primary) hypertension: Secondary | ICD-10-CM | POA: Diagnosis not present

## 2014-08-31 DIAGNOSIS — M810 Age-related osteoporosis without current pathological fracture: Secondary | ICD-10-CM | POA: Diagnosis not present

## 2014-08-31 DIAGNOSIS — Z7982 Long term (current) use of aspirin: Secondary | ICD-10-CM | POA: Diagnosis not present

## 2014-08-31 DIAGNOSIS — M25562 Pain in left knee: Secondary | ICD-10-CM | POA: Diagnosis not present

## 2014-09-01 DIAGNOSIS — M810 Age-related osteoporosis without current pathological fracture: Secondary | ICD-10-CM | POA: Diagnosis not present

## 2014-09-01 DIAGNOSIS — Z86711 Personal history of pulmonary embolism: Secondary | ICD-10-CM | POA: Diagnosis not present

## 2014-09-01 DIAGNOSIS — J449 Chronic obstructive pulmonary disease, unspecified: Secondary | ICD-10-CM | POA: Diagnosis not present

## 2014-09-01 DIAGNOSIS — F0390 Unspecified dementia without behavioral disturbance: Secondary | ICD-10-CM | POA: Diagnosis not present

## 2014-09-01 DIAGNOSIS — M25562 Pain in left knee: Secondary | ICD-10-CM | POA: Diagnosis not present

## 2014-09-01 DIAGNOSIS — Z9181 History of falling: Secondary | ICD-10-CM | POA: Diagnosis not present

## 2014-09-01 DIAGNOSIS — I480 Paroxysmal atrial fibrillation: Secondary | ICD-10-CM | POA: Diagnosis not present

## 2014-09-01 DIAGNOSIS — M25561 Pain in right knee: Secondary | ICD-10-CM | POA: Diagnosis not present

## 2014-09-01 DIAGNOSIS — I10 Essential (primary) hypertension: Secondary | ICD-10-CM | POA: Diagnosis not present

## 2014-09-01 DIAGNOSIS — F329 Major depressive disorder, single episode, unspecified: Secondary | ICD-10-CM | POA: Diagnosis not present

## 2014-09-01 DIAGNOSIS — E119 Type 2 diabetes mellitus without complications: Secondary | ICD-10-CM | POA: Diagnosis not present

## 2014-09-01 DIAGNOSIS — R260 Ataxic gait: Secondary | ICD-10-CM | POA: Diagnosis not present

## 2014-09-01 DIAGNOSIS — I5032 Chronic diastolic (congestive) heart failure: Secondary | ICD-10-CM | POA: Diagnosis not present

## 2014-09-01 DIAGNOSIS — Z7982 Long term (current) use of aspirin: Secondary | ICD-10-CM | POA: Diagnosis not present

## 2014-09-05 DIAGNOSIS — I5032 Chronic diastolic (congestive) heart failure: Secondary | ICD-10-CM | POA: Diagnosis not present

## 2014-09-05 DIAGNOSIS — R260 Ataxic gait: Secondary | ICD-10-CM | POA: Diagnosis not present

## 2014-09-05 DIAGNOSIS — J449 Chronic obstructive pulmonary disease, unspecified: Secondary | ICD-10-CM | POA: Diagnosis not present

## 2014-09-05 DIAGNOSIS — E119 Type 2 diabetes mellitus without complications: Secondary | ICD-10-CM | POA: Diagnosis not present

## 2014-09-05 DIAGNOSIS — Z86711 Personal history of pulmonary embolism: Secondary | ICD-10-CM | POA: Diagnosis not present

## 2014-09-05 DIAGNOSIS — F0390 Unspecified dementia without behavioral disturbance: Secondary | ICD-10-CM | POA: Diagnosis not present

## 2014-09-05 DIAGNOSIS — M810 Age-related osteoporosis without current pathological fracture: Secondary | ICD-10-CM | POA: Diagnosis not present

## 2014-09-05 DIAGNOSIS — F329 Major depressive disorder, single episode, unspecified: Secondary | ICD-10-CM | POA: Diagnosis not present

## 2014-09-05 DIAGNOSIS — Z7982 Long term (current) use of aspirin: Secondary | ICD-10-CM | POA: Diagnosis not present

## 2014-09-05 DIAGNOSIS — M25561 Pain in right knee: Secondary | ICD-10-CM | POA: Diagnosis not present

## 2014-09-05 DIAGNOSIS — I10 Essential (primary) hypertension: Secondary | ICD-10-CM | POA: Diagnosis not present

## 2014-09-05 DIAGNOSIS — I480 Paroxysmal atrial fibrillation: Secondary | ICD-10-CM | POA: Diagnosis not present

## 2014-09-05 DIAGNOSIS — M25562 Pain in left knee: Secondary | ICD-10-CM | POA: Diagnosis not present

## 2014-09-05 DIAGNOSIS — Z9181 History of falling: Secondary | ICD-10-CM | POA: Diagnosis not present

## 2014-09-07 DIAGNOSIS — N390 Urinary tract infection, site not specified: Secondary | ICD-10-CM | POA: Diagnosis not present

## 2014-09-08 DIAGNOSIS — Z7982 Long term (current) use of aspirin: Secondary | ICD-10-CM | POA: Diagnosis not present

## 2014-09-08 DIAGNOSIS — I10 Essential (primary) hypertension: Secondary | ICD-10-CM | POA: Diagnosis not present

## 2014-09-08 DIAGNOSIS — M810 Age-related osteoporosis without current pathological fracture: Secondary | ICD-10-CM | POA: Diagnosis not present

## 2014-09-08 DIAGNOSIS — Z9181 History of falling: Secondary | ICD-10-CM | POA: Diagnosis not present

## 2014-09-08 DIAGNOSIS — F0390 Unspecified dementia without behavioral disturbance: Secondary | ICD-10-CM | POA: Diagnosis not present

## 2014-09-08 DIAGNOSIS — Z86711 Personal history of pulmonary embolism: Secondary | ICD-10-CM | POA: Diagnosis not present

## 2014-09-08 DIAGNOSIS — F329 Major depressive disorder, single episode, unspecified: Secondary | ICD-10-CM | POA: Diagnosis not present

## 2014-09-08 DIAGNOSIS — I5032 Chronic diastolic (congestive) heart failure: Secondary | ICD-10-CM | POA: Diagnosis not present

## 2014-09-08 DIAGNOSIS — I480 Paroxysmal atrial fibrillation: Secondary | ICD-10-CM | POA: Diagnosis not present

## 2014-09-08 DIAGNOSIS — E119 Type 2 diabetes mellitus without complications: Secondary | ICD-10-CM | POA: Diagnosis not present

## 2014-09-08 DIAGNOSIS — J449 Chronic obstructive pulmonary disease, unspecified: Secondary | ICD-10-CM | POA: Diagnosis not present

## 2014-09-08 DIAGNOSIS — R260 Ataxic gait: Secondary | ICD-10-CM | POA: Diagnosis not present

## 2014-09-08 DIAGNOSIS — M25561 Pain in right knee: Secondary | ICD-10-CM | POA: Diagnosis not present

## 2014-09-08 DIAGNOSIS — M25562 Pain in left knee: Secondary | ICD-10-CM | POA: Diagnosis not present

## 2014-09-14 DIAGNOSIS — Z7982 Long term (current) use of aspirin: Secondary | ICD-10-CM | POA: Diagnosis not present

## 2014-09-14 DIAGNOSIS — R260 Ataxic gait: Secondary | ICD-10-CM | POA: Diagnosis not present

## 2014-09-14 DIAGNOSIS — E119 Type 2 diabetes mellitus without complications: Secondary | ICD-10-CM | POA: Diagnosis not present

## 2014-09-14 DIAGNOSIS — I10 Essential (primary) hypertension: Secondary | ICD-10-CM | POA: Diagnosis not present

## 2014-09-14 DIAGNOSIS — J449 Chronic obstructive pulmonary disease, unspecified: Secondary | ICD-10-CM | POA: Diagnosis not present

## 2014-09-14 DIAGNOSIS — M25561 Pain in right knee: Secondary | ICD-10-CM | POA: Diagnosis not present

## 2014-09-14 DIAGNOSIS — Z86711 Personal history of pulmonary embolism: Secondary | ICD-10-CM | POA: Diagnosis not present

## 2014-09-14 DIAGNOSIS — F329 Major depressive disorder, single episode, unspecified: Secondary | ICD-10-CM | POA: Diagnosis not present

## 2014-09-14 DIAGNOSIS — I5032 Chronic diastolic (congestive) heart failure: Secondary | ICD-10-CM | POA: Diagnosis not present

## 2014-09-14 DIAGNOSIS — Z9181 History of falling: Secondary | ICD-10-CM | POA: Diagnosis not present

## 2014-09-14 DIAGNOSIS — I480 Paroxysmal atrial fibrillation: Secondary | ICD-10-CM | POA: Diagnosis not present

## 2014-09-14 DIAGNOSIS — M810 Age-related osteoporosis without current pathological fracture: Secondary | ICD-10-CM | POA: Diagnosis not present

## 2014-09-14 DIAGNOSIS — F0390 Unspecified dementia without behavioral disturbance: Secondary | ICD-10-CM | POA: Diagnosis not present

## 2014-09-14 DIAGNOSIS — M25562 Pain in left knee: Secondary | ICD-10-CM | POA: Diagnosis not present

## 2014-09-20 DIAGNOSIS — Z7982 Long term (current) use of aspirin: Secondary | ICD-10-CM | POA: Diagnosis not present

## 2014-09-20 DIAGNOSIS — F329 Major depressive disorder, single episode, unspecified: Secondary | ICD-10-CM | POA: Diagnosis not present

## 2014-09-20 DIAGNOSIS — M25561 Pain in right knee: Secondary | ICD-10-CM | POA: Diagnosis not present

## 2014-09-20 DIAGNOSIS — I5032 Chronic diastolic (congestive) heart failure: Secondary | ICD-10-CM | POA: Diagnosis not present

## 2014-09-20 DIAGNOSIS — I10 Essential (primary) hypertension: Secondary | ICD-10-CM | POA: Diagnosis not present

## 2014-09-20 DIAGNOSIS — F0390 Unspecified dementia without behavioral disturbance: Secondary | ICD-10-CM | POA: Diagnosis not present

## 2014-09-20 DIAGNOSIS — Z9181 History of falling: Secondary | ICD-10-CM | POA: Diagnosis not present

## 2014-09-20 DIAGNOSIS — E119 Type 2 diabetes mellitus without complications: Secondary | ICD-10-CM | POA: Diagnosis not present

## 2014-09-20 DIAGNOSIS — I480 Paroxysmal atrial fibrillation: Secondary | ICD-10-CM | POA: Diagnosis not present

## 2014-09-20 DIAGNOSIS — Z86711 Personal history of pulmonary embolism: Secondary | ICD-10-CM | POA: Diagnosis not present

## 2014-09-20 DIAGNOSIS — M810 Age-related osteoporosis without current pathological fracture: Secondary | ICD-10-CM | POA: Diagnosis not present

## 2014-09-20 DIAGNOSIS — M25562 Pain in left knee: Secondary | ICD-10-CM | POA: Diagnosis not present

## 2014-09-20 DIAGNOSIS — R260 Ataxic gait: Secondary | ICD-10-CM | POA: Diagnosis not present

## 2014-09-20 DIAGNOSIS — J449 Chronic obstructive pulmonary disease, unspecified: Secondary | ICD-10-CM | POA: Diagnosis not present

## 2014-09-29 DIAGNOSIS — E119 Type 2 diabetes mellitus without complications: Secondary | ICD-10-CM | POA: Diagnosis not present

## 2014-09-30 ENCOUNTER — Encounter: Payer: Self-pay | Admitting: Family Medicine

## 2014-10-03 ENCOUNTER — Telehealth: Payer: Self-pay

## 2014-10-03 ENCOUNTER — Telehealth: Payer: Self-pay | Admitting: Family Medicine

## 2014-10-03 NOTE — Telephone Encounter (Signed)
-----   Message from Judy PimpleMarne A Tower, MD sent at 10/02/2014 10:20 AM EDT ----- A1C is improved at 7.1  No changes to make in medication

## 2014-10-03 NOTE — Telephone Encounter (Signed)
Patient's son,Darryl,returned Shannon's call.  Please call him back on his cell phone.

## 2014-10-03 NOTE — Telephone Encounter (Signed)
Left message for patient to call office regarding lab results.

## 2014-10-04 ENCOUNTER — Telehealth: Payer: Self-pay

## 2014-10-04 NOTE — Telephone Encounter (Signed)
Patient's son informed of lab results.

## 2014-11-08 ENCOUNTER — Telehealth: Payer: Self-pay | Admitting: *Deleted

## 2014-11-08 NOTE — Telephone Encounter (Signed)
Left voicemail letting [name] with nursing home know forms ready for pick-up

## 2014-11-08 NOTE — Telephone Encounter (Signed)
Done in IN box 

## 2014-11-08 NOTE — Telephone Encounter (Signed)
Lauren BallRobin gave me forms that pt's nursing home dropped off. Forms in your inbox

## 2014-11-21 DIAGNOSIS — B351 Tinea unguium: Secondary | ICD-10-CM | POA: Diagnosis not present

## 2014-11-21 DIAGNOSIS — B070 Plantar wart: Secondary | ICD-10-CM | POA: Diagnosis not present

## 2014-11-21 DIAGNOSIS — M79673 Pain in unspecified foot: Secondary | ICD-10-CM | POA: Diagnosis not present

## 2014-11-21 DIAGNOSIS — L851 Acquired keratosis [keratoderma] palmaris et plantaris: Secondary | ICD-10-CM | POA: Diagnosis not present

## 2014-12-05 DIAGNOSIS — N390 Urinary tract infection, site not specified: Secondary | ICD-10-CM | POA: Diagnosis not present

## 2014-12-12 ENCOUNTER — Ambulatory Visit (INDEPENDENT_AMBULATORY_CARE_PROVIDER_SITE_OTHER): Payer: Medicare Other | Admitting: Family Medicine

## 2014-12-12 ENCOUNTER — Encounter: Payer: Self-pay | Admitting: Family Medicine

## 2014-12-12 VITALS — BP 104/58 | HR 77 | Temp 97.6°F | Wt 151.0 lb

## 2014-12-12 DIAGNOSIS — E7800 Pure hypercholesterolemia, unspecified: Secondary | ICD-10-CM

## 2014-12-12 DIAGNOSIS — E559 Vitamin D deficiency, unspecified: Secondary | ICD-10-CM

## 2014-12-12 DIAGNOSIS — I10 Essential (primary) hypertension: Secondary | ICD-10-CM

## 2014-12-12 DIAGNOSIS — M810 Age-related osteoporosis without current pathological fracture: Secondary | ICD-10-CM

## 2014-12-12 DIAGNOSIS — E119 Type 2 diabetes mellitus without complications: Secondary | ICD-10-CM

## 2014-12-12 DIAGNOSIS — E538 Deficiency of other specified B group vitamins: Secondary | ICD-10-CM | POA: Diagnosis not present

## 2014-12-12 DIAGNOSIS — R8290 Unspecified abnormal findings in urine: Secondary | ICD-10-CM | POA: Insufficient documentation

## 2014-12-12 DIAGNOSIS — Z0000 Encounter for general adult medical examination without abnormal findings: Secondary | ICD-10-CM | POA: Diagnosis not present

## 2014-12-12 LAB — COMPREHENSIVE METABOLIC PANEL
ALBUMIN: 4.3 g/dL (ref 3.5–5.2)
ALT: 11 U/L (ref 0–35)
AST: 13 U/L (ref 0–37)
Alkaline Phosphatase: 48 U/L (ref 39–117)
BUN: 27 mg/dL — AB (ref 6–23)
CHLORIDE: 105 meq/L (ref 96–112)
CO2: 26 meq/L (ref 19–32)
Calcium: 9.8 mg/dL (ref 8.4–10.5)
Creatinine, Ser: 1.12 mg/dL (ref 0.40–1.20)
GFR: 49.39 mL/min — ABNORMAL LOW (ref >60.00)
GLUCOSE: 135 mg/dL — AB (ref 70–99)
POTASSIUM: 3.8 meq/L (ref 3.5–5.1)
Sodium: 141 mEq/L (ref 135–145)
TOTAL PROTEIN: 6.8 g/dL (ref 6.0–8.3)
Total Bilirubin: 0.3 mg/dL (ref 0.2–1.2)

## 2014-12-12 LAB — POCT URINALYSIS DIPSTICK
Bilirubin, UA: 2
GLUCOSE UA: NEGATIVE
Ketones, UA: NEGATIVE
Nitrite, UA: NEGATIVE
RBC UA: NEGATIVE
Spec Grav, UA: >=1.03
Urobilinogen, UA: 0.2
pH, UA: 6

## 2014-12-12 LAB — CBC WITH DIFFERENTIAL/PLATELET
BASOS ABS: 0 10*3/uL (ref 0.0–0.1)
BASOS PCT: 0.5 % (ref 0.0–3.0)
EOS ABS: 0.1 10*3/uL (ref 0.0–0.7)
Eosinophils Relative: 1.1 % (ref 0.0–5.0)
HCT: 37.9 % (ref 36.0–46.0)
HEMOGLOBIN: 12.1 g/dL (ref 12.0–15.0)
Lymphocytes Relative: 26.6 % (ref 12.0–46.0)
Lymphs Abs: 2.4 10*3/uL (ref 0.7–4.0)
MCHC: 31.9 g/dL (ref 30.0–36.0)
MCV: 90.4 fl (ref 78.0–100.0)
Monocytes Absolute: 0.5 10*3/uL (ref 0.1–1.0)
Monocytes Relative: 6.1 % (ref 3.0–12.0)
NEUTROS PCT: 65.7 % (ref 43.0–77.0)
Neutro Abs: 5.9 10*3/uL (ref 1.4–7.7)
Platelets: 263 10*3/uL (ref 150.0–400.0)
RBC: 4.19 Mil/uL (ref 3.87–5.11)
RDW: 14 % (ref 11.5–15.5)
WBC: 8.9 10*3/uL (ref 4.0–10.5)

## 2014-12-12 LAB — LIPID PANEL
CHOL/HDL RATIO: 3
Cholesterol: 168 mg/dL (ref 0–200)
HDL: 53.5 mg/dL (ref >39.00)
LDL CALC: 91 mg/dL (ref 0–99)
NONHDL: 114.5
Triglycerides: 118 mg/dL (ref 0.0–149.0)
VLDL: 23.6 mg/dL (ref 0.0–40.0)

## 2014-12-12 LAB — TSH: TSH: 1.93 u[IU]/mL (ref 0.35–4.50)

## 2014-12-12 LAB — VITAMIN B12: VITAMIN B 12: 1375 pg/mL — AB (ref 211–911)

## 2014-12-12 LAB — VITAMIN D 25 HYDROXY (VIT D DEFICIENCY, FRACTURES): VITD: 19.98 ng/mL — AB (ref 30.00–100.00)

## 2014-12-12 LAB — HEMOGLOBIN A1C: HEMOGLOBIN A1C: 6.6 % — AB (ref 4.6–6.5)

## 2014-12-12 NOTE — Assessment & Plan Note (Signed)
ua today is borderline Sent for cx High SG-inst to inc fluid/water intake - noted on inst for [employer]

## 2014-12-12 NOTE — Progress Notes (Signed)
Subjective:    Patient ID: [name], female    DOB: [birth year], 81 y.o.   MRN: [id number]  HPI Here for annual medicare wellness visit as well as chronic/acute medical problems as well as a routine preventative exam   I have personally reviewed the Medicare Annual Wellness questionnaire and have noted 1. The patient's medical and social history 2. Their use of alcohol, tobacco or illicit drugs 3. Their current medications and supplements 4. The patient's functional ability including ADL's, fall risks, home safety risks and hearing or visual             impairment. 5. Diet and physical activities 6. Evidence for depression or mood disorders  The patients weight, height, BMI have been recorded in the chart and visual acuity is per eye clinic.  I have made referrals, counseling and provided education to the patient based review of the above and I have provided the pt with a written personalized care plan for preventive services. Reviewed and updated provider list, see scanned forms.  See scanned forms.  Routine anticipatory guidance given to patient.  See health maintenance. Colon cancer screening colonosc 3/06 - has aged out , declines screening  Breast cancer screening - declines mammograms at her age  Self breast exam- does not do (elderly/dementia) Flu vaccine 10/15 Tetanus vaccine  Pneumovax complete on them as of 3/15 Zoster vaccine 1/08 dexa 11/01 - hx of OP , last fracture was over 2 years ago , has had a few falls (in past 6 mo)- declines further dexa at this point  (finished course of fosamax in the past)  Takes her vit D (hx of low D)  Advance directive- has a living will and POA  Cognitive function addressed- see scanned forms- and if abnormal then additional documentation follows. - pt has dementia , pt states at times it is worse and at times it is better  Steady decline per family  In nursing home   PMH and SH reviewed  Meds, vitals, and allergies reviewed.    ROS: See HPI.  Otherwise negative.     [name] wants another order for a UA -more confused lately  Son wonders if she is dehydrated - drinks water and tea only  Wants to see if we can get that today    DM-  Lab Results  Component Value Date   HGBA1C 7.6* 06/03/2014  is not drinking juices now On metformin Re check at her facility -improved to 7.1   bp is stable today  No cp or palpitations or headaches or edema  No side effects to medicines  BP Readings from Last 3 Encounters:  12/12/14 104/58  07/13/14 128/78  06/03/14 124/74     Wt is down 11 lb with bmi of 23   Pt states that her appetite is fine  Stopped juices and meal supplement powder  Will continue to watch trends   Eye exam - has macular degeneration and cataracts (? If they will consider cataract removal)  Went 1 1/2 years ago - went to [facility]   Patient Active Problem List   Diagnosis Date Noted  . Encounter for Medicare annual wellness exam 12/12/2014  . Routine general medical examination at a health care facility 12/12/2014  . Syncope 08/26/2013  . Bradycardia 08/26/2013  . Hip pain 06/30/2013  . Fall at nursing home 06/30/2013  . Near syncope 09/30/2012  . Headache(784.0) 09/30/2012  . Failure to thrive in adult 09/25/2012  . Depression 09/25/2012  .  Abdominal pain, lower 09/25/2012  . Vitamin B12 deficiency 09/08/2012  . Hypertrophic toenail 07/21/2012  . OSA (obstructive sleep apnea) 07/15/2012  . PE (pulmonary embolism) 06/17/2012  . Chest pain 06/16/2012  . Esophageal dysmotility suspected 06/15/2012  . GERD (gastroesophageal reflux disease) 06/15/2012  . Acute and chronic respiratory failure 04/23/2012  . Dementia 04/21/2012  . Chronic diastolic heart failure 04/21/2012  . COPD (chronic obstructive pulmonary disease) 04/21/2012  . Mixed incontinence urge and stress 01/04/2011  . Vitamin D deficiency 07/14/2010  . ANEMIA 01/24/2009  . Former smoker 11/04/2007  . MORTON'S  NEUROMA 11/03/2007  . MACULAR DEGENERATION 11/03/2007  . Essential hypertension 11/03/2007  . Paroxysmal atrial fibrillation 11/03/2007  . ALLERGIC RHINITIS 11/03/2007  . Osteoporosis 11/03/2007  . Diabetes type 2, controlled 11/03/2007  . HYPERCHOLESTEROLEMIA 11/25/2006   Past Medical History  Diagnosis Date  . Allergy     allergic Rhintis  . Hyperlipidemia   . Hypertension   . Osteoporosis   . PAF (paroxysmal atrial fibrillation)     a. PAF dates back > 5 yrs, prev on coumadin->d/c 2006 2/2 GIB;   . Carotid stenosis     a. 09/2007: Carotid Doppler stable- stable bilateral stenosis 40-59% (from 06)  . Type II diabetes mellitus   . Tobacco abuse     a. ongoing - 50+ pack yr hx.  . Dementia     Dementia in Hospital //Does O.K. at home.  . Diverticulosis   . GI bleeding     a. when on coumadin in 2006 - colonoscopy @ that time showed 2 large right colon avm's, multiple small bowel avm's, fresh blood in prox small bowel.  . AVM (arteriovenous malformation) of colon     a. 2006 colonscopy  . Chronic respiratory failure   . COPD (chronic obstructive pulmonary disease)   . Chronic diastolic CHF (congestive heart failure)   . Normocytic anemia 03/2012  . Complication of anesthesia   . PONV (postoperative nausea and vomiting)   . GERD (gastroesophageal reflux disease)   . Pulmonary embolism     SP IVC filter   Past Surgical History  Procedure Laterality Date  . Cardiac catheterization  1990's    clear  . Orif tibia & fibula fractures  05/2004  . Small bowel enteroscopy  03/2005    AVMS  . Colonoscopy  08/2004  . Esophagogastroduodenoscopy  08/2004    Multiple    History  Substance Use Topics  . Smoking status: Former Smoker -- 0.50 packs/day for 50 years    Types: Cigarettes    Quit date: 04/21/2012  . Smokeless tobacco: Never Used  . Alcohol Use: No   Family History  Problem Relation Age of Onset  . Stroke Mother     a. believes she died suddenly @ 31.  . Diabetes  Mother   . Stroke Father     Died from cerebral hemorrhage @ 69  . Diabetes Father   . Cancer Sister     colon and stomach cancer  . Diabetes Brother   . Heart disease Brother   . Cancer Brother     died from lung cancer  . Heart disease Brother     MI  . Cancer Sister     ? liver//brain cancer  . Heart disease Sister   . Diabetes Sister     diabetes and glaucoma  . Cancer Brother     pancreatic cancer  . Diabetes Son    Allergies  Allergen Reactions  .  Azithromycin Other (See Comments)    inc blood sugar  . Codeine Itching  . Coumadin [Warfarin] Other (See Comments)    Causes bleeding  . Oxybutynin Chloride Er Other (See Comments)    Constipation/ dizziness/ dry mouth   Current Outpatient Prescriptions on File Prior to Visit  Medication Sig Dispense Refill  . acetaminophen (TYLENOL) 325 MG tablet Take 650 mg by mouth every 4 (four) hours as needed for mild pain or headache.    [name] aspirin 81 MG chewable tablet Chew 81 mg by mouth daily.    [name] atorvastatin (LIPITOR) 10 MG tablet Take 1 tablet (10 mg total) by mouth daily. 90 tablet 3  . budesonide (PULMICORT FLEXHALER) 180 MCG/ACT inhaler Inhale 1 puff into the lungs 2 (two) times daily. 1 Inhaler 6  . Cholecalciferol (VITAMIN D-3) 1000 UNITS CAPS Take 1,000 Units by mouth daily.     [name] docusate sodium (COLACE) 100 MG capsule Take 1 capsule (100 mg total) by mouth 2 (two) times daily as needed for constipation.    [name] donepezil (ARICEPT) 10 MG tablet Take 10 mg by mouth daily.     . fluticasone (FLONASE) 50 MCG/ACT nasal spray Place 2 sprays into the nose daily as needed for allergies.    . hydrALAZINE (APRESOLINE) 25 MG tablet Take 1 tablet (25 mg total) by mouth 2 (two) times daily.    . lansoprazole (PREVACID) 15 MG capsule Take 1 capsule (15 mg total) by mouth daily. 90 capsule 1  . levalbuterol (XOPENEX HFA) 45 MCG/ACT inhaler Inhale 1-2 puffs into the lungs every 4 (four) hours as needed for wheezing or shortness of breath.      . magnesium hydroxide (MILK OF MAGNESIA) 400 MG/5ML suspension Take 15 mLs by mouth daily as needed for constipation.    . metFORMIN (GLUCOPHAGE) 1000 MG tablet Take 1 tablet (1,000 mg total) by mouth 2 (two) times daily with a meal. 60 tablet 5  . mirtazapine (REMERON) 15 MG tablet Take 15 mg by mouth at bedtime.    . polyethylene glycol (MIRALAX / GLYCOLAX) packet Take 17 g by mouth daily as needed (constipation).    [name] tiotropium (SPIRIVA HANDIHALER) 18 MCG inhalation capsule Place 1 capsule (18 mcg total) into inhaler and inhale daily. 30 capsule 5  . vitamin B-12 (CYANOCOBALAMIN) 1000 MCG tablet Take 1,000 mcg by mouth every morning.      No current facility-administered medications on file prior to visit.      Review of Systems    Review of Systems  Constitutional: Negative for fever, appetite change, fatigue and unexpected weight change.  Eyes: Negative for pain and visual disturbance.  Respiratory: Negative for cough and shortness of breath.  (stable copd) Cardiovascular: Negative for cp or palpitations    Gastrointestinal: Negative for nausea, diarrhea and constipation.  Genitourinary: Negative for urgency and frequency. pos for strong urine odor  Skin: Negative for pallor or rash   Neurological: Negative for weakness, light-headedness, numbness and headaches. pos for memory loss/ dementia  Hematological: Negative for adenopathy. Does not bruise/bleed easily.  Psychiatric/Behavioral: Negative for dysphoric mood. The patient is not nervous/anxious.  pos for more confusion lately    Objective:   Physical Exam  Constitutional: She appears well-developed and well-nourished. No distress.  Frail appearing elderly female with dementia- req asst with ambulation   HENT:  Head: Normocephalic and atraumatic.  Right Ear: External ear normal.  Left Ear: External ear normal.  Mouth/Throat: Oropharynx is clear and moist.  Eyes: Conjunctivae  and EOM are normal. Pupils are equal,  round, and reactive to light. No scleral icterus.  Neck: Normal range of motion. Neck supple. No JVD present. Carotid bruit is not present. No thyromegaly present.  Cardiovascular: Normal rate, regular rhythm, normal heart sounds and intact distal pulses.  Exam reveals no gallop.   Pulmonary/Chest: Effort normal and breath sounds normal. No respiratory distress. She has no wheezes. She exhibits no tenderness.  Diffusely distant bs   Abdominal: Soft. Bowel sounds are normal. She exhibits no distension, no abdominal bruit and no mass. There is no tenderness.  No suprapubic tenderness or fullness    No cva tenderness   Genitourinary: No breast swelling, tenderness, discharge or bleeding.  Breast exam: No mass, nodules, thickening, tenderness, bulging, retraction, inflamation, nipple discharge or skin changes noted.  No axillary or clavicular LA.      Musculoskeletal: Normal range of motion. She exhibits no edema or tenderness.  Kyphosis noted   Lymphadenopathy:    She has no cervical adenopathy.  Neurological: She is alert. She has normal reflexes. No cranial nerve deficit. She exhibits normal muscle tone. Coordination normal.  Skin: Skin is warm and dry. No rash noted. No erythema. No pallor.  Psychiatric: She has a normal mood and affect.  Baseline dementia  Attentive today and pleasant           Assessment & Plan:   Problem List Items Addressed This Visit    Abnormal urine odor    ua today is borderline Sent for cx High SG-inst to inc fluid/water intake - noted on inst for [facility]       Relevant Orders   POCT urinalysis dipstick (Completed)   Urine culture   Diabetes type 2, controlled    Last A1C was improved at 7.1 with dec sugar intake and no juices Re check today  Wt loss noted Watching intake       Relevant Orders   Hemoglobin A1c (Completed)   Encounter for Medicare annual wellness exam - Primary    Reviewed health habits including diet and exercise and skin  cancer prevention Reviewed appropriate screening tests for age  Also reviewed health mt list, fam hx and immunization status , as well as social and family history   See HPI Labs today Declines much health mt incl breast and colon cancer screening and bone density testing Needs Td update - inst to get at the health dept       RESOLVED: Essential hypertension   HYPERCHOLESTEROLEMIA    Disc goals for lipids and reasons to control them Lab today  on atorvastatin and diet  Rev low sat fat diet in detail       Relevant Orders   Lipid panel (Completed)   Osteoporosis    Declines further dexa  Has taken a course of fosamax in the past  No new fractures  Does occ fall - and fall prev has been addressed at nursing home       Routine general medical examination at a health care facility    Reviewed health habits including diet and exercise and skin cancer prevention Reviewed appropriate screening tests for age  Also reviewed health mt list, fam hx and immunization status , as well as social and family history   See HPI Labs today Declines much health mt incl breast and colon cancer screening and bone density testing Needs Td update - inst to get at the health dept       Relevant Orders  CBC with Differential/Platelet (Completed)   Comprehensive metabolic panel (Completed)   TSH (Completed)   Lipid panel (Completed)   Vitamin B12 deficiency    Level today  Energy level has been stable  Dementia fairly stable       Relevant Orders   Vitamin B12 (Completed)   Vitamin D deficiency    Level today  Disc imp to bone and overall health        Relevant Orders   Vit D  25 hydroxy (rtn osteoporosis monitoring) (Completed)

## 2014-12-12 NOTE — Assessment & Plan Note (Signed)
Level today  Energy level has been stable  Dementia fairly stable

## 2014-12-12 NOTE — Assessment & Plan Note (Signed)
Reviewed health habits including diet and exercise and skin cancer prevention Reviewed appropriate screening tests for age  Also reviewed health mt list, fam hx and immunization status , as well as social and family history   See HPI Labs today Declines much health mt incl breast and colon cancer screening and bone density testing Needs Td update - inst to get at the health dept

## 2014-12-12 NOTE — Progress Notes (Signed)
Pre visit review using our clinic review tool, if applicable. No additional management support is needed unless otherwise documented below in the visit note. 

## 2014-12-12 NOTE — Patient Instructions (Signed)
Please encourage more fluid intake -water and tea (more water than tea if possible)  Urinalysis today Labs today  Follow up with her eye doctor when you can

## 2014-12-12 NOTE — Assessment & Plan Note (Signed)
Last A1C was improved at 7.1 with dec sugar intake and no juices Re check today  Wt loss noted Watching intake

## 2014-12-12 NOTE — Assessment & Plan Note (Signed)
Reviewed health habits including diet and exercise and skin cancer prevention Reviewed appropriate screening tests for age  Also reviewed health mt list, fam hx and immunization status , as well as social and family history   See HPI Labs today Declines much health mt incl breast and colon cancer screening and bone density testing Needs Td update - inst to get at the health dept  

## 2014-12-12 NOTE — Assessment & Plan Note (Signed)
Disc goals for lipids and reasons to control them Lab today  on atorvastatin and diet  Rev low sat fat diet in detail

## 2014-12-12 NOTE — Assessment & Plan Note (Signed)
Level today  °Disc imp to bone and overall health °

## 2014-12-12 NOTE — Assessment & Plan Note (Signed)
Declines further dexa  Has taken a course of fosamax in the past  No new fractures  Does occ fall - and fall prev has been addressed at nursing home

## 2014-12-14 LAB — URINE CULTURE: Colony Count: 25000

## 2015-01-23 DIAGNOSIS — M79673 Pain in unspecified foot: Secondary | ICD-10-CM | POA: Diagnosis not present

## 2015-01-23 DIAGNOSIS — B351 Tinea unguium: Secondary | ICD-10-CM | POA: Diagnosis not present

## 2015-01-23 DIAGNOSIS — L851 Acquired keratosis [keratoderma] palmaris et plantaris: Secondary | ICD-10-CM | POA: Diagnosis not present

## 2015-01-23 DIAGNOSIS — B070 Plantar wart: Secondary | ICD-10-CM | POA: Diagnosis not present

## 2015-03-27 DIAGNOSIS — M79673 Pain in unspecified foot: Secondary | ICD-10-CM | POA: Diagnosis not present

## 2015-03-27 DIAGNOSIS — B070 Plantar wart: Secondary | ICD-10-CM | POA: Diagnosis not present

## 2015-03-27 DIAGNOSIS — L851 Acquired keratosis [keratoderma] palmaris et plantaris: Secondary | ICD-10-CM | POA: Diagnosis not present

## 2015-03-27 DIAGNOSIS — B351 Tinea unguium: Secondary | ICD-10-CM | POA: Diagnosis not present

## 2015-04-12 ENCOUNTER — Telehealth: Payer: Self-pay | Admitting: Family Medicine

## 2015-04-12 NOTE — Telephone Encounter (Signed)
Son called concerned about her weight and a low carb diet that the facility has began.  Pt used to be on protein powder and gained weight, but her a1c was 2 pts higher. Son thinks that even though the a1c was higher, it was still helpful  cb number is [phone]

## 2015-04-12 NOTE — Telephone Encounter (Signed)
Does he think the protein powder helped (and that was what they stopped?) How many times did she get it - and in what form ? (? Shake)- I would be happy to re order it

## 2015-04-13 NOTE — Telephone Encounter (Signed)
Left message on son's voicemail letting him know Dr. Royden Purlower's comments and to call back

## 2015-04-14 NOTE — Telephone Encounter (Signed)
I got that fax already and wrote the order to do that - if they did not rec that please let me know  Thanks for the heads up

## 2015-04-14 NOTE — Telephone Encounter (Signed)
They have received it

## 2015-04-14 NOTE — Telephone Encounter (Signed)
Spoke to MorralPaula and she is requesting we send an order to her assisted living saying that pt needs to be on a regular diet not a low carb diet since she is losing weight, also to give her the protein powder once daily mixing it with water or milk. Pt is loosing weight and they think its because the nursing home put pt on a low card diet and stopped her protein powder (it wasn't a shake it's a powder they mix with water or milk)

## 2015-06-12 DIAGNOSIS — B070 Plantar wart: Secondary | ICD-10-CM | POA: Diagnosis not present

## 2015-06-12 DIAGNOSIS — L851 Acquired keratosis [keratoderma] palmaris et plantaris: Secondary | ICD-10-CM | POA: Diagnosis not present

## 2015-06-12 DIAGNOSIS — B351 Tinea unguium: Secondary | ICD-10-CM | POA: Diagnosis not present

## 2015-06-12 DIAGNOSIS — M79673 Pain in unspecified foot: Secondary | ICD-10-CM | POA: Diagnosis not present

## 2015-06-14 ENCOUNTER — Ambulatory Visit (INDEPENDENT_AMBULATORY_CARE_PROVIDER_SITE_OTHER): Payer: Medicare Other | Admitting: Family Medicine

## 2015-06-14 ENCOUNTER — Encounter: Payer: Self-pay | Admitting: Family Medicine

## 2015-06-14 ENCOUNTER — Telehealth: Payer: Self-pay | Admitting: Family Medicine

## 2015-06-14 VITALS — BP 130/82 | HR 67 | Temp 98.2°F | Ht 62.0 in | Wt 147.8 lb

## 2015-06-14 DIAGNOSIS — J449 Chronic obstructive pulmonary disease, unspecified: Secondary | ICD-10-CM | POA: Diagnosis not present

## 2015-06-14 DIAGNOSIS — F0390 Unspecified dementia without behavioral disturbance: Secondary | ICD-10-CM

## 2015-06-14 DIAGNOSIS — I5032 Chronic diastolic (congestive) heart failure: Secondary | ICD-10-CM

## 2015-06-14 DIAGNOSIS — E119 Type 2 diabetes mellitus without complications: Secondary | ICD-10-CM

## 2015-06-14 DIAGNOSIS — J3089 Other allergic rhinitis: Secondary | ICD-10-CM

## 2015-06-14 LAB — HEMOGLOBIN A1C: Hgb A1c MFr Bld: 6.6 % — ABNORMAL HIGH (ref 4.6–6.5)

## 2015-06-14 LAB — BASIC METABOLIC PANEL
BUN: 24 mg/dL — ABNORMAL HIGH (ref 6–23)
CALCIUM: 9.8 mg/dL (ref 8.4–10.5)
CO2: 31 meq/L (ref 19–32)
CREATININE: 0.83 mg/dL (ref 0.40–1.20)
Chloride: 104 mEq/L (ref 96–112)
GFR: 69.7 mL/min (ref >60.00)
Glucose, Bld: 113 mg/dL — ABNORMAL HIGH (ref 70–99)
Potassium: 4.4 mEq/L (ref 3.5–5.1)
Sodium: 142 mEq/L (ref 135–145)

## 2015-06-14 NOTE — Progress Notes (Signed)
Subjective:    Patient ID: [name], female    DOB: 11/08/1929, 81 y.o.   MRN: [id number]  HPI Here for f/u of chronic health problems   Wt is down 4 lb with bmi of 27  Changed to regular diet to prevent more wt loss (is a diabetic) Appetite is changeable with dementia - family notes she eats pretty well when they are there   bp is stable today  No cp or palpitations or headaches or edema  No side effects to medicines  BP Readings from Last 3 Encounters:  06/14/15 130/82  12/12/14 104/58  07/13/14 128/78     Hx of CHF-no changes/no new symptoms  No leg swelling or chest pain    Hx of COPD- no changes /no new changes  Breathing fairly well    DM2 Lab Results  Component Value Date   HGBA1C 6.6* 12/12/2014   Due for a check  Will see how it looks today with a regular diet    Dementia On aricept and also on remeron for sleep/mood /appetite  Some socialization - some outings that she enjoys  Just went to look at [employer]  Per family - memory loss continues to progress  Mood is fairly stable - can engage her  Sleeps well with mirtazapine   Using walker at all times- not very steady  No falls however   Patient Active Problem List   Diagnosis Date Noted  . Encounter for Medicare annual wellness exam 12/12/2014  . Routine general medical examination at a health care facility 12/12/2014  . Abnormal urine odor 12/12/2014  . Syncope 08/26/2013  . Bradycardia 08/26/2013  . Hip pain 06/30/2013  . Fall at nursing home 06/30/2013  . Near syncope 09/30/2012  . Headache(784.0) 09/30/2012  . Failure to thrive in adult 09/25/2012  . Depression 09/25/2012  . Abdominal pain, lower 09/25/2012  . Vitamin B12 deficiency 09/08/2012  . Hypertrophic toenail 07/21/2012  . OSA (obstructive sleep apnea) 07/15/2012  . PE (pulmonary embolism) 06/17/2012  . Chest pain 06/16/2012  . Esophageal dysmotility suspected 06/15/2012  . GERD (gastroesophageal reflux disease)  06/15/2012  . Acute and chronic respiratory failure 04/23/2012  . Dementia 04/21/2012  . Chronic diastolic heart failure (HCC) 04/21/2012  . COPD (chronic obstructive pulmonary disease) (HCC) 04/21/2012  . Mixed incontinence urge and stress 01/04/2011  . Vitamin D deficiency 07/14/2010  . ANEMIA 01/24/2009  . Former smoker 11/04/2007  . MORTON'S NEUROMA 11/03/2007  . MACULAR DEGENERATION 11/03/2007  . ALLERGIC RHINITIS 11/03/2007  . Osteoporosis 11/03/2007  . Diabetes type 2, controlled (HCC) 11/03/2007  . HYPERCHOLESTEROLEMIA 11/25/2006   Past Medical History  Diagnosis Date  . Allergy     allergic Rhintis  . Hyperlipidemia   . Hypertension   . Osteoporosis   . PAF (paroxysmal atrial fibrillation) (HCC)     a. PAF dates back > 5 yrs, prev on coumadin->d/c 2006 2/2 GIB;   . Carotid stenosis     a. 09/2007: Carotid Doppler stable- stable bilateral stenosis 40-59% (from 06)  . Type II diabetes mellitus (HCC)   . Tobacco abuse     a. ongoing - 50+ pack yr hx.  . Dementia     Dementia in Hospital //Does O.K. at home.  . Diverticulosis   . GI bleeding     a. when on coumadin in 2006 - colonoscopy @ that time showed 2 large right colon avm's, multiple small bowel avm's, fresh blood in prox small bowel.  .[name]  AVM (arteriovenous malformation) of colon     a. 2006 colonscopy  . Chronic respiratory failure (HCC)   . COPD (chronic obstructive pulmonary disease) (HCC)   . Chronic diastolic CHF (congestive heart failure) (HCC)   . Normocytic anemia 03/2012  . Complication of anesthesia   . PONV (postoperative nausea and vomiting)   . GERD (gastroesophageal reflux disease)   . Pulmonary embolism (HCC)     SP IVC filter   Past Surgical History  Procedure Laterality Date  . Cardiac catheterization  1990's    clear  . Orif tibia & fibula fractures  05/2004  . Small bowel enteroscopy  03/2005    AVMS  . Colonoscopy  08/2004  . Esophagogastroduodenoscopy  08/2004    Multiple     Social History  Substance Use Topics  . Smoking status: Former Smoker -- 0.50 packs/day for 50 years    Types: Cigarettes    Quit date: 04/21/2012  . Smokeless tobacco: Never Used  . Alcohol Use: No   Family History  Problem Relation Age of Onset  . Stroke Mother     a. believes she died suddenly @ 39.  . Diabetes Mother   . Stroke Father     Died from cerebral hemorrhage @ 47  . Diabetes Father   . Cancer Sister     colon and stomach cancer  . Diabetes Brother   . Heart disease Brother   . Cancer Brother     died from lung cancer  . Heart disease Brother     MI  . Cancer Sister     ? liver//brain cancer  . Heart disease Sister   . Diabetes Sister     diabetes and glaucoma  . Cancer Brother     pancreatic cancer  . Diabetes Son    Allergies  Allergen Reactions  . Azithromycin Other (See Comments)    inc blood sugar  . Codeine Itching  . Coumadin [Warfarin] Other (See Comments)    Causes bleeding  . Oxybutynin Chloride Er Other (See Comments)    Constipation/ dizziness/ dry mouth   Current Outpatient Prescriptions on File Prior to Visit  Medication Sig Dispense Refill  . acetaminophen (TYLENOL) 325 MG tablet Take 650 mg by mouth every 4 (four) hours as needed for mild pain or headache.    [name] aspirin 81 MG chewable tablet Chew 81 mg by mouth daily.    [name] atorvastatin (LIPITOR) 10 MG tablet Take 1 tablet (10 mg total) by mouth daily. 90 tablet 3  . budesonide (PULMICORT FLEXHALER) 180 MCG/ACT inhaler Inhale 1 puff into the lungs 2 (two) times daily. 1 Inhaler 6  . Cholecalciferol (VITAMIN D-3) 1000 UNITS CAPS Take 1,000 Units by mouth daily.     [name] docusate sodium (COLACE) 100 MG capsule Take 1 capsule (100 mg total) by mouth 2 (two) times daily as needed for constipation.    [name] donepezil (ARICEPT) 10 MG tablet Take 10 mg by mouth daily.     . fluticasone (FLONASE) 50 MCG/ACT nasal spray Place 2 sprays into the nose daily as needed for allergies.    . hydrALAZINE  (APRESOLINE) 25 MG tablet Take 1 tablet (25 mg total) by mouth 2 (two) times daily.    . lansoprazole (PREVACID) 15 MG capsule Take 1 capsule (15 mg total) by mouth daily. 90 capsule 1  . levalbuterol (XOPENEX HFA) 45 MCG/ACT inhaler Inhale 1-2 puffs into the lungs every 4 (four) hours as needed for wheezing or shortness of  breath.     . magnesium hydroxide (MILK OF MAGNESIA) 400 MG/5ML suspension Take 15 mLs by mouth daily as needed for constipation.    . metFORMIN (GLUCOPHAGE) 1000 MG tablet Take 1 tablet (1,000 mg total) by mouth 2 (two) times daily with a meal. 60 tablet 5  . mirtazapine (REMERON) 15 MG tablet Take 15 mg by mouth at bedtime.    . polyethylene glycol (MIRALAX / GLYCOLAX) packet Take 17 g by mouth daily as needed (constipation).    [name] tiotropium (SPIRIVA HANDIHALER) 18 MCG inhalation capsule Place 1 capsule (18 mcg total) into inhaler and inhale daily. 30 capsule 5  . vitamin B-12 (CYANOCOBALAMIN) 1000 MCG tablet Take 1,000 mcg by mouth every morning.      No current facility-administered medications on file prior to visit.    Review of Systems Review of Systems  Constitutional: Negative for fever, appetite change, fatigue and unexpected weight change.  ENT pos for chronically runny nose  Eyes: Negative for pain and visual disturbance.  Respiratory: Negative for cough and shortness of breath.   Cardiovascular: Negative for cp or palpitations    Gastrointestinal: Negative for nausea, diarrhea and constipation.  Genitourinary: Negative for urgency and frequency.  Skin: Negative for pallor or rash   Neurological: Negative for weakness, light-headedness, numbness and headaches.  Hematological: Negative for adenopathy. Does not bruise/bleed easily.  Psychiatric/Behavioral: Negative for dysphoric mood. The patient is occ anxious, pos for progressive dementia with short term memory loss        Objective:   Physical Exam  Constitutional: She appears well-developed and  well-nourished. No distress.  Frail appearing elderly female-dozing at first and then quite alert and talkative   HENT:  Head: Normocephalic and atraumatic.  Mouth/Throat: Oropharynx is clear and moist.  Eyes: Conjunctivae and EOM are normal. Pupils are equal, round, and reactive to light.  Neck: Normal range of motion. Neck supple. No JVD present. Carotid bruit is not present. No thyromegaly present.  Cardiovascular: Normal rate, regular rhythm, normal heart sounds and intact distal pulses.  Exam reveals no gallop.   Pulmonary/Chest: Effort normal and breath sounds normal. No respiratory distress. She has no wheezes. She has no rales.  No crackles or dullness  Diffusely distant bs    Abdominal: Soft. Bowel sounds are normal. She exhibits no distension, no abdominal bruit and no mass. There is no tenderness.  Musculoskeletal: She exhibits no edema.  Lymphadenopathy:    She has no cervical adenopathy.  Neurological: She is alert. She has normal reflexes.  Skin: Skin is warm and dry. No rash noted.  Psychiatric: Her behavior is normal. Thought content normal. Her mood appears not anxious. Her affect is not blunt and not labile. Her speech is tangential. Her speech is not rapid and/or pressured and not delayed. Cognition and memory are impaired. She does not exhibit a depressed mood. She exhibits abnormal recent memory.  Pt is quite talkative when she wakes up  Pleasant and cheerful  Tangential and often confabulates with dementia           Assessment & Plan:   Problem List Items Addressed This Visit      Cardiovascular and Mediastinum   Chronic diastolic heart failure (HCC) - Primary    Pt is stable - no new sob or edema           Respiratory   Allergic rhinitis    Pt suffers from chronic nasal congestion - but forgets to ask for flonase Changed that order  to qd instead of prn Update if any side eff or problems or if no improvement       COPD (chronic obstructive  pulmonary disease) (HCC)    Stable clinically  No change in medicines  For pulmonary f/u next month         Endocrine   Diabetes type 2, controlled (HCC)    A1C today Expect it will be up -on regular diet and some protein supplements (to prevent more wt loss)  Disc parameters  No symptoms Does need annual DM eye exam-family will schedule that       Relevant Orders   Hemoglobin A1c (Completed)   Basic metabolic panel (Completed)     Nervous and Auditory   Dementia    Slowly progressive  Having some auditory hallucinations Mirtazapine helps mood and sleep and appetite  Safety discussed -no falls , is monitored closely  She is enjoying socialization and trips out - reassuring Continue aricept at current dose

## 2015-06-14 NOTE — Assessment & Plan Note (Signed)
Slowly progressive  Having some auditory hallucinations Mirtazapine helps mood and sleep and appetite  Safety discussed -no falls , is monitored closely  She is enjoying socialization and trips out - reassuring Continue aricept at current dose

## 2015-06-14 NOTE — Assessment & Plan Note (Signed)
A1C today Expect it will be up -on regular diet and some protein supplements (to prevent more wt loss)  Disc parameters  No symptoms Does need annual DM eye exam-family will schedule that

## 2015-06-14 NOTE — Assessment & Plan Note (Signed)
Pt suffers from chronic nasal congestion - but forgets to ask for flonase Changed that order to qd instead of prn Update if any side eff or problems or if no improvement

## 2015-06-14 NOTE — Telephone Encounter (Signed)
Son would like to know if someone can come to [facility]  To get labs for her for her upcoming cpe rather than the pt coming into the office. Son states taht he has seen [facility] comein and get specimens from residents? cb number is [phone]

## 2015-06-14 NOTE — Telephone Encounter (Signed)
Terri- do you know if this is something we can order or arrange? Thanks

## 2015-06-14 NOTE — Progress Notes (Signed)
Pre visit review using our clinic review tool, if applicable. No additional management support is needed unless otherwise documented below in the visit note. 

## 2015-06-14 NOTE — Assessment & Plan Note (Signed)
Stable clinically  No change in medicines  For pulmonary f/u next month

## 2015-06-14 NOTE — Patient Instructions (Addendum)
Labs today for diabetes  Follow up in 6 months for annual exam   Encourage regular meals and activity (supervised with walker) and socialization  I made the colace and flonase regular orders instead of prn  If any problems with this please let me know   Please get an appt for diabetic annual exam   Take care

## 2015-06-14 NOTE — Assessment & Plan Note (Signed)
Pt is stable - no new sob or edema

## 2015-06-15 NOTE — Telephone Encounter (Signed)
I called and spoke with pt's daughter in law and informed her of lab results. I also spoke with her regarding phone note about having labs done at [facility]. I advised her to have Darryl speak with someone in re: to her having her labs done at [facility], and that we are able to send [facility] the orders for her labs, but he would have to coordinate with them when she is to have it drawn there. They are to contact us when it is time for her to have labs done.

## 2015-07-13 ENCOUNTER — Encounter: Payer: Self-pay | Admitting: Internal Medicine

## 2015-07-13 ENCOUNTER — Ambulatory Visit (INDEPENDENT_AMBULATORY_CARE_PROVIDER_SITE_OTHER): Payer: Medicare Other | Admitting: Internal Medicine

## 2015-07-13 VITALS — BP 132/72 | HR 72 | Ht 67.0 in | Wt 144.0 lb

## 2015-07-13 DIAGNOSIS — J449 Chronic obstructive pulmonary disease, unspecified: Secondary | ICD-10-CM

## 2015-07-13 MED ORDER — TIOTROPIUM BROMIDE MONOHYDRATE 2.5 MCG/ACT IN AERS
2.0000 | INHALATION_SPRAY | Freq: Every day | RESPIRATORY_TRACT | Status: DC
Start: 2015-07-13 — End: 2016-07-16

## 2015-07-13 NOTE — Progress Notes (Signed)
Subjective:     Patient ID: [name], female   DOB: 07-29-1929, 82 y.o.   MRN: [id number]  HPI    #Acute Respiratory Failure #Chronic Respiratory Failure / COPD - June 2010 - intubated with acinetobacter (resistant) lower lobe pneumonia following ankle fracture  - Admited 04/21/12 - 05/05/12 - Rx Bipap. Discharged on nocturnal NIPPV. In hospital PCO2 71  #Tobacco Abuse  - quit Oct 2013 post admission  #. Acute on Chronic Diastolic CHF  - EF 55-60% by echo 04/22/12  #Cachexia - Weight 150-->143lbs   - weight 137# on 01/08/2013 with Body mass index is 22.25 kg/(m^2).    # Atrial Fibrillation w/ RVR  - H/o PAF. Not anticoagulation candidate due to h/o AVM/GIB, dementia, and fall risk  - Conservative therapy with rate control   #Other issues   -  Diabetes Mellitus Type 2, Anemia of chronic disease  #Lives in [facility] - Memory Care Unit - since summer 2014      OV 07/13/2014  Chief Complaint  Patient presents with  . Follow-up    Pt stated her breathing has improved since last OV. Pt Pt c/o DOE. Pt denies CP/tightness and significiant cough.     FU COPD NOS, Sleep apnea - unable to use cpap,demenita 0 livess in memory care unit   This is a 9 month follow-up for COPD not otherwise specified. She is now only on Spiriva once daily. Xopenex as needed. In the past 9 months she has had no admissions for COPD or any other medical problems. No new medical diagnoses. No emergency room visits or urgent care visits or hospitalizations. She most recently followed up with primary care physician in December 2015 and deemed stable. In terms of her dementia that is also stable. COPD stable. COPD cat score which is more accurate today is 11 and his reported baseline. Her son is with her along with? Daughter-in-law. They report being up-to-date with vaccines   OV 07/13/2015  Chief Complaint  Patient presents with  . Follow-up    Occ SOB denies any wheezing cp/ tightness.     82 year old female with COPD not otherwise specified. Dementia lives in a memory care unit. This a 1 year follow-up. Presents with her son and? Daughter-in-law. The last 1 and no admissions or hospitalizations or prednisone or combinations of COPD. No new medical diagnoses. The main issue is that the dementia has progressed. Mostly she does not recognize the sun. She has occasional periods of incontinence. She uses a walker and ambulates. COPD cat score is 19 and is filled with a son but I'm not sure if this is valid other than to give [place] a sense of her symptoms. They're up-to-date with vaccines. She is currently on Spiriva inhaler which they say that they're able to administer successfully. It is an MDI format. She is a Xopenex as needed. Apparently it is easier for the facility to give her the inhaler as opposed to  nebulizers.  CAT COPD Symptom & Quality of Life Score (GSK trademark) 0 is no burden. 5 is highest burden 07/13/2015 Filled by famil  Never Cough -> Cough all the time 0  No phlegm in chest -> Chest is full of phlegm 0  No chest tightness -> Chest feels very tight 1  No dyspnea for 1 flight stairs/hill -> Very dyspneic for 1 flight of stairs 5  No limitations for ADL at home -> Very limited with ADL at home 5  Confident leaving home ->  Not at all confident leaving home 5  Sleep soundly -> Do not sleep soundly because of lung condition 0  Lots of Energy -> No energy at all 3  TOTAL Score (max 40)  19    Current outpatient prescriptions:  .  acetaminophen (TYLENOL) 325 MG tablet, Take 650 mg by mouth every 4 (four) hours as needed for mild pain or headache., Disp: , Rfl:  .  aspirin 81 MG chewable tablet, Chew 81 mg by mouth daily., Disp: , Rfl:  .  atorvastatin (LIPITOR) 10 MG tablet, Take 1 tablet (10 mg total) by mouth daily., Disp: 90 tablet, Rfl: 3 .  budesonide (PULMICORT FLEXHALER) 180 MCG/ACT inhaler, Inhale 1 puff into the lungs 2 (two) times daily., Disp: 1 Inhaler, Rfl:  6 .  Cholecalciferol (VITAMIN D-3) 1000 UNITS CAPS, Take 1,000 Units by mouth daily. , Disp: , Rfl:  .  docusate sodium (COLACE) 100 MG capsule, Take 100 mg by mouth 2 (two) times daily. , Disp: , Rfl:  .  donepezil (ARICEPT) 10 MG tablet, Take 10 mg by mouth daily. , Disp: , Rfl:  .  fluticasone (FLONASE) 50 MCG/ACT nasal spray, Place 2 sprays into the nose daily. , Disp: , Rfl:  .  hydrALAZINE (APRESOLINE) 25 MG tablet, Take 1 tablet (25 mg total) by mouth 2 (two) times daily., Disp: , Rfl:  .  lansoprazole (PREVACID) 15 MG capsule, Take 1 capsule (15 mg total) by mouth daily., Disp: 90 capsule, Rfl: 1 .  levalbuterol (XOPENEX HFA) 45 MCG/ACT inhaler, Inhale 1-2 puffs into the lungs every 4 (four) hours as needed for wheezing or shortness of breath. , Disp: , Rfl:  .  magnesium hydroxide (MILK OF MAGNESIA) 400 MG/5ML suspension, Take 15 mLs by mouth daily as needed for constipation., Disp: , Rfl:  .  metFORMIN (GLUCOPHAGE) 1000 MG tablet, Take 1 tablet (1,000 mg total) by mouth 2 (two) times daily with a meal., Disp: 60 tablet, Rfl: 5 .  mirtazapine (REMERON) 15 MG tablet, Take 15 mg by mouth at bedtime., Disp: , Rfl:  .  polyethylene glycol (MIRALAX / GLYCOLAX) packet, Take 17 g by mouth daily as needed (constipation)., Disp: , Rfl:  .  tiotropium (SPIRIVA HANDIHALER) 18 MCG inhalation capsule, Place 1 capsule (18 mcg total) into inhaler and inhale daily., Disp: 30 capsule, Rfl: 5 .  vitamin B-12 (CYANOCOBALAMIN) 1000 MCG tablet, Take 1,000 mcg by mouth every morning. , Disp: , Rfl:   Immunization History  Administered Date(s) Administered  . Influenza Split 04/22/2012, 07/13/2012, 03/31/2014  . Influenza Whole 05/08/2007, 03/28/2008, 03/28/2010  . Influenza-Unspecified 04/15/2015  . PPD Test 11/17/2012  . Pneumococcal Conjugate-13 09/16/2013  . Pneumococcal Polysaccharide-23 05/26/2006, 04/22/2012  . Td 09/13/2003  . Zoster 07/01/2006    Allergies  Allergen Reactions  . Azithromycin  Other (See Comments)    inc blood sugar  . Codeine Itching  . Coumadin [Warfarin] Other (See Comments)    Causes bleeding  . Oxybutynin Chloride Er Other (See Comments)    Constipation/ dizziness/ dry mouth       Review of Systems     Objective:   Physical Exam   Filed Vitals:   07/13/15 0956  BP: 132/72  Pulse: 72  Height: 5\' 7"  (1.702 m)  Weight: 144 lb (65.318 kg)  SpO2: 92%   Elderly female seated in the chair looks very deconditioned Neurologic: Alert . But not oriented. Able to answer some simple questions. Not sure she recognizes me Respiratory: Clear to  auscultation no wheezes no crackles Cardiovascular: Normal heart sounds Abdomen: Not examined Extremity no sinus no clubbing      Assessment:       ICD-9-CM ICD-10-CM   1. Chronic obstructive pulmonary disease, unspecified COPD, unspecified chronic bronchitis type 496 J44.9        Plan:      #COPD  use spiriva  per schedule but change mdi to respimat at 2 puff once daily  use xopenex mdi as neeeded Glad uptodate with respiratory vaccines  #Followup 12 months - the family prefers this REturn sooner if needed  Dr. Kalman Shan, M.D., [facility].C.P Pulmonary and Critical Care Medicine Staff Physician [facility] Pager: [phone], If no answer or between  15:00h - 7:00h: call [phone]  07/13/2015 10:27 AM

## 2015-07-13 NOTE — Patient Instructions (Signed)
ICD-9-CM ICD-10-CM   1. Chronic obstructive pulmonary disease, unspecified COPD, unspecified chronic bronchitis type 496 J44.9    #COPD  use spiriva  per schedule but change mdi to respimat at 2 puff once daily  use xopenex mdi as neeeded Glad uptodate with respiratory vaccines  #Followup 12 months REturn sooner if needed

## 2015-08-14 DIAGNOSIS — M79673 Pain in unspecified foot: Secondary | ICD-10-CM | POA: Diagnosis not present

## 2015-08-14 DIAGNOSIS — B351 Tinea unguium: Secondary | ICD-10-CM | POA: Diagnosis not present

## 2015-08-14 DIAGNOSIS — L851 Acquired keratosis [keratoderma] palmaris et plantaris: Secondary | ICD-10-CM | POA: Diagnosis not present

## 2015-08-14 DIAGNOSIS — B070 Plantar wart: Secondary | ICD-10-CM | POA: Diagnosis not present

## 2015-08-21 ENCOUNTER — Encounter: Payer: Self-pay | Admitting: Family Medicine

## 2015-08-21 ENCOUNTER — Ambulatory Visit (INDEPENDENT_AMBULATORY_CARE_PROVIDER_SITE_OTHER): Payer: Medicare Other | Admitting: Family Medicine

## 2015-08-21 ENCOUNTER — Ambulatory Visit (INDEPENDENT_AMBULATORY_CARE_PROVIDER_SITE_OTHER)
Admission: RE | Admit: 2015-08-21 | Discharge: 2015-08-21 | Disposition: A | Discharge status: Home / Self Care | Payer: Medicare Other | Source: Ambulatory Visit | Attending: Family Medicine | Admitting: Family Medicine

## 2015-08-21 VITALS — BP 118/68 | HR 65 | Temp 98.0°F | Ht 62.0 in | Wt 139.0 lb

## 2015-08-21 DIAGNOSIS — M545 Low back pain, unspecified: Secondary | ICD-10-CM

## 2015-08-21 DIAGNOSIS — M5136 Other intervertebral disc degeneration, lumbar region: Secondary | ICD-10-CM | POA: Diagnosis not present

## 2015-08-21 LAB — POC URINALSYSI DIPSTICK (AUTOMATED)
Bilirubin, UA: NEGATIVE
Blood, UA: NEGATIVE
GLUCOSE UA: NEGATIVE
KETONES UA: NEGATIVE
LEUKOCYTES UA: NEGATIVE
Nitrite, UA: NEGATIVE
PROTEIN UA: NEGATIVE
Urobilinogen, UA: 0.2
pH, UA: 6

## 2015-08-21 NOTE — Patient Instructions (Signed)
Try to encourage walking whenever possible  Also socialization  Limit time in bed Lumbar xray today  ua today if possible  Tylenol 650 mg every 4 hours as needed for pain is fine   Please keep me posted

## 2015-08-21 NOTE — Progress Notes (Signed)
Subjective:    Patient ID: [name], female    DOB: 03-31-1930, 82 y.o.   MRN: [id number]  HPI Here for back pain   Her residence noted she is having trouble sitting due to back   Comes and goes per pt   Family went to visit last week-she c/o back pain - was able to get up to go to lunch  Thinks she is in bed too much  Wonders about skin   No urinary changes that they know of   One day she refused to eat breakfast  Enc her to eat lunch that day  Appetite otherwise wt is stable   Wt is down 4 lb  bmi of 25 today  ? If she not wanting to get out of bed    Back hx - as a child she fell off of some bleachers and hurt her back and ?hip   Mood-stable - stays a lot to herself -she is isolating herself more  Has dementia Worse in the am  Walks for 10 minutes in the afternoon   Of note- a lot of staff changes lately-that may impact her more also  Staff is good overall- but some of the upper management has changed - family does not always get informed   No recent falls   Patient Active Problem List   Diagnosis Date Noted  . Chronic obstructive pulmonary disease, unspecified COPD, unspecified chronic bronchitis type 07/13/2015  . Encounter for Medicare annual wellness exam 12/12/2014  . Routine general medical examination at a health care facility 12/12/2014  . Abnormal urine odor 12/12/2014  . Syncope 08/26/2013  . Bradycardia 08/26/2013  . Hip pain 06/30/2013  . Fall at nursing home 06/30/2013  . Near syncope 09/30/2012  . Headache(784.0) 09/30/2012  . Failure to thrive in adult 09/25/2012  . Depression 09/25/2012  . Abdominal pain, lower 09/25/2012  . Vitamin B12 deficiency 09/08/2012  . Hypertrophic toenail 07/21/2012  . OSA (obstructive sleep apnea) 07/15/2012  . PE (pulmonary embolism) 06/17/2012  . Chest pain 06/16/2012  . Esophageal dysmotility suspected 06/15/2012  . GERD (gastroesophageal reflux disease) 06/15/2012  . Acute and chronic respiratory  failure 04/23/2012  . Dementia 04/21/2012  . Chronic diastolic heart failure (HCC) 04/21/2012  . COPD (chronic obstructive pulmonary disease) (HCC) 04/21/2012  . Mixed incontinence urge and stress 01/04/2011  . Vitamin D deficiency 07/14/2010  . ANEMIA 01/24/2009  . Former smoker 11/04/2007  . MORTON'S NEUROMA 11/03/2007  . MACULAR DEGENERATION 11/03/2007  . Allergic rhinitis 11/03/2007  . Osteoporosis 11/03/2007  . Diabetes type 2, controlled (HCC) 11/03/2007  . HYPERCHOLESTEROLEMIA 11/25/2006   Past Medical History  Diagnosis Date  . Allergy     allergic Rhintis  . Hyperlipidemia   . Hypertension   . Osteoporosis   . PAF (paroxysmal atrial fibrillation) (HCC)     a. PAF dates back > 5 yrs, prev on coumadin->d/c 2006 2/2 GIB;   . Carotid stenosis     a. 09/2007: Carotid Doppler stable- stable bilateral stenosis 40-59% (from 06)  . Type II diabetes mellitus (HCC)   . Tobacco abuse     a. ongoing - 50+ pack yr hx.  . Dementia     Dementia in Hospital //Does O.K. at home.  . Diverticulosis   . GI bleeding     a. when on coumadin in 2006 - colonoscopy @ that time showed 2 large right colon avm's, multiple small bowel avm's, fresh blood in prox small bowel.  [name]  AVM (arteriovenous malformation) of colon     a. 2006 colonscopy  . Chronic respiratory failure (HCC)   . COPD (chronic obstructive pulmonary disease) (HCC)   . Chronic diastolic CHF (congestive heart failure) (HCC)   . Normocytic anemia 03/2012  . Complication of anesthesia   . PONV (postoperative nausea and vomiting)   . GERD (gastroesophageal reflux disease)   . Pulmonary embolism (HCC)     SP IVC filter   Past Surgical History  Procedure Laterality Date  . Cardiac catheterization  1990's    clear  . Orif tibia & fibula fractures  05/2004  . Small bowel enteroscopy  03/2005    AVMS  . Colonoscopy  08/2004  . Esophagogastroduodenoscopy  08/2004    Multiple    Social History  Substance Use Topics  . Smoking  status: Former Smoker -- 0.50 packs/day for 50 years    Types: Cigarettes    Quit date: 04/21/2012  . Smokeless tobacco: Never Used  . Alcohol Use: No   Family History  Problem Relation Age of Onset  . Stroke Mother     a. believes she died suddenly @ 61.  . Diabetes Mother   . Stroke Father     Died from cerebral hemorrhage @ 29  . Diabetes Father   . Cancer Sister     colon and stomach cancer  . Diabetes Brother   . Heart disease Brother   . Cancer Brother     died from lung cancer  . Heart disease Brother     MI  . Cancer Sister     ? liver//brain cancer  . Heart disease Sister   . Diabetes Sister     diabetes and glaucoma  . Cancer Brother     pancreatic cancer  . Diabetes Son    Allergies  Allergen Reactions  . Azithromycin Other (See Comments)    inc blood sugar  . Codeine Itching  . Coumadin [Warfarin] Other (See Comments)    Causes bleeding  . Oxybutynin Chloride Er Other (See Comments)    Constipation/ dizziness/ dry mouth   Current Outpatient Prescriptions on File Prior to Visit  Medication Sig Dispense Refill  . acetaminophen (TYLENOL) 325 MG tablet Take 650 mg by mouth every 4 (four) hours as needed for mild pain or headache.    [name] aspirin 81 MG chewable tablet Chew 81 mg by mouth daily.    [name] atorvastatin (LIPITOR) 10 MG tablet Take 1 tablet (10 mg total) by mouth daily. 90 tablet 3  . budesonide (PULMICORT FLEXHALER) 180 MCG/ACT inhaler Inhale 1 puff into the lungs 2 (two) times daily. 1 Inhaler 6  . Cholecalciferol (VITAMIN D-3) 1000 UNITS CAPS Take 1,000 Units by mouth daily.     [name] docusate sodium (COLACE) 100 MG capsule Take 100 mg by mouth 2 (two) times daily.     [name] donepezil (ARICEPT) 10 MG tablet Take 10 mg by mouth daily.     . fluticasone (FLONASE) 50 MCG/ACT nasal spray Place 2 sprays into the nose daily.     . hydrALAZINE (APRESOLINE) 25 MG tablet Take 1 tablet (25 mg total) by mouth 2 (two) times daily.    . lansoprazole (PREVACID) 15 MG  capsule Take 1 capsule (15 mg total) by mouth daily. 90 capsule 1  . levalbuterol (XOPENEX HFA) 45 MCG/ACT inhaler Inhale 1-2 puffs into the lungs every 4 (four) hours as needed for wheezing or shortness of breath.     . magnesium hydroxide (MILK  OF MAGNESIA) 400 MG/5ML suspension Take 15 mLs by mouth daily as needed for constipation.    . metFORMIN (GLUCOPHAGE) 1000 MG tablet Take 1 tablet (1,000 mg total) by mouth 2 (two) times daily with a meal. 60 tablet 5  . mirtazapine (REMERON) 15 MG tablet Take 15 mg by mouth at bedtime.    . polyethylene glycol (MIRALAX / GLYCOLAX) packet Take 17 g by mouth daily as needed (constipation).    [name] tiotropium (SPIRIVA HANDIHALER) 18 MCG inhalation capsule Place 1 capsule (18 mcg total) into inhaler and inhale daily. 30 capsule 5  . Tiotropium Bromide Monohydrate (SPIRIVA RESPIMAT) 2.5 MCG/ACT AERS Inhale 2 puffs into the lungs daily. 4 g 11  . vitamin B-12 (CYANOCOBALAMIN) 1000 MCG tablet Take 1,000 mcg by mouth every morning.      No current facility-administered medications on file prior to visit.    Review of Systems Review of Systems  Constitutional: Negative for fever, appetite change, fatigue and unexpected weight change.  Eyes: Negative for pain and visual disturbance.  Respiratory: Negative for cough and shortness of breath.   Cardiovascular: Negative for cp or palpitations    Gastrointestinal: Negative for nausea, diarrhea and constipation.  Genitourinary: Negative for urgency and frequency. neg for dysuria  MSK pos for low back pain/ neg for radicular symptoms  Skin: Negative for pallor or rash   Neurological: Negative for weakness, light-headedness, numbness and headaches.  Hematological: Negative for adenopathy. Does not bruise/bleed easily.  Psychiatric/Behavioral: Negative for dysphoric mood. The patient is not nervous/anxious.         Objective:   Physical Exam  Constitutional: She appears well-developed and well-nourished. No  distress.  Frail appearing elderly female -baseline  HENT:  Head: Normocephalic and atraumatic.  Eyes: Conjunctivae and EOM are normal. Pupils are equal, round, and reactive to light. No scleral icterus.  Neck: Normal range of motion. Neck supple.  Cardiovascular: Normal rate and regular rhythm.   Pulmonary/Chest: Effort normal and breath sounds normal.  Abdominal: Soft. Bowel sounds are normal. She exhibits no distension and no mass. There is no tenderness. There is no rebound and no guarding.  Musculoskeletal: She exhibits tenderness. She exhibits no edema.       Lumbar back: She exhibits tenderness and spasm. She exhibits normal range of motion, no bony tenderness, no edema and no deformity.  Mild tenderness in L lumbar musculature and piriformis area  Baseline gait No neuro signs     Lymphadenopathy:    She has no cervical adenopathy.  Neurological: She is alert. She has normal strength and normal reflexes. She displays no atrophy. No cranial nerve deficit or sensory deficit. She exhibits normal muscle tone. Coordination normal.  Gait is wide based and slow with walker  Neg SLR LE strength is symmetric     Skin: Skin is warm and dry. No rash noted. No erythema.  Psychiatric:  Baseline dementia Answers question appropriately  Follows directions           Assessment & Plan:   Problem List Items Addressed This Visit      Other   Low back pain - Primary    Intermittent Worse with sitting  No trauma Tender in L lumbar musculature No pressure ulcers  xr today ua neg  Px - order for prn acetaminophen for residence Enc more ambulation with walker-less bed time        Relevant Orders   DG Lumbar Spine Complete   POCT Urinalysis Dipstick (Automated) (Completed)

## 2015-08-21 NOTE — Progress Notes (Signed)
Pre visit review using our clinic review tool, if applicable. No additional management support is needed unless otherwise documented below in the visit note. 

## 2015-08-21 NOTE — Assessment & Plan Note (Signed)
Intermittent Worse with sitting  No trauma Tender in L lumbar musculature No pressure ulcers  xr today ua neg  Px - order for prn acetaminophen for residence Enc more ambulation with walker-less bed time

## 2015-08-23 ENCOUNTER — Telehealth: Payer: Self-pay | Admitting: Family Medicine

## 2015-08-23 NOTE — Telephone Encounter (Signed)
[facility] called to let Dr.Tower know they won't be able to see patient until Friday.

## 2015-08-23 NOTE — Telephone Encounter (Signed)
I assume they mean PT -thanks for letting me know, I am aware  Make sure family is also aware please - they are not always good about communicating with them Thanks

## 2015-08-24 DIAGNOSIS — R2689 Other abnormalities of gait and mobility: Secondary | ICD-10-CM | POA: Diagnosis not present

## 2015-08-24 DIAGNOSIS — Z7984 Long term (current) use of oral hypoglycemic drugs: Secondary | ICD-10-CM | POA: Diagnosis not present

## 2015-08-24 DIAGNOSIS — Z9181 History of falling: Secondary | ICD-10-CM | POA: Diagnosis not present

## 2015-08-24 DIAGNOSIS — M545 Low back pain: Secondary | ICD-10-CM | POA: Diagnosis not present

## 2015-08-24 DIAGNOSIS — H3530 Unspecified macular degeneration: Secondary | ICD-10-CM | POA: Diagnosis not present

## 2015-08-24 DIAGNOSIS — I4891 Unspecified atrial fibrillation: Secondary | ICD-10-CM | POA: Diagnosis not present

## 2015-08-24 DIAGNOSIS — M810 Age-related osteoporosis without current pathological fracture: Secondary | ICD-10-CM | POA: Diagnosis not present

## 2015-08-24 DIAGNOSIS — I5032 Chronic diastolic (congestive) heart failure: Secondary | ICD-10-CM | POA: Diagnosis not present

## 2015-08-24 DIAGNOSIS — E119 Type 2 diabetes mellitus without complications: Secondary | ICD-10-CM | POA: Diagnosis not present

## 2015-08-24 DIAGNOSIS — J449 Chronic obstructive pulmonary disease, unspecified: Secondary | ICD-10-CM | POA: Diagnosis not present

## 2015-08-24 DIAGNOSIS — I110 Hypertensive heart disease with heart failure: Secondary | ICD-10-CM | POA: Diagnosis not present

## 2015-08-25 ENCOUNTER — Telehealth: Payer: Self-pay

## 2015-08-25 NOTE — Telephone Encounter (Signed)
HH PT at [facility] advised.

## 2015-08-25 NOTE — Telephone Encounter (Signed)
[facility] PT left v/m requesting verbal orders for home health PT 1 x a week for 1 week; 2 x a week for 4 weeks and 1 x a week for 3 weeks for balance and gait strengthening.

## 2015-08-25 NOTE — Telephone Encounter (Signed)
Please ok those

## 2015-08-29 DIAGNOSIS — M810 Age-related osteoporosis without current pathological fracture: Secondary | ICD-10-CM | POA: Diagnosis not present

## 2015-08-29 DIAGNOSIS — I110 Hypertensive heart disease with heart failure: Secondary | ICD-10-CM | POA: Diagnosis not present

## 2015-08-29 DIAGNOSIS — R2689 Other abnormalities of gait and mobility: Secondary | ICD-10-CM | POA: Diagnosis not present

## 2015-08-29 DIAGNOSIS — Z7984 Long term (current) use of oral hypoglycemic drugs: Secondary | ICD-10-CM | POA: Diagnosis not present

## 2015-08-29 DIAGNOSIS — H3530 Unspecified macular degeneration: Secondary | ICD-10-CM | POA: Diagnosis not present

## 2015-08-29 DIAGNOSIS — I5032 Chronic diastolic (congestive) heart failure: Secondary | ICD-10-CM | POA: Diagnosis not present

## 2015-08-29 DIAGNOSIS — M545 Low back pain: Secondary | ICD-10-CM | POA: Diagnosis not present

## 2015-08-29 DIAGNOSIS — J449 Chronic obstructive pulmonary disease, unspecified: Secondary | ICD-10-CM | POA: Diagnosis not present

## 2015-08-29 DIAGNOSIS — Z9181 History of falling: Secondary | ICD-10-CM | POA: Diagnosis not present

## 2015-08-29 DIAGNOSIS — E119 Type 2 diabetes mellitus without complications: Secondary | ICD-10-CM | POA: Diagnosis not present

## 2015-08-29 DIAGNOSIS — I4891 Unspecified atrial fibrillation: Secondary | ICD-10-CM | POA: Diagnosis not present

## 2015-08-31 ENCOUNTER — Telehealth: Payer: Self-pay

## 2015-08-31 DIAGNOSIS — Z7984 Long term (current) use of oral hypoglycemic drugs: Secondary | ICD-10-CM | POA: Diagnosis not present

## 2015-08-31 DIAGNOSIS — I110 Hypertensive heart disease with heart failure: Secondary | ICD-10-CM | POA: Diagnosis not present

## 2015-08-31 DIAGNOSIS — M545 Low back pain: Secondary | ICD-10-CM | POA: Diagnosis not present

## 2015-08-31 DIAGNOSIS — I4891 Unspecified atrial fibrillation: Secondary | ICD-10-CM | POA: Diagnosis not present

## 2015-08-31 DIAGNOSIS — Z9181 History of falling: Secondary | ICD-10-CM | POA: Diagnosis not present

## 2015-08-31 DIAGNOSIS — H3530 Unspecified macular degeneration: Secondary | ICD-10-CM | POA: Diagnosis not present

## 2015-08-31 DIAGNOSIS — I5032 Chronic diastolic (congestive) heart failure: Secondary | ICD-10-CM | POA: Diagnosis not present

## 2015-08-31 DIAGNOSIS — R2689 Other abnormalities of gait and mobility: Secondary | ICD-10-CM | POA: Diagnosis not present

## 2015-08-31 DIAGNOSIS — J449 Chronic obstructive pulmonary disease, unspecified: Secondary | ICD-10-CM | POA: Diagnosis not present

## 2015-08-31 DIAGNOSIS — E119 Type 2 diabetes mellitus without complications: Secondary | ICD-10-CM | POA: Diagnosis not present

## 2015-08-31 DIAGNOSIS — M810 Age-related osteoporosis without current pathological fracture: Secondary | ICD-10-CM | POA: Diagnosis not present

## 2015-08-31 NOTE — Telephone Encounter (Signed)
[name] Pt with [facility] left v/m; pts BP today was 102/58 sitting; pt was not dizzy today. Pt did report dizziness on and off when standing. [name] wanted Dr Milinda Antis to be aware. FYI to Dr Milinda Antis.

## 2015-08-31 NOTE — Telephone Encounter (Signed)
That bp is ok if she is not dizzy.  I was not aware she was dizzy Please place an order to hold her hydralazine Thanks Remove from med list  Alert me if bp goes up over 140/90

## 2015-09-01 ENCOUNTER — Telehealth: Payer: Self-pay | Admitting: Family Medicine

## 2015-09-01 NOTE — Telephone Encounter (Signed)
Gunnar Fusiaula called to say her mom is in nursing home and they faxed over paperwork for dr tower and have not heard back from dr tower She wanted to make sure you received the paperwork

## 2015-09-01 NOTE — Telephone Encounter (Signed)
Just got it - regarding parameters for bp  Continue holding hydralazine  Call for bp under 90/50 (or symptoms) or over 140/90 Thanks  Paper is in IN box

## 2015-09-01 NOTE — Telephone Encounter (Signed)
Patel notified of Dr. Royden Purlower's comments/instructions and verbalized understanding, I also faxed orders with [facility] to [facility] so they have in on file

## 2015-09-01 NOTE — Telephone Encounter (Signed)
Form faxed back to [facility]

## 2015-09-04 DIAGNOSIS — I4891 Unspecified atrial fibrillation: Secondary | ICD-10-CM | POA: Diagnosis not present

## 2015-09-04 DIAGNOSIS — J449 Chronic obstructive pulmonary disease, unspecified: Secondary | ICD-10-CM | POA: Diagnosis not present

## 2015-09-04 DIAGNOSIS — Z7984 Long term (current) use of oral hypoglycemic drugs: Secondary | ICD-10-CM | POA: Diagnosis not present

## 2015-09-04 DIAGNOSIS — M545 Low back pain: Secondary | ICD-10-CM | POA: Diagnosis not present

## 2015-09-04 DIAGNOSIS — E119 Type 2 diabetes mellitus without complications: Secondary | ICD-10-CM | POA: Diagnosis not present

## 2015-09-04 DIAGNOSIS — H3530 Unspecified macular degeneration: Secondary | ICD-10-CM | POA: Diagnosis not present

## 2015-09-04 DIAGNOSIS — I110 Hypertensive heart disease with heart failure: Secondary | ICD-10-CM | POA: Diagnosis not present

## 2015-09-04 DIAGNOSIS — I5032 Chronic diastolic (congestive) heart failure: Secondary | ICD-10-CM | POA: Diagnosis not present

## 2015-09-04 DIAGNOSIS — Z9181 History of falling: Secondary | ICD-10-CM | POA: Diagnosis not present

## 2015-09-04 DIAGNOSIS — M810 Age-related osteoporosis without current pathological fracture: Secondary | ICD-10-CM | POA: Diagnosis not present

## 2015-09-04 DIAGNOSIS — R2689 Other abnormalities of gait and mobility: Secondary | ICD-10-CM | POA: Diagnosis not present

## 2015-09-07 DIAGNOSIS — R2689 Other abnormalities of gait and mobility: Secondary | ICD-10-CM | POA: Diagnosis not present

## 2015-09-07 DIAGNOSIS — E119 Type 2 diabetes mellitus without complications: Secondary | ICD-10-CM | POA: Diagnosis not present

## 2015-09-07 DIAGNOSIS — I5032 Chronic diastolic (congestive) heart failure: Secondary | ICD-10-CM | POA: Diagnosis not present

## 2015-09-07 DIAGNOSIS — J449 Chronic obstructive pulmonary disease, unspecified: Secondary | ICD-10-CM | POA: Diagnosis not present

## 2015-09-07 DIAGNOSIS — Z9181 History of falling: Secondary | ICD-10-CM | POA: Diagnosis not present

## 2015-09-07 DIAGNOSIS — M545 Low back pain: Secondary | ICD-10-CM | POA: Diagnosis not present

## 2015-09-07 DIAGNOSIS — M810 Age-related osteoporosis without current pathological fracture: Secondary | ICD-10-CM | POA: Diagnosis not present

## 2015-09-07 DIAGNOSIS — I110 Hypertensive heart disease with heart failure: Secondary | ICD-10-CM | POA: Diagnosis not present

## 2015-09-07 DIAGNOSIS — I4891 Unspecified atrial fibrillation: Secondary | ICD-10-CM | POA: Diagnosis not present

## 2015-09-07 DIAGNOSIS — H3530 Unspecified macular degeneration: Secondary | ICD-10-CM | POA: Diagnosis not present

## 2015-09-07 DIAGNOSIS — Z7984 Long term (current) use of oral hypoglycemic drugs: Secondary | ICD-10-CM | POA: Diagnosis not present

## 2015-09-11 DIAGNOSIS — E119 Type 2 diabetes mellitus without complications: Secondary | ICD-10-CM | POA: Diagnosis not present

## 2015-09-11 DIAGNOSIS — Z7984 Long term (current) use of oral hypoglycemic drugs: Secondary | ICD-10-CM | POA: Diagnosis not present

## 2015-09-11 DIAGNOSIS — I110 Hypertensive heart disease with heart failure: Secondary | ICD-10-CM | POA: Diagnosis not present

## 2015-09-11 DIAGNOSIS — R2689 Other abnormalities of gait and mobility: Secondary | ICD-10-CM | POA: Diagnosis not present

## 2015-09-11 DIAGNOSIS — M810 Age-related osteoporosis without current pathological fracture: Secondary | ICD-10-CM | POA: Diagnosis not present

## 2015-09-11 DIAGNOSIS — M545 Low back pain: Secondary | ICD-10-CM | POA: Diagnosis not present

## 2015-09-11 DIAGNOSIS — I4891 Unspecified atrial fibrillation: Secondary | ICD-10-CM | POA: Diagnosis not present

## 2015-09-11 DIAGNOSIS — J449 Chronic obstructive pulmonary disease, unspecified: Secondary | ICD-10-CM | POA: Diagnosis not present

## 2015-09-11 DIAGNOSIS — H3530 Unspecified macular degeneration: Secondary | ICD-10-CM | POA: Diagnosis not present

## 2015-09-11 DIAGNOSIS — Z9181 History of falling: Secondary | ICD-10-CM | POA: Diagnosis not present

## 2015-09-11 DIAGNOSIS — I5032 Chronic diastolic (congestive) heart failure: Secondary | ICD-10-CM | POA: Diagnosis not present

## 2015-09-13 DIAGNOSIS — R2689 Other abnormalities of gait and mobility: Secondary | ICD-10-CM | POA: Diagnosis not present

## 2015-09-13 DIAGNOSIS — I5032 Chronic diastolic (congestive) heart failure: Secondary | ICD-10-CM | POA: Diagnosis not present

## 2015-09-13 DIAGNOSIS — I110 Hypertensive heart disease with heart failure: Secondary | ICD-10-CM | POA: Diagnosis not present

## 2015-09-13 DIAGNOSIS — M545 Low back pain: Secondary | ICD-10-CM | POA: Diagnosis not present

## 2015-09-14 DIAGNOSIS — Z7984 Long term (current) use of oral hypoglycemic drugs: Secondary | ICD-10-CM | POA: Diagnosis not present

## 2015-09-14 DIAGNOSIS — I110 Hypertensive heart disease with heart failure: Secondary | ICD-10-CM | POA: Diagnosis not present

## 2015-09-14 DIAGNOSIS — M545 Low back pain: Secondary | ICD-10-CM | POA: Diagnosis not present

## 2015-09-14 DIAGNOSIS — Z9181 History of falling: Secondary | ICD-10-CM | POA: Diagnosis not present

## 2015-09-14 DIAGNOSIS — J449 Chronic obstructive pulmonary disease, unspecified: Secondary | ICD-10-CM | POA: Diagnosis not present

## 2015-09-14 DIAGNOSIS — I4891 Unspecified atrial fibrillation: Secondary | ICD-10-CM | POA: Diagnosis not present

## 2015-09-14 DIAGNOSIS — M810 Age-related osteoporosis without current pathological fracture: Secondary | ICD-10-CM | POA: Diagnosis not present

## 2015-09-14 DIAGNOSIS — I5032 Chronic diastolic (congestive) heart failure: Secondary | ICD-10-CM | POA: Diagnosis not present

## 2015-09-14 DIAGNOSIS — E119 Type 2 diabetes mellitus without complications: Secondary | ICD-10-CM | POA: Diagnosis not present

## 2015-09-14 DIAGNOSIS — R2689 Other abnormalities of gait and mobility: Secondary | ICD-10-CM | POA: Diagnosis not present

## 2015-09-14 DIAGNOSIS — H3530 Unspecified macular degeneration: Secondary | ICD-10-CM | POA: Diagnosis not present

## 2015-09-18 DIAGNOSIS — Z9181 History of falling: Secondary | ICD-10-CM | POA: Diagnosis not present

## 2015-09-18 DIAGNOSIS — I110 Hypertensive heart disease with heart failure: Secondary | ICD-10-CM | POA: Diagnosis not present

## 2015-09-18 DIAGNOSIS — M545 Low back pain: Secondary | ICD-10-CM | POA: Diagnosis not present

## 2015-09-18 DIAGNOSIS — R2689 Other abnormalities of gait and mobility: Secondary | ICD-10-CM | POA: Diagnosis not present

## 2015-09-18 DIAGNOSIS — J449 Chronic obstructive pulmonary disease, unspecified: Secondary | ICD-10-CM | POA: Diagnosis not present

## 2015-09-18 DIAGNOSIS — I4891 Unspecified atrial fibrillation: Secondary | ICD-10-CM | POA: Diagnosis not present

## 2015-09-18 DIAGNOSIS — E119 Type 2 diabetes mellitus without complications: Secondary | ICD-10-CM | POA: Diagnosis not present

## 2015-09-18 DIAGNOSIS — M810 Age-related osteoporosis without current pathological fracture: Secondary | ICD-10-CM | POA: Diagnosis not present

## 2015-09-18 DIAGNOSIS — I5032 Chronic diastolic (congestive) heart failure: Secondary | ICD-10-CM | POA: Diagnosis not present

## 2015-09-18 DIAGNOSIS — H3530 Unspecified macular degeneration: Secondary | ICD-10-CM | POA: Diagnosis not present

## 2015-09-18 DIAGNOSIS — Z7984 Long term (current) use of oral hypoglycemic drugs: Secondary | ICD-10-CM | POA: Diagnosis not present

## 2015-09-22 ENCOUNTER — Telehealth: Payer: Self-pay | Admitting: Family Medicine

## 2015-09-22 DIAGNOSIS — I4891 Unspecified atrial fibrillation: Secondary | ICD-10-CM | POA: Diagnosis not present

## 2015-09-22 DIAGNOSIS — I110 Hypertensive heart disease with heart failure: Secondary | ICD-10-CM | POA: Diagnosis not present

## 2015-09-22 DIAGNOSIS — J449 Chronic obstructive pulmonary disease, unspecified: Secondary | ICD-10-CM | POA: Diagnosis not present

## 2015-09-22 DIAGNOSIS — M545 Low back pain: Secondary | ICD-10-CM | POA: Diagnosis not present

## 2015-09-22 DIAGNOSIS — R2689 Other abnormalities of gait and mobility: Secondary | ICD-10-CM | POA: Diagnosis not present

## 2015-09-22 DIAGNOSIS — E119 Type 2 diabetes mellitus without complications: Secondary | ICD-10-CM | POA: Diagnosis not present

## 2015-09-22 DIAGNOSIS — Z7984 Long term (current) use of oral hypoglycemic drugs: Secondary | ICD-10-CM | POA: Diagnosis not present

## 2015-09-22 DIAGNOSIS — I5032 Chronic diastolic (congestive) heart failure: Secondary | ICD-10-CM | POA: Diagnosis not present

## 2015-09-22 DIAGNOSIS — M810 Age-related osteoporosis without current pathological fracture: Secondary | ICD-10-CM | POA: Diagnosis not present

## 2015-09-22 DIAGNOSIS — Z9181 History of falling: Secondary | ICD-10-CM | POA: Diagnosis not present

## 2015-09-22 DIAGNOSIS — H3530 Unspecified macular degeneration: Secondary | ICD-10-CM | POA: Diagnosis not present

## 2015-09-22 NOTE — Telephone Encounter (Signed)
[name] from [facility] called to request orders fro occupational therapy home health eval. Please call backa t [phone]

## 2015-09-22 NOTE — Telephone Encounter (Signed)
Please ok that verbal order for therapy-thanks

## 2015-09-22 NOTE — Telephone Encounter (Signed)
Verbal order given to Ms. Allena KatzPatel

## 2015-09-27 DIAGNOSIS — I4891 Unspecified atrial fibrillation: Secondary | ICD-10-CM | POA: Diagnosis not present

## 2015-09-27 DIAGNOSIS — I110 Hypertensive heart disease with heart failure: Secondary | ICD-10-CM | POA: Diagnosis not present

## 2015-09-27 DIAGNOSIS — Z9181 History of falling: Secondary | ICD-10-CM | POA: Diagnosis not present

## 2015-09-27 DIAGNOSIS — I5032 Chronic diastolic (congestive) heart failure: Secondary | ICD-10-CM | POA: Diagnosis not present

## 2015-09-27 DIAGNOSIS — M545 Low back pain: Secondary | ICD-10-CM | POA: Diagnosis not present

## 2015-09-27 DIAGNOSIS — Z7984 Long term (current) use of oral hypoglycemic drugs: Secondary | ICD-10-CM | POA: Diagnosis not present

## 2015-09-27 DIAGNOSIS — J449 Chronic obstructive pulmonary disease, unspecified: Secondary | ICD-10-CM | POA: Diagnosis not present

## 2015-09-27 DIAGNOSIS — E119 Type 2 diabetes mellitus without complications: Secondary | ICD-10-CM | POA: Diagnosis not present

## 2015-09-27 DIAGNOSIS — M810 Age-related osteoporosis without current pathological fracture: Secondary | ICD-10-CM | POA: Diagnosis not present

## 2015-09-27 DIAGNOSIS — H3530 Unspecified macular degeneration: Secondary | ICD-10-CM | POA: Diagnosis not present

## 2015-09-27 DIAGNOSIS — R2689 Other abnormalities of gait and mobility: Secondary | ICD-10-CM | POA: Diagnosis not present

## 2015-10-05 DIAGNOSIS — Z9181 History of falling: Secondary | ICD-10-CM | POA: Diagnosis not present

## 2015-10-05 DIAGNOSIS — I4891 Unspecified atrial fibrillation: Secondary | ICD-10-CM | POA: Diagnosis not present

## 2015-10-05 DIAGNOSIS — E119 Type 2 diabetes mellitus without complications: Secondary | ICD-10-CM | POA: Diagnosis not present

## 2015-10-05 DIAGNOSIS — R2689 Other abnormalities of gait and mobility: Secondary | ICD-10-CM | POA: Diagnosis not present

## 2015-10-05 DIAGNOSIS — M545 Low back pain: Secondary | ICD-10-CM | POA: Diagnosis not present

## 2015-10-05 DIAGNOSIS — I110 Hypertensive heart disease with heart failure: Secondary | ICD-10-CM | POA: Diagnosis not present

## 2015-10-05 DIAGNOSIS — Z7984 Long term (current) use of oral hypoglycemic drugs: Secondary | ICD-10-CM | POA: Diagnosis not present

## 2015-10-05 DIAGNOSIS — H3530 Unspecified macular degeneration: Secondary | ICD-10-CM | POA: Diagnosis not present

## 2015-10-05 DIAGNOSIS — J449 Chronic obstructive pulmonary disease, unspecified: Secondary | ICD-10-CM | POA: Diagnosis not present

## 2015-10-05 DIAGNOSIS — I5032 Chronic diastolic (congestive) heart failure: Secondary | ICD-10-CM | POA: Diagnosis not present

## 2015-10-05 DIAGNOSIS — M810 Age-related osteoporosis without current pathological fracture: Secondary | ICD-10-CM | POA: Diagnosis not present

## 2015-10-14 ENCOUNTER — Encounter (HOSPITAL_COMMUNITY): Payer: Self-pay | Admitting: Emergency Medicine

## 2015-10-14 ENCOUNTER — Emergency Department (HOSPITAL_COMMUNITY)
Admission: EM | Admit: 2015-10-14 | Discharge: 2015-10-14 | Disposition: A | Discharge status: Home / Self Care | Payer: Medicare Other | Attending: Emergency Medicine | Admitting: Emergency Medicine

## 2015-10-14 ENCOUNTER — Emergency Department (HOSPITAL_COMMUNITY): Payer: Medicare Other

## 2015-10-14 DIAGNOSIS — Z7982 Long term (current) use of aspirin: Secondary | ICD-10-CM | POA: Diagnosis not present

## 2015-10-14 DIAGNOSIS — Z7984 Long term (current) use of oral hypoglycemic drugs: Secondary | ICD-10-CM | POA: Diagnosis not present

## 2015-10-14 DIAGNOSIS — S199XXA Unspecified injury of neck, initial encounter: Secondary | ICD-10-CM | POA: Diagnosis not present

## 2015-10-14 DIAGNOSIS — Z7951 Long term (current) use of inhaled steroids: Secondary | ICD-10-CM | POA: Insufficient documentation

## 2015-10-14 DIAGNOSIS — J449 Chronic obstructive pulmonary disease, unspecified: Secondary | ICD-10-CM | POA: Insufficient documentation

## 2015-10-14 DIAGNOSIS — Z862 Personal history of diseases of the blood and blood-forming organs and certain disorders involving the immune mechanism: Secondary | ICD-10-CM | POA: Diagnosis not present

## 2015-10-14 DIAGNOSIS — Y92129 Unspecified place in nursing home as the place of occurrence of the external cause: Secondary | ICD-10-CM | POA: Diagnosis not present

## 2015-10-14 DIAGNOSIS — T148XXA Other injury of unspecified body region, initial encounter: Secondary | ICD-10-CM

## 2015-10-14 DIAGNOSIS — K219 Gastro-esophageal reflux disease without esophagitis: Secondary | ICD-10-CM | POA: Insufficient documentation

## 2015-10-14 DIAGNOSIS — Z86711 Personal history of pulmonary embolism: Secondary | ICD-10-CM | POA: Diagnosis not present

## 2015-10-14 DIAGNOSIS — M791 Myalgia: Secondary | ICD-10-CM | POA: Diagnosis not present

## 2015-10-14 DIAGNOSIS — S0990XA Unspecified injury of head, initial encounter: Secondary | ICD-10-CM | POA: Diagnosis not present

## 2015-10-14 DIAGNOSIS — W1839XA Other fall on same level, initial encounter: Secondary | ICD-10-CM | POA: Diagnosis not present

## 2015-10-14 DIAGNOSIS — E785 Hyperlipidemia, unspecified: Secondary | ICD-10-CM | POA: Diagnosis not present

## 2015-10-14 DIAGNOSIS — E119 Type 2 diabetes mellitus without complications: Secondary | ICD-10-CM | POA: Diagnosis not present

## 2015-10-14 DIAGNOSIS — M810 Age-related osteoporosis without current pathological fracture: Secondary | ICD-10-CM | POA: Insufficient documentation

## 2015-10-14 DIAGNOSIS — Z9889 Other specified postprocedural states: Secondary | ICD-10-CM | POA: Insufficient documentation

## 2015-10-14 DIAGNOSIS — I480 Paroxysmal atrial fibrillation: Secondary | ICD-10-CM | POA: Diagnosis not present

## 2015-10-14 DIAGNOSIS — Z79899 Other long term (current) drug therapy: Secondary | ICD-10-CM | POA: Insufficient documentation

## 2015-10-14 DIAGNOSIS — Y9389 Activity, other specified: Secondary | ICD-10-CM | POA: Diagnosis not present

## 2015-10-14 DIAGNOSIS — S098XXA Other specified injuries of head, initial encounter: Secondary | ICD-10-CM | POA: Diagnosis not present

## 2015-10-14 DIAGNOSIS — W19XXXA Unspecified fall, initial encounter: Secondary | ICD-10-CM

## 2015-10-14 DIAGNOSIS — F0390 Unspecified dementia without behavioral disturbance: Secondary | ICD-10-CM | POA: Diagnosis not present

## 2015-10-14 DIAGNOSIS — T148 Other injury of unspecified body region: Secondary | ICD-10-CM | POA: Diagnosis not present

## 2015-10-14 DIAGNOSIS — I10 Essential (primary) hypertension: Secondary | ICD-10-CM | POA: Insufficient documentation

## 2015-10-14 DIAGNOSIS — I5032 Chronic diastolic (congestive) heart failure: Secondary | ICD-10-CM | POA: Insufficient documentation

## 2015-10-14 DIAGNOSIS — Y998 Other external cause status: Secondary | ICD-10-CM | POA: Diagnosis not present

## 2015-10-14 DIAGNOSIS — R259 Unspecified abnormal involuntary movements: Secondary | ICD-10-CM | POA: Diagnosis not present

## 2015-10-14 DIAGNOSIS — S1083XA Contusion of other specified part of neck, initial encounter: Secondary | ICD-10-CM | POA: Diagnosis not present

## 2015-10-14 DIAGNOSIS — R51 Headache: Secondary | ICD-10-CM | POA: Diagnosis not present

## 2015-10-14 NOTE — ED Provider Notes (Signed)
CSN: [id number]     Arrival date & time 10/14/15  1610 History   First MD Initiated Contact with Patient 10/14/15 1630     Chief Complaint  Patient presents with  . Fall     (Consider location/radiation/quality/duration/timing/severity/associated sxs/prior Treatment) HPI Comments: PT comes in with cc of fall. LEVEL 5 CAVEAT FOR ALTERED MENTAL STATUS / DEMENTIA. Pt resides at [facility] and had an unwitnessed fall. Pt c/o headache and neck pain, so she was asked to come to the ER. Pt denies any numbness, tingling. Pt has ambulated.  Patient is a 82 y.o. female presenting with fall. The history is provided by the patient. The history is limited by the condition of the patient.  Fall    Past Medical History  Diagnosis Date  . Allergy     allergic Rhintis  . Hyperlipidemia   . Hypertension   . Osteoporosis   . PAF (paroxysmal atrial fibrillation) (HCC)     a. PAF dates back > 5 yrs, prev on coumadin->d/c 2006 2/2 GIB;   . Carotid stenosis     a. 09/2007: Carotid Doppler stable- stable bilateral stenosis 40-59% (from 06)  . Type II diabetes mellitus (HCC)   . Tobacco abuse     a. ongoing - 50+ pack yr hx.  . Dementia     Dementia in Hospital //Does O.K. at home.  . Diverticulosis   . GI bleeding     a. when on coumadin in 2006 - colonoscopy @ that time showed 2 large right colon avm's, multiple small bowel avm's, fresh blood in prox small bowel.  . AVM (arteriovenous malformation) of colon     a. 2006 colonscopy  . Chronic respiratory failure (HCC)   . COPD (chronic obstructive pulmonary disease) (HCC)   . Chronic diastolic CHF (congestive heart failure) (HCC)   . Normocytic anemia 03/2012  . Complication of anesthesia   . PONV (postoperative nausea and vomiting)   . GERD (gastroesophageal reflux disease)   . Pulmonary embolism (HCC)     SP IVC filter   Past Surgical History  Procedure Laterality Date  . Cardiac catheterization  1990's    clear  . Orif tibia & fibula  fractures  05/2004  . Small bowel enteroscopy  03/2005    AVMS  . Colonoscopy  08/2004  . Esophagogastroduodenoscopy  08/2004    Multiple    Family History  Problem Relation Age of Onset  . Stroke Mother     a. believes she died suddenly @ 64.  . Diabetes Mother   . Stroke Father     Died from cerebral hemorrhage @ 4  . Diabetes Father   . Cancer Sister     colon and stomach cancer  . Diabetes Brother   . Heart disease Brother   . Cancer Brother     died from lung cancer  . Heart disease Brother     MI  . Cancer Sister     ? liver//brain cancer  . Heart disease Sister   . Diabetes Sister     diabetes and glaucoma  . Cancer Brother     pancreatic cancer  . Diabetes Son    Social History  Substance Use Topics  . Smoking status: Former Smoker -- 0.50 packs/day for 50 years    Types: Cigarettes    Quit date: 04/21/2012  . Smokeless tobacco: Never Used  . Alcohol Use: No   OB History    No data available  Review of Systems    Allergies  Azithromycin; Codeine; Coumadin; and Oxybutynin chloride er  Home Medications   Prior to Admission medications   Medication Sig Start Date End Date Taking? Authorizing Provider  acetaminophen (TYLENOL) 325 MG tablet Take 650 mg by mouth every 4 (four) hours as needed for mild pain or headache.   Yes Historical Provider, MD  aspirin 81 MG chewable tablet Chew 81 mg by mouth daily.   Yes Historical Provider, MD  atorvastatin (LIPITOR) 10 MG tablet Take 1 tablet (10 mg total) by mouth daily. 06/01/13  Yes Marne A Tower, MD  budesonide (PULMICORT FLEXHALER) 180 MCG/ACT inhaler Inhale 1 puff into the lungs 2 (two) times daily. 11/10/12  Yes Kalman ShanMurali Ramaswamy, MD  Cholecalciferol (VITAMIN D-3) 1000 UNITS CAPS Take 4,000 Units by mouth daily.    Yes Historical Provider, MD  docusate sodium (COLACE) 100 MG capsule Take 100 mg by mouth 2 (two) times daily.  11/26/12  Yes Eustaquio BoydenJavier Gutierrez, MD  donepezil (ARICEPT) 10 MG tablet Take 10 mg by  mouth daily.  09/08/12  Yes Judy PimpleMarne A Tower, MD  fluticasone (FLONASE) 50 MCG/ACT nasal spray Place 1 spray into the nose daily.    Yes Historical Provider, MD  lansoprazole (PREVACID) 15 MG capsule Take 1 capsule (15 mg total) by mouth daily. 11/20/12  Yes Iva Booparl E Gessner, MD  levalbuterol [facility](XOPENEX HFA) 45 MCG/ACT inhaler Inhale 1-2 puffs into the lungs every 4 (four) hours as needed for wheezing or shortness of breath.    Yes Historical Provider, MD  magnesium hydroxide (MILK OF MAGNESIA) 400 MG/5ML suspension Take 15 mLs by mouth daily as needed for constipation.   Yes Historical Provider, MD  metFORMIN (GLUCOPHAGE) 1000 MG tablet Take 1 tablet (1,000 mg total) by mouth 2 (two) times daily with a meal. 06/09/14  Yes Judy PimpleMarne A Tower, MD  mirtazapine (REMERON) 15 MG tablet Take 15 mg by mouth at bedtime.   Yes Historical Provider, MD  polyethylene glycol (MIRALAX / GLYCOLAX) packet Take 17 g by mouth daily as needed (constipation).   Yes Historical Provider, MD  Tiotropium Bromide Monohydrate (SPIRIVA RESPIMAT) 2.5 MCG/ACT AERS Inhale 2 puffs into the lungs daily. 07/13/15  Yes Kalman ShanMurali Ramaswamy, MD  vitamin B-12 (CYANOCOBALAMIN) 1000 MCG tablet Take 4,000 mcg by mouth every morning.    Yes Historical Provider, MD  hydrALAZINE (APRESOLINE) 25 MG tablet Take 1 tablet (25 mg total) by mouth 2 (two) times daily. Patient not taking: Reported on 10/14/2015 09/23/13   Beatrice LecherScott T Weaver, PA-C  tiotropium (SPIRIVA HANDIHALER) 18 MCG inhalation capsule Place 1 capsule (18 mcg total) into inhaler and inhale daily. Patient not taking: Reported on 10/14/2015 11/30/12   Kalman ShanMurali Ramaswamy, MD   BP 159/68 mmHg  Pulse 53  Temp(Src) 97.4 F (36.3 C) (Oral)  Resp 18  SpO2 94% Physical Exam  Constitutional: She is oriented to person, place, and time. She appears well-developed and well-nourished.  HENT:  Head: Normocephalic and atraumatic.  Eyes: EOM are normal. Pupils are equal, round, and reactive to light.  Neck: Neck  supple.  No midline c-spine tenderness - + paraspinal tenderness in the lower cspine  Cardiovascular: Normal rate and regular rhythm.   No murmur heard. Pulmonary/Chest: Effort normal and breath sounds normal. No respiratory distress. She exhibits no tenderness.  Abdominal: Soft. Bowel sounds are normal. She exhibits no distension. There is no tenderness.  Musculoskeletal:  No long bone tenderness - upper and lower extrmeities and no pelvic pain, instability.  Head  to toe evaluation shows no hematoma, bleeding of the scalp, no facial abrasions, step offs, crepitus, no tenderness to palpation of the bilateral upper and lower extremities, no gross deformities, no chest tenderness, no pelvic pain.   Neurological: She is alert and oriented to person, place, and time. No cranial nerve deficit.  Skin: Skin is warm and dry. No rash noted.  Nursing note and vitals reviewed.   ED Course  Procedures (including critical care time) Labs Review Labs Reviewed - No data to display  Imaging Review Ct Head Wo Contrast  10/14/2015  CLINICAL DATA:  History of dementia.  Unwitnessed fall. EXAM: CT HEAD WITHOUT CONTRAST CT CERVICAL SPINE WITHOUT CONTRAST TECHNIQUE: Multidetector CT imaging of the head and cervical spine was performed following the standard protocol without intravenous contrast. Multiplanar CT image reconstructions of the cervical spine were also generated. COMPARISON:  None. FINDINGS: CT HEAD FINDINGS Ventricles and sulci are prominent compatible with atrophy. Extensive periventricular and subcortical white matter hypodensity compatible with chronic microvascular ischemic changes. No evidence for acute cortically based infarct, intracranial hemorrhage, mass lesion or mass effect. Orbits are unremarkable. Paranasal sinuses are well aerated. Mastoid air cells are unremarkable. Calvarium is intact. CT CERVICAL SPINE FINDINGS Normal anatomic alignment. Degenerative disc disease most pronounced C4-5  and C5-6. Craniocervical junction is intact. No evidence for acute cervical spine fracture. Prevertebral soft tissues are unremarkable. IMPRESSION: No acute intracranial process. No acute cervical spine fracture. Cortical atrophy and chronic microvascular ischemic changes. Degenerative disc disease. Electronically Signed   By: Annia Belt M.D.   On: 10/14/2015 18:58   Ct Cervical Spine Wo Contrast  10/14/2015  CLINICAL DATA:  History of dementia.  Unwitnessed fall. EXAM: CT HEAD WITHOUT CONTRAST CT CERVICAL SPINE WITHOUT CONTRAST TECHNIQUE: Multidetector CT imaging of the head and cervical spine was performed following the standard protocol without intravenous contrast. Multiplanar CT image reconstructions of the cervical spine were also generated. COMPARISON:  None. FINDINGS: CT HEAD FINDINGS Ventricles and sulci are prominent compatible with atrophy. Extensive periventricular and subcortical white matter hypodensity compatible with chronic microvascular ischemic changes. No evidence for acute cortically based infarct, intracranial hemorrhage, mass lesion or mass effect. Orbits are unremarkable. Paranasal sinuses are well aerated. Mastoid air cells are unremarkable. Calvarium is intact. CT CERVICAL SPINE FINDINGS Normal anatomic alignment. Degenerative disc disease most pronounced C4-5 and C5-6. Craniocervical junction is intact. No evidence for acute cervical spine fracture. Prevertebral soft tissues are unremarkable. IMPRESSION: No acute intracranial process. No acute cervical spine fracture. Cortical atrophy and chronic microvascular ischemic changes. Degenerative disc disease. Electronically Signed   By: Annia Belt M.D.   On: 10/14/2015 18:58   I have personally reviewed and evaluated these images and lab results as part of my medical decision-making.   EKG Interpretation None      MDM   Final diagnoses:  Fall, initial encounter  Contusion    Pt comes in post fall.  DDx includes: -  Mechanical falls - ICH - Fractures - Contusions - Soft tissue injury  Pt ambulated by me - she did well with assistance.  She had some lower cspine tenderness and paraspinal tenderness - CT head and Cspine ordered.  If neg, pt will be discharged. Family made aware of the plan.    Derwood Kaplan, MD 10/14/15 [phone]

## 2015-10-14 NOTE — ED Notes (Signed)
Per EMS pt from [facility] with unwitnessed fall at door; found on right side; hx of dementia per normal. Pt complaint of generalized pain.

## 2015-10-14 NOTE — ED Notes (Signed)
Bed: WA07 Expected date: 10/14/15 Expected time: 4:04 PM Means of arrival:  Comments: Fall from [facility] home

## 2015-10-14 NOTE — Discharge Instructions (Signed)
We saw you in the ER after you had a fall. °All the imaging results are normal, no fractures seen. No evidence of brain bleed. °Please be very careful with walking, and do everything possible to prevent falls. ° ° °Fall Prevention in Hospitals, Adult °As a hospital patient, your condition and the treatments you receive can increase your risk for falls. Some additional risk factors for falls in a hospital include: °· Being in an unfamiliar environment. °· Being on bed rest. °· Your surgery. °· Taking certain medicines. °· Your tubing requirements, such as intravenous (IV) therapy or catheters. °It is important that you learn how to decrease fall risks while at the hospital. Below are important tips that can help prevent falls. °SAFETY TIPS FOR PREVENTING FALLS °Talk about your risk of falling. °· Ask your health care provider why you are at risk for falling. Is it your medicine, illness, tubing placement, or something else? °· Make a plan with your health care provider to keep you safe from falls. °· Ask your health care provider or pharmacist about side effects of your medicines. Some medicines can make you dizzy or affect your coordination. °Ask for help. °· Ask for help before getting out of bed. You may need to press your call button. °· Ask for assistance in getting safely to the toilet. °· Ask for a walker or cane to be put at your bedside. Ask that most of the side rails on your bed be placed up before your health care provider leaves the room. °· Ask family or friends to sit with you. °· Ask for things that are out of your reach, such as your glasses, hearing aids, telephone, bedside table, or call button. °Follow these tips to avoid falling: °· Stay lying or seated, rather than standing, while waiting for help. °· Wear rubber-soled slippers or shoes whenever you walk in the hospital. °· Avoid quick, sudden movements. °¨ Change positions slowly. °¨ Sit on the side of your bed before standing. °¨ Stand up  slowly and wait before you start to walk. °· Let your health care provider know if there is a spill on the floor. °· Pay careful attention to the medical equipment, electrical cords, and tubes around you. °· When you need help, use your call button by your bed or in the bathroom. Wait for one of your health care providers to help you. °· If you feel dizzy or unsure of your footing, return to bed and wait for assistance. °· Avoid being distracted by the TV, telephone, or another person in your room. °· Do not lean or support yourself on rolling objects, such as IV poles or bedside tables. °  °This information is not intended to replace advice given to you by your health care provider. Make sure you discuss any questions you have with your health care provider. °  °Document Released: 06/14/2000 Document Revised: 07/08/2014 Document Reviewed: 02/23/2012 °Elsevier Interactive Patient Education ©2016 [employer] ° °

## 2015-12-15 ENCOUNTER — Encounter: Payer: Self-pay | Admitting: Family Medicine

## 2015-12-19 ENCOUNTER — Ambulatory Visit (INDEPENDENT_AMBULATORY_CARE_PROVIDER_SITE_OTHER): Payer: Medicare Other | Admitting: Family Medicine

## 2015-12-19 ENCOUNTER — Encounter: Payer: Self-pay | Admitting: Family Medicine

## 2015-12-19 ENCOUNTER — Ambulatory Visit (INDEPENDENT_AMBULATORY_CARE_PROVIDER_SITE_OTHER): Payer: Medicare Other

## 2015-12-19 VITALS — BP 102/58 | HR 62 | Temp 98.0°F | Wt 125.2 lb

## 2015-12-19 DIAGNOSIS — I5032 Chronic diastolic (congestive) heart failure: Secondary | ICD-10-CM

## 2015-12-19 DIAGNOSIS — E119 Type 2 diabetes mellitus without complications: Secondary | ICD-10-CM | POA: Diagnosis not present

## 2015-12-19 DIAGNOSIS — Z0000 Encounter for general adult medical examination without abnormal findings: Secondary | ICD-10-CM

## 2015-12-19 DIAGNOSIS — W19XXXS Unspecified fall, sequela: Secondary | ICD-10-CM

## 2015-12-19 DIAGNOSIS — E7800 Pure hypercholesterolemia, unspecified: Secondary | ICD-10-CM | POA: Diagnosis not present

## 2015-12-19 DIAGNOSIS — E559 Vitamin D deficiency, unspecified: Secondary | ICD-10-CM | POA: Diagnosis not present

## 2015-12-19 DIAGNOSIS — M810 Age-related osteoporosis without current pathological fracture: Secondary | ICD-10-CM

## 2015-12-19 DIAGNOSIS — E538 Deficiency of other specified B group vitamins: Secondary | ICD-10-CM | POA: Diagnosis not present

## 2015-12-19 DIAGNOSIS — F0390 Unspecified dementia without behavioral disturbance: Secondary | ICD-10-CM | POA: Diagnosis not present

## 2015-12-19 DIAGNOSIS — J449 Chronic obstructive pulmonary disease, unspecified: Secondary | ICD-10-CM

## 2015-12-19 DIAGNOSIS — Y92129 Unspecified place in nursing home as the place of occurrence of the external cause: Secondary | ICD-10-CM

## 2015-12-19 LAB — COMPREHENSIVE METABOLIC PANEL
ALT: 6 U/L (ref 0–35)
AST: 9 U/L (ref 0–37)
Albumin: 4.1 g/dL (ref 3.5–5.2)
Alkaline Phosphatase: 41 U/L (ref 39–117)
BUN: 28 mg/dL — ABNORMAL HIGH (ref 6–23)
CALCIUM: 10 mg/dL (ref 8.4–10.5)
CHLORIDE: 104 meq/L (ref 96–112)
CO2: 30 meq/L (ref 19–32)
CREATININE: 0.97 mg/dL (ref 0.40–1.20)
GFR: 58.15 mL/min — ABNORMAL LOW (ref >60.00)
Glucose, Bld: 104 mg/dL — ABNORMAL HIGH (ref 70–99)
POTASSIUM: 4.5 meq/L (ref 3.5–5.1)
SODIUM: 141 meq/L (ref 135–145)
Total Bilirubin: 0.3 mg/dL (ref 0.2–1.2)
Total Protein: 6.5 g/dL (ref 6.0–8.3)

## 2015-12-19 LAB — CBC WITH DIFFERENTIAL/PLATELET
BASOS PCT: 0.3 % (ref 0.0–3.0)
Basophils Absolute: 0 10*3/uL (ref 0.0–0.1)
EOS ABS: 0.1 10*3/uL (ref 0.0–0.7)
EOS PCT: 1 % (ref 0.0–5.0)
HCT: 36.3 % (ref 36.0–46.0)
Hemoglobin: 11.9 g/dL — ABNORMAL LOW (ref 12.0–15.0)
Lymphocytes Relative: 31.5 % (ref 12.0–46.0)
Lymphs Abs: 2.7 10*3/uL (ref 0.7–4.0)
MCHC: 32.9 g/dL (ref 30.0–36.0)
MCV: 90.9 fl (ref 78.0–100.0)
MONO ABS: 0.5 10*3/uL (ref 0.1–1.0)
Monocytes Relative: 5.8 % (ref 3.0–12.0)
NEUTROS ABS: 5.3 10*3/uL (ref 1.4–7.7)
Neutrophils Relative %: 61.4 % (ref 43.0–77.0)
PLATELETS: 234 10*3/uL (ref 150.0–400.0)
RBC: 3.99 Mil/uL (ref 3.87–5.11)
RDW: 14 % (ref 11.5–15.5)
WBC: 8.7 10*3/uL (ref 4.0–10.5)

## 2015-12-19 LAB — HEMOGLOBIN A1C: Hgb A1c MFr Bld: 6.4 % (ref 4.6–6.5)

## 2015-12-19 LAB — LIPID PANEL
CHOL/HDL RATIO: 3
Cholesterol: 143 mg/dL (ref 0–200)
HDL: 54.9 mg/dL (ref >39.00)
LDL CALC: 67 mg/dL (ref 0–99)
NonHDL: 88.08
TRIGLYCERIDES: 105 mg/dL (ref 0.0–149.0)
VLDL: 21 mg/dL (ref 0.0–40.0)

## 2015-12-19 LAB — VITAMIN D 25 HYDROXY (VIT D DEFICIENCY, FRACTURES): VITD: 61.29 ng/mL (ref 30.00–100.00)

## 2015-12-19 LAB — VITAMIN B12: Vitamin B-12: >1500 pg/mL — ABNORMAL HIGH (ref 211–911)

## 2015-12-19 LAB — TSH: TSH: 1.18 u[IU]/mL (ref 0.35–4.50)

## 2015-12-19 MED ORDER — MIRTAZAPINE 30 MG PO TABS: 30.0000 mg | ORAL_TABLET | Freq: Every day | ORAL | Status: AC

## 2015-12-19 NOTE — Progress Notes (Signed)
Pre visit review using our clinic review tool, if applicable. No additional management support is needed unless otherwise documented below in the visit note. 

## 2015-12-19 NOTE — Assessment & Plan Note (Signed)
Gradual worsening with more isolative behavoirs and dec appetite Disc use of cues for toileting and meals - supervision with walking and WC use only when needed Will try inc mirtazapine to 30 mg qhs for better sleep and also appetite-will update  Discussed expectations of this medication including time to effectiveness and mechanism of action, also poss of side effects (early and late)- including mental fuzziness, weight or appetite change, nausea and poss of worse dep or anxiety (even suicidal thoughts)  Pt voiced understanding and will stop med and update if this occurs

## 2015-12-19 NOTE — Assessment & Plan Note (Signed)
Stable on current tx w/o recent problems

## 2015-12-19 NOTE — Progress Notes (Signed)
Subjective:   [name] is a 82 y.o. female who presents for Medicare Annual (Subsequent) preventive examination.  Review of Systems:  N/A Cardiac Risk Factors include: advanced age (>71men, >61 women)     Objective:     Vitals: BP 102/58 mmHg  Pulse 62  Temp(Src) 98 F (36.7 C)  Wt 125 lb 4 oz (56.813 kg)  SpO2 97%  Body mass index is 22.9 kg/(m^2).   Tobacco History  Smoking status  . Former Smoker -- 0.50 packs/day for 50 years  . Types: Cigarettes  . Quit date: 04/21/2012  Smokeless tobacco  . Never Used     Counseling given: No   Past Medical History  Diagnosis Date  . Allergy     allergic Rhintis  . Hyperlipidemia   . Hypertension   . Osteoporosis   . PAF (paroxysmal atrial fibrillation) (HCC)     a. PAF dates back > 5 yrs, prev on coumadin->d/c 2006 2/2 GIB;   . Carotid stenosis     a. 09/2007: Carotid Doppler stable- stable bilateral stenosis 40-59% (from 06)  . Type II diabetes mellitus (HCC)   . Tobacco abuse     a. ongoing - 50+ pack yr hx.  . Dementia     Dementia in Hospital //Does O.K. at home.  . Diverticulosis   . GI bleeding     a. when on coumadin in 2006 - colonoscopy @ that time showed 2 large right colon avm's, multiple small bowel avm's, fresh blood in prox small bowel.  . AVM (arteriovenous malformation) of colon     a. 2006 colonscopy  . Chronic respiratory failure (HCC)   . COPD (chronic obstructive pulmonary disease) (HCC)   . Chronic diastolic CHF (congestive heart failure) (HCC)   . Normocytic anemia 03/2012  . Complication of anesthesia   . PONV (postoperative nausea and vomiting)   . GERD (gastroesophageal reflux disease)   . Pulmonary embolism (HCC)     SP IVC filter   Past Surgical History  Procedure Laterality Date  . Cardiac catheterization  1990's    clear  . Orif tibia & fibula fractures  05/2004  . Small bowel enteroscopy  03/2005    AVMS  . Colonoscopy  08/2004  . Esophagogastroduodenoscopy  08/2004   Multiple    Family History  Problem Relation Age of Onset  . Stroke Mother     a. believes she died suddenly @ 39.  . Diabetes Mother   . Stroke Father     Died from cerebral hemorrhage @ 68  . Diabetes Father   . Cancer Sister     colon and stomach cancer  . Diabetes Brother   . Heart disease Brother   . Cancer Brother     died from lung cancer  . Heart disease Brother     MI  . Cancer Sister     ? liver//brain cancer  . Heart disease Sister   . Diabetes Sister     diabetes and glaucoma  . Cancer Brother     pancreatic cancer  . Diabetes Son    History  Sexual Activity  . Sexual Activity: No    Outpatient Encounter Prescriptions as of 12/19/2015  Medication Sig  . acetaminophen (TYLENOL) 325 MG tablet Take 650 mg by mouth every 4 (four) hours as needed for mild pain or headache.  [name] aspirin 81 MG chewable tablet Chew 81 mg by mouth daily.  [name] atorvastatin (LIPITOR) 10 MG tablet Take 1  tablet (10 mg total) by mouth daily.  . budesonide (PULMICORT FLEXHALER) 180 MCG/ACT inhaler Inhale 1 puff into the lungs 2 (two) times daily.  . Cholecalciferol (VITAMIN D-3) 1000 UNITS CAPS Take 4,000 Units by mouth daily.   [name]. docusate sodium (COLACE) 100 MG capsule Take 100 mg by mouth 2 (two) times daily.   [name]. donepezil (ARICEPT) 10 MG tablet Take 10 mg by mouth daily.   . fluticasone (FLONASE) 50 MCG/ACT nasal spray Place 1 spray into the nose daily.   . hydrALAZINE (APRESOLINE) 25 MG tablet Take 1 tablet (25 mg total) by mouth 2 (two) times daily.  . lansoprazole (PREVACID) 15 MG capsule Take 1 capsule (15 mg total) by mouth daily.  [name]. levalbuterol (XOPENEX HFA) 45 MCG/ACT inhaler Inhale 1-2 puffs into the lungs every 4 (four) hours as needed for wheezing or shortness of breath.   . magnesium hydroxide (MILK OF MAGNESIA) 400 MG/5ML suspension Take 15 mLs by mouth daily as needed for constipation.  . metFORMIN (GLUCOPHAGE) 1000 MG tablet Take 1 tablet (1,000 mg total) by mouth 2 (two)  times daily with a meal.  . mirtazapine (REMERON) 30 MG tablet Take 1 tablet (30 mg total) by mouth at bedtime.  . polyethylene glycol (MIRALAX / GLYCOLAX) packet Take 17 g by mouth daily as needed (constipation).  [name]. tiotropium (SPIRIVA HANDIHALER) 18 MCG inhalation capsule Place 1 capsule (18 mcg total) into inhaler and inhale daily.  . Tiotropium Bromide Monohydrate (SPIRIVA RESPIMAT) 2.5 MCG/ACT AERS Inhale 2 puffs into the lungs daily.  . vitamin B-12 (CYANOCOBALAMIN) 1000 MCG tablet Take 4,000 mcg by mouth every morning.    No facility-administered encounter medications on file as of 12/19/2015.    Activities of Daily Living In your present state of health, do you have any difficulty performing the following activities: 12/19/2015  Hearing? Y  Vision? Y  Difficulty concentrating or making decisions? Y  Walking or climbing stairs? Y  Dressing or bathing? Y  Doing errands, shopping? Y  Preparing Food and eating ? Y  Using the Toilet? Y  In the past six months, have you accidently leaked urine? Y  Do you have problems with loss of bowel control? N  Managing your Medications? Y  Managing your Finances? Y  Housekeeping or managing your Housekeeping? Y    Patient Care Team: Judy PimpleMarne A Tower, MD as PCP - General (Family Medicine)    Assessment:    [facility] Scre[facility]  Exercise Activities and Dietary recommendations Exercise limited by: None identified  Goals    None     Fall Risk Fall Risk  12/19/2015 12/12/2014  Falls in the past year? Yes Yes  Number falls in past yr: 2 or more 2 or more  Injury with Fall? - Yes  Risk Factor Category  High Fall Risk -  Follow up Falls evaluation completed -   Depression Screen PHQ 2/9 Scores 12/19/2015 12/12/2014  PHQ - 2 Score 1 0     Cognitive Testing MMSE - Mini Mental State Exam 12/19/2015  Not completed: Unable to complete  Note: Dx of dementia  Immunization History  Administered  Date(s) Administered  . Influenza Split 04/22/2012, 07/13/2012, 03/31/2014  . Influenza Whole 05/08/2007, 03/28/2008, 03/28/2010  . Influenza-Unspecified 04/15/2015  . PPD Test 11/17/2012  . Pneumococcal Conjugate-13 09/16/2013  . Pneumococcal Polysaccharide-23 05/26/2006, 04/22/2012  . Td 09/13/2003  . Zoster 07/01/2006   Screening Tests Health Maintenance  Topic Date Due  . OPHTHALMOLOGY  EXAM  12/18/2016 (Originally 12/29/1939)  . TETANUS/TDAP  12/27/2016 (Originally 09/12/2013)  . INFLUENZA VACCINE  01/30/2016  . HEMOGLOBIN A1C  06/19/2016  . FOOT EXAM  12/18/2016  . DEXA SCAN  Completed  . ZOSTAVAX  Completed  . PNA vac Low Risk Adult  Completed      Plan:    I have personally reviewed and addressed the Medicare Annual Wellness questionnaire and have noted the following in the patient's chart:  A. Medical and social history B. Use of alcohol, tobacco or illicit drugs  C. Current medications and supplements D. Functional ability and status E.  Nutritional status F.  Physical activity G. Advance directives H. List of other physicians I.  Hospitalizations, surgeries, and ER visits in previous 12 months J.  Vitals K. Screenings to include hearing, vision, cognitive, depression L. Referrals and appointments - none  In addition, I have reviewed and discussed with patient certain preventive protocols, quality metrics, and best practice recommendations. A written personalized care plan for preventive services as well as general preventive health recommendations were provided to patient.  See attached scanned questionnaire for additional information.   Signed,   Randa Evens, MHA, BS, LPN Health Advisor

## 2015-12-19 NOTE — Progress Notes (Signed)
Subjective:    Patient ID: [name], female    DOB: 10/26/1929, 82 y.o.   MRN: [id number]  HPI Here for health maintenance exam and to review chronic medical problems   Has had 3 recent falls in April may and June w/o injuries Rev ED records and CT of spine and head  More confused with dementia lately She lives at [place]    Once- slid out of a chair when trying to get up  2 other times - found in different room/wandering  Usually happens around mealtime  Supposed to use a walker- usually does (needs more supervision with walker as well)  Put new tennis balls on feet of walker    Has visit for her AMW later today  Wt is down to 125 with bmi of 22.9  Lost 14 lb since February  ? If eating less / possibly not going to eat  Some missed meals  - does enjoy pb and jelly sandwhiches   Td 3/05- will see if she can get at a pharmacy   dexa 11/01-osteoporosis  Declined further dexa  Has taken course of fosamax in the past  Falls- multiple  fx hx-none recent    utd other imms   Breast cancer screening - does not want mammogram   Colon cancer screening -colonoscopy 3/06 -one polyp   Due for labs today incl D and B12 levels and cholesterol   Lab Results  Component Value Date   HGBA1C 6.6* 06/14/2015  at the time stable and well controlled  opthy = is due  (macular deg and also cataracts)    COPD-stable/sees pulmonary-no new problems  Still does not smoke    Dementia On aricept  On mirtazapine  Isolating herself more now   Patient Active Problem List   Diagnosis Date Noted  . Low back pain 08/21/2015  . Chronic obstructive pulmonary disease, unspecified COPD, unspecified chronic bronchitis type 07/13/2015  . Encounter for Medicare annual wellness exam 12/12/2014  . Routine general medical examination at a health care facility 12/12/2014  . Abnormal urine odor 12/12/2014  . Syncope 08/26/2013  . Bradycardia 08/26/2013  . Hip pain 06/30/2013  . Fall at  nursing home 06/30/2013  . Near syncope 09/30/2012  . Headache(784.0) 09/30/2012  . Failure to thrive in adult 09/25/2012  . Depression 09/25/2012  . Abdominal pain, lower 09/25/2012  . Vitamin B12 deficiency 09/08/2012  . Hypertrophic toenail 07/21/2012  . OSA (obstructive sleep apnea) 07/15/2012  . Personal history of pulmonary embolism 06/17/2012  . Chest pain 06/16/2012  . Esophageal dysmotility suspected 06/15/2012  . GERD (gastroesophageal reflux disease) 06/15/2012  . Acute and chronic respiratory failure 04/23/2012  . Dementia 04/21/2012  . Chronic diastolic heart failure (HCC) 04/21/2012  . COPD (chronic obstructive pulmonary disease) (HCC) 04/21/2012  . Mixed incontinence urge and stress 01/04/2011  . Vitamin D deficiency 07/14/2010  . ANEMIA 01/24/2009  . Former smoker 11/04/2007  . MORTON'S NEUROMA 11/03/2007  . MACULAR DEGENERATION 11/03/2007  . Allergic rhinitis 11/03/2007  . Osteoporosis 11/03/2007  . Diabetes type 2, controlled (HCC) 11/03/2007  . HYPERCHOLESTEROLEMIA 11/25/2006   Past Medical History  Diagnosis Date  . Allergy     allergic Rhintis  . Hyperlipidemia   . Hypertension   . Osteoporosis   . PAF (paroxysmal atrial fibrillation) (HCC)     a. PAF dates back > 5 yrs, prev on coumadin->d/c 2006 2/2 GIB;   . Carotid stenosis     a. 09/2007: Carotid Doppler  stable- stable bilateral stenosis 40-59% (from 06)  . Type II diabetes mellitus (HCC)   . Tobacco abuse     a. ongoing - 50+ pack yr hx.  . Dementia     Dementia in Hospital //Does O.K. at home.  . Diverticulosis   . GI bleeding     a. when on coumadin in 2006 - colonoscopy @ that time showed 2 large right colon avm's, multiple small bowel avm's, fresh blood in prox small bowel.  . AVM (arteriovenous malformation) of colon     a. 2006 colonscopy  . Chronic respiratory failure (HCC)   . COPD (chronic obstructive pulmonary disease) (HCC)   . Chronic diastolic CHF (congestive heart failure)  (HCC)   . Normocytic anemia 03/2012  . Complication of anesthesia   . PONV (postoperative nausea and vomiting)   . GERD (gastroesophageal reflux disease)   . Pulmonary embolism (HCC)     SP IVC filter   Past Surgical History  Procedure Laterality Date  . Cardiac catheterization  1990's    clear  . Orif tibia & fibula fractures  05/2004  . Small bowel enteroscopy  03/2005    AVMS  . Colonoscopy  08/2004  . Esophagogastroduodenoscopy  08/2004    Multiple    Social History  Substance Use Topics  . Smoking status: Former Smoker -- 0.50 packs/day for 50 years    Types: Cigarettes    Quit date: 04/21/2012  . Smokeless tobacco: Never Used  . Alcohol Use: No   Family History  Problem Relation Age of Onset  . Stroke Mother     a. believes she died suddenly @ 9058.  . Diabetes Mother   . Stroke Father     Died from cerebral hemorrhage @ 6658  . Diabetes Father   . Cancer Sister     colon and stomach cancer  . Diabetes Brother   . Heart disease Brother   . Cancer Brother     died from lung cancer  . Heart disease Brother     MI  . Cancer Sister     ? liver//brain cancer  . Heart disease Sister   . Diabetes Sister     diabetes and glaucoma  . Cancer Brother     pancreatic cancer  . Diabetes Son    Allergies  Allergen Reactions  . Azithromycin Other (See Comments)    inc blood sugar  . Codeine Itching  . Coumadin [Warfarin] Other (See Comments)    Causes bleeding  . Oxybutynin Chloride Er Other (See Comments)    Constipation/ dizziness/ dry mouth   Current Outpatient Prescriptions on File Prior to Visit  Medication Sig Dispense Refill  . acetaminophen (TYLENOL) 325 MG tablet Take 650 mg by mouth every 4 (four) hours as needed for mild pain or headache.    [name]. aspirin 81 MG chewable tablet Chew 81 mg by mouth daily.    [name]. atorvastatin (LIPITOR) 10 MG tablet Take 1 tablet (10 mg total) by mouth daily. 90 tablet 3  . budesonide (PULMICORT FLEXHALER) 180 MCG/ACT inhaler  Inhale 1 puff into the lungs 2 (two) times daily. 1 Inhaler 6  . Cholecalciferol (VITAMIN D-3) 1000 UNITS CAPS Take 4,000 Units by mouth daily.     [name]. docusate sodium (COLACE) 100 MG capsule Take 100 mg by mouth 2 (two) times daily.     [name]. donepezil (ARICEPT) 10 MG tablet Take 10 mg by mouth daily.     . fluticasone (FLONASE) 50 MCG/ACT nasal  spray Place 1 spray into the nose daily.     . hydrALAZINE (APRESOLINE) 25 MG tablet Take 1 tablet (25 mg total) by mouth 2 (two) times daily.    . lansoprazole (PREVACID) 15 MG capsule Take 1 capsule (15 mg total) by mouth daily. 90 capsule 1  . levalbuterol (XOPENEX HFA) 45 MCG/ACT inhaler Inhale 1-2 puffs into the lungs every 4 (four) hours as needed for wheezing or shortness of breath.     . magnesium hydroxide (MILK OF MAGNESIA) 400 MG/5ML suspension Take 15 mLs by mouth daily as needed for constipation.    . metFORMIN (GLUCOPHAGE) 1000 MG tablet Take 1 tablet (1,000 mg total) by mouth 2 (two) times daily with a meal. 60 tablet 5  . polyethylene glycol (MIRALAX / GLYCOLAX) packet Take 17 g by mouth daily as needed (constipation).    [name] tiotropium (SPIRIVA HANDIHALER) 18 MCG inhalation capsule Place 1 capsule (18 mcg total) into inhaler and inhale daily. 30 capsule 5  . Tiotropium Bromide Monohydrate (SPIRIVA RESPIMAT) 2.5 MCG/ACT AERS Inhale 2 puffs into the lungs daily. 4 g 11  . vitamin B-12 (CYANOCOBALAMIN) 1000 MCG tablet Take 4,000 mcg by mouth every morning.      No current facility-administered medications on file prior to visit.    Review of Systems Review of Systems  Constitutional: Negative for fever, pos for appetite change and wt loss and sleep changes with dementia   Eyes: Negative for pain and pos for visual disturbance.  Respiratory: Negative for cough and shortness of breath.   Cardiovascular: Negative for cp or palpitations    Gastrointestinal: Negative for nausea, diarrhea and constipation.  Genitourinary: Negative for urgency and  frequency.  Skin: Negative for pallor or rash   Neurological: Negative for weakness, light-headedness, numbness and headaches.  Hematological: Negative for adenopathy. Does not bruise/bleed easily.  Psychiatric/Behavioral: Negative for dysphoric mood. The patient is sometimes  nervous/anxious.         Objective:   Physical Exam  Constitutional: She appears well-developed and well-nourished. No distress.  Frail appearing elderly female with dementia in wheelchair-dozing at intervals  HENT:  Head: Normocephalic and atraumatic.  Right Ear: External ear normal.  Left Ear: External ear normal.  Mouth/Throat: Oropharynx is clear and moist.  Eyes: Conjunctivae and EOM are normal. Pupils are equal, round, and reactive to light. No scleral icterus.  Neck: Normal range of motion. Neck supple. No JVD present. Carotid bruit is not present. No thyromegaly present.  Cardiovascular: Normal rate, regular rhythm, normal heart sounds and intact distal pulses.  Exam reveals no gallop.   Pulmonary/Chest: Effort normal and breath sounds normal. No respiratory distress. She has no wheezes. She exhibits no tenderness.  Diffusely distant bs   Abdominal: Soft. Bowel sounds are normal. She exhibits no distension, no abdominal bruit and no mass. There is no tenderness.  Genitourinary: No breast swelling, tenderness, discharge or bleeding.  Breast exam: No mass, nodules, thickening, tenderness, bulging, retraction, inflamation, nipple discharge or skin changes noted.  No axillary or clavicular LA.      Musculoskeletal: Normal range of motion. She exhibits no edema or tenderness.  Lymphadenopathy:    She has no cervical adenopathy.  Neurological: She is alert. She has normal reflexes. No cranial nerve deficit. She exhibits normal muscle tone. Coordination normal.  Skin: Skin is warm and dry. No rash noted. No erythema. No pallor.  Many SKs baseline   Psychiatric:  Dementia baseline  Sleepy and dozing today but  cooperative with exam  Babbles at times           Assessment & Plan:   Problem List Items Addressed This Visit      Cardiovascular and Mediastinum   Chronic diastolic heart failure (HCC)    Stable on current tx w/o recent problems         Respiratory   COPD (chronic obstructive pulmonary disease) (HCC) - Primary    Stable with current inhalers managed by pulmonary        Digestive   Vitamin B12 deficiency    Level with labs today      Relevant Orders   Vitamin B12     Endocrine   Diabetes type 2, controlled (HCC)    This has been previously stable and well controlled A1C today  Disc diet - making sure not to skip meals and get protein Family will work on getting an eye exam      Relevant Orders   Hemoglobin A1c     Nervous and Auditory   Dementia    Gradual worsening with more isolative behavoirs and dec appetite Disc use of cues for toileting and meals - supervision with walking and WC use only when needed Will try inc mirtazapine to 30 mg qhs for better sleep and also appetite-will update  Discussed expectations of this medication including time to effectiveness and mechanism of action, also poss of side effects (early and late)- including mental fuzziness, weight or appetite change, nausea and poss of worse dep or anxiety (even suicidal thoughts)  Pt voiced understanding and will stop med and update if this occurs        Relevant Medications   mirtazapine (REMERON) 30 MG tablet     Musculoskeletal and Integument   Osteoporosis    Declines further dexa at her age with limitations in mobility Disc need for calcium/ vitamin D/ wt bearing exercise and bone density test every 2 y to monitor Disc safety/ fracture risk in detail   Has had 5 y of fosamax in the past  Multiple falls/ no fx        Other   Vitamin D deficiency    In pt with OP She is taking it daily  Level with labs today      Relevant Orders   VITAMIN D 25 Hydroxy (Vit-D Deficiency,  Fractures)   Routine general medical examination at a health care facility    Reviewed health habits including diet and exercise and skin cancer prevention Reviewed appropriate screening tests for age  Also reviewed health mt list, fam hx and immunization status , as well as social and family history   For AMW with Lesia later today Labs today  Do encourage supervised exercise with walker/ supervised transports in general (use of wheelchair only if needed) and fall precautions  See if you can get a tetanus shot at a pharmacy (check with insurance) Get an eye exam when you can Labs today  Declines cancer screening at her age      Relevant Orders   CBC with Differential/Platelet   Comprehensive metabolic panel   TSH   Lipid panel   HYPERCHOLESTEROLEMIA    Lab today  More concerned with nutritional status at this point      Relevant Orders   Lipid panel   Fall at nursing home    Multiple falls Outlined a plan for fall prev incl supervision of gait at all times and also enc exercise with walker to inc strength Dementia makes this challenging  Note written for her facility

## 2015-12-19 NOTE — Assessment & Plan Note (Signed)
This has been previously stable and well controlled A1C today  Disc diet - making sure not to skip meals and get protein Family will work on getting an eye exam

## 2015-12-19 NOTE — Progress Notes (Signed)
PCP notes:  Health maintenance:  Eye exam - per son, appt will be scheduled Tetanus - postponed/insurance A1C - completed Foot exam - completed  Abnormal screenings:  Fall risk - hx of multiple falls while in nursing facility  Patient concerns: None  Nurse concerns: None  Next PCP appt: 06/19/2016 @ 1130  I reviewed health advisor's note, was available for consultation, and agree with documentation and plan. Roxy MannsMarne Tower MD

## 2015-12-19 NOTE — Assessment & Plan Note (Addendum)
Reviewed health habits including diet and exercise and skin cancer prevention Reviewed appropriate screening tests for age  Also reviewed health mt list, fam hx and immunization status , as well as social and family history   For AMW with [place] later today Labs today  Do encourage supervised exercise with walker/ supervised transports in general (use of wheelchair only if needed) and fall precautions  See if you can get a tetanus shot at a pharmacy (check with insurance) Get an eye exam when you can Labs today  Declines cancer screening at her age

## 2015-12-19 NOTE — Assessment & Plan Note (Signed)
Lab today  More concerned with nutritional status at this point

## 2015-12-19 NOTE — Assessment & Plan Note (Signed)
In pt with OP She is taking it daily  Level with labs today

## 2015-12-19 NOTE — Assessment & Plan Note (Signed)
Multiple falls Outlined a plan for fall prev incl supervision of gait at all times and also enc exercise with walker to inc strength Dementia makes this challenging  Note written for her facility

## 2015-12-19 NOTE — Assessment & Plan Note (Signed)
Stable with current inhalers managed by pulmonary

## 2015-12-19 NOTE — Patient Instructions (Signed)
[name] , Thank you for taking time to come for your Medicare Wellness Visit. I appreciate your ongoing commitment to your health goals. Please review the following plan we discussed and let me know if I can assist you in the future.    This is a list of the screening recommended for you and due dates:  Health Maintenance  Topic Date Due  . Eye exam for diabetics  12/29/1939  . Urine Protein Check  11/03/2008  . Tetanus Vaccine  09/12/2013  . Complete foot exam   12/12/2015  . Hemoglobin A1C  12/13/2015  . Flu Shot  01/30/2016  . DEXA scan (bone density measurement)  Completed  . Shingles Vaccine  Completed  . Pneumonia vaccines  Completed   Preventive Care for Adults  A healthy lifestyle and preventive care can promote health and wellness. Preventive health guidelines for adults include the following key practices.  . A routine yearly physical is a good way to check with your health care provider about your health and preventive screening. It is a chance to share any concerns and updates on your health and to receive a thorough exam.  . Visit your dentist for a routine exam and preventive care every 6 months. Brush your teeth twice a day and floss once a day. Good oral hygiene prevents tooth decay and gum disease.  . The frequency of eye exams is based on your age, health, family medical history, use  of contact lenses, and other factors. Follow your health care provider's ecommendations for frequency of eye exams.  . Eat a healthy diet. Foods like vegetables, fruits, whole grains, low-fat dairy products, and lean protein foods contain the nutrients you need without too many calories. Decrease your intake of foods high in solid fats, added sugars, and salt. Eat the right amount of calories for you. Get information about a proper diet from your health care provider, if necessary.  . Regular physical exercise is one of the most important things you can do for your health. Most  adults should get at least 150 minutes of moderate-intensity exercise (any activity that increases your heart rate and causes you to sweat) each week. In addition, most adults need muscle-strengthening exercises on 2 or more days a week.  Silver Sneakers may be a benefit available to you. To determine eligibility, you may visit the website: www.silversneakers.com or contact program at (928)017-7648 Mon-Fri between 8AM-8PM.   . Maintain a healthy weight. The body mass index (BMI) is a screening tool to identify possible weight problems. It provides an estimate of body fat based on height and weight. Your health care provider can find your BMI and can help you achieve or maintain a healthy weight.   For adults 20 years and older: ? A BMI below 18.5 is considered underweight. ? A BMI of 18.5 to 24.9 is normal. ? A BMI of 25 to 29.9 is considered overweight. ? A BMI of 30 and above is considered obese.   . Maintain normal blood lipids and cholesterol levels by exercising and minimizing your intake of saturated fat. Eat a balanced diet with plenty of fruit and vegetables. Blood tests for lipids and cholesterol should begin at age 53 and be repeated every 5 years. If your lipid or cholesterol levels are high, you are over 50, or you are at high risk for heart disease, you may need your cholesterol levels checked more frequently. Ongoing high lipid and cholesterol levels should be treated with medicines if diet  and exercise are not working.  . If you smoke, find out from your health care provider how to quit. If you do not use tobacco, please do not start.  . If you choose to drink alcohol, please do not consume more than 2 drinks per day. One drink is considered to be 12 ounces (355 mL) of beer, 5 ounces (148 mL) of wine, or 1.5 ounces (44 mL) of liquor.  . If you are 9255-80 years old, ask your health care provider if you should take aspirin to prevent strokes.  . Use sunscreen. Apply sunscreen  liberally and repeatedly throughout the day. You should seek shade when your shadow is shorter than you. Protect yourself by wearing long sleeves, pants, a wide-brimmed hat, and sunglasses year round, whenever you are outdoors.  . Once a month, do a whole body skin exam, using a mirror to look at the skin on your back. Tell your health care provider of new moles, moles that have irregular borders, moles that are larger than a pencil eraser, or moles that have changed in shape or color.

## 2015-12-19 NOTE — Assessment & Plan Note (Signed)
Declines further dexa at her age with limitations in mobility Disc need for calcium/ vitamin D/ wt bearing exercise and bone density test every 2 y to monitor Disc safety/ fracture risk in detail   Has had 5 y of fosamax in the past  Multiple falls/ no fx

## 2015-12-19 NOTE — Patient Instructions (Signed)
Do encourage supervised exercise with walker/ supervised transports in general (use of wheelchair only if needed) and fall precautions  See if you can get a tetanus shot at a pharmacy (check with insurance) Get an eye exam when you can Labs today

## 2015-12-19 NOTE — Assessment & Plan Note (Signed)
Level with labs today

## 2016-02-29 ENCOUNTER — Telehealth: Payer: Self-pay | Admitting: Family Medicine

## 2016-02-29 NOTE — Telephone Encounter (Signed)
Spoke with Herbert SetaHeather she just wanted to check the status of the orders they faxed over, I advised Herbert SetaHeather that we received orders and they are in Dr. Royden Purlower's inbox to review once she returns tomorrow (Dr.  Milinda Antisower out of the office today: Thursday)

## 2016-02-29 NOTE — Telephone Encounter (Signed)
Heather @ [facility] called she has ? About order

## 2016-03-01 NOTE — Telephone Encounter (Signed)
Orders faxed and then sent for scanning

## 2016-03-01 NOTE — Telephone Encounter (Signed)
Done and in IN box 

## 2016-04-02 ENCOUNTER — Telehealth: Payer: Self-pay | Admitting: Family Medicine

## 2016-04-02 NOTE — Telephone Encounter (Signed)
I called and ok'd the xray  Will wait for results

## 2016-04-02 NOTE — Telephone Encounter (Signed)
Angie called to report that pt had a fall on 10/1 and is now complaining of LBP and bilateral hip pain.  She is unable to walk and has some incontinence.  They would like an order for a mobile x ray.  Can you please call them at [phone]

## 2016-04-04 ENCOUNTER — Other Ambulatory Visit: Payer: Self-pay | Admitting: Family Medicine

## 2016-04-04 ENCOUNTER — Ambulatory Visit (INDEPENDENT_AMBULATORY_CARE_PROVIDER_SITE_OTHER)
Admission: RE | Admit: 2016-04-04 | Discharge: 2016-04-04 | Disposition: A | Discharge status: Home / Self Care | Payer: Medicare Other | Source: Ambulatory Visit | Attending: Family Medicine | Admitting: Family Medicine

## 2016-04-04 ENCOUNTER — Ambulatory Visit (INDEPENDENT_AMBULATORY_CARE_PROVIDER_SITE_OTHER): Payer: Medicare Other | Admitting: Family Medicine

## 2016-04-04 ENCOUNTER — Telehealth: Payer: Self-pay

## 2016-04-04 ENCOUNTER — Encounter: Payer: Self-pay | Admitting: Family Medicine

## 2016-04-04 VITALS — BP 110/78 | HR 71

## 2016-04-04 DIAGNOSIS — M545 Low back pain: Secondary | ICD-10-CM

## 2016-04-04 DIAGNOSIS — W19XXXA Unspecified fall, initial encounter: Secondary | ICD-10-CM | POA: Diagnosis not present

## 2016-04-04 DIAGNOSIS — S3992XA Unspecified injury of lower back, initial encounter: Secondary | ICD-10-CM | POA: Diagnosis not present

## 2016-04-04 DIAGNOSIS — K5900 Constipation, unspecified: Secondary | ICD-10-CM

## 2016-04-04 DIAGNOSIS — S79912A Unspecified injury of left hip, initial encounter: Secondary | ICD-10-CM | POA: Diagnosis not present

## 2016-04-04 MED ORDER — ACETAMINOPHEN 500 MG PO TABS
1000.0000 mg | ORAL_TABLET | Freq: Once | ORAL | Status: AC
  Administered 2016-04-04: 1000 mg via ORAL

## 2016-04-04 NOTE — Telephone Encounter (Signed)
Toni ArthursSara Napier with [facility] left v/m requesting copy of office notes from 04/04/16 faxed to [phone]. Done.

## 2016-04-04 NOTE — Progress Notes (Signed)
Subjective:    Patient ID: [name], female    DOB: 10/12/1929, 82 y.o.   MRN: [id number]  HPI This is an 82 yo female, accompanied by her son and daughter in law, who presents today following a fall 4 days ago. EMS was called and checked her. [facility] and was found on the floor and her walker was overturned. She was up and walking at the time of evaluation. She complained of some soreness around her back. Monday, she was up and out of bed using her walker and reporting being sore. Tuesday she was not out of bed, could not get up to stand. Over the last two days, she has not been getting up as usual, has been complaining of pain in her back. Today, it was very difficult to get her to stand for transfer in and out of vehicle used to bring her here. No obvious SOB.   According to family members, prior to the fall, the patient was transferring and ambulating with her walker. She was able to toilet independently.   According to EMR, Dr. Milinda Antisower had contacted [facility] to approve orders for mobile xrays to be performed. The family members are not sure what occurred and why xrays were not taken. Son doubting his choice to not transport patient to the hospital following fall.    Past Medical History:  Diagnosis Date  . Allergy    allergic Rhintis  . AVM (arteriovenous malformation) of colon    a. 2006 colonscopy  . Carotid stenosis    a. 09/2007: Carotid Doppler stable- stable bilateral stenosis 40-59% (from 06)  . Chronic diastolic CHF (congestive heart failure) (HCC)   . Chronic respiratory failure (HCC)   . Complication of anesthesia   . COPD (chronic obstructive pulmonary disease) (HCC)   . Dementia    Dementia in Hospital //Does O.K. at home.  . Diverticulosis   . GERD (gastroesophageal reflux disease)   . GI bleeding    a. when on coumadin in 2006 - colonoscopy @ that time showed 2 large right colon avm's, multiple small bowel avm's, fresh blood in prox small bowel.  .  Hyperlipidemia   . Hypertension   . Normocytic anemia 03/2012  . Osteoporosis   . PAF (paroxysmal atrial fibrillation) (HCC)    a. PAF dates back > 5 yrs, prev on coumadin->d/c 2006 2/2 GIB;   . PONV (postoperative nausea and vomiting)   . Pulmonary embolism (HCC)    SP IVC filter  . Tobacco abuse    a. ongoing - 50+ pack yr hx.  . Type II diabetes mellitus (HCC)    Past Surgical History:  Procedure Laterality Date  . CARDIAC CATHETERIZATION  1990's   clear  . COLONOSCOPY  08/2004  . ESOPHAGOGASTRODUODENOSCOPY  08/2004   Multiple   . ORIF TIBIA & FIBULA FRACTURES  05/2004  . SMALL BOWEL ENTEROSCOPY  03/2005   AVMS   Family History  Problem Relation Age of Onset  . Stroke Mother     a. believes she died suddenly @ 2058.  . Diabetes Mother   . Stroke Father     Died from cerebral hemorrhage @ 858  . Diabetes Father   . Cancer Sister     colon and stomach cancer  . Diabetes Brother   . Heart disease Brother   . Cancer Brother     died from lung cancer  . Heart disease Brother     MI  . Cancer  Sister     ? liver//brain cancer  . Heart disease Sister   . Diabetes Sister     diabetes and glaucoma  . Cancer Brother     pancreatic cancer  . Diabetes Son    Social History  Substance Use Topics  . Smoking status: Former Smoker    Packs/day: 0.50    Years: 50.00    Types: Cigarettes    Quit date: 04/21/2012  . Smokeless tobacco: Never Used  . Alcohol use No      Review of Systems Per HPI    Objective:   Physical Exam  Constitutional:  Thin, elderly female in wheelchair.   HENT:  Head: Normocephalic and atraumatic.  Eyes: Conjunctivae are normal.  Right eye closed.   Cardiovascular: Normal rate and regular rhythm.   Pulmonary/Chest: Effort normal and breath sounds normal.  Abdominal: There is no tenderness.  Musculoskeletal: She exhibits no edema.  Neurological:  Appears to fall asleep intermittently. Answers some questions appropriately. Follows some  commands. Is able to swallow acetaminophen.   Skin: Skin is warm and dry.  Vitals reviewed.     BP 110/78   Pulse 71   SpO2 94%  Wt Readings from Last 3 Encounters:  12/19/15 125 lb 4 oz (56.8 kg)  12/19/15 125 lb 4 oz (56.8 kg)  08/21/15 139 lb (63 kg)   Dg Lumbar Spine 1 View- difficult to obtain xrays due to patent pain level and difficulty ambulating/standing  Result Date: 04/04/2016 CLINICAL DATA:  Fall. EXAM: LUMBAR SPINE - 1 VIEW COMPARISON:  08/21/2015. FINDINGS: IVC filter noted at the L3 level. Diffuse multilevel degenerative change. No acute bony abnormality. If further evaluation for fracture is needed full lumbar spine series can be obtained. Aortoiliac atherosclerotic vascular disease. IMPRESSION: 1. Diffuse degenerative changes lumbar spine. No acute bony abnormality. If further evaluation for fracture is needed full lumbar spine series suggested. 2. IVC filter noted stable position. 3. Aortoiliac atherosclerotic vascular disease. Electronically Signed   By: Maisie Fus  Register   On: 04/04/2016 10:35   Dg Hip Unilat W Or W/o Pelvis 2-3 Views Left  Result Date: 04/04/2016 CLINICAL DATA:  Fall. EXAM: DG HIP (WITH OR WITHOUT PELVIS) 2-3V LEFT COMPARISON:  08/21/2015. FINDINGS: IVC filter noted. Aortoiliac atherosclerotic vascular calcification noted. No acute bony abnormality identified. Diffuse osteopenia degenerative change . IMPRESSION: 1. No acute bony abnormality identified. Diffuse osteopenia degenerative change. 2. Aortoiliac atherosclerotic vascular disease . Electronically Signed   By: Maisie Fus  Register   On: 04/04/2016 10:37   Meds ordered this encounter  Medications  . acetaminophen (TYLENOL) tablet 1,000 mg  patient reported improvement of pain following acetaminophen adminstration      Assessment & Plan:  Discussed with Dr. Para March  1. Fall, initial encounter - DG HIP UNILAT W OR W/O PELVIS 2-3 VIEWS LEFT; Future  2. Acute left-sided low back pain, with  sciatica presence unspecified - reviewed xray results with family members and options for pain management- outpatient/facility vs inpatient - agreed to try acetaminophen and PT to see if she regains any improvement of mobility- orders written on facility progress page  - DG HIP UNILAT W OR W/O PELVIS 2-3 VIEWS LEFT; Future - acetaminophen (TYLENOL) tablet 1,000 mg; Take 2 tablets (1,000 mg total) by mouth once.  3. Constipation, unspecified constipation type - order for miralax daily x 5 days written on facility report  - will determine follow up based on PT evaluation/recommendation and pain control.   Olean Ree, FNP-BC  [facility], [facility]  04/04/2016 12:54 PM

## 2016-04-05 ENCOUNTER — Ambulatory Visit: Payer: Self-pay | Admitting: Family Medicine

## 2016-04-05 ENCOUNTER — Telehealth: Payer: Self-pay | Admitting: Family Medicine

## 2016-04-05 DIAGNOSIS — J449 Chronic obstructive pulmonary disease, unspecified: Secondary | ICD-10-CM | POA: Diagnosis not present

## 2016-04-05 DIAGNOSIS — K5900 Constipation, unspecified: Secondary | ICD-10-CM | POA: Diagnosis not present

## 2016-04-05 DIAGNOSIS — I5032 Chronic diastolic (congestive) heart failure: Secondary | ICD-10-CM | POA: Diagnosis not present

## 2016-04-05 DIAGNOSIS — D519 Vitamin B12 deficiency anemia, unspecified: Secondary | ICD-10-CM | POA: Diagnosis not present

## 2016-04-05 DIAGNOSIS — Z87891 Personal history of nicotine dependence: Secondary | ICD-10-CM | POA: Diagnosis not present

## 2016-04-05 DIAGNOSIS — R2689 Other abnormalities of gait and mobility: Secondary | ICD-10-CM | POA: Diagnosis not present

## 2016-04-05 DIAGNOSIS — W19XXXD Unspecified fall, subsequent encounter: Secondary | ICD-10-CM | POA: Diagnosis not present

## 2016-04-05 DIAGNOSIS — J9610 Chronic respiratory failure, unspecified whether with hypoxia or hypercapnia: Secondary | ICD-10-CM | POA: Diagnosis not present

## 2016-04-05 DIAGNOSIS — I110 Hypertensive heart disease with heart failure: Secondary | ICD-10-CM | POA: Diagnosis not present

## 2016-04-05 DIAGNOSIS — Z95828 Presence of other vascular implants and grafts: Secondary | ICD-10-CM | POA: Diagnosis not present

## 2016-04-05 DIAGNOSIS — M5442 Lumbago with sciatica, left side: Secondary | ICD-10-CM | POA: Diagnosis not present

## 2016-04-05 DIAGNOSIS — Z7982 Long term (current) use of aspirin: Secondary | ICD-10-CM | POA: Diagnosis not present

## 2016-04-05 DIAGNOSIS — E119 Type 2 diabetes mellitus without complications: Secondary | ICD-10-CM | POA: Diagnosis not present

## 2016-04-05 DIAGNOSIS — I480 Paroxysmal atrial fibrillation: Secondary | ICD-10-CM | POA: Diagnosis not present

## 2016-04-05 NOTE — Telephone Encounter (Signed)
No fractures are seen in pelvis/hip or spine-this is re assuring How is she feeling?

## 2016-04-05 NOTE — Telephone Encounter (Signed)
Spoke with Gunnar Fusiaula  (daughter) and she informed me that patient is slightly better at least able to get up but they are still concerned about her increased lethargy and pallor noticed when they brought her in for the appointment.

## 2016-04-05 NOTE — Telephone Encounter (Signed)
Her labs from June were stable and re assuring.  [facility] contacted me today to order wheelchair prn and also clarify her miralax order - perhaps relieving the constipation would help.   If they are able to do a UA at her facility-that would also be helpful just to make sure she does not have a UTI.   - let me know if that is something they can do and if so order UA C and S  Thanks

## 2016-04-05 NOTE — Telephone Encounter (Signed)
Patient's daughter called to get the results of patient's x-ray.

## 2016-04-08 NOTE — Telephone Encounter (Signed)
Spoke with med tech at nursing home and they can do a UA and C&S, they do need a written order, they said they faxed you over orders for something else and if you want to just write out that UA and Culture order on that you can and fax it back to them at fax # [phone]

## 2016-04-08 NOTE — Telephone Encounter (Signed)
Thanks-the order to fax is in the IN box

## 2016-04-09 ENCOUNTER — Telehealth: Payer: Self-pay

## 2016-04-09 NOTE — Telephone Encounter (Signed)
V/M left from [facility] requesting verbal orders for 2 x a week for 4 weeks and 1 x a week for 1 week for strengthening, gait training and balance.

## 2016-04-09 NOTE — Telephone Encounter (Signed)
[facility] advised.

## 2016-04-09 NOTE — Telephone Encounter (Signed)
Form faxed back to [facility]

## 2016-04-09 NOTE — Telephone Encounter (Signed)
That is fine, please verbally ok it

## 2016-04-10 DIAGNOSIS — I110 Hypertensive heart disease with heart failure: Secondary | ICD-10-CM | POA: Diagnosis not present

## 2016-04-10 DIAGNOSIS — D519 Vitamin B12 deficiency anemia, unspecified: Secondary | ICD-10-CM | POA: Diagnosis not present

## 2016-04-10 DIAGNOSIS — E119 Type 2 diabetes mellitus without complications: Secondary | ICD-10-CM | POA: Diagnosis not present

## 2016-04-10 DIAGNOSIS — K5900 Constipation, unspecified: Secondary | ICD-10-CM | POA: Diagnosis not present

## 2016-04-10 DIAGNOSIS — R2689 Other abnormalities of gait and mobility: Secondary | ICD-10-CM | POA: Diagnosis not present

## 2016-04-10 DIAGNOSIS — Z87891 Personal history of nicotine dependence: Secondary | ICD-10-CM | POA: Diagnosis not present

## 2016-04-10 DIAGNOSIS — Z95828 Presence of other vascular implants and grafts: Secondary | ICD-10-CM | POA: Diagnosis not present

## 2016-04-10 DIAGNOSIS — M5442 Lumbago with sciatica, left side: Secondary | ICD-10-CM | POA: Diagnosis not present

## 2016-04-10 DIAGNOSIS — J9610 Chronic respiratory failure, unspecified whether with hypoxia or hypercapnia: Secondary | ICD-10-CM | POA: Diagnosis not present

## 2016-04-10 DIAGNOSIS — I480 Paroxysmal atrial fibrillation: Secondary | ICD-10-CM | POA: Diagnosis not present

## 2016-04-10 DIAGNOSIS — I5032 Chronic diastolic (congestive) heart failure: Secondary | ICD-10-CM | POA: Diagnosis not present

## 2016-04-10 DIAGNOSIS — Z7982 Long term (current) use of aspirin: Secondary | ICD-10-CM | POA: Diagnosis not present

## 2016-04-10 DIAGNOSIS — J449 Chronic obstructive pulmonary disease, unspecified: Secondary | ICD-10-CM | POA: Diagnosis not present

## 2016-04-10 DIAGNOSIS — W19XXXD Unspecified fall, subsequent encounter: Secondary | ICD-10-CM | POA: Diagnosis not present

## 2016-04-12 DIAGNOSIS — I480 Paroxysmal atrial fibrillation: Secondary | ICD-10-CM | POA: Diagnosis not present

## 2016-04-12 DIAGNOSIS — D519 Vitamin B12 deficiency anemia, unspecified: Secondary | ICD-10-CM | POA: Diagnosis not present

## 2016-04-12 DIAGNOSIS — Z7982 Long term (current) use of aspirin: Secondary | ICD-10-CM | POA: Diagnosis not present

## 2016-04-12 DIAGNOSIS — Z95828 Presence of other vascular implants and grafts: Secondary | ICD-10-CM | POA: Diagnosis not present

## 2016-04-12 DIAGNOSIS — J9610 Chronic respiratory failure, unspecified whether with hypoxia or hypercapnia: Secondary | ICD-10-CM | POA: Diagnosis not present

## 2016-04-12 DIAGNOSIS — M5442 Lumbago with sciatica, left side: Secondary | ICD-10-CM | POA: Diagnosis not present

## 2016-04-12 DIAGNOSIS — J449 Chronic obstructive pulmonary disease, unspecified: Secondary | ICD-10-CM | POA: Diagnosis not present

## 2016-04-12 DIAGNOSIS — Z87891 Personal history of nicotine dependence: Secondary | ICD-10-CM | POA: Diagnosis not present

## 2016-04-12 DIAGNOSIS — W19XXXD Unspecified fall, subsequent encounter: Secondary | ICD-10-CM | POA: Diagnosis not present

## 2016-04-12 DIAGNOSIS — K5900 Constipation, unspecified: Secondary | ICD-10-CM | POA: Diagnosis not present

## 2016-04-12 DIAGNOSIS — R2689 Other abnormalities of gait and mobility: Secondary | ICD-10-CM | POA: Diagnosis not present

## 2016-04-12 DIAGNOSIS — I110 Hypertensive heart disease with heart failure: Secondary | ICD-10-CM | POA: Diagnosis not present

## 2016-04-12 DIAGNOSIS — E119 Type 2 diabetes mellitus without complications: Secondary | ICD-10-CM | POA: Diagnosis not present

## 2016-04-12 DIAGNOSIS — I5032 Chronic diastolic (congestive) heart failure: Secondary | ICD-10-CM | POA: Diagnosis not present

## 2016-04-15 DIAGNOSIS — K5900 Constipation, unspecified: Secondary | ICD-10-CM | POA: Diagnosis not present

## 2016-04-15 DIAGNOSIS — I5032 Chronic diastolic (congestive) heart failure: Secondary | ICD-10-CM | POA: Diagnosis not present

## 2016-04-15 DIAGNOSIS — E119 Type 2 diabetes mellitus without complications: Secondary | ICD-10-CM | POA: Diagnosis not present

## 2016-04-15 DIAGNOSIS — Z95828 Presence of other vascular implants and grafts: Secondary | ICD-10-CM | POA: Diagnosis not present

## 2016-04-15 DIAGNOSIS — I110 Hypertensive heart disease with heart failure: Secondary | ICD-10-CM | POA: Diagnosis not present

## 2016-04-15 DIAGNOSIS — D519 Vitamin B12 deficiency anemia, unspecified: Secondary | ICD-10-CM | POA: Diagnosis not present

## 2016-04-15 DIAGNOSIS — J449 Chronic obstructive pulmonary disease, unspecified: Secondary | ICD-10-CM | POA: Diagnosis not present

## 2016-04-15 DIAGNOSIS — W19XXXD Unspecified fall, subsequent encounter: Secondary | ICD-10-CM | POA: Diagnosis not present

## 2016-04-15 DIAGNOSIS — R2689 Other abnormalities of gait and mobility: Secondary | ICD-10-CM | POA: Diagnosis not present

## 2016-04-15 DIAGNOSIS — I480 Paroxysmal atrial fibrillation: Secondary | ICD-10-CM | POA: Diagnosis not present

## 2016-04-15 DIAGNOSIS — Z87891 Personal history of nicotine dependence: Secondary | ICD-10-CM | POA: Diagnosis not present

## 2016-04-15 DIAGNOSIS — M5442 Lumbago with sciatica, left side: Secondary | ICD-10-CM | POA: Diagnosis not present

## 2016-04-15 DIAGNOSIS — Z7982 Long term (current) use of aspirin: Secondary | ICD-10-CM | POA: Diagnosis not present

## 2016-04-15 DIAGNOSIS — J9610 Chronic respiratory failure, unspecified whether with hypoxia or hypercapnia: Secondary | ICD-10-CM | POA: Diagnosis not present

## 2016-04-17 ENCOUNTER — Telehealth: Payer: Self-pay

## 2016-04-17 NOTE — Telephone Encounter (Signed)
[facility] sent me a request for it on 10/16- I believe it is to assist with positioning better to relieve back pain and help with transfers (the physical therapist may also be able to answer that question) Thanks

## 2016-04-17 NOTE — Telephone Encounter (Signed)
Chrissie NoaWilliam Bryant pts son and POA under document tab; Mr Lorelee Newippett left v/m wanting to know why hospital bed was ordered for pt. Mr Lorelee Newippett received notification that Dr Milinda Antisower had authorized a hospital bed for pt.

## 2016-04-18 DIAGNOSIS — E119 Type 2 diabetes mellitus without complications: Secondary | ICD-10-CM | POA: Diagnosis not present

## 2016-04-18 DIAGNOSIS — I480 Paroxysmal atrial fibrillation: Secondary | ICD-10-CM | POA: Diagnosis not present

## 2016-04-18 DIAGNOSIS — I5032 Chronic diastolic (congestive) heart failure: Secondary | ICD-10-CM | POA: Diagnosis not present

## 2016-04-18 DIAGNOSIS — Z7982 Long term (current) use of aspirin: Secondary | ICD-10-CM | POA: Diagnosis not present

## 2016-04-18 DIAGNOSIS — W19XXXD Unspecified fall, subsequent encounter: Secondary | ICD-10-CM | POA: Diagnosis not present

## 2016-04-18 DIAGNOSIS — R2689 Other abnormalities of gait and mobility: Secondary | ICD-10-CM | POA: Diagnosis not present

## 2016-04-18 DIAGNOSIS — Z87891 Personal history of nicotine dependence: Secondary | ICD-10-CM | POA: Diagnosis not present

## 2016-04-18 DIAGNOSIS — J449 Chronic obstructive pulmonary disease, unspecified: Secondary | ICD-10-CM | POA: Diagnosis not present

## 2016-04-18 DIAGNOSIS — I110 Hypertensive heart disease with heart failure: Secondary | ICD-10-CM | POA: Diagnosis not present

## 2016-04-18 DIAGNOSIS — J9610 Chronic respiratory failure, unspecified whether with hypoxia or hypercapnia: Secondary | ICD-10-CM | POA: Diagnosis not present

## 2016-04-18 DIAGNOSIS — Z95828 Presence of other vascular implants and grafts: Secondary | ICD-10-CM | POA: Diagnosis not present

## 2016-04-18 DIAGNOSIS — M5442 Lumbago with sciatica, left side: Secondary | ICD-10-CM | POA: Diagnosis not present

## 2016-04-18 DIAGNOSIS — D519 Vitamin B12 deficiency anemia, unspecified: Secondary | ICD-10-CM | POA: Diagnosis not present

## 2016-04-18 DIAGNOSIS — K5900 Constipation, unspecified: Secondary | ICD-10-CM | POA: Diagnosis not present

## 2016-04-18 NOTE — Telephone Encounter (Signed)
Pt's son notified of Dr. Royden Purlower's comments and verbalized understanding [facility] and see why it was ordered

## 2016-04-22 DIAGNOSIS — D519 Vitamin B12 deficiency anemia, unspecified: Secondary | ICD-10-CM | POA: Diagnosis not present

## 2016-04-22 DIAGNOSIS — Z95828 Presence of other vascular implants and grafts: Secondary | ICD-10-CM | POA: Diagnosis not present

## 2016-04-22 DIAGNOSIS — Z7982 Long term (current) use of aspirin: Secondary | ICD-10-CM | POA: Diagnosis not present

## 2016-04-22 DIAGNOSIS — R2689 Other abnormalities of gait and mobility: Secondary | ICD-10-CM | POA: Diagnosis not present

## 2016-04-22 DIAGNOSIS — M5442 Lumbago with sciatica, left side: Secondary | ICD-10-CM | POA: Diagnosis not present

## 2016-04-22 DIAGNOSIS — Z87891 Personal history of nicotine dependence: Secondary | ICD-10-CM | POA: Diagnosis not present

## 2016-04-22 DIAGNOSIS — J9610 Chronic respiratory failure, unspecified whether with hypoxia or hypercapnia: Secondary | ICD-10-CM | POA: Diagnosis not present

## 2016-04-22 DIAGNOSIS — I110 Hypertensive heart disease with heart failure: Secondary | ICD-10-CM | POA: Diagnosis not present

## 2016-04-22 DIAGNOSIS — E119 Type 2 diabetes mellitus without complications: Secondary | ICD-10-CM | POA: Diagnosis not present

## 2016-04-22 DIAGNOSIS — W19XXXD Unspecified fall, subsequent encounter: Secondary | ICD-10-CM | POA: Diagnosis not present

## 2016-04-22 DIAGNOSIS — I5032 Chronic diastolic (congestive) heart failure: Secondary | ICD-10-CM | POA: Diagnosis not present

## 2016-04-22 DIAGNOSIS — J449 Chronic obstructive pulmonary disease, unspecified: Secondary | ICD-10-CM | POA: Diagnosis not present

## 2016-04-22 DIAGNOSIS — K5900 Constipation, unspecified: Secondary | ICD-10-CM | POA: Diagnosis not present

## 2016-04-22 DIAGNOSIS — I480 Paroxysmal atrial fibrillation: Secondary | ICD-10-CM | POA: Diagnosis not present

## 2016-04-25 DIAGNOSIS — E119 Type 2 diabetes mellitus without complications: Secondary | ICD-10-CM | POA: Diagnosis not present

## 2016-04-25 DIAGNOSIS — D519 Vitamin B12 deficiency anemia, unspecified: Secondary | ICD-10-CM | POA: Diagnosis not present

## 2016-04-25 DIAGNOSIS — I509 Heart failure, unspecified: Secondary | ICD-10-CM | POA: Diagnosis not present

## 2016-04-25 DIAGNOSIS — Z95828 Presence of other vascular implants and grafts: Secondary | ICD-10-CM | POA: Diagnosis not present

## 2016-04-25 DIAGNOSIS — M5442 Lumbago with sciatica, left side: Secondary | ICD-10-CM | POA: Diagnosis not present

## 2016-04-25 DIAGNOSIS — G4733 Obstructive sleep apnea (adult) (pediatric): Secondary | ICD-10-CM | POA: Diagnosis not present

## 2016-04-25 DIAGNOSIS — I110 Hypertensive heart disease with heart failure: Secondary | ICD-10-CM | POA: Diagnosis not present

## 2016-04-25 DIAGNOSIS — Z87891 Personal history of nicotine dependence: Secondary | ICD-10-CM | POA: Diagnosis not present

## 2016-04-25 DIAGNOSIS — W19XXXD Unspecified fall, subsequent encounter: Secondary | ICD-10-CM | POA: Diagnosis not present

## 2016-04-25 DIAGNOSIS — Z7982 Long term (current) use of aspirin: Secondary | ICD-10-CM | POA: Diagnosis not present

## 2016-04-25 DIAGNOSIS — I480 Paroxysmal atrial fibrillation: Secondary | ICD-10-CM | POA: Diagnosis not present

## 2016-04-25 DIAGNOSIS — J449 Chronic obstructive pulmonary disease, unspecified: Secondary | ICD-10-CM | POA: Diagnosis not present

## 2016-04-25 DIAGNOSIS — K5900 Constipation, unspecified: Secondary | ICD-10-CM | POA: Diagnosis not present

## 2016-04-25 DIAGNOSIS — J9690 Respiratory failure, unspecified, unspecified whether with hypoxia or hypercapnia: Secondary | ICD-10-CM | POA: Diagnosis not present

## 2016-04-25 DIAGNOSIS — J9610 Chronic respiratory failure, unspecified whether with hypoxia or hypercapnia: Secondary | ICD-10-CM | POA: Diagnosis not present

## 2016-04-25 DIAGNOSIS — R2689 Other abnormalities of gait and mobility: Secondary | ICD-10-CM | POA: Diagnosis not present

## 2016-04-25 DIAGNOSIS — I5032 Chronic diastolic (congestive) heart failure: Secondary | ICD-10-CM | POA: Diagnosis not present

## 2016-04-29 DIAGNOSIS — I110 Hypertensive heart disease with heart failure: Secondary | ICD-10-CM | POA: Diagnosis not present

## 2016-04-29 DIAGNOSIS — K5900 Constipation, unspecified: Secondary | ICD-10-CM | POA: Diagnosis not present

## 2016-04-29 DIAGNOSIS — D519 Vitamin B12 deficiency anemia, unspecified: Secondary | ICD-10-CM | POA: Diagnosis not present

## 2016-04-29 DIAGNOSIS — J449 Chronic obstructive pulmonary disease, unspecified: Secondary | ICD-10-CM | POA: Diagnosis not present

## 2016-04-29 DIAGNOSIS — W19XXXD Unspecified fall, subsequent encounter: Secondary | ICD-10-CM | POA: Diagnosis not present

## 2016-04-29 DIAGNOSIS — I480 Paroxysmal atrial fibrillation: Secondary | ICD-10-CM | POA: Diagnosis not present

## 2016-04-29 DIAGNOSIS — E119 Type 2 diabetes mellitus without complications: Secondary | ICD-10-CM | POA: Diagnosis not present

## 2016-04-29 DIAGNOSIS — J9610 Chronic respiratory failure, unspecified whether with hypoxia or hypercapnia: Secondary | ICD-10-CM | POA: Diagnosis not present

## 2016-04-29 DIAGNOSIS — M5442 Lumbago with sciatica, left side: Secondary | ICD-10-CM | POA: Diagnosis not present

## 2016-04-29 DIAGNOSIS — Z95828 Presence of other vascular implants and grafts: Secondary | ICD-10-CM | POA: Diagnosis not present

## 2016-04-29 DIAGNOSIS — I5032 Chronic diastolic (congestive) heart failure: Secondary | ICD-10-CM | POA: Diagnosis not present

## 2016-04-29 DIAGNOSIS — Z87891 Personal history of nicotine dependence: Secondary | ICD-10-CM | POA: Diagnosis not present

## 2016-04-29 DIAGNOSIS — R2689 Other abnormalities of gait and mobility: Secondary | ICD-10-CM | POA: Diagnosis not present

## 2016-04-29 DIAGNOSIS — Z7982 Long term (current) use of aspirin: Secondary | ICD-10-CM | POA: Diagnosis not present

## 2016-05-02 ENCOUNTER — Telehealth: Payer: Self-pay | Admitting: Family Medicine

## 2016-05-02 DIAGNOSIS — W19XXXD Unspecified fall, subsequent encounter: Secondary | ICD-10-CM | POA: Diagnosis not present

## 2016-05-02 DIAGNOSIS — M5442 Lumbago with sciatica, left side: Secondary | ICD-10-CM | POA: Diagnosis not present

## 2016-05-02 DIAGNOSIS — I110 Hypertensive heart disease with heart failure: Secondary | ICD-10-CM | POA: Diagnosis not present

## 2016-05-02 DIAGNOSIS — I480 Paroxysmal atrial fibrillation: Secondary | ICD-10-CM | POA: Diagnosis not present

## 2016-05-02 DIAGNOSIS — K5900 Constipation, unspecified: Secondary | ICD-10-CM | POA: Diagnosis not present

## 2016-05-02 DIAGNOSIS — J449 Chronic obstructive pulmonary disease, unspecified: Secondary | ICD-10-CM | POA: Diagnosis not present

## 2016-05-02 DIAGNOSIS — D519 Vitamin B12 deficiency anemia, unspecified: Secondary | ICD-10-CM | POA: Diagnosis not present

## 2016-05-02 DIAGNOSIS — Z7982 Long term (current) use of aspirin: Secondary | ICD-10-CM | POA: Diagnosis not present

## 2016-05-02 DIAGNOSIS — Z87891 Personal history of nicotine dependence: Secondary | ICD-10-CM | POA: Diagnosis not present

## 2016-05-02 DIAGNOSIS — I5032 Chronic diastolic (congestive) heart failure: Secondary | ICD-10-CM | POA: Diagnosis not present

## 2016-05-02 DIAGNOSIS — R2689 Other abnormalities of gait and mobility: Secondary | ICD-10-CM | POA: Diagnosis not present

## 2016-05-02 DIAGNOSIS — J9610 Chronic respiratory failure, unspecified whether with hypoxia or hypercapnia: Secondary | ICD-10-CM | POA: Diagnosis not present

## 2016-05-02 DIAGNOSIS — Z95828 Presence of other vascular implants and grafts: Secondary | ICD-10-CM | POA: Diagnosis not present

## 2016-05-02 DIAGNOSIS — E119 Type 2 diabetes mellitus without complications: Secondary | ICD-10-CM | POA: Diagnosis not present

## 2016-05-02 NOTE — Telephone Encounter (Signed)
Dr. Milinda Antisower sent [facility] an [facility] with concerns about pt's eyesight. Dr. Milinda Antisower placed a note on it saying  "Please let family know I was alerted re: vision issues, would they like an eye doctor referral- sounds like she needs to be seen"  Called son back and spoke with him. He said he is aware of her eye issues and wants Dr. Royden Purlower's opinion regarding this situation. Pt has an eye doctor she [facility]. The last time she was there they said her cataracts were really bad and that she needs surgery to have them removed. Pt's son said he feels like he is "between a rock and a hard place" he is worried that with her worsening dementia that she would mess with her eyes or rub them after surgery and he knows that messing with your eyes during the recovery stage can be really dangerous and make her eye issues worse if she damages something. Son questions if you think that they should proceed with the cataract surgery or does the risk out weigh the benefits and also if she had surgery Dr. Milinda Antisower would have to clear her for the surgery and do you think you would even clear her for surgery given her overall health/dementia? Please advise

## 2016-05-02 NOTE — Telephone Encounter (Signed)
[name] returned your call.

## 2016-05-02 NOTE — Telephone Encounter (Signed)
I agree with him- I do not think there is a way we could assure her safety under those conditions - he can check in with her eye doctor as well for another opinion if he wants to (I am not as familiar with the protocol post surgery)

## 2016-05-03 NOTE — Telephone Encounter (Addendum)
Pt's son notified of Dr. Royden Purlower's comments and verbalized understanding, he will check in with the eye doc to see if they recommend anything else for pt  Form placed back in your inbox so you can write your recommendations on the nursing home form

## 2016-05-11 ENCOUNTER — Encounter (HOSPITAL_COMMUNITY): Payer: Self-pay | Admitting: Emergency Medicine

## 2016-05-11 ENCOUNTER — Emergency Department (HOSPITAL_COMMUNITY): Payer: Medicare Other

## 2016-05-11 ENCOUNTER — Emergency Department (HOSPITAL_COMMUNITY)
Admission: EM | Admit: 2016-05-11 | Discharge: 2016-05-11 | Disposition: A | Discharge status: Home / Self Care | Payer: Medicare Other | Attending: Emergency Medicine | Admitting: Emergency Medicine

## 2016-05-11 DIAGNOSIS — E119 Type 2 diabetes mellitus without complications: Secondary | ICD-10-CM | POA: Diagnosis not present

## 2016-05-11 DIAGNOSIS — M79652 Pain in left thigh: Secondary | ICD-10-CM | POA: Diagnosis not present

## 2016-05-11 DIAGNOSIS — I110 Hypertensive heart disease with heart failure: Secondary | ICD-10-CM | POA: Insufficient documentation

## 2016-05-11 DIAGNOSIS — R531 Weakness: Secondary | ICD-10-CM | POA: Diagnosis not present

## 2016-05-11 DIAGNOSIS — S22080A Wedge compression fracture of T11-T12 vertebra, initial encounter for closed fracture: Secondary | ICD-10-CM | POA: Diagnosis not present

## 2016-05-11 DIAGNOSIS — Z7984 Long term (current) use of oral hypoglycemic drugs: Secondary | ICD-10-CM | POA: Diagnosis not present

## 2016-05-11 DIAGNOSIS — S3992XA Unspecified injury of lower back, initial encounter: Secondary | ICD-10-CM | POA: Diagnosis not present

## 2016-05-11 DIAGNOSIS — W19XXXA Unspecified fall, initial encounter: Secondary | ICD-10-CM | POA: Insufficient documentation

## 2016-05-11 DIAGNOSIS — S7002XA Contusion of left hip, initial encounter: Secondary | ICD-10-CM

## 2016-05-11 DIAGNOSIS — S22000A Wedge compression fracture of unspecified thoracic vertebra, initial encounter for closed fracture: Secondary | ICD-10-CM

## 2016-05-11 DIAGNOSIS — Z87891 Personal history of nicotine dependence: Secondary | ICD-10-CM | POA: Insufficient documentation

## 2016-05-11 DIAGNOSIS — M545 Low back pain: Secondary | ICD-10-CM | POA: Diagnosis not present

## 2016-05-11 DIAGNOSIS — T148XXA Other injury of unspecified body region, initial encounter: Secondary | ICD-10-CM | POA: Diagnosis not present

## 2016-05-11 DIAGNOSIS — S299XXA Unspecified injury of thorax, initial encounter: Secondary | ICD-10-CM | POA: Diagnosis present

## 2016-05-11 DIAGNOSIS — Y939 Activity, unspecified: Secondary | ICD-10-CM | POA: Diagnosis not present

## 2016-05-11 DIAGNOSIS — S22080D Wedge compression fracture of T11-T12 vertebra, subsequent encounter for fracture with routine healing: Secondary | ICD-10-CM | POA: Diagnosis not present

## 2016-05-11 DIAGNOSIS — Y999 Unspecified external cause status: Secondary | ICD-10-CM | POA: Insufficient documentation

## 2016-05-11 DIAGNOSIS — I5032 Chronic diastolic (congestive) heart failure: Secondary | ICD-10-CM | POA: Diagnosis not present

## 2016-05-11 DIAGNOSIS — J449 Chronic obstructive pulmonary disease, unspecified: Secondary | ICD-10-CM | POA: Insufficient documentation

## 2016-05-11 DIAGNOSIS — Z7982 Long term (current) use of aspirin: Secondary | ICD-10-CM | POA: Diagnosis not present

## 2016-05-11 DIAGNOSIS — S79922A Unspecified injury of left thigh, initial encounter: Secondary | ICD-10-CM | POA: Diagnosis not present

## 2016-05-11 DIAGNOSIS — S329XXA Fracture of unspecified parts of lumbosacral spine and pelvis, initial encounter for closed fracture: Secondary | ICD-10-CM | POA: Diagnosis not present

## 2016-05-11 DIAGNOSIS — Y929 Unspecified place or not applicable: Secondary | ICD-10-CM | POA: Insufficient documentation

## 2016-05-11 DIAGNOSIS — S79912A Unspecified injury of left hip, initial encounter: Secondary | ICD-10-CM | POA: Diagnosis not present

## 2016-05-11 MED ORDER — HYDROCODONE-ACETAMINOPHEN 5-325 MG PO TABS: 1.0000 | ORAL_TABLET | Freq: Four times a day (QID) | ORAL | 0 refills | Status: AC | PRN

## 2016-05-11 NOTE — Discharge Instructions (Addendum)
Return to the emergency department if symptoms significantly worsen or change. 

## 2016-05-11 NOTE — ED Triage Notes (Signed)
[facility] of left thigh pain post unwitnessed fall; pt alert to self per normal.

## 2016-05-11 NOTE — ED Notes (Signed)
Patient transported to CT 

## 2016-05-11 NOTE — ED Notes (Signed)
Bed: [id number] Expected date:  Expected time:  Means of arrival:  Comments: 82 yo fall

## 2016-05-11 NOTE — ED Provider Notes (Signed)
[facility]-EMERGENCY DEPT Provider Note   CSN: [id number] Arrival date & time: 05/11/16  1557     History   Chief Complaint Chief Complaint  Patient presents with  . Fall  . Leg Pain    HPI [name] is a 82 y.o. female.  Patient is an 82 year old female with past medical history of COPD, hypertension, and atrial fibrillation. She also has a history of dementia and is a resident of a local nursing home. She is brought by EMS after an apparent fall. She tells me she was trying to get up when she lost her balance and fell. This was unwitnessed by staff. She is complaining of discomfort in her left hip and pelvis.  Remainder of history is somewhat limited secondary to her dementia, however she has no other complaints.   The history is provided by the patient and the EMS personnel.  Fall  This is a new problem. The current episode started less than 1 hour ago. The problem occurs constantly. The problem has not changed since onset.Exacerbated by: Movement and palpation. Nothing relieves the symptoms. She has tried nothing for the symptoms.  Leg Pain      Past Medical History:  Diagnosis Date  . Allergy    allergic Rhintis  . AVM (arteriovenous malformation) of colon    a. 2006 colonscopy  . Carotid stenosis    a. 09/2007: Carotid Doppler stable- stable bilateral stenosis 40-59% (from 06)  . Chronic diastolic CHF (congestive heart failure) (HCC)   . Chronic respiratory failure (HCC)   . Complication of anesthesia   . COPD (chronic obstructive pulmonary disease) (HCC)   . Dementia    Dementia in Hospital //Does O.K. at home.  . Diverticulosis   . GERD (gastroesophageal reflux disease)   . GI bleeding    a. when on coumadin in 2006 - colonoscopy @ that time showed 2 large right colon avm's, multiple small bowel avm's, fresh blood in prox small bowel.  . Hyperlipidemia   . Hypertension   . Normocytic anemia 03/2012  . Osteoporosis   . PAF (paroxysmal atrial fibrillation)  (HCC)    a. PAF dates back > 5 yrs, prev on coumadin->d/c 2006 2/2 GIB;   . PONV (postoperative nausea and vomiting)   . Pulmonary embolism (HCC)    SP IVC filter  . Tobacco abuse    a. ongoing - 50+ pack yr hx.  . Type II diabetes mellitus [facility](HCC)     Patient Active Problem List   Diagnosis Date Noted  . Low back pain 08/21/2015  . Chronic obstructive pulmonary disease, unspecified copd, unspecified chronic bronchitis type 07/13/2015  . Encounter for Medicare annual wellness exam 12/12/2014  . Routine general medical examination at a health care facility 12/12/2014  . Abnormal urine odor 12/12/2014  . Syncope 08/26/2013  . Bradycardia 08/26/2013  . Hip pain 06/30/2013  . Fall at nursing home 06/30/2013  . Near syncope 09/30/2012  . Headache(784.0) 09/30/2012  . Failure to thrive in adult 09/25/2012  . Depression 09/25/2012  . Abdominal pain, lower 09/25/2012  . Vitamin B12 deficiency 09/08/2012  . Hypertrophic toenail 07/21/2012  . OSA (obstructive sleep apnea) 07/15/2012  . Personal history of pulmonary embolism 06/17/2012  . Chest pain 06/16/2012  . Esophageal dysmotility suspected 06/15/2012  . GERD (gastroesophageal reflux disease) 06/15/2012  . Acute and chronic respiratory failure 04/23/2012  . Dementia 04/21/2012  . Chronic diastolic heart failure (HCC) 04/21/2012  . COPD (chronic obstructive pulmonary disease) (HCC) 04/21/2012  .  Mixed incontinence urge and stress 01/04/2011  . Vitamin D deficiency 07/14/2010  . ANEMIA 01/24/2009  . Former smoker 11/04/2007  . MORTON'S NEUROMA 11/03/2007  . MACULAR DEGENERATION 11/03/2007  . Allergic rhinitis 11/03/2007  . Osteoporosis 11/03/2007  . Diabetes type 2, controlled (HCC) 11/03/2007  . HYPERCHOLESTEROLEMIA 11/25/2006    Past Surgical History:  Procedure Laterality Date  . CARDIAC CATHETERIZATION  1990's   clear  . COLONOSCOPY  08/2004  . ESOPHAGOGASTRODUODENOSCOPY  08/2004   Multiple   . ORIF TIBIA & FIBULA  FRACTURES  05/2004  . SMALL BOWEL ENTEROSCOPY  03/2005   AVMS    OB History    No data available       Home Medications    Prior to Admission medications   Medication Sig Start Date End Date Taking? Authorizing Provider  acetaminophen (TYLENOL) 325 MG tablet Take 650 mg by mouth every 4 (four) hours as needed for mild pain or headache.    Historical Provider, MD  aspirin 81 MG chewable tablet Chew 81 mg by mouth daily.    Historical Provider, MD  atorvastatin (LIPITOR) 10 MG tablet Take 1 tablet (10 mg total) by mouth daily. 06/01/13   Marne A Tower, MD  budesonide (PULMICORT FLEXHALER) 180 MCG/ACT inhaler Inhale 1 puff into the lungs 2 (two) times daily. 11/10/12   Kalman ShanMurali Ramaswamy, MD  Cholecalciferol (VITAMIN D-3) 1000 UNITS CAPS Take 4,000 Units by mouth daily.     Historical Provider, MD  docusate sodium (COLACE) 100 MG capsule Take 100 mg by mouth 2 (two) times daily.  11/26/12   Eustaquio BoydenJavier Gutierrez, MD  donepezil (ARICEPT) 10 MG tablet Take 10 mg by mouth daily.  09/08/12   Judy PimpleMarne A Tower, MD  fluticasone (FLONASE) 50 MCG/ACT nasal spray Place 1 spray into the nose daily.     Historical Provider, MD  hydrALAZINE (APRESOLINE) 25 MG tablet Take 1 tablet (25 mg total) by mouth 2 (two) times daily. 09/23/13   Beatrice LecherScott T Weaver, PA-C  lansoprazole (PREVACID) 15 MG capsule Take 1 capsule (15 mg total) by mouth daily. 11/20/12   Iva Booparl E Gessner, MD  levalbuterol [facility](XOPENEX HFA) 45 MCG/ACT inhaler Inhale 1-2 puffs into the lungs every 4 (four) hours as needed for wheezing or shortness of breath.     Historical Provider, MD  magnesium hydroxide (MILK OF MAGNESIA) 400 MG/5ML suspension Take 15 mLs by mouth daily as needed for constipation.    Historical Provider, MD  metFORMIN (GLUCOPHAGE) 1000 MG tablet Take 1 tablet (1,000 mg total) by mouth 2 (two) times daily with a meal. 06/09/14   Judy PimpleMarne A Tower, MD  mirtazapine (REMERON) 30 MG tablet Take 1 tablet (30 mg total) by mouth at bedtime. 12/19/15   Judy PimpleMarne A  Tower, MD  polyethylene glycol (MIRALAX / GLYCOLAX) packet Take 17 g by mouth daily as needed (constipation).    Historical Provider, MD  tiotropium (SPIRIVA HANDIHALER) 18 MCG inhalation capsule Place 1 capsule (18 mcg total) into inhaler and inhale daily. 11/30/12   Kalman ShanMurali Ramaswamy, MD  Tiotropium Bromide Monohydrate (SPIRIVA RESPIMAT) 2.5 MCG/ACT AERS Inhale 2 puffs into the lungs daily. 07/13/15   Kalman ShanMurali Ramaswamy, MD  vitamin B-12 (CYANOCOBALAMIN) 1000 MCG tablet Take 4,000 mcg by mouth every morning.     Historical Provider, MD    Family History Family History  Problem Relation Age of Onset  . Stroke Mother     a. believes she died suddenly @ 7558.  . Diabetes Mother   . Stroke Father  Died from cerebral hemorrhage @ 58  . Diabetes Father   . Cancer Sister     colon and stomach cancer  . Cancer Brother     died from lung cancer  . Cancer Sister     ? liver//brain cancer  . Heart disease Sister   . Diabetes Sister     diabetes and glaucoma  . Cancer Brother     pancreatic cancer  . Diabetes Son   . Diabetes Brother   . Heart disease Brother   . Heart disease Brother     MI    Social History Social History  Substance Use Topics  . Smoking status: Former Smoker    Packs/day: 0.50    Years: 50.00    Types: Cigarettes    Quit date: 04/21/2012  . Smokeless tobacco: Never Used  . Alcohol use No     Allergies   Azithromycin; Codeine; Coumadin [warfarin]; and Oxybutynin chloride er   Review of Systems Review of Systems  All other systems reviewed and are negative.    Physical Exam Updated Vital Signs BP 140/67 (BP Location: Left Arm)   Pulse 60   Temp 98.1 F (36.7 C) (Oral)   Resp 20   SpO2 97%   Physical Exam  Constitutional: She is oriented to person, place, and time. She appears well-developed and well-nourished. No distress.  Patient is demented, but is alert and appropriate.  HENT:  Head: Normocephalic and atraumatic.  Neck: Normal range of  motion. Neck supple.  Cardiovascular: Normal rate and regular rhythm.  Exam reveals no gallop and no friction rub.   No murmur heard. Pulmonary/Chest: Effort normal and breath sounds normal. No respiratory distress. She has no wheezes.  Abdominal: Soft. Bowel sounds are normal. She exhibits no distension. There is no tenderness.  Musculoskeletal: Normal range of motion.  There is tenderness to palpation over the left lateral hip, however no obvious deformity. She is able to move her left leg. Sensation, motor is intact to the foot.  Neurological: She is alert and oriented to person, place, and time.  Skin: Skin is warm and dry. She is not diaphoretic.  Nursing note and vitals reviewed.    ED Treatments / Results  Labs (all labs ordered are listed, but only abnormal results are displayed) Labs Reviewed - No data to display  EKG  EKG Interpretation None       Radiology No results found.  Procedures Procedures (including critical care time)  Medications Ordered in ED Medications - No data to display   Initial Impression / Assessment and Plan / ED Course  I have reviewed the triage vital signs and the nursing notes.  Pertinent labs & imaging results that were available during my care of the patient were reviewed by me and considered in my medical decision making (see chart for details).  Clinical Course     XRays of the hip, pelvis, and femur are negative for fracture. Will discharge to home, to return prn. There is no cervical spine tenderness or step-off. Cervical spine was cleared clinically.  Final Clinical Impressions(s) / ED Diagnoses   Final diagnoses:  None    New Prescriptions New Prescriptions   No medications on file     Geoffery Lyons, MD 05/11/16 1701

## 2016-05-11 NOTE — ED Notes (Signed)
Pt oxygen saturation 84-89% on RA with sleeping. Family verbalizes "seems like she it beeps and then she takes a deep breath and it comes back up." Pt known hx COPD. Pt placed on 2 lpm while resting. Delo MD aware of all the above.

## 2016-05-11 NOTE — ED Notes (Signed)
Pt transported to DG.  

## 2016-05-11 NOTE — ED Notes (Signed)
[facility] called for transport. [facility].

## 2016-05-11 NOTE — ED Notes (Signed)
Family at bedside reports recent lower back injury 6 weeks ago. Request lumber scan related to unwitnessed fall today. Delo MD aware. Evette updated delay in discharge.

## 2016-05-22 ENCOUNTER — Telehealth: Payer: Self-pay | Admitting: *Deleted

## 2016-05-22 MED ORDER — DOCUSATE SODIUM 50 MG/5ML PO LIQD: 100.0000 mg | Freq: Two times a day (BID) | ORAL | 11 refills | Status: AC | PRN

## 2016-05-22 NOTE — Telephone Encounter (Signed)
Heather RN with [facility] left message. She said she received an order from Dr. Milinda Antisower to change pt's colace to the liquid Rx. Herbert SetaHeather said they would need an order faxed to them with the strength of the med and the quantity and dose, they would need a new order/Rx faxed to them with all of that info before they can make the change.   Fax # [phone]

## 2016-05-22 NOTE — Telephone Encounter (Signed)
I printed the px to fax  I wrote for 100 mg bid prn - if they need it scheduled instead of prn let me know

## 2016-05-22 NOTE — Telephone Encounter (Signed)
Rx faxed to [facility].  

## 2016-05-26 DIAGNOSIS — J449 Chronic obstructive pulmonary disease, unspecified: Secondary | ICD-10-CM | POA: Diagnosis not present

## 2016-06-10 ENCOUNTER — Telehealth: Payer: Self-pay | Admitting: Family Medicine

## 2016-06-10 NOTE — Telephone Encounter (Signed)
Spoke with family and they were not aware of any issue they said a few months ago she was having issues with her dentures but they bought a better adhesive and that was resolved.  Called [facility] and spoke to the nurse on call and she advise me that they think it's just her dementia progressing. They have started giving her small cut up sandwiches and she will eat that but won't eat a lot of food though out the day, they also think that her bad vision is making it harder for her to eat because she has a hard time seeing her plate so that's why they stick to sandwiches. Nurse said that they are giving her the Health Shakes (their protein powder) 2 x daily and they are requesting a new order for you to change it to 3 x daily because she seems to do good with that

## 2016-06-10 NOTE — Telephone Encounter (Signed)
Would you please call her family and ask about [name]'s nutrition status- [facility] sent me a message stating she is still loosing weight.   Is she eating /less/not eating at all ?   Thanks

## 2016-06-10 NOTE — Telephone Encounter (Signed)
Order faxed.

## 2016-06-10 NOTE — Telephone Encounter (Signed)
Thanks- I put that order in the IN box

## 2016-06-17 ENCOUNTER — Telehealth: Payer: Self-pay | Admitting: Internal Medicine

## 2016-06-17 NOTE — Telephone Encounter (Signed)
LMOMTCB x 1 

## 2016-06-17 NOTE — Telephone Encounter (Signed)
MR  Please Advise  Pt. Daughter called and wanted to see if her mother could be switched to a different inhaler. Please see the original call intake below.    Kittel,Paula (Emergency Contact)  to [facility]       06/17/16 9:34 AM  Paula(daughter n law) stated patient recieved an letter stating insurance will not cover budesonide (PULMICORT FLEXHALER) 180 MCG/ACT inhaler after the first of the year. They listed two drugs they will cover. Arnuity Ellitta and Flovent HFA.

## 2016-06-18 NOTE — Telephone Encounter (Signed)
LMOMTCB x 1 

## 2016-06-18 NOTE — Telephone Encounter (Signed)
Change to ARNUITY  Thanks  Dr. Kalman ShanMurali Meghann Landing, M.D., [facility] Hospital[facility]C.C.P Pulmonary and Critical Care Medicine Staff Physician [facility]monary and Critical Care Pager: [phone], If no answer or between  15:00h - 7:00h: call [phone]  06/18/2016 4:11 PM

## 2016-06-19 ENCOUNTER — Telehealth: Payer: Self-pay

## 2016-06-19 ENCOUNTER — Ambulatory Visit: Payer: Self-pay | Admitting: Family Medicine

## 2016-06-19 MED ORDER — OMEPRAZOLE 20 MG PO CPDR: 20.0000 mg | DELAYED_RELEASE_CAPSULE | Freq: Every day | ORAL | 3 refills | Status: AC

## 2016-06-19 NOTE — Telephone Encounter (Signed)
[name] (no DPR) left v/m; pt will not be covered by ins in 2018 for lansoprazole. In 2018 alternative drugs would be omeprazole or nexium granules; [name] request cb. [facility].

## 2016-06-19 NOTE — Telephone Encounter (Signed)
I don't really know what the nexium granules are and it is not apparent on the micromedex drug page Does that mean the material inside the capsule?   (you may have to ask a pharmacist)   If that is the case- please ask the family if they would prefer that to an omeprazole pill  I assume you could mix the granules with something like food or liquid?   Thanks

## 2016-06-19 NOTE — Telephone Encounter (Signed)
Gunnar Fusiaula said the Omeprazole is a tier 2 med and more affordable so she would prefer the omeprazole if you are okay with it. [facility] and once you send it in I will call them and d/c the lansoprazole and advise the to wait until 07/01/16 to fill the omeprazole

## 2016-06-19 NOTE — Telephone Encounter (Signed)
[facility] and advise them to place omeprazole on hold until the family called them to request Rx

## 2016-06-19 NOTE — Telephone Encounter (Signed)
Done, thanks

## 2016-06-19 NOTE — Telephone Encounter (Signed)
Spoke with the pt's daughter and notified of recs per MR  Before we send rx, I need to know which strength 100 or 200 Please advise, thanks!

## 2016-06-20 MED ORDER — FLUTICASONE FUROATE 100 MCG/ACT IN AEPB: 1.0000 | INHALATION_SPRAY | Freq: Every day | RESPIRATORY_TRACT | 6 refills | Status: AC

## 2016-06-20 NOTE — Telephone Encounter (Signed)
Spoke with [name] and she [facility], the new rx was sent in nothing further is needed at this time.

## 2016-06-20 NOTE — Telephone Encounter (Signed)
lmtcb x1 for pt's daughter, Gunnar Fusiaula. Need to verify pt's pharmacy.

## 2016-06-20 NOTE — Telephone Encounter (Signed)
[employer] is her ins. Would like a call back wanted to make a change in med.Lauren GriffinsStanley A Morgan

## 2016-06-20 NOTE — Telephone Encounter (Signed)
arnuity 100  Dr. Kalman ShanMurali Branston Halsted, M.D., [facility] HospitalF.C.C.P Pulmonary and Critical Care Medicine Staff Physician [facility] [place] Pulmonary and Critical Care Pager: [phone], If no answer or between  15:00h - 7:00h: call [phone]  06/20/2016 11:04 AM

## 2016-06-25 DIAGNOSIS — J449 Chronic obstructive pulmonary disease, unspecified: Secondary | ICD-10-CM | POA: Diagnosis not present

## 2016-06-26 ENCOUNTER — Telehealth: Payer: Self-pay

## 2016-06-26 NOTE — Telephone Encounter (Signed)
Order in IN box to fax thanks

## 2016-06-26 NOTE — Telephone Encounter (Signed)
[facility] left v/m requesting a hold order for omeprazole 20 mg until 07/01/16 faxed to [phone]. Family does not want to pay for med at this time until ins switches over.Please advise.

## 2016-06-27 ENCOUNTER — Telehealth: Payer: Self-pay

## 2016-06-27 NOTE — Telephone Encounter (Signed)
Order faxed.

## 2016-06-27 NOTE — Telephone Encounter (Signed)
[facility] left v/m requesting order for a cream for pts bottom due to redness. Fax # [phone].

## 2016-06-27 NOTE — Telephone Encounter (Signed)
Please ok that verbal order and ask what kind/ brand of barrier cream they use (unless that does not need to be specified)  Let them know I cannot fax a written order until I return tomorrow  Thanks Send this back to me please as a reminder

## 2016-06-27 NOTE — Telephone Encounter (Signed)
Herbert SetaHeather said that pt's bottom is red but the skin hasn't broken so they usually just put desitin cream on it but they would need an order for the desitin cream, Heather aware you will do the order tomorrow when you come back to the office tomorrow

## 2016-06-28 NOTE — Telephone Encounter (Signed)
order faxed

## 2016-06-28 NOTE — Telephone Encounter (Signed)
Order done in IN box 

## 2016-07-03 ENCOUNTER — Telehealth: Payer: Self-pay | Admitting: Internal Medicine

## 2016-07-03 NOTE — Telephone Encounter (Signed)
Spoke with Research scientist (physical sciences)Heather at [facility]. Advised her that we do not have the pt currently on Pulmicort. Also advised her that we have not seen the pt in almost 1 year. Nothing further was needed.

## 2016-07-04 ENCOUNTER — Encounter: Payer: Self-pay | Admitting: *Deleted

## 2016-07-04 ENCOUNTER — Telehealth: Payer: Self-pay | Admitting: Internal Medicine

## 2016-07-04 NOTE — Telephone Encounter (Signed)
Letter has been written and will be faxed to the given fax number. Nothing further was needed.

## 2016-07-04 NOTE — Telephone Encounter (Signed)
[facility] and spoke to [place]. Herbert SetaHeather states because pt is no longer taking pulmicort that they are requesting a letter from the MD discontinuing the medication. This letter will need to be faxed to [phone] at [facility].   Dr. Lelon Mastamamswamy please advise if ok to write letter. Thanks.

## 2016-07-04 NOTE — Telephone Encounter (Signed)
Ok to write letter to [employer] pulmicort  Dr. Kalman ShanMurali Zaidan Keeble, M.D., [facility] HospitalF.C.C.P Pulmonary and Critical Care Medicine Staff Physician [facility] [place] Pulmonary and Critical Care Pager: [phone], If no answer or between  15:00h - 7:00h: call [phone]  07/04/2016 12:12 PM

## 2016-07-05 ENCOUNTER — Encounter: Payer: Self-pay | Admitting: Family Medicine

## 2016-07-05 ENCOUNTER — Ambulatory Visit (INDEPENDENT_AMBULATORY_CARE_PROVIDER_SITE_OTHER): Payer: Medicare Other | Admitting: Family Medicine

## 2016-07-05 VITALS — BP 112/70 | HR 61 | Temp 97.8°F

## 2016-07-05 DIAGNOSIS — J449 Chronic obstructive pulmonary disease, unspecified: Secondary | ICD-10-CM | POA: Diagnosis not present

## 2016-07-05 DIAGNOSIS — F0280 Dementia in other diseases classified elsewhere without behavioral disturbance: Secondary | ICD-10-CM

## 2016-07-05 DIAGNOSIS — I5032 Chronic diastolic (congestive) heart failure: Secondary | ICD-10-CM

## 2016-07-05 DIAGNOSIS — E119 Type 2 diabetes mellitus without complications: Secondary | ICD-10-CM

## 2016-07-05 DIAGNOSIS — S22089S Unspecified fracture of T11-T12 vertebra, sequela: Secondary | ICD-10-CM

## 2016-07-05 DIAGNOSIS — R627 Adult failure to thrive: Secondary | ICD-10-CM

## 2016-07-05 DIAGNOSIS — G301 Alzheimer's disease with late onset: Secondary | ICD-10-CM

## 2016-07-05 DIAGNOSIS — K219 Gastro-esophageal reflux disease without esophagitis: Secondary | ICD-10-CM

## 2016-07-05 LAB — HEMOGLOBIN A1C: HEMOGLOBIN A1C: 6.7 % — AB (ref 4.6–6.5)

## 2016-07-05 NOTE — Patient Instructions (Addendum)
She should just be on omeprazole (without prevacid)  Keep cutting food small and assist with eating /feeding  Also the nutritional supplement shakes   A1C today   Follow up for annual exam in 6 months with labs prior

## 2016-07-05 NOTE — Progress Notes (Signed)
Subjective:    Patient ID: [name], female    DOB: 01/05/1930, 83 y.o.   MRN: [id number]  HPI Here for f/u of chronic health problems   Wt Readings from Last 3 Encounters:  12/19/15 125 lb 4 oz (56.8 kg)  12/19/15 125 lb 4 oz (56.8 kg)  08/21/15 139 lb (63 kg)   Unable to weigh today  Prev bmi was 22.9  She is having a harder time eating with dentures -now improved with better adhesive Also hard time seeing food/they cut up sandwiches Unsure with dementia if she could get through cataract surgery or recovery w/o rubbing eyes     Had CT spine in nov for T11 comp fx Also incidentally saw atherosclerosis of coronaries and aorta  Doing some protien health shakes in between meals  Not complaining of any pain at all  Did some therapy and hopes it helps   Sees pulmonary for copd Has been pretty stable - upcoming appt  D/c her pulmicort  On ellipta and spiriva   Changed prevacid to prevacid   BP Readings from Last 3 Encounters:  07/05/16 112/70  05/11/16 144/67  04/04/16 110/78   DM2 On metformin Lab Results  Component Value Date   HGBA1C 6.4 12/19/2015  not doing eye exam due to advanced age  Due for A1C  Cholesterol  On lipitor  Lab Results  Component Value Date   CHOL 143 12/19/2015   HDL 54.90 12/19/2015   LDLCALC 67 12/19/2015   LDLDIRECT 101.3 06/03/2014   TRIG 105.0 12/19/2015   CHOLHDL 3 12/19/2015     Dementia  remeron at night  aricept 10 mg  Not interested in inc to the 23 mg or adding therapy given the severity of her dementia   Patient Active Problem List   Diagnosis Date Noted  . Low back pain 08/21/2015  . Chronic obstructive pulmonary disease, unspecified copd, unspecified chronic bronchitis type 07/13/2015  . Encounter for Medicare annual wellness exam 12/12/2014  . Routine general medical examination at a health care facility 12/12/2014  . Abnormal urine odor 12/12/2014  . Syncope 08/26/2013  . Bradycardia 08/26/2013  . Hip  pain 06/30/2013  . Fall at nursing home 06/30/2013  . Near syncope 09/30/2012  . Headache(784.0) 09/30/2012  . Failure to thrive in adult 09/25/2012  . Depression 09/25/2012  . Abdominal pain, lower 09/25/2012  . Vitamin B12 deficiency 09/08/2012  . Hypertrophic toenail 07/21/2012  . OSA (obstructive sleep apnea) 07/15/2012  . Personal history of pulmonary embolism 06/17/2012  . Chest pain 06/16/2012  . Esophageal dysmotility suspected 06/15/2012  . GERD (gastroesophageal reflux disease) 06/15/2012  . Dementia 04/21/2012  . Chronic diastolic heart failure (HCC) 04/21/2012  . COPD (chronic obstructive pulmonary disease) (HCC) 04/21/2012  . Mixed incontinence urge and stress 01/04/2011  . Vitamin D deficiency 07/14/2010  . ANEMIA 01/24/2009  . Former smoker 11/04/2007  . MORTON'S NEUROMA 11/03/2007  . MACULAR DEGENERATION 11/03/2007  . Allergic rhinitis 11/03/2007  . Osteoporosis 11/03/2007  . Diabetes type 2, controlled (HCC) 11/03/2007  . HYPERCHOLESTEROLEMIA 11/25/2006   Past Medical History:  Diagnosis Date  . Allergy    allergic Rhintis  . AVM (arteriovenous malformation) of colon    a. 2006 colonscopy  . Carotid stenosis    a. 09/2007: Carotid Doppler stable- stable bilateral stenosis 40-59% (from 06)  . Chronic diastolic CHF (congestive heart failure) (HCC)   . Chronic respiratory failure (HCC)   . Complication of anesthesia   . COPD (  chronic obstructive pulmonary disease) (HCC)   . Dementia    Dementia in Hospital //Does O.K. at home.  . Diverticulosis   . GERD (gastroesophageal reflux disease)   . GI bleeding    a. when on coumadin in 2006 - colonoscopy @ that time showed 2 large right colon avm's, multiple small bowel avm's, fresh blood in prox small bowel.  . Hyperlipidemia   . Hypertension   . Normocytic anemia 03/2012  . Osteoporosis   . PAF (paroxysmal atrial fibrillation) (HCC)    a. PAF dates back > 5 yrs, prev on coumadin->d/c 2006 2/2 GIB;   . PONV  (postoperative nausea and vomiting)   . Pulmonary embolism (HCC)    SP IVC filter  . Tobacco abuse    a. ongoing - 50+ pack yr hx.  . Type II diabetes mellitus (HCC)    Past Surgical History:  Procedure Laterality Date  . CARDIAC CATHETERIZATION  1990's   clear  . COLONOSCOPY  08/2004  . ESOPHAGOGASTRODUODENOSCOPY  08/2004   Multiple   . ORIF TIBIA & FIBULA FRACTURES  05/2004  . SMALL BOWEL ENTEROSCOPY  03/2005   AVMS   Social History  Substance Use Topics  . Smoking status: Former Smoker    Packs/day: 0.50    Years: 50.00    Types: Cigarettes    Quit date: 04/21/2012  . Smokeless tobacco: Never Used  . Alcohol use No   Family History  Problem Relation Age of Onset  . Stroke Mother     a. believes she died suddenly @ 106.  . Diabetes Mother   . Stroke Father     Died from cerebral hemorrhage @ 33  . Diabetes Father   . Cancer Sister     colon and stomach cancer  . Cancer Brother     died from lung cancer  . Cancer Sister     ? liver//brain cancer  . Heart disease Sister   . Diabetes Sister     diabetes and glaucoma  . Cancer Brother     pancreatic cancer  . Diabetes Son   . Diabetes Brother   . Heart disease Brother   . Heart disease Brother     MI   Allergies  Allergen Reactions  . Azithromycin Other (See Comments)    Reaction:  Unknown   . Codeine Itching  . Coumadin [Warfarin] Other (See Comments)    Reaction:  GI bleeding   . Oxybutynin Chloride Er Other (See Comments)    Reaction:  Constipation, dizziness, dry mouth    Current Outpatient Prescriptions on File Prior to Visit  Medication Sig Dispense Refill  . acetaminophen (TYLENOL) 325 MG tablet Take 650 mg by mouth every 4 (four) hours as needed for mild pain, moderate pain or headache.     [name] aspirin 81 MG chewable tablet Chew 81 mg by mouth daily.    [name] atorvastatin (LIPITOR) 10 MG tablet Take 10 mg by mouth daily.     . cholecalciferol (VITAMIN D) 1000 units tablet Take 4,000 Units by mouth  daily.    [name] docusate (COLACE) 50 MG/5ML liquid Take 10 mLs (100 mg total) by mouth 2 (two) times daily as needed for mild constipation or moderate constipation. 200 mL 11  . donepezil (ARICEPT) 10 MG tablet Take 10 mg by mouth at bedtime.     . fluticasone (FLONASE) 50 MCG/ACT nasal spray Place 1 spray into both nostrils daily.     . Fluticasone Furoate (ARNUITY ELLIPTA) 100  MCG/ACT AEPB Inhale 1 puff into the lungs daily. 30 each 6  . HYDROcodone-acetaminophen (NORCO) 5-325 MG tablet Take 1-2 tablets by mouth every 6 (six) hours as needed. 20 tablet 0  . magnesium hydroxide (MILK OF MAGNESIA) 400 MG/5ML suspension Take 15 mLs by mouth daily as needed for mild constipation.     . metFORMIN (GLUCOPHAGE) 1000 MG tablet Take 1 tablet (1,000 mg total) by mouth 2 (two) times daily with a meal. 60 tablet 5  . mirtazapine (REMERON) 30 MG tablet Take 1 tablet (30 mg total) by mouth at bedtime. 90 tablet 3  . Nutritional Supplements (NUTRITIONAL DRINK PO) Take 1 Bottle by mouth 2 (two) times daily.    [name] omeprazole (PRILOSEC) 20 MG capsule Take 1 capsule (20 mg total) by mouth daily. 90 capsule 3  . polyethylene glycol (MIRALAX / GLYCOLAX) packet Take 17 g by mouth daily as needed for mild constipation or moderate constipation.     . Tiotropium Bromide Monohydrate (SPIRIVA RESPIMAT) 2.5 MCG/ACT AERS Inhale 2 puffs into the lungs daily. 4 g 11  . vitamin B-12 (CYANOCOBALAMIN) 1000 MCG tablet Take 4,000 mcg by mouth daily.      No current facility-administered medications on file prior to visit.      Review of Systems Review of Systems  Constitutional: Negative for fever, appetite change, fatigue and unexpected weight change. pos for wt loss from decreased po intake  Eyes: Negative for pain and visual disturbance.  Respiratory: Negative for cough and shortness of breath.   Cardiovascular: Negative for cp or palpitations    Gastrointestinal: Negative for nausea, diarrhea and constipation.  Genitourinary:  Negative for urgency and frequency.  MSK pos for T11 fx and neg for c/o of pain  , pos for hx of osteoporosis Skin: Negative for pallor or rash   Neurological: Negative for weakness, light-headedness, numbness and headaches.  Hematological: Negative for adenopathy. Does not bruise/bleed easily.  Psychiatric/Behavioral: Negative for dysphoric mood. The patient is occ nervous/agitated, pos for moderate to severe dementia        Objective:   Physical Exam  Constitutional: She appears well-developed and well-nourished. No distress.  Frail appearing elderly female in a wheelchair  Responds intermittently  Doses intermittently  HENT:  Head: Normocephalic and atraumatic.  Mouth/Throat: Oropharynx is clear and moist.  Eyes: Conjunctivae and EOM are normal. Pupils are equal, round, and reactive to light. Right eye exhibits no discharge. No scleral icterus.  Neck: Normal range of motion. Neck supple. Carotid bruit is not present. No thyromegaly present.  Cardiovascular: Normal rate, normal heart sounds and intact distal pulses.   No murmur heard. Pulmonary/Chest: Effort normal and breath sounds normal. No respiratory distress. She has no wheezes. She has no rales.  Diffusely distant bs No wheezing   Abdominal: Soft. Bowel sounds are normal. She exhibits no distension. There is no tenderness.  Exam done sitting   Musculoskeletal: She exhibits no edema.  Kyphosis noted No spinous process tenderness  Lymphadenopathy:    She has no cervical adenopathy.  Neurological: She has normal reflexes. No cranial nerve deficit. She exhibits normal muscle tone. Coordination normal.  Mental status is changeable - at times dozing and at times responsive  Tangential in speech -but no slurring   Skin: Skin is warm and dry. No rash noted. No erythema.  Psychiatric: Cognition and memory are impaired. She exhibits abnormal recent memory.  Moderate dementia   Not agitated          Assessment & Plan:  Problem List Items Addressed This Visit      Cardiovascular and Mediastinum   Chronic diastolic heart failure (HCC) - Primary    Doing well/no exacerbations or complaints  No diuretics needed         Respiratory   COPD (chronic obstructive pulmonary disease) (HCC)    Overall stable  Seeing pulmonary and now on ellipta and spiriva Has not needed rescue albuterol lately          Digestive   GERD (gastroesophageal reflux disease)    Changing from prevacid to omeprazole for ins reasons  No c/o         Endocrine   Diabetes type 2, controlled (HCC)    Due for A1C  Less po intake due to dementia lately  No hypoglycemia Lab today  Adj therapy as needed  Not a candidate for eye exam in light of dementia/mental status       Relevant Orders   Hemoglobin A1c (Completed)     Nervous and Auditory   Dementia    Worsening with time Family does not wish to advance aricept dose or add tx in light of severity of illness  Good care from family and living facility  Mirtazapine helps mood and sleep  Fall precautions emphasized  Now needs help with feeding /meals and dec po intake has caused wt loss         Musculoskeletal and Integument   Closed T11 fracture (HCC)    From a fall in her residence  She has not c/o of pain Gets assstance with ambulation Stressed importance of fall prev in light of dementia         Other   Failure to thrive in adult    Caused by dementia/ decreased po intake/ and vision issues She is getting assistance with feeding/meals and prepping food that she can chew  Will continue to follow Continue mirtazapine for appetite

## 2016-07-05 NOTE — Progress Notes (Signed)
Pre visit review using our clinic review tool, if applicable. No additional management support is needed unless otherwise documented below in the visit note. 

## 2016-07-06 DIAGNOSIS — S22089A Unspecified fracture of T11-T12 vertebra, initial encounter for closed fracture: Secondary | ICD-10-CM | POA: Insufficient documentation

## 2016-07-06 NOTE — Assessment & Plan Note (Signed)
Due for A1C  Less po intake due to dementia lately  No hypoglycemia Lab today  Adj therapy as needed  Not a candidate for eye exam in light of dementia/mental status

## 2016-07-06 NOTE — Assessment & Plan Note (Signed)
Changing from prevacid to omeprazole for ins reasons  No c/o

## 2016-07-06 NOTE — Assessment & Plan Note (Signed)
Worsening with time Family does not wish to advance aricept dose or add tx in light of severity of illness  Good care from family and living facility  Mirtazapine helps mood and sleep  Fall precautions emphasized  Now needs help with feeding /meals and dec po intake has caused wt loss

## 2016-07-06 NOTE — Assessment & Plan Note (Signed)
From a fall in her residence  She has not c/o of pain Gets assstance with ambulation Stressed importance of fall prev in light of dementia

## 2016-07-06 NOTE — Assessment & Plan Note (Signed)
Doing well/no exacerbations or complaints  No diuretics needed

## 2016-07-06 NOTE — Assessment & Plan Note (Signed)
Overall stable  Seeing pulmonary and now on ellipta and spiriva Has not needed rescue albuterol lately

## 2016-07-06 NOTE — Assessment & Plan Note (Signed)
Caused by dementia/ decreased po intake/ and vision issues She is getting assistance with feeding/meals and prepping food that she can chew  Will continue to follow Continue mirtazapine for appetite

## 2016-07-16 ENCOUNTER — Ambulatory Visit (INDEPENDENT_AMBULATORY_CARE_PROVIDER_SITE_OTHER): Payer: Medicare Other | Admitting: Internal Medicine

## 2016-07-16 ENCOUNTER — Encounter: Payer: Self-pay | Admitting: Internal Medicine

## 2016-07-16 VITALS — BP 120/82 | HR 96 | Ht 62.0 in

## 2016-07-16 DIAGNOSIS — J42 Unspecified chronic bronchitis: Secondary | ICD-10-CM

## 2016-07-16 MED ORDER — LEVALBUTEROL TARTRATE 45 MCG/ACT IN AERO: 1.0000 | INHALATION_SPRAY | RESPIRATORY_TRACT | 12 refills | Status: AC | PRN

## 2016-07-16 NOTE — Progress Notes (Signed)
Subjective:     Patient ID: [name], female   DOB: 1930/04/30, 83 y.o.   MRN: [id number]  HPI   #Acute Respiratory Failure #Chronic Respiratory Failure / COPD - June 2010 - intubated with acinetobacter (resistant) lower lobe pneumonia following ankle fracture  - Admited 04/21/12 - 05/05/12 - Rx Bipap. Discharged on nocturnal NIPPV. In hospital PCO2 71  #Tobacco Abuse  - quit Oct 2013 post admission  #. Acute on Chronic Diastolic CHF  - EF 55-60% by echo 04/22/12  #Cachexia - Weight 150-->143lbs   - weight 137# on 01/08/2013 with Body mass index is 22.25 kg/(m^2).    # Atrial Fibrillation w/ RVR  - H/o PAF. Not anticoagulation candidate due to h/o AVM/GIB, dementia, and fall risk  - Conservative therapy with rate control   #Other issues   -  Diabetes Mellitus Type 2, Anemia of chronic disease  #Lives in [facility] - Memory Care Unit - since summer 2014      OV 07/13/2014  Chief Complaint  Patient presents with  . Follow-up    Pt stated her breathing has improved since last OV. Pt Pt c/o DOE. Pt denies CP/tightness and significiant cough.     FU COPD NOS, Sleep apnea - unable to use cpap,demenita 0 livess in memory care unit   This is a 9 month follow-up for COPD not otherwise specified. She is now only on Spiriva once daily. Xopenex as needed. In the past 9 months she has had no admissions for COPD or any other medical problems. No new medical diagnoses. No emergency room visits or urgent care visits or hospitalizations. She most recently followed up with primary care physician in December 2015 and deemed stable. In terms of her dementia that is also stable. COPD stable. COPD cat score which is more accurate today is 11 and his reported baseline. Her son is with her along with? Daughter-in-law. They report being up-to-date with vaccines   OV 07/13/2015  Chief Complaint  Patient presents with  . Follow-up    Occ SOB denies any wheezing cp/ tightness.     83 year old female with COPD not otherwise specified. Dementia lives in a memory care unit. This a 1 year follow-up. Presents with her son and? Daughter-in-law. The last 1 and no admissions or hospitalizations or prednisone or combinations of COPD. No new medical diagnoses. The main issue is that the dementia has progressed. Mostly she does not recognize the sun. She has occasional periods of incontinence. She uses a walker and ambulates. COPD cat score is 19 and is filled with a son but I'm not sure if this is valid other than to give [place] a sense of her symptoms. They're up-to-date with vaccines. She is currently on Spiriva inhaler which they say that they're able to administer successfully. It is an MDI format. She is a Xopenex as needed. Apparently it is easier for the facility to give her the inhaler as opposed to  Nebulizers.   OV 07/16/2016  Chief Complaint  Patient presents with  . Follow-up    Pt states her breahting is unchanged since last OV - baseline. Pt denies cough and CP/tightness and f/c/s.     83 year old female with COPD not otherwise specified. She has dementia and lives in a memory care unit. This a one-year follow-up as always. Presents with a son who is a retired Engineer, civil (consulting) and? Daughter-in-law. In the last 1 year no COPD exacerbations and no new problems. She's had progressive worsening of memory.  She has occasional incontinence but no dysphagia. She did ambulate with a walker and physical therapy but since physical therapy stop she no longer ambulates. She is able to talk sentences. Overall they feel that she has decline both in memory and physical conditioning compared to one year ago. Because of insurance changes her inhaler has been changed from Spiriva to [employer]. She is up-to-date with her flu shot. Son does not think she's had a single sac seen.   CAT COPD Symptom & Quality of Life Score (GSK trademark) 0 is no burden. 5 is highest burden 07/13/2015 Filled by famil  Never  Cough -> Cough all the time 0  No phlegm in chest -> Chest is full of phlegm 0  No chest tightness -> Chest feels very tight 1  No dyspnea for 1 flight stairs/hill -> Very dyspneic for 1 flight of stairs 5  No limitations for ADL at home -> Very limited with ADL at home 5  Confident leaving home -> Not at all confident leaving home 5  Sleep soundly -> Do not sleep soundly because of lung condition 0  Lots of Energy -> No energy at all 3  TOTAL Score (max 40)  19      has a past medical history of Allergy; AVM (arteriovenous malformation) of colon; Carotid stenosis; Chronic diastolic CHF (congestive heart failure) (HCC); Chronic respiratory failure (HCC); Complication of anesthesia; COPD (chronic obstructive pulmonary disease) (HCC); Dementia; Diverticulosis; GERD (gastroesophageal reflux disease); GI bleeding; Hyperlipidemia; Hypertension; Normocytic anemia (03/2012); Osteoporosis; PAF (paroxysmal atrial fibrillation) (HCC); PONV (postoperative nausea and vomiting); Pulmonary embolism (HCC); Tobacco abuse; and Type II diabetes mellitus (HCC).   reports that she quit smoking about 4 years ago. Her smoking use included Cigarettes. She has a 25.00 pack-year smoking history. She has never used smokeless tobacco.  Past Surgical History:  Procedure Laterality Date  . CARDIAC CATHETERIZATION  1990's   clear  . COLONOSCOPY  08/2004  . ESOPHAGOGASTRODUODENOSCOPY  08/2004   Multiple   . ORIF TIBIA & FIBULA FRACTURES  05/2004  . SMALL BOWEL ENTEROSCOPY  03/2005   AVMS    Allergies  Allergen Reactions  . Azithromycin Other (See Comments)    Reaction:  Unknown   . Codeine Itching  . Coumadin [Warfarin] Other (See Comments)    Reaction:  GI bleeding   . Oxybutynin Chloride Er Other (See Comments)    Reaction:  Constipation, dizziness, dry mouth     Immunization History  Administered Date(s) Administered  . Influenza Split 04/22/2012, 07/13/2012, 03/31/2014  . Influenza Whole 05/08/2007,  03/28/2008, 03/28/2010  . Influenza-Unspecified 04/15/2015, 05/15/2016  . PPD Test 11/17/2012  . Pneumococcal Conjugate-13 09/16/2013  . Pneumococcal Polysaccharide-23 05/26/2006, 04/22/2012  . Td 09/13/2003  . Zoster 07/01/2006    Family History  Problem Relation Age of Onset  . Stroke Mother     a. believes she died suddenly @ 39.  . Diabetes Mother   . Stroke Father     Died from cerebral hemorrhage @ 77  . Diabetes Father   . Cancer Sister     colon and stomach cancer  . Cancer Brother     died from lung cancer  . Cancer Sister     ? liver//brain cancer  . Heart disease Sister   . Diabetes Sister     diabetes and glaucoma  . Cancer Brother     pancreatic cancer  . Diabetes Son   . Diabetes Brother   . Heart disease Brother   .  Heart disease Brother     MI     Current Outpatient Prescriptions:  .  acetaminophen (TYLENOL) 325 MG tablet, Take 650 mg by mouth every 4 (four) hours as needed for mild pain, moderate pain or headache. , Disp: , Rfl:  .  aspirin 81 MG chewable tablet, Chew 81 mg by mouth daily., Disp: , Rfl:  .  atorvastatin (LIPITOR) 10 MG tablet, Take 10 mg by mouth daily. , Disp: , Rfl:  .  cholecalciferol (VITAMIN D) 1000 units tablet, Take 4,000 Units by mouth daily., Disp: , Rfl:  .  docusate (COLACE) 50 MG/5ML liquid, Take 10 mLs (100 mg total) by mouth 2 (two) times daily as needed for mild constipation or moderate constipation., Disp: 200 mL, Rfl: 11 .  donepezil (ARICEPT) 10 MG tablet, Take 10 mg by mouth at bedtime. , Disp: , Rfl:  .  fluticasone (FLONASE) 50 MCG/ACT nasal spray, Place 1 spray into both nostrils daily. , Disp: , Rfl:  .  Fluticasone Furoate (ARNUITY ELLIPTA) 100 MCG/ACT AEPB, Inhale 1 puff into the lungs daily., Disp: 30 each, Rfl: 6 .  HYDROcodone-acetaminophen (NORCO) 5-325 MG tablet, Take 1-2 tablets by mouth every 6 (six) hours as needed., Disp: 20 tablet, Rfl: 0 .  magnesium hydroxide (MILK OF MAGNESIA) 400 MG/5ML suspension,  Take 15 mLs by mouth daily as needed for mild constipation. , Disp: , Rfl:  .  metFORMIN (GLUCOPHAGE) 1000 MG tablet, Take 1 tablet (1,000 mg total) by mouth 2 (two) times daily with a meal., Disp: 60 tablet, Rfl: 5 .  mirtazapine (REMERON) 30 MG tablet, Take 1 tablet (30 mg total) by mouth at bedtime., Disp: 90 tablet, Rfl: 3 .  Nutritional Supplements (NUTRITIONAL DRINK PO), Take 1 Bottle by mouth 2 (two) times daily., Disp: , Rfl:  .  omeprazole (PRILOSEC) 20 MG capsule, Take 1 capsule (20 mg total) by mouth daily., Disp: 90 capsule, Rfl: 3 .  polyethylene glycol (MIRALAX / GLYCOLAX) packet, Take 17 g by mouth daily as needed for mild constipation or moderate constipation. , Disp: , Rfl:  .  Tiotropium Bromide Monohydrate (SPIRIVA RESPIMAT) 2.5 MCG/ACT AERS, Inhale 2 puffs into the lungs daily., Disp: 4 g, Rfl: 11 .  vitamin B-12 (CYANOCOBALAMIN) 1000 MCG tablet, Take 4,000 mcg by mouth daily. , Disp: , Rfl:  .  levalbuterol (XOPENEX HFA) 45 MCG/ACT inhaler, Inhale 1-2 puffs into the lungs every 4 (four) hours as needed for wheezing., Disp: 1 Inhaler, Rfl: 12   Review of Systems     Objective:   Physical Exam  Constitutional: She is oriented to person, place, and time. She appears well-nourished. No distress.  Frail female sitting in a wheelchair  HENT:  Head: Normocephalic and atraumatic.  Right Ear: External ear normal.  Left Ear: External ear normal.  Mouth/Throat: Oropharynx is clear and moist. No oropharyngeal exudate.  Head drooped to the left side  Eyes: Conjunctivae and EOM are normal. Pupils are equal, round, and reactive to light. Right eye exhibits no discharge. Left eye exhibits no discharge. No scleral icterus.  Neck: Normal range of motion. Neck supple. No JVD present. No tracheal deviation present. No thyromegaly present.  Cardiovascular: Normal rate, regular rhythm, normal heart sounds and intact distal pulses.  Exam reveals no gallop and no friction rub.   No murmur  heard. Pulmonary/Chest: Effort normal and breath sounds normal. No respiratory distress. She has no wheezes. She has no rales. She exhibits no tenderness.  Abdominal: Soft. Bowel sounds are  normal. She exhibits no distension and no mass. There is no tenderness. There is no rebound and no guarding.  Musculoskeletal: Normal range of motion. She exhibits no edema or tenderness.  Chronic osteoporosis findings  Lymphadenopathy:    She has no cervical adenopathy.  Neurological: She is alert and oriented to person, place, and time. She has normal reflexes. No cranial nerve deficit. She exhibits normal muscle tone. Coordination normal.  Skin: Skin is warm and dry. No rash noted. She is not diaphoretic. No erythema. No pallor.  Psychiatric:  Alert and interacts occasionally as I talk to her son  Vitals reviewed.  Vitals:   07/16/16 1230  BP: 120/82  Pulse: 96  SpO2: 97%  Height: 5\' 2"  (1.575 m)    Estimated body mass index is 22.91 kg/m as calculated from the following:   Height as of 08/21/15: 5\' 2"  (1.575 m).   Weight as of 12/19/15: 125 lb 4 oz (56.8 kg).       Assessment:       ICD-9-CM ICD-10-CM   1. Chronic bronchitis, unspecified chronic bronchitis type (HCC) 491.9 J42        Plan:      #COPD Continue arnuity  per schedule  use xopenex mdi as neeeded Glad uptodate with respiratory vaccines Please talk to PCP Roxy MannsMarne Tower, MD about shingles vaccines  #Followup 12 months REturn sooner if needed   Dr. Kalman ShanMurali Josephanthony Tindel, M.D., [facility] District[facility].C.C.P Pulmonary and Critical Care Medicine Staff Physician [facility] Pager: [phone], If no answer or between  15:00h - 7:00h: call [phone]  07/16/2016 12:53 PM

## 2016-07-16 NOTE — Patient Instructions (Addendum)
ICD-9-CM ICD-10-CM   1. Chronic obstructive pulmonary disease, unspecified COPD, unspecified chronic bronchitis type 496 J44.9    #COPD Continue arnuity  per schedule  use xopenex mdi as neeeded Glad uptodate with respiratory vaccines Please talk to PCP Roxy MannsMarne Tower, MD about shingles vaccines  #Followup 12 months REturn sooner if needed

## 2016-07-26 DIAGNOSIS — J449 Chronic obstructive pulmonary disease, unspecified: Secondary | ICD-10-CM | POA: Diagnosis not present

## 2016-08-26 DIAGNOSIS — J449 Chronic obstructive pulmonary disease, unspecified: Secondary | ICD-10-CM | POA: Diagnosis not present

## 2016-09-05 ENCOUNTER — Telehealth: Payer: Self-pay

## 2016-09-05 DIAGNOSIS — J449 Chronic obstructive pulmonary disease, unspecified: Secondary | ICD-10-CM | POA: Diagnosis not present

## 2016-09-05 DIAGNOSIS — Z7984 Long term (current) use of oral hypoglycemic drugs: Secondary | ICD-10-CM | POA: Diagnosis not present

## 2016-09-05 DIAGNOSIS — I4891 Unspecified atrial fibrillation: Secondary | ICD-10-CM | POA: Diagnosis not present

## 2016-09-05 DIAGNOSIS — L89312 Pressure ulcer of right buttock, stage 2: Secondary | ICD-10-CM | POA: Diagnosis not present

## 2016-09-05 DIAGNOSIS — I10 Essential (primary) hypertension: Secondary | ICD-10-CM | POA: Diagnosis not present

## 2016-09-05 DIAGNOSIS — R159 Full incontinence of feces: Secondary | ICD-10-CM

## 2016-09-05 DIAGNOSIS — F0390 Unspecified dementia without behavioral disturbance: Secondary | ICD-10-CM

## 2016-09-05 DIAGNOSIS — F329 Major depressive disorder, single episode, unspecified: Secondary | ICD-10-CM | POA: Diagnosis not present

## 2016-09-05 DIAGNOSIS — R32 Unspecified urinary incontinence: Secondary | ICD-10-CM | POA: Diagnosis not present

## 2016-09-05 DIAGNOSIS — E119 Type 2 diabetes mellitus without complications: Secondary | ICD-10-CM | POA: Diagnosis not present

## 2016-09-05 DIAGNOSIS — Z7982 Long term (current) use of aspirin: Secondary | ICD-10-CM | POA: Diagnosis not present

## 2016-09-05 NOTE — Telephone Encounter (Signed)
I will sign paper for all her orders (there was another set today) -when I get in in the am Thanks

## 2016-09-05 NOTE — Telephone Encounter (Signed)
Heather at [facility] left v/m requesting order for a high rise on a w/c to make the back of the w/c taller. Heather request cb.

## 2016-09-05 NOTE — Telephone Encounter (Signed)
That is fine - please ok that verbal order

## 2016-09-05 NOTE — Telephone Encounter (Signed)
Lottie with [facility] left /vm; pt presently has a decubitus on rt buttock 3 cm x 0.5 cm x .1; area is small and linear.Duoderm was put on this area. Pt also has scab between great toe and 2nd toe; has  gauze wrapped between toes due to excessively long toe nails; staff is to schedule appt with podiatrist on 09/09/16. Lottie is [facility] for [facility] week and next week 2 x a week for 3 weeks for wound care. Lottie also request a gel cushion ordered thru [facility].pts present cushion is 2 " thick and hard as a rock. Pt is to be turned from side to side to hopefully prevent decubitus. Lottie request cb to office but can leave orders to anyone in office.

## 2016-09-05 NOTE — Telephone Encounter (Signed)
Spoke to [employer]ather and they can't take a verbal order she needs Dr. Milinda Antisower to complete an order form, she will fax it over to my direct fax, she is aware that Dr. Milinda Antisower is out of the office today and I advise her we will fax it back once Dr. Milinda Antisower is back in the office, form in Dr. Royden Purlower's inbox

## 2016-09-05 NOTE — Telephone Encounter (Signed)
Please ok all those verbal orders

## 2016-09-06 NOTE — Telephone Encounter (Signed)
Lottie left v/m requesting cb about plan of care orders.

## 2016-09-06 NOTE — Telephone Encounter (Signed)
Form faxed back to [facility]

## 2016-09-06 NOTE — Telephone Encounter (Signed)
Verbal order given to the on-call nurse and form faxed back giving order for wheelchair issues/gel pad

## 2016-09-07 ENCOUNTER — Emergency Department (HOSPITAL_COMMUNITY)
Admission: EM | Admit: 2016-09-07 | Discharge: 2016-09-08 | Disposition: A | Discharge status: Home / Self Care | Payer: Medicare Other | Attending: Emergency Medicine | Admitting: Emergency Medicine

## 2016-09-07 ENCOUNTER — Encounter (HOSPITAL_COMMUNITY): Payer: Self-pay | Admitting: Emergency Medicine

## 2016-09-07 DIAGNOSIS — J449 Chronic obstructive pulmonary disease, unspecified: Secondary | ICD-10-CM | POA: Insufficient documentation

## 2016-09-07 DIAGNOSIS — E119 Type 2 diabetes mellitus without complications: Secondary | ICD-10-CM | POA: Diagnosis not present

## 2016-09-07 DIAGNOSIS — Y939 Activity, unspecified: Secondary | ICD-10-CM | POA: Insufficient documentation

## 2016-09-07 DIAGNOSIS — S40012A Contusion of left shoulder, initial encounter: Secondary | ICD-10-CM | POA: Insufficient documentation

## 2016-09-07 DIAGNOSIS — W06XXXA Fall from bed, initial encounter: Secondary | ICD-10-CM | POA: Diagnosis not present

## 2016-09-07 DIAGNOSIS — S0003XA Contusion of scalp, initial encounter: Secondary | ICD-10-CM | POA: Diagnosis not present

## 2016-09-07 DIAGNOSIS — Y929 Unspecified place or not applicable: Secondary | ICD-10-CM | POA: Insufficient documentation

## 2016-09-07 DIAGNOSIS — I5032 Chronic diastolic (congestive) heart failure: Secondary | ICD-10-CM | POA: Diagnosis not present

## 2016-09-07 DIAGNOSIS — W19XXXA Unspecified fall, initial encounter: Secondary | ICD-10-CM

## 2016-09-07 DIAGNOSIS — S0101XA Laceration without foreign body of scalp, initial encounter: Secondary | ICD-10-CM

## 2016-09-07 DIAGNOSIS — Z7984 Long term (current) use of oral hypoglycemic drugs: Secondary | ICD-10-CM | POA: Diagnosis not present

## 2016-09-07 DIAGNOSIS — Z87891 Personal history of nicotine dependence: Secondary | ICD-10-CM | POA: Diagnosis not present

## 2016-09-07 DIAGNOSIS — Z7982 Long term (current) use of aspirin: Secondary | ICD-10-CM | POA: Insufficient documentation

## 2016-09-07 DIAGNOSIS — I110 Hypertensive heart disease with heart failure: Secondary | ICD-10-CM | POA: Diagnosis not present

## 2016-09-07 DIAGNOSIS — Y999 Unspecified external cause status: Secondary | ICD-10-CM | POA: Insufficient documentation

## 2016-09-07 DIAGNOSIS — S0990XA Unspecified injury of head, initial encounter: Secondary | ICD-10-CM | POA: Diagnosis present

## 2016-09-07 DIAGNOSIS — S199XXA Unspecified injury of neck, initial encounter: Secondary | ICD-10-CM | POA: Diagnosis not present

## 2016-09-07 DIAGNOSIS — S098XXA Other specified injuries of head, initial encounter: Secondary | ICD-10-CM | POA: Diagnosis not present

## 2016-09-07 DIAGNOSIS — S0993XA Unspecified injury of face, initial encounter: Secondary | ICD-10-CM | POA: Diagnosis not present

## 2016-09-07 NOTE — ED Triage Notes (Addendum)
Brought by ems from [facility] after patient had an unwitnessed fall.  Bleeding noted to left side of the head with swelling noted to left eye.  Per facility patient is at her baseline mentally.

## 2016-09-08 ENCOUNTER — Emergency Department (HOSPITAL_COMMUNITY): Payer: Medicare Other

## 2016-09-08 DIAGNOSIS — S199XXA Unspecified injury of neck, initial encounter: Secondary | ICD-10-CM | POA: Diagnosis not present

## 2016-09-08 DIAGNOSIS — S0003XA Contusion of scalp, initial encounter: Secondary | ICD-10-CM | POA: Diagnosis not present

## 2016-09-08 DIAGNOSIS — I621 Nontraumatic extradural hemorrhage: Secondary | ICD-10-CM | POA: Diagnosis not present

## 2016-09-08 DIAGNOSIS — S0191XA Laceration without foreign body of unspecified part of head, initial encounter: Secondary | ICD-10-CM | POA: Diagnosis not present

## 2016-09-08 MED ORDER — TETANUS-DIPHTH-ACELL PERTUSSIS 5-2.5-18.5 LF-MCG/0.5 IM SUSP
0.5000 mL | Freq: Once | INTRAMUSCULAR | Status: AC
  Administered 2016-09-08: 0.5 mL via INTRAMUSCULAR
  Filled 2016-09-08: qty 0.5

## 2016-09-08 NOTE — Discharge Instructions (Signed)

## 2016-09-08 NOTE — ED Provider Notes (Signed)
[facility]-EMERGENCY DEPT Provider Note   CSN: [id number] Arrival date & time: 09/07/16  2324     History   Chief Complaint Chief Complaint  Patient presents with  . Fall    HPI [name] is a 83 y.o. female with a hx of anemia, dementia, osteoporosis, COPD, presents to the Emergency Department via EMS After unwitnessed fall from bed. Per nursing home, patient was found on the ground beside her bed. Question where she hit her head. Nursing reports that patient is at baseline mental status.  Record review shows no evidence of anticoagulant usage.    LEVEL 5 CAVEAT for dementia  The history is provided by the EMS personnel, the nursing home and medical records. No language interpreter was used.    Past Medical History:  Diagnosis Date  . Allergy    allergic Rhintis  . AVM (arteriovenous malformation) of colon    a. 2006 colonscopy  . Carotid stenosis    a. 09/2007: Carotid Doppler stable- stable bilateral stenosis 40-59% (from 06)  . Chronic diastolic CHF (congestive heart failure) (HCC)   . Chronic respiratory failure (HCC)   . Complication of anesthesia   . COPD (chronic obstructive pulmonary disease) (HCC)   . Dementia    Dementia in Hospital //Does O.K. at home.  . Diverticulosis   . GERD (gastroesophageal reflux disease)   . GI bleeding    a. when on coumadin in 2006 - colonoscopy @ that time showed 2 large right colon avm's, multiple small bowel avm's, fresh blood in prox small bowel.  . Hyperlipidemia   . Hypertension   . Normocytic anemia 03/2012  . Osteoporosis   . PAF (paroxysmal atrial fibrillation) (HCC)    a. PAF dates back > 5 yrs, prev on coumadin->d/c 2006 2/2 GIB;   . PONV (postoperative nausea and vomiting)   . Pulmonary embolism (HCC)    SP IVC filter  . Tobacco abuse    a. ongoing - 50+ pack yr hx.  . Type II diabetes mellitus [facility])     Patient Active Problem List   Diagnosis Date Noted  . Closed T11 fracture (HCC) 07/06/2016  . Chronic  obstructive pulmonary disease, unspecified copd, unspecified chronic bronchitis type 07/13/2015  . Encounter for Medicare annual wellness exam 12/12/2014  . Routine general medical examination at a health care facility 12/12/2014  . Syncope 08/26/2013  . Bradycardia 08/26/2013  . Hip pain 06/30/2013  . Near syncope 09/30/2012  . Headache(784.0) 09/30/2012  . Failure to thrive in adult 09/25/2012  . Depression 09/25/2012  . Vitamin B12 deficiency 09/08/2012  . Hypertrophic toenail 07/21/2012  . OSA (obstructive sleep apnea) 07/15/2012  . Personal history of pulmonary embolism 06/17/2012  . Chest pain 06/16/2012  . Esophageal dysmotility suspected 06/15/2012  . GERD (gastroesophageal reflux disease) 06/15/2012  . Dementia 04/21/2012  . Chronic diastolic heart failure (HCC) 04/21/2012  . COPD (chronic obstructive pulmonary disease) (HCC) 04/21/2012  . Mixed incontinence urge and stress 01/04/2011  . Vitamin D deficiency 07/14/2010  . ANEMIA 01/24/2009  . Former smoker 11/04/2007  . MORTON'S NEUROMA 11/03/2007  . MACULAR DEGENERATION 11/03/2007  . Allergic rhinitis 11/03/2007  . Osteoporosis 11/03/2007  . Diabetes type 2, controlled (HCC) 11/03/2007  . HYPERCHOLESTEROLEMIA 11/25/2006    Past Surgical History:  Procedure Laterality Date  . CARDIAC CATHETERIZATION  1990's   clear  . COLONOSCOPY  08/2004  . ESOPHAGOGASTRODUODENOSCOPY  08/2004   Multiple   . ORIF TIBIA & FIBULA FRACTURES  05/2004  .  SMALL BOWEL ENTEROSCOPY  03/2005   AVMS    OB History    No data available       Home Medications    Prior to Admission medications   Medication Sig Start Date End Date Taking? Authorizing Provider  acetaminophen (TYLENOL) 325 MG tablet Take 650 mg by mouth every 4 (four) hours as needed for mild pain, moderate pain or headache.     Historical Provider, MD  aspirin 81 MG chewable tablet Chew 81 mg by mouth daily.    Historical Provider, MD  atorvastatin (LIPITOR) 10 MG tablet  Take 10 mg by mouth daily.     Historical Provider, MD  cholecalciferol (VITAMIN D) 1000 units tablet Take 4,000 Units by mouth daily.    Historical Provider, MD  docusate (COLACE) 50 MG/5ML liquid Take 10 mLs (100 mg total) by mouth 2 (two) times daily as needed for mild constipation or moderate constipation. 05/22/16   Judy PimpleMarne A Tower, MD  donepezil (ARICEPT) 10 MG tablet Take 10 mg by mouth at bedtime.     Judy PimpleMarne A Tower, MD  fluticasone (FLONASE) 50 MCG/ACT nasal spray Place 1 spray into both nostrils daily.     Historical Provider, MD  Fluticasone Furoate (ARNUITY ELLIPTA) 100 MCG/ACT AEPB Inhale 1 puff into the lungs daily. 06/20/16   Kalman ShanMurali Ramaswamy, MD  HYDROcodone-acetaminophen (NORCO) 5-325 MG tablet Take 1-2 tablets by mouth every 6 (six) hours as needed. 05/11/16   Geoffery Lyonsouglas Delo, MD  levalbuterol [facility](XOPENEX HFA) 45 MCG/ACT inhaler Inhale 1-2 puffs into the lungs every 4 (four) hours as needed for wheezing. 07/16/16   Kalman ShanMurali Ramaswamy, MD  magnesium hydroxide (MILK OF MAGNESIA) 400 MG/5ML suspension Take 15 mLs by mouth daily as needed for mild constipation.     Historical Provider, MD  metFORMIN (GLUCOPHAGE) 1000 MG tablet Take 1 tablet (1,000 mg total) by mouth 2 (two) times daily with a meal. 06/09/14   Judy PimpleMarne A Tower, MD  mirtazapine (REMERON) 30 MG tablet Take 1 tablet (30 mg total) by mouth at bedtime. 12/19/15   Judy PimpleMarne A Tower, MD  Nutritional Supplements (NUTRITIONAL DRINK PO) Take 1 Bottle by mouth 2 (two) times daily.    Historical Provider, MD  omeprazole (PRILOSEC) 20 MG capsule Take 1 capsule (20 mg total) by mouth daily. 06/19/16   Judy PimpleMarne A Tower, MD  polyethylene glycol (MIRALAX / GLYCOLAX) packet Take 17 g by mouth daily as needed for mild constipation or moderate constipation.     Historical Provider, MD  vitamin B-12 (CYANOCOBALAMIN) 1000 MCG tablet Take 4,000 mcg by mouth daily.     Historical Provider, MD    Family History Family History  Problem Relation Age of Onset  .  Stroke Mother     a. believes she died suddenly @ 6258.  . Diabetes Mother   . Stroke Father     Died from cerebral hemorrhage @ 8058  . Diabetes Father   . Cancer Sister     colon and stomach cancer  . Cancer Brother     died from lung cancer  . Cancer Sister     ? liver//brain cancer  . Heart disease Sister   . Diabetes Sister     diabetes and glaucoma  . Cancer Brother     pancreatic cancer  . Diabetes Son   . Diabetes Brother   . Heart disease Brother   . Heart disease Brother     MI    Social History Social History  Substance Use Topics  .  Smoking status: Former Smoker    Packs/day: 0.50    Years: 50.00    Types: Cigarettes    Quit date: 04/21/2012  . Smokeless tobacco: Never Used  . Alcohol use No     Allergies   Azithromycin; Codeine; Coumadin [warfarin]; and Oxybutynin chloride er   Review of Systems Review of Systems  Unable to perform ROS: Dementia     Physical Exam Updated Vital Signs BP 107/69   Pulse 96   Temp 98.7 F (37.1 C) (Oral)   Ht 5\' 3"  (1.6 m)   Wt 56.7 kg   SpO2 95%   BMI 22.14 kg/m   Physical Exam  Constitutional: She appears well-developed and well-nourished. No distress.  HENT:  Head: Normocephalic. Head is with abrasion.  Nose: Nose normal.  Mouth/Throat: Mucous membranes are normal. No trismus in the jaw.  2.5cm laceration to the crown Abrasion over the left eye; ecchymosis of the upper lid  Eyes: Conjunctivae and EOM are normal. Pupils are equal, round, and reactive to light. Right conjunctiva has no hemorrhage. Left conjunctiva has no hemorrhage. No scleral icterus.  Neck: Normal range of motion. No spinous process tenderness and no muscular tenderness present.  Cardiovascular: Normal rate, regular rhythm and intact distal pulses.   Pulmonary/Chest: Effort normal and breath sounds normal.  Abdominal: Soft. Bowel sounds are normal.  Musculoskeletal:  Contractures of the bilateral legs No TTP along the midline or  paraspinal muscles of the T spine and L spine Small bruising noted to the left shoulder but full range of motion and no tenderness to palpation  Neurological: She is alert.  Skin: Skin is warm and dry.     ED Treatments / Results   Radiology Ct Head Wo Contrast  Result Date: 09/08/2016 CLINICAL DATA:  Bleeding on left side of head with eye swelling after fall EXAM: CT HEAD WITHOUT CONTRAST CT CERVICAL SPINE WITHOUT CONTRAST TECHNIQUE: Multidetector CT imaging of the head and cervical spine was performed following the standard protocol without intravenous contrast. Multiplanar CT image reconstructions of the cervical spine were also generated. COMPARISON:  10/14/2015 CT head and CT cervical spine FINDINGS: CT HEAD FINDINGS BRAIN: There is mild sulcal and moderate ventricular prominence consistent with superficial and central atrophy. No intraparenchymal hemorrhage, mass effect nor midline shift. Periventricular and subcortical white matter hypodensities consistent with motor chronic small vessel ischemic disease are identified. No acute large vascular territory infarcts. No abnormal extra-axial fluid collections. Basal cisterns are not effaced and midline. VASCULAR: Moderate calcific atherosclerosis of the carotid siphons. SKULL: No skull fracture. Large left high posterior parietal scalp hematoma without underlying fracture. SINUSES/ORBITS: The mastoid air-cells are clear. Ethmoid and left maxillary sinus chronic mucosal thickening.The included ocular globes and orbital contents are non-suspicious. OTHER: None. CT CERVICAL SPINE FINDINGS Alignment: Normal cervical lordosis with mild dextroconvex curvature of the cervical spine possibly positional. Skull base and vertebrae: Negative for acute fracture. No posttraumatic subluxations. Craniocervical relationship and atlantodental interval are maintained. No jumped appearing facets. Soft tissues and spinal canal: No prevertebral fluid or swelling. No visible  canal hematoma. Disc levels: Chronic degenerative disc disease with small posterior marginal osteophytes and bilateral uncovertebral osteoarthritic spurring at C4-5 and C5-6. This contributes to bilateral mild neural foraminal bony encroachment. Upper chest: Apical pleuroparenchymal scarring bilaterally with mild centrilobular emphysema. Other: Moderate extracranial carotid arteriosclerosis. IMPRESSION: 1. High left posterior parietal scalp hematoma without underlying skull fracture. 2. Chronic cerebral atrophy with moderate to marked degree of chronic small vessel ischemia of periventricular  subcortical white matter. No acute intracranial abnormality. 3. Cervical spondylosis with degenerative disease most marked at C4-5 and C5-6. No acute posttraumatic cervical spine fracture or subluxation. Electronically Signed   By: Tollie Eth M.D.   On: 09/08/2016 01:50   Ct Cervical Spine Wo Contrast  Result Date: 09/08/2016 CLINICAL DATA:  Bleeding on left side of head with eye swelling after fall EXAM: CT HEAD WITHOUT CONTRAST CT CERVICAL SPINE WITHOUT CONTRAST TECHNIQUE: Multidetector CT imaging of the head and cervical spine was performed following the standard protocol without intravenous contrast. Multiplanar CT image reconstructions of the cervical spine were also generated. COMPARISON:  10/14/2015 CT head and CT cervical spine FINDINGS: CT HEAD FINDINGS BRAIN: There is mild sulcal and moderate ventricular prominence consistent with superficial and central atrophy. No intraparenchymal hemorrhage, mass effect nor midline shift. Periventricular and subcortical white matter hypodensities consistent with motor chronic small vessel ischemic disease are identified. No acute large vascular territory infarcts. No abnormal extra-axial fluid collections. Basal cisterns are not effaced and midline. VASCULAR: Moderate calcific atherosclerosis of the carotid siphons. SKULL: No skull fracture. Large left high posterior parietal  scalp hematoma without underlying fracture. SINUSES/ORBITS: The mastoid air-cells are clear. Ethmoid and left maxillary sinus chronic mucosal thickening.The included ocular globes and orbital contents are non-suspicious. OTHER: None. CT CERVICAL SPINE FINDINGS Alignment: Normal cervical lordosis with mild dextroconvex curvature of the cervical spine possibly positional. Skull base and vertebrae: Negative for acute fracture. No posttraumatic subluxations. Craniocervical relationship and atlantodental interval are maintained. No jumped appearing facets. Soft tissues and spinal canal: No prevertebral fluid or swelling. No visible canal hematoma. Disc levels: Chronic degenerative disc disease with small posterior marginal osteophytes and bilateral uncovertebral osteoarthritic spurring at C4-5 and C5-6. This contributes to bilateral mild neural foraminal bony encroachment. Upper chest: Apical pleuroparenchymal scarring bilaterally with mild centrilobular emphysema. Other: Moderate extracranial carotid arteriosclerosis. IMPRESSION: 1. High left posterior parietal scalp hematoma without underlying skull fracture. 2. Chronic cerebral atrophy with moderate to marked degree of chronic small vessel ischemia of periventricular subcortical white matter. No acute intracranial abnormality. 3. Cervical spondylosis with degenerative disease most marked at C4-5 and C5-6. No acute posttraumatic cervical spine fracture or subluxation. Electronically Signed   By: Tollie Eth M.D.   On: 09/08/2016 01:50    Procedures .Marland KitchenLaceration Repair Date/Time: 09/08/2016 1:00 AM Performed by: Dierdre Forth Authorized by: Dierdre Forth   Anesthesia (see MAR for exact dosages):    Anesthesia method:  None Laceration details:    Location:  Scalp   Scalp location:  Crown   Length (cm):  2.5 Repair type:    Repair type:  Simple Pre-procedure details:    Preparation:  Patient was prepped and draped in usual sterile  fashion Exploration:    Hemostasis achieved with:  Direct pressure   Wound exploration: entire depth of wound probed and visualized   Treatment:    Area cleansed with:  Saline   Amount of cleaning:  Standard   Irrigation solution:  Sterile water   Irrigation volume:  250   Irrigation method:  Syringe Skin repair:    Repair method:  Staples   Number of staples:  2 Approximation:    Approximation:  Close   Vermilion border: well-aligned   Post-procedure details:    Dressing:  Open (no dressing)   Patient tolerance of procedure:  Tolerated well, no immediate complications   (including critical care time)  Medications Ordered in ED Medications  Tdap (BOOSTRIX) injection 0.5 mL (0.5 mLs Intramuscular  Given 09/08/16 0330)     Initial Impression / Assessment and Plan / ED Course  I have reviewed the triage vital signs and the nursing notes.  Pertinent labs & imaging results that were available during my care of the patient were reviewed by me and considered in my medical decision making (see chart for details).     Patient with presumed mechanical fall out of bed. Scalp laceration stapled. Teeth Updated. Per facility, patient is at baseline. Highly doubt other etiology. Patient is not anticoagulated based on record review.  Patient is afebrile with normal vital signs. She will be discharged back to her facility.  The patient was discussed with and seen by Dr. Wilkie Aye who agrees with the treatment plan.   Final Clinical Impressions(s) / ED Diagnoses   Final diagnoses:  Fall, initial encounter  Laceration of scalp, initial encounter    New Prescriptions New Prescriptions   No medications on file     Dierdre Forth, PA-C 09/08/16 1610    Shon Baton, MD 09/08/16 [phone]

## 2016-09-08 NOTE — ED Notes (Signed)
Left with [facility] at this time. 

## 2016-09-10 DIAGNOSIS — Z7984 Long term (current) use of oral hypoglycemic drugs: Secondary | ICD-10-CM | POA: Diagnosis not present

## 2016-09-10 DIAGNOSIS — L89312 Pressure ulcer of right buttock, stage 2: Secondary | ICD-10-CM | POA: Diagnosis not present

## 2016-09-10 DIAGNOSIS — J449 Chronic obstructive pulmonary disease, unspecified: Secondary | ICD-10-CM | POA: Diagnosis not present

## 2016-09-10 DIAGNOSIS — I4891 Unspecified atrial fibrillation: Secondary | ICD-10-CM | POA: Diagnosis not present

## 2016-09-10 DIAGNOSIS — I10 Essential (primary) hypertension: Secondary | ICD-10-CM | POA: Diagnosis not present

## 2016-09-10 DIAGNOSIS — E119 Type 2 diabetes mellitus without complications: Secondary | ICD-10-CM | POA: Diagnosis not present

## 2016-09-10 DIAGNOSIS — Z7982 Long term (current) use of aspirin: Secondary | ICD-10-CM | POA: Diagnosis not present

## 2016-09-10 NOTE — Telephone Encounter (Signed)
If they need office notes for ins coverage-then she will need to follow up for an appointment (30 min) -they generally require a mobility evaluation in the office for these things  Let her family know  I think she was also seen in the ED for a fall recently so hopefully this equipment will prevent more falls

## 2016-09-10 NOTE — Telephone Encounter (Signed)
Misty with [facility] left v/m requesting cb; received order for high back w/c and gel pad; will need detailed office notes for reason for equipment and also needs order and documentation for totally electric bed.

## 2016-09-11 NOTE — Telephone Encounter (Signed)
Misty wasn't there today Herbert SetaHeather was. Herbert SetaHeather was the one that was working on pt's orders to begin with, Herbert SetaHeather requested that I send pt's last OV note to her to see if that will do, if not she will have the family call and schedule pt an appt

## 2016-09-12 DIAGNOSIS — Z7982 Long term (current) use of aspirin: Secondary | ICD-10-CM | POA: Diagnosis not present

## 2016-09-12 DIAGNOSIS — I4891 Unspecified atrial fibrillation: Secondary | ICD-10-CM | POA: Diagnosis not present

## 2016-09-12 DIAGNOSIS — Z7984 Long term (current) use of oral hypoglycemic drugs: Secondary | ICD-10-CM | POA: Diagnosis not present

## 2016-09-12 DIAGNOSIS — I10 Essential (primary) hypertension: Secondary | ICD-10-CM | POA: Diagnosis not present

## 2016-09-12 DIAGNOSIS — L89312 Pressure ulcer of right buttock, stage 2: Secondary | ICD-10-CM | POA: Diagnosis not present

## 2016-09-12 DIAGNOSIS — J449 Chronic obstructive pulmonary disease, unspecified: Secondary | ICD-10-CM | POA: Diagnosis not present

## 2016-09-12 DIAGNOSIS — E119 Type 2 diabetes mellitus without complications: Secondary | ICD-10-CM | POA: Diagnosis not present

## 2016-09-13 ENCOUNTER — Telehealth: Payer: Self-pay

## 2016-09-13 DIAGNOSIS — F0280 Dementia in other diseases classified elsewhere without behavioral disturbance: Secondary | ICD-10-CM

## 2016-09-13 DIAGNOSIS — G301 Alzheimer's disease with late onset: Principal | ICD-10-CM

## 2016-09-13 DIAGNOSIS — R627 Adult failure to thrive: Secondary | ICD-10-CM

## 2016-09-13 NOTE — Telephone Encounter (Signed)
Tower pt, she is out of the office.[name].[name].Trudie ReedShaunna with [facility] requesting referral for hospice.. Family agrees--- pt is losing weight, loss of appetite and spitting out medications... Please advise

## 2016-09-13 NOTE — Addendum Note (Signed)
Addended by: Eustaquio BoydenGUTIERREZ, Sly Parlee on: 09/13/2016 05:44 PM   Modules accepted: Orders

## 2016-09-13 NOTE — Telephone Encounter (Signed)
Referral placed. Routed to [place].

## 2016-09-16 ENCOUNTER — Ambulatory Visit: Payer: Medicare Other | Admitting: Family Medicine

## 2016-09-16 DIAGNOSIS — Z7982 Long term (current) use of aspirin: Secondary | ICD-10-CM | POA: Diagnosis not present

## 2016-09-16 DIAGNOSIS — L89312 Pressure ulcer of right buttock, stage 2: Secondary | ICD-10-CM | POA: Diagnosis not present

## 2016-09-16 DIAGNOSIS — E119 Type 2 diabetes mellitus without complications: Secondary | ICD-10-CM | POA: Diagnosis not present

## 2016-09-16 DIAGNOSIS — I4891 Unspecified atrial fibrillation: Secondary | ICD-10-CM | POA: Diagnosis not present

## 2016-09-16 DIAGNOSIS — Z7984 Long term (current) use of oral hypoglycemic drugs: Secondary | ICD-10-CM | POA: Diagnosis not present

## 2016-09-16 DIAGNOSIS — I10 Essential (primary) hypertension: Secondary | ICD-10-CM | POA: Diagnosis not present

## 2016-09-16 DIAGNOSIS — J449 Chronic obstructive pulmonary disease, unspecified: Secondary | ICD-10-CM | POA: Diagnosis not present

## 2016-09-16 NOTE — Telephone Encounter (Signed)
[name] aware of hospice referral

## 2016-09-19 DIAGNOSIS — Z7984 Long term (current) use of oral hypoglycemic drugs: Secondary | ICD-10-CM | POA: Diagnosis not present

## 2016-09-19 DIAGNOSIS — J449 Chronic obstructive pulmonary disease, unspecified: Secondary | ICD-10-CM | POA: Diagnosis not present

## 2016-09-19 DIAGNOSIS — L89312 Pressure ulcer of right buttock, stage 2: Secondary | ICD-10-CM | POA: Diagnosis not present

## 2016-09-19 DIAGNOSIS — I10 Essential (primary) hypertension: Secondary | ICD-10-CM | POA: Diagnosis not present

## 2016-09-19 DIAGNOSIS — E119 Type 2 diabetes mellitus without complications: Secondary | ICD-10-CM | POA: Diagnosis not present

## 2016-09-19 DIAGNOSIS — I4891 Unspecified atrial fibrillation: Secondary | ICD-10-CM | POA: Diagnosis not present

## 2016-09-19 DIAGNOSIS — Z7982 Long term (current) use of aspirin: Secondary | ICD-10-CM | POA: Diagnosis not present

## 2016-09-24 ENCOUNTER — Ambulatory Visit: Payer: Medicare Other | Admitting: Sports Medicine

## 2016-10-15 ENCOUNTER — Other Ambulatory Visit: Payer: Self-pay

## 2016-10-15 NOTE — Patient Outreach (Signed)
[name]  Is at a home facility  Per patient son [name] A Medication Adherence  Call.    Lillia Abed CPhT Pharmacy Technician [facility] Care Management Direct Dial [phone]  Fax [phone] [email or web address]

## 2016-10-17 ENCOUNTER — Telehealth: Payer: Self-pay | Admitting: Family Medicine

## 2016-10-17 NOTE — Telephone Encounter (Signed)
Jazmin from [facility] dropped off forms to be filled out for pt's care plan. Left in RX tower. EH

## 2016-10-18 ENCOUNTER — Telehealth: Payer: Self-pay | Admitting: Internal Medicine

## 2016-10-18 NOTE — Telephone Encounter (Signed)
That is a rip-off to administer neb for $900/month. Please let [facility] know  Dr. Kalman Shan, M.D., [facility].C.P Pulmonary and Critical Care Medicine Staff Physician [facility] Pager: [phone], If no answer or between  15:00h - 7:00h: call [phone]  10/18/2016 5:27 PM

## 2016-10-18 NOTE — Telephone Encounter (Signed)
Called and spoke with [name] and she stated that the pt is currently living in the [facility] facility.  She stated that they have stopped the pts inhalers due to the pt not being able to follow the commands to take the meds.  The started the pt on the neb tx TID, but the facility is now going to charge the family $900 per month to administer these meds to her TID. The family is requesting that this med be changed to PRN only.  MR please advise. Thanks  Allergies  Allergen Reactions  . Azithromycin Other (See Comments)    Reaction:  Unknown   . Codeine Itching  . Coumadin [Warfarin] Other (See Comments)    Reaction:  GI bleeding   . Oxybutynin Chloride Er Other (See Comments)    Reaction:  Constipation, dizziness, dry mouth

## 2016-10-18 NOTE — Telephone Encounter (Signed)
Order has been faxed to [facility] to update the medication.  MR---the medication is not $900, the facility is charging the family this amount to administer this medication to the pt every month.  FYI

## 2016-10-18 NOTE — Telephone Encounter (Signed)
Done and in IN box 

## 2016-10-18 NOTE — Telephone Encounter (Signed)
Not making sense why duoneb should be $900/month.  Hospice should pay for it if she is in hospice even if hospice is in [facility]. And medicare part b should only be $15 through DME.  Can be made prn but really not understanding this $ bad deal she has  Dr. Kalman Shan, M.D., [facility].C.P Pulmonary and Critical Care Medicine Staff Physician [facility]monary and Critical Care Pager: [phone], If no answer or between  15:00h - 7:00h: call [phone]  10/18/2016 4:23 PM

## 2016-10-18 NOTE — Telephone Encounter (Signed)
Spoke with [name] from [facility] [phone] states she is not able to inhale her Spiriva so the hospice nurse Misty Stanley) spoke with pt's pcp who recommended the nebulizer solution instead. The hospice nurse is suppose to contact us on Monday to discuss this further.

## 2016-10-18 NOTE — Telephone Encounter (Signed)
Form in your inbox 

## 2016-10-21 NOTE — Telephone Encounter (Signed)
Lm for [facility] to return our call.

## 2016-10-21 NOTE — Telephone Encounter (Signed)
Per Herbert Seta form faxed back to [facility] also faxed an updated FL2 form along with the care plan per Heather's request

## 2016-10-22 NOTE — Telephone Encounter (Signed)
[facility] returning call.Caren Griffins

## 2016-10-22 NOTE — Telephone Encounter (Signed)
atc [facility] back, was on hold for several minutes and then line was disconnected. Since pt's residence is in [place], atc [facility], who verified that pt is not a patient of theirs.   We need to figure out which hospice the pt is under the care of.  wcb to [facility] to figure out hospice.

## 2016-10-23 NOTE — Telephone Encounter (Signed)
Misty called from [facility] - trying to get clarification on orders sent. She is asking we call back Domingo Sep at [phone] and she is the only nurse that can take verbal orders -pr

## 2016-10-23 NOTE — Telephone Encounter (Signed)
lmtcb

## 2016-10-24 NOTE — Telephone Encounter (Signed)
Called [facility] and spoke to Kula and was advised they can do pt's Duoneb at prn frequency and the $900/mmonth charge will be dropped. Artist Pais is needing directions on how often (I.e. q4h prn or q6h prn). This rx will need to be faxed to [facility] at [phone].   MR please advise the directions for pt's prn Duoneb. Thanks.

## 2016-10-25 MED ORDER — IPRATROPIUM-ALBUTEROL 0.5-2.5 (3) MG/3ML IN SOLN: 3.0000 mL | RESPIRATORY_TRACT | 1 refills | Status: AC | PRN

## 2016-10-25 NOTE — Telephone Encounter (Signed)
[name] calling back regarding RX for Duoneb.  Please fax to [phone] and this will come directly to her.

## 2016-10-25 NOTE — Telephone Encounter (Signed)
Called [facility] and Artist Pais was on another call but they did verify that [phone] was the best fax number. The rx was printed and faxed today. Nothing further is needed.

## 2016-10-25 NOTE — Telephone Encounter (Signed)
duoneb q4h prn  Dr. Kalman Shan, M.D., [facility].C.P Pulmonary and Critical Care Medicine Staff Physician [facility] Pager: [phone], If no answer or between  15:00h - 7:00h: call [phone]  10/25/2016 11:47 AM

## 2016-11-01 ENCOUNTER — Telehealth: Payer: Self-pay | Admitting: Family Medicine

## 2016-11-01 NOTE — Telephone Encounter (Signed)
[name] notified form ready for pick up

## 2016-11-01 NOTE — Telephone Encounter (Signed)
Forms in your inbox

## 2016-11-01 NOTE — Telephone Encounter (Signed)
Jazmin from [facility] dropped off forms to be filled out. Placed in RX tower

## 2016-11-01 NOTE — Telephone Encounter (Signed)
Done and in IN box 

## 2017-01-08 ENCOUNTER — Encounter: Payer: Medicare Other | Admitting: Family Medicine

## 2017-03-17 DIAGNOSIS — B351 Tinea unguium: Secondary | ICD-10-CM | POA: Diagnosis not present

## 2017-03-17 DIAGNOSIS — Q845 Enlarged and hypertrophic nails: Secondary | ICD-10-CM | POA: Diagnosis not present

## 2017-03-17 DIAGNOSIS — L603 Nail dystrophy: Secondary | ICD-10-CM | POA: Diagnosis not present

## 2017-03-17 DIAGNOSIS — M79672 Pain in left foot: Secondary | ICD-10-CM | POA: Diagnosis not present

## 2017-03-17 DIAGNOSIS — M79671 Pain in right foot: Secondary | ICD-10-CM | POA: Diagnosis not present

## 2017-04-23 ENCOUNTER — Telehealth: Payer: Self-pay | Admitting: Family Medicine

## 2017-04-23 NOTE — Telephone Encounter (Signed)
[facility] request cb ASAP.

## 2017-04-23 NOTE — Telephone Encounter (Signed)
Copied from CRM #[id number]. Topic: Inquiry >> Apr 23, 2017  1:41 PM Alexander BergeronBarksdale, Harvey B wrote: Reason for CRM: Mrs. Adrian BlackwaterStinson is the [facility] coordinator [facility] is needing an order from Dr. Milinda Antisower stating that the pt is needing her meats cut up in order to eat instead of having finger food on her diet order  Please ok that verbal order  Thanks

## 2017-04-23 NOTE — Telephone Encounter (Addendum)
Called [facility] but [name] wasn't available, they will have her call back, created a CRM requesting call center to have her fax over dietary changes, I don't think we can give a verbal order for this

## 2017-06-16 DIAGNOSIS — M2010 Hallux valgus (acquired), unspecified foot: Secondary | ICD-10-CM | POA: Diagnosis not present

## 2017-06-16 DIAGNOSIS — B351 Tinea unguium: Secondary | ICD-10-CM | POA: Diagnosis not present

## 2017-06-16 DIAGNOSIS — L603 Nail dystrophy: Secondary | ICD-10-CM | POA: Diagnosis not present

## 2017-06-16 DIAGNOSIS — I739 Peripheral vascular disease, unspecified: Secondary | ICD-10-CM | POA: Diagnosis not present

## 2017-06-16 DIAGNOSIS — Q845 Enlarged and hypertrophic nails: Secondary | ICD-10-CM | POA: Diagnosis not present

## 2017-09-15 DIAGNOSIS — L603 Nail dystrophy: Secondary | ICD-10-CM | POA: Diagnosis not present

## 2017-09-15 DIAGNOSIS — Q845 Enlarged and hypertrophic nails: Secondary | ICD-10-CM | POA: Diagnosis not present

## 2017-09-15 DIAGNOSIS — I739 Peripheral vascular disease, unspecified: Secondary | ICD-10-CM | POA: Diagnosis not present

## 2017-09-15 DIAGNOSIS — B351 Tinea unguium: Secondary | ICD-10-CM | POA: Diagnosis not present

## 2017-10-14 ENCOUNTER — Telehealth: Payer: Self-pay | Admitting: Family Medicine

## 2017-10-15 NOTE — Telephone Encounter (Signed)
Chart updated

## 2017-10-29 NOTE — Telephone Encounter (Signed)
Copied from CRM [phone]. Topic: Quick Communication - See Telephone Encounter >> Oct 14, 2017  3:24 PM Floria RavelingStovall, Shana A wrote: CRM for notification. See Telephone encounter for: Jan 21, 2018. Karel, from hospice called and stated that pt has expired  Today at 5:45 am

## 2017-10-29 NOTE — Telephone Encounter (Signed)
Thanks for letting me know The death cert is in IN box

## 2017-10-29 DEATH — deceased
